# Patient Record
Sex: Female | Born: 1945 | Race: White | Hispanic: No | Smoking: Current every day smoker
Health system: Southern US, Community
[De-identification: ages and names within clinical notes are randomized; demographics above are authoritative.]

## PROBLEM LIST (undated history)

## (undated) DIAGNOSIS — M8580 Other specified disorders of bone density and structure, unspecified site: Secondary | ICD-10-CM

## (undated) DIAGNOSIS — K5730 Diverticulosis of large intestine without perforation or abscess without bleeding: Secondary | ICD-10-CM

## (undated) DIAGNOSIS — M1990 Unspecified osteoarthritis, unspecified site: Secondary | ICD-10-CM

## (undated) DIAGNOSIS — J449 Chronic obstructive pulmonary disease, unspecified: Secondary | ICD-10-CM

## (undated) DIAGNOSIS — Z01419 Encounter for gynecological examination (general) (routine) without abnormal findings: Secondary | ICD-10-CM

## (undated) DIAGNOSIS — K635 Polyp of colon: Secondary | ICD-10-CM

## (undated) DIAGNOSIS — T7840XA Allergy, unspecified, initial encounter: Secondary | ICD-10-CM

## (undated) DIAGNOSIS — Z5189 Encounter for other specified aftercare: Secondary | ICD-10-CM

## (undated) DIAGNOSIS — F419 Anxiety disorder, unspecified: Secondary | ICD-10-CM

## (undated) DIAGNOSIS — E785 Hyperlipidemia, unspecified: Secondary | ICD-10-CM

## (undated) DIAGNOSIS — F32A Depression, unspecified: Secondary | ICD-10-CM

## (undated) DIAGNOSIS — K219 Gastro-esophageal reflux disease without esophagitis: Secondary | ICD-10-CM

## (undated) DIAGNOSIS — F329 Major depressive disorder, single episode, unspecified: Secondary | ICD-10-CM

## (undated) HISTORY — DX: Hyperlipidemia, unspecified: E78.5

## (undated) HISTORY — DX: Encounter for gynecological examination (general) (routine) without abnormal findings: Z01.419

## (undated) HISTORY — DX: Anxiety disorder, unspecified: F41.9

## (undated) HISTORY — DX: Diverticulosis of large intestine without perforation or abscess without bleeding: K57.30

## (undated) HISTORY — DX: Other specified disorders of bone density and structure, unspecified site: M85.80

## (undated) HISTORY — DX: Polyp of colon: K63.5

## (undated) HISTORY — DX: Encounter for other specified aftercare: Z51.89

## (undated) HISTORY — PX: POLYPECTOMY: SHX149

## (undated) HISTORY — DX: Gastro-esophageal reflux disease without esophagitis: K21.9

## (undated) HISTORY — DX: Allergy, unspecified, initial encounter: T78.40XA

## (undated) HISTORY — DX: Unspecified osteoarthritis, unspecified site: M19.90

## (undated) HISTORY — DX: Depression, unspecified: F32.A

## (undated) HISTORY — DX: Major depressive disorder, single episode, unspecified: F32.9

---

## 1999-03-26 ENCOUNTER — Other Ambulatory Visit: Admission: RE | Admit: 1999-03-26 | Discharge: 1999-03-26 | Payer: Self-pay | Admitting: Obstetrics & Gynecology

## 2000-06-15 ENCOUNTER — Other Ambulatory Visit: Admission: RE | Admit: 2000-06-15 | Discharge: 2000-06-15 | Payer: Self-pay | Admitting: Obstetrics & Gynecology

## 2001-11-16 ENCOUNTER — Encounter: Admission: RE | Admit: 2001-11-16 | Discharge: 2001-11-16 | Payer: Self-pay | Admitting: Obstetrics & Gynecology

## 2001-11-16 ENCOUNTER — Encounter: Payer: Self-pay | Admitting: Obstetrics & Gynecology

## 2002-01-02 ENCOUNTER — Other Ambulatory Visit: Admission: RE | Admit: 2002-01-02 | Discharge: 2002-01-02 | Payer: Self-pay | Admitting: Obstetrics & Gynecology

## 2002-12-14 ENCOUNTER — Encounter: Admission: RE | Admit: 2002-12-14 | Discharge: 2002-12-14 | Payer: Self-pay | Admitting: Obstetrics & Gynecology

## 2002-12-14 ENCOUNTER — Encounter: Payer: Self-pay | Admitting: Obstetrics & Gynecology

## 2003-01-17 ENCOUNTER — Other Ambulatory Visit: Admission: RE | Admit: 2003-01-17 | Discharge: 2003-01-17 | Payer: Self-pay | Admitting: Obstetrics & Gynecology

## 2003-12-16 ENCOUNTER — Other Ambulatory Visit: Admission: RE | Admit: 2003-12-16 | Discharge: 2003-12-16 | Payer: Self-pay | Admitting: *Deleted

## 2004-01-03 ENCOUNTER — Encounter: Admission: RE | Admit: 2004-01-03 | Discharge: 2004-01-03 | Payer: Self-pay | Admitting: *Deleted

## 2005-01-07 ENCOUNTER — Other Ambulatory Visit: Admission: RE | Admit: 2005-01-07 | Discharge: 2005-01-07 | Payer: Self-pay | Admitting: *Deleted

## 2005-01-28 ENCOUNTER — Encounter: Admission: RE | Admit: 2005-01-28 | Discharge: 2005-01-28 | Payer: Self-pay | Admitting: *Deleted

## 2006-02-25 ENCOUNTER — Encounter: Admission: RE | Admit: 2006-02-25 | Discharge: 2006-02-25 | Payer: Self-pay | Admitting: *Deleted

## 2006-07-07 ENCOUNTER — Ambulatory Visit: Payer: Self-pay | Admitting: Family Medicine

## 2006-07-07 ENCOUNTER — Encounter: Admission: RE | Admit: 2006-07-07 | Discharge: 2006-07-07 | Payer: Self-pay | Admitting: Family Medicine

## 2006-07-12 ENCOUNTER — Ambulatory Visit: Payer: Self-pay | Admitting: Family Medicine

## 2007-04-03 ENCOUNTER — Other Ambulatory Visit: Admission: RE | Admit: 2007-04-03 | Discharge: 2007-04-03 | Payer: Self-pay | Admitting: *Deleted

## 2007-04-13 ENCOUNTER — Encounter: Admission: RE | Admit: 2007-04-13 | Discharge: 2007-04-13 | Payer: Self-pay | Admitting: *Deleted

## 2007-07-04 ENCOUNTER — Ambulatory Visit: Payer: Self-pay | Admitting: Family Medicine

## 2007-07-04 DIAGNOSIS — K219 Gastro-esophageal reflux disease without esophagitis: Secondary | ICD-10-CM | POA: Insufficient documentation

## 2007-07-06 ENCOUNTER — Telehealth: Payer: Self-pay | Admitting: Family Medicine

## 2007-08-17 ENCOUNTER — Ambulatory Visit: Payer: Self-pay | Admitting: Family Medicine

## 2007-08-17 LAB — CONVERTED CEMR LAB
Bilirubin Urine: NEGATIVE
Glucose, Urine, Semiquant: NEGATIVE
Ketones, urine, test strip: NEGATIVE
Nitrite: NEGATIVE
Protein, U semiquant: NEGATIVE
Specific Gravity, Urine: 1.02
Urobilinogen, UA: 0.2
pH: 6

## 2007-08-18 LAB — CONVERTED CEMR LAB
ALT: 9 units/L (ref 0–35)
AST: 14 units/L (ref 0–37)
Albumin: 4 g/dL (ref 3.5–5.2)
Alkaline Phosphatase: 86 units/L (ref 39–117)
BUN: 14 mg/dL (ref 6–23)
Basophils Absolute: 0 10*3/uL (ref 0.0–0.1)
Basophils Relative: 0.2 % (ref 0.0–1.0)
Bilirubin, Direct: 0.1 mg/dL (ref 0.0–0.3)
CO2: 27 meq/L (ref 19–32)
Calcium: 9.5 mg/dL (ref 8.4–10.5)
Chloride: 108 meq/L (ref 96–112)
Cholesterol: 281 mg/dL (ref 0–200)
Creatinine, Ser: 1 mg/dL (ref 0.4–1.2)
Direct LDL: 208.4 mg/dL
Eosinophils Absolute: 0.2 10*3/uL (ref 0.0–0.6)
Eosinophils Relative: 3 % (ref 0.0–5.0)
GFR calc Af Amer: 73 mL/min
GFR calc non Af Amer: 60 mL/min
Glucose, Bld: 95 mg/dL (ref 70–99)
HCT: 42.3 % (ref 36.0–46.0)
HDL: 33.2 mg/dL — ABNORMAL LOW (ref >39.0)
Hemoglobin: 14.4 g/dL (ref 12.0–15.0)
Lymphocytes Relative: 36.1 % (ref 12.0–46.0)
MCHC: 34 g/dL (ref 30.0–36.0)
MCV: 93.7 fL (ref 78.0–100.0)
Monocytes Absolute: 0.7 10*3/uL (ref 0.2–0.7)
Monocytes Relative: 9.1 % (ref 3.0–11.0)
Neutro Abs: 4.3 10*3/uL (ref 1.4–7.7)
Neutrophils Relative %: 51.6 % (ref 43.0–77.0)
Platelets: 244 10*3/uL (ref 150–400)
Potassium: 3.8 meq/L (ref 3.5–5.1)
RBC: 4.52 M/uL (ref 3.87–5.11)
RDW: 12.4 % (ref 11.5–14.6)
Sodium: 140 meq/L (ref 135–145)
TSH: 3.17 microintl units/mL (ref 0.35–5.50)
Total Bilirubin: 0.7 mg/dL (ref 0.3–1.2)
Total CHOL/HDL Ratio: 8.5
Total Protein: 6.2 g/dL (ref 6.0–8.3)
Triglycerides: 223 mg/dL (ref 0–149)
VLDL: 45 mg/dL — ABNORMAL HIGH (ref 0–40)
WBC: 8.2 10*3/uL (ref 4.5–10.5)

## 2007-08-22 ENCOUNTER — Ambulatory Visit: Payer: Self-pay | Admitting: Family Medicine

## 2007-08-22 DIAGNOSIS — F339 Major depressive disorder, recurrent, unspecified: Secondary | ICD-10-CM | POA: Insufficient documentation

## 2007-08-22 DIAGNOSIS — F329 Major depressive disorder, single episode, unspecified: Secondary | ICD-10-CM

## 2007-09-11 ENCOUNTER — Ambulatory Visit: Payer: Self-pay | Admitting: Gastroenterology

## 2007-09-25 ENCOUNTER — Encounter: Payer: Self-pay | Admitting: Gastroenterology

## 2007-09-25 ENCOUNTER — Ambulatory Visit: Payer: Self-pay | Admitting: Gastroenterology

## 2007-09-25 ENCOUNTER — Encounter: Payer: Self-pay | Admitting: Internal Medicine

## 2007-09-25 ENCOUNTER — Encounter: Payer: Self-pay | Admitting: Family Medicine

## 2007-09-25 DIAGNOSIS — K5730 Diverticulosis of large intestine without perforation or abscess without bleeding: Secondary | ICD-10-CM

## 2007-09-25 DIAGNOSIS — K635 Polyp of colon: Secondary | ICD-10-CM

## 2007-09-25 HISTORY — DX: Diverticulosis of large intestine without perforation or abscess without bleeding: K57.30

## 2007-09-25 HISTORY — DX: Polyp of colon: K63.5

## 2007-11-15 ENCOUNTER — Ambulatory Visit: Payer: Self-pay | Admitting: Family Medicine

## 2008-01-03 ENCOUNTER — Ambulatory Visit: Payer: Self-pay | Admitting: Family Medicine

## 2008-01-03 LAB — CONVERTED CEMR LAB
ALT: 9 units/L (ref 0–35)
AST: 14 units/L (ref 0–37)
Albumin: 3.9 g/dL (ref 3.5–5.2)
Alkaline Phosphatase: 82 units/L (ref 39–117)
Bilirubin, Direct: 0.2 mg/dL (ref 0.0–0.3)
Cholesterol: 178 mg/dL (ref 0–200)
HDL: 33.2 mg/dL — ABNORMAL LOW (ref >39.0)
LDL Cholesterol: 127 mg/dL — ABNORMAL HIGH (ref 0–99)
Total Bilirubin: 0.5 mg/dL (ref 0.3–1.2)
Total CHOL/HDL Ratio: 5.4
Total Protein: 6.3 g/dL (ref 6.0–8.3)
Triglycerides: 89 mg/dL (ref 0–149)
VLDL: 18 mg/dL (ref 0–40)

## 2008-04-01 ENCOUNTER — Ambulatory Visit: Payer: Self-pay | Admitting: Family Medicine

## 2008-04-04 LAB — CONVERTED CEMR LAB
ALT: 9 units/L (ref 0–35)
AST: 12 units/L (ref 0–37)
Albumin: 3.7 g/dL (ref 3.5–5.2)
Alkaline Phosphatase: 74 units/L (ref 39–117)
Bilirubin, Direct: 0.1 mg/dL (ref 0.0–0.3)
Cholesterol: 168 mg/dL (ref 0–200)
HDL: 33.1 mg/dL — ABNORMAL LOW (ref >39.0)
LDL Cholesterol: 112 mg/dL — ABNORMAL HIGH (ref 0–99)
Total Bilirubin: 0.6 mg/dL (ref 0.3–1.2)
Total CHOL/HDL Ratio: 5.1
Total Protein: 6 g/dL (ref 6.0–8.3)
Triglycerides: 113 mg/dL (ref 0–149)
VLDL: 23 mg/dL (ref 0–40)

## 2008-04-17 ENCOUNTER — Encounter: Admission: RE | Admit: 2008-04-17 | Discharge: 2008-04-17 | Payer: Self-pay | Admitting: Gynecology

## 2008-07-16 ENCOUNTER — Other Ambulatory Visit: Admission: RE | Admit: 2008-07-16 | Discharge: 2008-07-16 | Payer: Self-pay | Admitting: Gynecology

## 2008-07-19 ENCOUNTER — Ambulatory Visit: Payer: Self-pay | Admitting: Family Medicine

## 2009-04-30 ENCOUNTER — Encounter: Admission: RE | Admit: 2009-04-30 | Discharge: 2009-04-30 | Payer: Self-pay | Admitting: Gynecology

## 2009-07-25 ENCOUNTER — Ambulatory Visit: Payer: Self-pay | Admitting: Family Medicine

## 2009-07-25 ENCOUNTER — Telehealth: Payer: Self-pay | Admitting: Family Medicine

## 2009-07-28 LAB — CONVERTED CEMR LAB
Bilirubin Urine: NEGATIVE
Glucose, Urine, Semiquant: NEGATIVE
Ketones, urine, test strip: NEGATIVE
Nitrite: NEGATIVE
Protein, U semiquant: NEGATIVE
Specific Gravity, Urine: 1.02
Urobilinogen, UA: 0.2
pH: 6

## 2009-07-29 LAB — CONVERTED CEMR LAB
ALT: 10 units/L (ref 0–35)
AST: 16 units/L (ref 0–37)
Albumin: 4.3 g/dL (ref 3.5–5.2)
Alkaline Phosphatase: 83 units/L (ref 39–117)
BUN: 16 mg/dL (ref 6–23)
Basophils Absolute: 0 10*3/uL (ref 0.0–0.1)
Basophils Relative: 0.1 % (ref 0.0–3.0)
Bilirubin, Direct: 0 mg/dL (ref 0.0–0.3)
CO2: 29 meq/L (ref 19–32)
Calcium: 9.6 mg/dL (ref 8.4–10.5)
Chloride: 111 meq/L (ref 96–112)
Cholesterol: 194 mg/dL (ref 0–200)
Creatinine, Ser: 1 mg/dL (ref 0.4–1.2)
Eosinophils Absolute: 0.2 10*3/uL (ref 0.0–0.7)
Eosinophils Relative: 2.1 % (ref 0.0–5.0)
GFR calc non Af Amer: 59.9 mL/min (ref >60)
Glucose, Bld: 98 mg/dL (ref 70–99)
HCT: 43.4 % (ref 36.0–46.0)
HDL: 39 mg/dL — ABNORMAL LOW (ref >39.00)
Hemoglobin: 14.9 g/dL (ref 12.0–15.0)
LDL Cholesterol: 118 mg/dL — ABNORMAL HIGH (ref 0–99)
Lymphocytes Relative: 34.6 % (ref 12.0–46.0)
Lymphs Abs: 3 10*3/uL (ref 0.7–4.0)
MCHC: 34.4 g/dL (ref 30.0–36.0)
MCV: 94.4 fL (ref 78.0–100.0)
Monocytes Absolute: 0.7 10*3/uL (ref 0.1–1.0)
Monocytes Relative: 8.1 % (ref 3.0–12.0)
Neutro Abs: 4.9 10*3/uL (ref 1.4–7.7)
Neutrophils Relative %: 55.1 % (ref 43.0–77.0)
Platelets: 221 10*3/uL (ref 150.0–400.0)
Potassium: 4.1 meq/L (ref 3.5–5.1)
RBC: 4.6 M/uL (ref 3.87–5.11)
RDW: 12.3 % (ref 11.5–14.6)
Sodium: 144 meq/L (ref 135–145)
TSH: 3.36 microintl units/mL (ref 0.35–5.50)
Total Bilirubin: 0.9 mg/dL (ref 0.3–1.2)
Total CHOL/HDL Ratio: 5
Total Protein: 7 g/dL (ref 6.0–8.3)
Triglycerides: 185 mg/dL — ABNORMAL HIGH (ref 0.0–149.0)
VLDL: 37 mg/dL (ref 0.0–40.0)
WBC: 8.8 10*3/uL (ref 4.5–10.5)

## 2009-08-05 ENCOUNTER — Ambulatory Visit: Payer: Self-pay | Admitting: Family Medicine

## 2009-08-06 ENCOUNTER — Telehealth: Payer: Self-pay | Admitting: Family Medicine

## 2010-02-23 ENCOUNTER — Telehealth (INDEPENDENT_AMBULATORY_CARE_PROVIDER_SITE_OTHER): Payer: Self-pay | Admitting: *Deleted

## 2010-07-07 ENCOUNTER — Telehealth (INDEPENDENT_AMBULATORY_CARE_PROVIDER_SITE_OTHER): Payer: Self-pay

## 2010-08-28 ENCOUNTER — Ambulatory Visit: Payer: Self-pay | Admitting: Family Medicine

## 2010-08-28 LAB — CONVERTED CEMR LAB
Bilirubin Urine: NEGATIVE
Glucose, Urine, Semiquant: NEGATIVE
Ketones, urine, test strip: NEGATIVE
Nitrite: NEGATIVE
Protein, U semiquant: NEGATIVE
Specific Gravity, Urine: 1.02
Urobilinogen, UA: 0.2
WBC Urine, dipstick: NEGATIVE
pH: 6

## 2010-08-31 LAB — CONVERTED CEMR LAB
ALT: 8 units/L (ref 0–35)
AST: 14 units/L (ref 0–37)
Albumin: 4.2 g/dL (ref 3.5–5.2)
Alkaline Phosphatase: 75 units/L (ref 39–117)
BUN: 16 mg/dL (ref 6–23)
Basophils Absolute: 0 10*3/uL (ref 0.0–0.1)
Basophils Relative: 0.5 % (ref 0.0–3.0)
Bilirubin, Direct: 0.1 mg/dL (ref 0.0–0.3)
CO2: 26 meq/L (ref 19–32)
Calcium: 9.4 mg/dL (ref 8.4–10.5)
Chloride: 104 meq/L (ref 96–112)
Cholesterol: 181 mg/dL (ref 0–200)
Creatinine, Ser: 0.9 mg/dL (ref 0.4–1.2)
Eosinophils Absolute: 0.1 10*3/uL (ref 0.0–0.7)
Eosinophils Relative: 1.8 % (ref 0.0–5.0)
GFR calc non Af Amer: 64.1 mL/min (ref >60)
Glucose, Bld: 84 mg/dL (ref 70–99)
HCT: 42.5 % (ref 36.0–46.0)
HDL: 37.7 mg/dL — ABNORMAL LOW (ref >39.00)
Hemoglobin: 14.4 g/dL (ref 12.0–15.0)
LDL Cholesterol: 111 mg/dL — ABNORMAL HIGH (ref 0–99)
Lymphocytes Relative: 31.4 % (ref 12.0–46.0)
Lymphs Abs: 2.6 10*3/uL (ref 0.7–4.0)
MCHC: 33.9 g/dL (ref 30.0–36.0)
MCV: 96.6 fL (ref 78.0–100.0)
Monocytes Absolute: 0.6 10*3/uL (ref 0.1–1.0)
Monocytes Relative: 7.8 % (ref 3.0–12.0)
Neutro Abs: 4.8 10*3/uL (ref 1.4–7.7)
Neutrophils Relative %: 58.5 % (ref 43.0–77.0)
Platelets: 217 10*3/uL (ref 150.0–400.0)
Potassium: 4.2 meq/L (ref 3.5–5.1)
RBC: 4.4 M/uL (ref 3.87–5.11)
RDW: 13.8 % (ref 11.5–14.6)
Sodium: 139 meq/L (ref 135–145)
TSH: 2.48 microintl units/mL (ref 0.35–5.50)
Total Bilirubin: 0.7 mg/dL (ref 0.3–1.2)
Total CHOL/HDL Ratio: 5
Total Protein: 6.5 g/dL (ref 6.0–8.3)
Triglycerides: 161 mg/dL — ABNORMAL HIGH (ref 0.0–149.0)
VLDL: 32.2 mg/dL (ref 0.0–40.0)
WBC: 8.2 10*3/uL (ref 4.5–10.5)

## 2010-09-09 ENCOUNTER — Ambulatory Visit: Payer: Self-pay | Admitting: Family Medicine

## 2010-09-09 ENCOUNTER — Encounter: Payer: Self-pay | Admitting: Family Medicine

## 2010-09-10 ENCOUNTER — Encounter: Payer: Self-pay | Admitting: Family Medicine

## 2010-09-14 ENCOUNTER — Telehealth: Payer: Self-pay | Admitting: Family Medicine

## 2011-01-05 NOTE — Assessment & Plan Note (Signed)
Summary: CPX-NO PAP//CCM   Vital Signs:  Patient profile:   65 year old female Height:      63.75 inches Weight:      183 pounds BMI:     31.77 Temp:     98.7 degrees F oral Pulse rate:   99 / minute BP sitting:   124 / 90  (left arm) Cuff size:   regular  Vitals Entered By: Alfred Levins, CMA (August 05, 2009 1:23 PM) CC: cpx, no pap   History of Present Illness: 65 yr old female for cpx. Has some questions, mostly about feeling tight and sore around the shoulders and neck. This can keep her awake at night, and sometimes she gets HAs at the back of the head. She has felt a bit mor anxious and depressed lately. Looking at her med list, I think she is taking a 12 hour version of Wellbutrin only once a day.  Allergies (verified): No Known Drug Allergies  Past History:  Past Medical History: GERD Hyperlipidemia Depression sees Dr. Chevis Pretty for GYN exams  Past Surgical History: Denies surgical history colonoscopy 09-25-07 per Dr. Jarold Motto, multiple polyps, repeat in 4 years.   Family History: Reviewed history from 08/22/2007 and no changes required. Family History Breast cancer 1st degree relative <50 Family History of Colon CA 1st degree relative <60 Family History of Stroke F 1st degree relative <60 kidney stones  Social History: Reviewed history from 08/22/2007 and no changes required. Occupation: receptionist Married Current Smoker Alcohol use-yes  Review of Systems  The patient denies anorexia, fever, weight loss, weight gain, vision loss, decreased hearing, hoarseness, chest pain, syncope, dyspnea on exertion, peripheral edema, prolonged cough, headaches, hemoptysis, abdominal pain, melena, hematochezia, severe indigestion/heartburn, hematuria, incontinence, genital sores, muscle weakness, suspicious skin lesions, transient blindness, difficulty walking, unusual weight change, abnormal bleeding, enlarged lymph nodes, angioedema, breast masses, and testicular masses.     Physical Exam  General:  overweight-appearing.   Head:  Normocephalic and atraumatic without obvious abnormalities. No apparent alopecia or balding. Eyes:  No corneal or conjunctival inflammation noted. EOMI. Perrla. Funduscopic exam benign, without hemorrhages, exudates or papilledema. Vision grossly normal. Ears:  External ear exam shows no significant lesions or deformities.  Otoscopic examination reveals clear canals, tympanic membranes are intact bilaterally without bulging, retraction, inflammation or discharge. Hearing is grossly normal bilaterally. Nose:  External nasal examination shows no deformity or inflammation. Nasal mucosa are pink and moist without lesions or exudates. Mouth:  Oral mucosa and oropharynx without lesions or exudates.  Teeth in good repair. Neck:  No deformities, masses, or tenderness noted. Chest Wall:  No deformities, masses, or tenderness noted. Lungs:  Normal respiratory effort, chest expands symmetrically. Lungs are clear to auscultation, no crackles or wheezes. Heart:  Normal rate and regular rhythm. S1 and S2 normal without gallop, murmur, click, rub or other extra sounds. EKG normal Abdomen:  Bowel sounds positive,abdomen soft and non-tender without masses, organomegaly or hernias noted. Msk:  No deformity or scoliosis noted of thoracic or lumbar spine.   Pulses:  R and L carotid,radial,femoral,dorsalis pedis and posterior tibial pulses are full and equal bilaterally Extremities:  No clubbing, cyanosis, edema, or deformity noted with normal full range of motion of all joints.   Neurologic:  No cranial nerve deficits noted. Station and gait are normal. Plantar reflexes are down-going bilaterally. DTRs are symmetrical throughout. Sensory, motor and coordinative functions appear intact. Skin:  Intact without suspicious lesions or rashes Cervical Nodes:  No lymphadenopathy noted Axillary Nodes:  No palpable lymphadenopathy Inguinal Nodes:  No significant  adenopathy Psych:  Cognition and judgment appear intact. Alert and cooperative with normal attention span and concentration. No apparent delusions, illusions, hallucinations   Impression & Recommendations:  Problem # 1:  EXAMINATION, ROUTINE MEDICAL (ICD-V70.0)  Orders: EKG w/ Interpretation (93000)  Complete Medication List: 1)  Multivitamins Tabs (Multiple vitamin) .Marland Kitchen.. 1 by mouth once daily 2)  Zocor 80 Mg Tabs (Simvastatin) .Marland Kitchen.. 1 by mouth once daily 3)  Bupropion Hcl 150 Mg Xr24h-tab (Bupropion hcl) .... Once daily  Patient Instructions: 1)  Increase Wellbutrin to a full 24 hour dosing.  2)  Please schedule a follow-up appointment as needed .  Prescriptions: BUPROPION HCL 150 MG XR24H-TAB (BUPROPION HCL) once daily  #30 x 11   Entered and Authorized by:   [name] MD   Signed by:   [name] MD on 08/05/2009   Method used:   Electronically to        [facility] # [phone]* (retail)       [address]       [place], [place]  [address]       Ph: [phone]       Fax: [phone]   RxID:   [id number] ZOCOR 80 MG TABS (SIMVASTATIN) 1 by mouth once daily  #30 x 11   Entered and Authorized by:   [name] MD   Signed by:   [name] MD on 08/05/2009   Method used:   Electronically to        [facility] # [phone]* (retail)       [address]       [place], [place]  [address]       Ph: [phone]       Fax: [phone]   RxID:   [id number]

## 2011-01-05 NOTE — Progress Notes (Signed)
Summary: refill   Phone Note Refill Request Message from:  Patient--live call  Refills Requested: Medication #1:  BUPROPION HCL 150 MG XR24H-TAB once daily   Dosage confirmed as above?Dosage Confirmed refill at [facility]. cpx is on 08-18-2010  Initial call taken by: Warnell Forester,  July 07, 2010 10:18 AM    Prescriptions: BUPROPION HCL 150 MG XR24H-TAB (BUPROPION HCL) once daily  #30 x 1   Entered by:   Kathrynn Speed CMA   Authorized by:   [name] MD   Signed by:   Kathrynn Speed CMA on 07/07/2010   Method used:   Electronically to        [facility] # [phone]* (retail)       [address]       [place], [place]  [address]       Ph: [phone]       Fax: [phone]   RxID:   [id number]

## 2011-01-05 NOTE — Procedures (Signed)
Summary: colonoscopy  colonoscopy   Imported By: Kassie Mends 10/06/2007 15:21:04  _____________________________________________________________________  External Attachment:    Type:   Image     Comment:   colonoscopy

## 2011-01-05 NOTE — Assessment & Plan Note (Signed)
Summary: CPX/LAB   Vital Signs:  Patient Profile:   65 Years Old Female Height:     64.5 inches (163.83 cm) Weight:      187 pounds (85 kg) Temp:     98.9 degrees F (37.17 degrees C) oral Pulse rate:   100 / minute Pulse rhythm:   regular BP sitting:   122 / 76  (left arm) Cuff size:   regular  Vitals Entered By: Alfred Levins, CMA (August 22, 2007 8:58 AM)                 Chief Complaint:  cpx and no pap.  History of Present Illness: 65 yr old female for cpx. Sees Dr. Randell Patient for gyn exams. Doing well. Started Simvastatin for chol. and takes Omeprazole for GERD. This helps, but still gets breakthrough reflux at times. Has been on Wellbutrin for 2 months for depression. It helps some, but she would like to up the dose.  Current Allergies: No known allergies   Past Medical History:    Reviewed history from 07/04/2007 and no changes required:       GERD       Hyperlipidemia       Depression  Past Surgical History:    Reviewed history from 07/04/2007 and no changes required:       Denies surgical history   Family History:    Reviewed history and no changes required:       Family History Breast cancer 1st degree relative <50       Family History of Colon CA 1st degree relative <60       Family History of Stroke F 1st degree relative <60       kidney stones  Social History:    Reviewed history and no changes required:       Occupation: receptionist       Married       Current Smoker       Alcohol use-yes   Risk Factors:  Tobacco use:  current    Cigarettes:  Yes -- 1/2 pack(s) per day Alcohol use:  yes   Review of Systems  The patient denies anorexia, fever, weight loss, vision loss, decreased hearing, hoarseness, chest pain, syncope, dyspnea on exhertion, peripheral edema, prolonged cough, hemoptysis, abdominal pain, melena, hematochezia, severe indigestion/heartburn, hematuria, incontinence, genital sores, muscle weakness, suspicious skin lesions,  transient blindness, difficulty walking, depression, unusual weight change, abnormal bleeding, enlarged lymph nodes, angioedema, breast masses, and testicular masses.     Physical Exam  General:     overweight-appearing.   Head:     Normocephalic and atraumatic without obvious abnormalities. No apparent alopecia or balding. Eyes:     No corneal or conjunctival inflammation noted. EOMI. Perrla. Funduscopic exam benign, without hemorrhages, exudates or papilledema. Vision grossly normal. Ears:     External ear exam shows no significant lesions or deformities.  Otoscopic examination reveals clear canals, tympanic membranes are intact bilaterally without bulging, retraction, inflammation or discharge. Hearing is grossly normal bilaterally. Nose:     External nasal examination shows no deformity or inflammation. Nasal mucosa are pink and moist without lesions or exudates. Mouth:     Oral mucosa and oropharynx without lesions or exudates.  Teeth in good repair. Neck:     No deformities, masses, or tenderness noted. Chest Wall:     No deformities, masses, or tenderness noted. Lungs:     Normal respiratory effort, chest expands symmetrically. Lungs are clear to auscultation, no  crackles or wheezes. Heart:     Normal rate and regular rhythm. S1 and S2 normal without gallop, murmur, click, rub or other extra sounds.EKG normal Abdomen:     Bowel sounds positive,abdomen soft and non-tender without masses, organomegaly or hernias noted. Msk:     No deformity or scoliosis noted of thoracic or lumbar spine.   Pulses:     R and L carotid,radial,femoral,dorsalis pedis and posterior tibial pulses are full and equal bilaterally Extremities:     No clubbing, cyanosis, edema, or deformity noted with normal full range of motion of all joints.   Neurologic:     No cranial nerve deficits noted. Station and gait are normal. Plantar reflexes are down-going bilaterally. DTRs are symmetrical throughout.  Sensory, motor and coordinative functions appear intact. Skin:     Intact without suspicious lesions or rashes Cervical Nodes:     No lymphadenopathy noted Axillary Nodes:     No palpable lymphadenopathy Inguinal Nodes:     No significant adenopathy Psych:     Cognition and judgment appear intact. Alert and cooperative with normal attention span and concentration. No apparent delusions, illusions, hallucinations    Impression & Recommendations:  Problem # 1:  EXAMINATION, ROUTINE MEDICAL (ICD-V70.0)  Orders: EKG w/ Interpretation (93000) Gastroenterology Referral (GI)   Complete Medication List: 1)  Multivitamins Tabs (Multiple vitamin) .Marland Kitchen.. 1 by mouth once daily 2)  Zocor 40 Mg Tabs (Simvastatin) .Marland Kitchen.. 1 by mouth once daily 3)  Wellbutrin Sr 150 Mg Tb12 (Bupropion hcl) .... Once daily 4)  Omeprazole 20 Mg Cpdr (Omeprazole) .... 2 once daily   Patient Instructions: 1)  Please schedule a follow-up appointment in 3 months. 2)  Schedule a colonoscopy/sigmoidoscopy to help detect colon cancer.    Prescriptions: OMEPRAZOLE 20 MG  CPDR (OMEPRAZOLE) 2 once daily  #60 x 11   Entered and Authorized by:   [name] MD   Signed by:   [name] MD on 08/22/2007   Method used:   Electronically sent to ...       [facility] # [phone] [facility] [address]       [address]       [place], [place]  [address]       Ph: [phone]       Fax: [phone]   RxID:   [id number] WELLBUTRIN SR 150 MG  TB12 (BUPROPION HCL) once daily  #30 x 11   Entered and Authorized by:   [name] MD   Signed by:   [name] MD on 08/22/2007   Method used:   Electronically sent to ...       [facility] # [address]       [address]       [place], [place]  [address]       Ph: [phone]       Fax: [phone]   RxID:   [id number]  ]

## 2011-01-05 NOTE — Progress Notes (Signed)
Summary: RX REFILL: [facility]  Phone Note Call from Patient Call back at Home Phone [phone]   Caller: PT HERE FOR LAB APPT Summary of Call: NEED RX REFILL OF WELLBUTRIN SR 150MG  IS OUT AND NEEDS ENOUNGH UNTIL SHE COMES IN FOR HER CPX ON 08/05/09.  PLEASE SEND TO [facility] [address]. Initial call taken by: Trixie Dredge,  July 25, 2009 8:51 AM  Follow-up for Phone Call        Phone Call Completed, Rx Called In Follow-up by: Alfred Levins, CMA,  July 25, 2009 9:44 AM    Prescriptions: WELLBUTRIN SR 150 MG  TB12 (BUPROPION HCL) once daily  #30 x 0   Entered by:   Alfred Levins, CMA   Authorized by:   [name] MD   Signed by:   Alfred Levins, CMA on 07/25/2009   Method used:   Electronically to        [facility] # [phone]* (retail)       [address]       [place], [place]  [address]       Ph: [phone]       Fax: [phone]   RxID:   [id number]

## 2011-01-05 NOTE — Assessment & Plan Note (Signed)
Summary: sore throat/ccm   Vital Signs:  Patient Profile:   65 Years Old Female Height:     64.5 inches (163.83 cm) Weight:      179 pounds Temp:     98.6 degrees F oral Pulse rate:   80 / minute Pulse rhythm:   regular BP sitting:   118 / 82  (left arm) Cuff size:   regular  Vitals Entered By: Raechel Ache, RN (July 19, 2008 2:45 PM)                 Chief Complaint:  C/o sore throat, swollen glands, some congestion, and cough and achy. Started Tuesday.Marland Kitchen  History of Present Illness: 4 days of St, chest congestion, and dry cough. No fever.    Current Allergies (reviewed today): No known allergies   Past Medical History:    Reviewed history from 08/22/2007 and no changes required:       GERD       Hyperlipidemia       Depression     Review of Systems      See HPI   Physical Exam  General:     Well-developed,well-nourished,in no acute distress; alert,appropriate and cooperative throughout examination Head:     Normocephalic and atraumatic without obvious abnormalities. No apparent alopecia or balding. Eyes:     No corneal or conjunctival inflammation noted. EOMI. Perrla. Funduscopic exam benign, without hemorrhages, exudates or papilledema. Vision grossly normal. Ears:     External ear exam shows no significant lesions or deformities.  Otoscopic examination reveals clear canals, tympanic membranes are intact bilaterally without bulging, retraction, inflammation or discharge. Hearing is grossly normal bilaterally. Nose:     External nasal examination shows no deformity or inflammation. Nasal mucosa are pink and moist without lesions or exudates. Mouth:     OP red without exudate Neck:     No deformities, masses, or tenderness noted. Lungs:     Normal respiratory effort, chest expands symmetrically. Lungs are clear to auscultation, no crackles or wheezes.    Impression & Recommendations:  Problem # 1:  ACUTE BRONCHITIS (ICD-466.0)  Her updated  medication list for this problem includes:    Zithromax Z-pak 250 Mg Tabs (Azithromycin) .Marland Kitchen... As directed   Complete Medication List: 1)  Multivitamins Tabs (Multiple vitamin) .Marland Kitchen.. 1 by mouth once daily 2)  Zocor 80 Mg Tabs (Simvastatin) .Marland Kitchen.. 1 by mouth once daily 3)  Wellbutrin Sr 150 Mg Tb12 (Bupropion hcl) .... Once daily 4)  Omeprazole 20 Mg Cpdr (Omeprazole) .... 2 once daily 5)  Zithromax Z-pak 250 Mg Tabs (Azithromycin) .... As directed   Patient Instructions: 1)  Please schedule a follow-up appointment as needed.   Prescriptions: WELLBUTRIN SR 150 MG  TB12 (BUPROPION HCL) once daily  #30 x 11   Entered and Authorized by:   [name] MD   Signed by:   [name] MD on 07/19/2008   Method used:   Electronically sent to ...       [facility] # [phone]*       [address]       [place], [place]  [address]       Ph: [phone]       Fax: [phone]   RxID:   [id number] ZITHROMAX Z-PAK 250 MG  TABS (AZITHROMYCIN) as directed  #1 x 0   Entered and Authorized by:   [name] MD   Signed by:   [name] MD  on 07/19/2008   Method used:   Electronically sent to ...       [facility] # [phone]*       [address]       [place], [place]  [address]       Ph: [phone]       Fax: [phone]   RxID:   [id number]  ]

## 2011-01-05 NOTE — Progress Notes (Signed)
Summary: GERD  Phone Note Call from Patient   Call For: Emily Horton Summary of Call: Rx Nexium will cost me $500 for the 90days.  Pharmacist says I can take two Prilosec per day & get same results.  Is this true/OK .  If not can you suggest something else .  The GERD is very bad.  [facility] [place]/ NKDA . call back at [phone] Initial call taken by: Rudy Jew, RN,  July 06, 2007 9:25 AM  Follow-up for Phone Call        call in omeprazole 20mg , take 2 a day, #60 with 11 rf Follow-up by: [name] MD,  July 10, 2007 1:20 PM  Additional Follow-up for Phone Call Additional follow up Details #1::        Phone Call Completed, Rx Called In Additional Follow-up by: Alfred Levins, CMA,  July 10, 2007 3:07 PM

## 2011-01-05 NOTE — Progress Notes (Signed)
Summary: lipitor too expensive  Phone Note Call from Patient Call back at Home Phone [phone]   Caller: vm Call For: doct Annette Bertelson's nurse Summary of Call: Asking for Lipitor discount card or samples or something else/different med.  Cannot afford $375 for 90 day supply.   Initial call taken by: Rudy Jew, RN,  September 14, 2010 12:04 PM  Follow-up for Phone Call        No we do not have discount cards or samples, but it will go generic next month. may be she can just follow a strict diet until she gets it next month Follow-up by: [name] MD,  September 14, 2010 2:09 PM  Additional Follow-up for Phone Call Additional follow up Details #1::        Phone Call Completed Additional Follow-up by: Rudy Jew, RN,  September 14, 2010 2:18 PM

## 2011-01-05 NOTE — Assessment & Plan Note (Signed)
Summary: cpx//ccm/pt rescd//ccm   Vital Signs:  Patient profile:   65 year old female Height:      65 inches Weight:      185 pounds BMI:     30.90 O2 Sat:      98 % Temp:     97.9 degrees F Pulse rate:   80 / minute BP sitting:   124 / 82  (left arm)  Vitals Entered By: Pura Spice, RN (September 09, 2010 2:52 PM) CC: cpx no pap    History of Present Illness: 65 yr old female for a cpx. She feels well and has no concerns.   Allergies (verified): No Known Drug Allergies  Past History:  Past Medical History: Reviewed history from 08/05/2009 and no changes required. GERD Hyperlipidemia Depression sees Dr. Chevis Pretty for GYN exams  Past Surgical History: Reviewed history from 08/05/2009 and no changes required. Denies surgical history colonoscopy 09-25-07 per Dr. Jarold Motto, multiple polyps, repeat in 4 years.   Past History:  Care Management: Gynecology: Dr Chevis Pretty Gastroenterology:  Dr Jarold Motto   Family History: Reviewed history from 08/22/2007 and no changes required. Family History Breast cancer 1st degree relative <50 Family History of Colon CA 1st degree relative <60 Family History of Stroke F 1st degree relative <60 kidney stones  Social History: Reviewed history from 08/22/2007 and no changes required. Occupation: receptionist Married Current Smoker Alcohol use-yes  Review of Systems  The patient denies anorexia, fever, weight loss, weight gain, vision loss, decreased hearing, hoarseness, chest pain, syncope, dyspnea on exertion, peripheral edema, prolonged cough, headaches, hemoptysis, abdominal pain, melena, hematochezia, severe indigestion/heartburn, hematuria, incontinence, genital sores, muscle weakness, suspicious skin lesions, transient blindness, difficulty walking, depression, unusual weight change, abnormal bleeding, enlarged lymph nodes, angioedema, breast masses, and testicular masses.    Physical Exam  General:   Well-developed,well-nourished,in no acute distress; alert,appropriate and cooperative throughout examination Head:  Normocephalic and atraumatic without obvious abnormalities. No apparent alopecia or balding. Eyes:  No corneal or conjunctival inflammation noted. EOMI. Perrla. Funduscopic exam benign, without hemorrhages, exudates or papilledema. Vision grossly normal. Ears:  External ear exam shows no significant lesions or deformities.  Otoscopic examination reveals clear canals, tympanic membranes are intact bilaterally without bulging, retraction, inflammation or discharge. Hearing is grossly normal bilaterally. Nose:  External nasal examination shows no deformity or inflammation. Nasal mucosa are pink and moist without lesions or exudates. Mouth:  Oral mucosa and oropharynx without lesions or exudates.  Teeth in good repair. Neck:  No deformities, masses, or tenderness noted. Chest Wall:  No deformities, masses, or tenderness noted. Lungs:  Normal respiratory effort, chest expands symmetrically. Lungs are clear to auscultation, no crackles or wheezes. Heart:  Normal rate and regular rhythm. S1 and S2 normal without gallop, murmur, click, rub or other extra sounds. EKG normal Abdomen:  Bowel sounds positive,abdomen soft and non-tender without masses, organomegaly or hernias noted. Msk:  No deformity or scoliosis noted of thoracic or lumbar spine.   Pulses:  R and L carotid,radial,femoral,dorsalis pedis and posterior tibial pulses are full and equal bilaterally Extremities:  No clubbing, cyanosis, edema, or deformity noted with normal full range of motion of all joints.   Neurologic:  No cranial nerve deficits noted. Station and gait are normal. Plantar reflexes are down-going bilaterally. DTRs are symmetrical throughout. Sensory, motor and coordinative functions appear intact. Skin:  Intact without suspicious lesions or rashes Cervical Nodes:  No lymphadenopathy noted Axillary Nodes:  No palpable  lymphadenopathy Inguinal Nodes:  No significant  adenopathy Psych:  Cognition and judgment appear intact. Alert and cooperative with normal attention span and concentration. No apparent delusions, illusions, hallucinations   Impression & Recommendations:  Problem # 1:  EXAMINATION, ROUTINE MEDICAL (ICD-V70.0)  Orders: EKG w/ Interpretation (93000)  Complete Medication List: 1)  Multivitamins Tabs (Multiple vitamin) .Marland Kitchen.. 1 by mouth once daily 2)  Bupropion Hcl 150 Mg Xr24h-tab (Bupropion hcl) .... Once daily 3)  Flexeril 10 Mg Tabs (Cyclobenzaprine hcl) .Marland Kitchen.. 1 by mouth three times a day as needed for spasm 4)  Lipitor 40 Mg Tabs (Atorvastatin calcium) .... Once daily 5)  Zoloft 50 Mg Tabs (Sertraline hcl) .... Once daily  Patient Instructions: 1)  It is important that you exercise reguarly at least 20 minutes 5 times a week. If you develop chest pain, have severe difficulty breathing, or feel very tired, stop exercising immediately and seek medical attention.  2)  You need to lose weight. Consider a lower calorie diet and regular exercise.  3)  Switch from Zocor to Lipitor, and recheck labs in 90 days. 4)  Add Zoloft to Wellbutrin daily.  Prescriptions: ZOLOFT 50 MG TABS (SERTRALINE HCL) once daily  #90 x 3   Entered and Authorized by:   [name] MD   Signed by:   [name] MD on 09/09/2010   Method used:   Print then Give to Patient   RxID:   [id number] FLEXERIL 10 MG TABS (CYCLOBENZAPRINE HCL) 1 by mouth three times a day as needed for spasm  #180 x 3   Entered and Authorized by:   [name] MD   Signed by:   [name] MD on 09/09/2010   Method used:   Print then Give to Patient   RxID:   [id number] BUPROPION HCL 150 MG XR24H-TAB (BUPROPION HCL) once daily  #90 x 3   Entered and Authorized by:   [name] MD   Signed by:   [name] MD on 09/09/2010   Method used:   Print then Give to Patient   RxID:   [id number] LIPITOR 40 MG TABS  (ATORVASTATIN CALCIUM) once daily  #90 x 3   Entered and Authorized by:   [name] MD   Signed by:   [name] MD on 09/09/2010   Method used:   Print then Give to Patient   RxID:   [id number]

## 2011-01-05 NOTE — Progress Notes (Signed)
Summary: Call-A-Nurse Report   Call-A-Nurse Triage Call Report Triage Record Num: [id number] Operator: Lelon Perla Patient Name: Emily Horton Call Date & Time: 02/21/2010 1:52:10AM Patient Phone: [phone] PCP: Patient Gender: Female PCP Fax : Patient DOB: [birth year] Practice Name: [facility] Reason for Call: pt calling with chest pain since 9 pm and has had hart burn before but this is worse - if she lays down it is worse and is having hot flashes and has twiching in her shoulder and if she takes a deep breath it hurts in the center of her chest - care advice given and advised to go to [facility] ED Protocol(s) Used: Chest Pain / Discomfort Recommended Outcome per Protocol: See ED Immediately Reason for Outcome: Symptoms worse when lying down AND better when sitting and leaning forward Care Advice:  ~ Another adult should drive. Write down provider's name. List or place the following in a bag for transport with the patient: current prescription and/or OTC medications; alternative treatments, therapies and medications; and street drugs.  ~  ~ Do not eat or drink anything until evaluated by provider.  ~ Inform provider if using any impotence medications (such as Viagra).  ~ Place patient in a position of comfort and loosen tight clothing. Call EMS 911 if chest pain or difficulty breathing worsens or new swelling of lower extremities or gradual abdomen swelling.  ~  ~ IMMEDIATE ACTION 02/21/2010 2:01:25AM Page 1 of 1 [id number]

## 2011-01-05 NOTE — Assessment & Plan Note (Signed)
Summary: chest congestion/jnl   Vital Signs:  Patient Profile:   65 Years Old Female Weight:      80 pounds (36.36 kg) Temp:     98.4 degrees F (36.89 degrees C) oral Pulse rate:   84 / minute BP sitting:   118 / 82  (left arm)  Vitals Entered By: Alfred Levins, CMA (July 04, 2007 10:47 AM)               Chief Complaint:  BURNING STOMACH.  History of Present Illness: For past 3 months she has had frequent heartburn and epigastric pains. Some nausea but no vomiting. BMs OK. Prilosec  helps a little. No SOB.  Current Allergies: No known allergies   Past Medical History:    Reviewed history and no changes required:       GERD  Past Surgical History:    Denies surgical history     Review of Systems      See HPI   Physical Exam  General:     Well-developed,well-nourished,in no acute distress; alert,appropriate and cooperative throughout examination Abdomen:     Bowel sounds positive,abdomen soft and non-tender without masses, organomegaly or hernias noted.    Impression & Recommendations:  Problem # 1:  GERD (ICD-530.81)  The following medications were removed from the medication list:    Prilosec Otc 20 Mg Tbec (Omeprazole magnesium) .Marland Kitchen... 1 by mouth once daily  Her updated medication list for this problem includes:    Nexium 40 Mg Cpdr (Esomeprazole magnesium) ..... Once daily   Medications Added to Medication List This Visit: 1)  Prilosec Otc 20 Mg Tbec (Omeprazole magnesium) .Marland Kitchen.. 1 by mouth once daily 2)  Multivitamins Tabs (Multiple vitamin) .Marland Kitchen.. 1 by mouth once daily 3)  Wellbutrin 75 Mg Tabs (Bupropion hcl) .Marland Kitchen.. 1 by mouth once daily 4)  Nexium 40 Mg Cpdr (Esomeprazole magnesium) .... Once daily   Patient Instructions: 1)  set up cpx soon   Prescriptions: NEXIUM 40 MG  CPDR (ESOMEPRAZOLE MAGNESIUM) once daily  #90 x 3   Entered and Authorized by:   [name] MD   Signed by:   [name] MD on 07/04/2007   Method used:    Electronically sent to ...       [facility] [id number]       [address]       [place], [place]  [address]       Ph: [phone]       Fax: [phone]   RxID:   [id number]

## 2011-09-02 ENCOUNTER — Other Ambulatory Visit (INDEPENDENT_AMBULATORY_CARE_PROVIDER_SITE_OTHER): Payer: BC Managed Care – PPO

## 2011-09-02 DIAGNOSIS — Z0000 Encounter for general adult medical examination without abnormal findings: Secondary | ICD-10-CM

## 2011-09-02 LAB — POCT URINALYSIS DIPSTICK
Bilirubin, UA: NEGATIVE
Glucose, UA: NEGATIVE
Ketones, UA: NEGATIVE
Leukocytes, UA: NEGATIVE
Nitrite, UA: NEGATIVE
Protein, UA: NEGATIVE
Spec Grav, UA: 1.02
Urobilinogen, UA: 0.2
pH, UA: 6.5

## 2011-09-02 LAB — CBC WITH DIFFERENTIAL/PLATELET
Basophils Absolute: 0 10*3/uL (ref 0.0–0.1)
Basophils Relative: 0.4 % (ref 0.0–3.0)
Eosinophils Absolute: 0.2 10*3/uL (ref 0.0–0.7)
Eosinophils Relative: 2 % (ref 0.0–5.0)
HCT: 44.7 % (ref 36.0–46.0)
Hemoglobin: 14.9 g/dL (ref 12.0–15.0)
Lymphocytes Relative: 34.7 % (ref 12.0–46.0)
Lymphs Abs: 2.8 10*3/uL (ref 0.7–4.0)
MCHC: 33.3 g/dL (ref 30.0–36.0)
MCV: 96.2 fl (ref 78.0–100.0)
Monocytes Absolute: 0.6 10*3/uL (ref 0.1–1.0)
Monocytes Relative: 7.7 % (ref 3.0–12.0)
Neutro Abs: 4.5 10*3/uL (ref 1.4–7.7)
Neutrophils Relative %: 55.2 % (ref 43.0–77.0)
Platelets: 226 10*3/uL (ref 150.0–400.0)
RBC: 4.64 Mil/uL (ref 3.87–5.11)
RDW: 13.8 % (ref 11.5–14.6)
WBC: 8.1 10*3/uL (ref 4.5–10.5)

## 2011-09-03 LAB — HEPATIC FUNCTION PANEL
ALT: 12 U/L (ref 0–35)
AST: 15 U/L (ref 0–37)
Albumin: 4.4 g/dL (ref 3.5–5.2)
Alkaline Phosphatase: 78 U/L (ref 39–117)
Bilirubin, Direct: 0.1 mg/dL (ref 0.0–0.3)
Total Bilirubin: 0.6 mg/dL (ref 0.3–1.2)
Total Protein: 6.8 g/dL (ref 6.0–8.3)

## 2011-09-03 LAB — LIPID PANEL
Cholesterol: 197 mg/dL (ref 0–200)
HDL: 47.9 mg/dL (ref >39.00)
LDL Cholesterol: 121 mg/dL — ABNORMAL HIGH (ref 0–99)
Total CHOL/HDL Ratio: 4
Triglycerides: 141 mg/dL (ref 0.0–149.0)
VLDL: 28.2 mg/dL (ref 0.0–40.0)

## 2011-09-03 LAB — BASIC METABOLIC PANEL
BUN: 17 mg/dL (ref 6–23)
CO2: 26 mEq/L (ref 19–32)
Calcium: 9.2 mg/dL (ref 8.4–10.5)
Chloride: 107 mEq/L (ref 96–112)
Creatinine, Ser: 0.9 mg/dL (ref 0.4–1.2)
GFR: 68.95 mL/min (ref >60.00)
Glucose, Bld: 95 mg/dL (ref 70–99)
Potassium: 4 mEq/L (ref 3.5–5.1)
Sodium: 141 mEq/L (ref 135–145)

## 2011-09-03 LAB — TSH: TSH: 3.05 u[IU]/mL (ref 0.35–5.50)

## 2011-09-10 ENCOUNTER — Encounter: Payer: Self-pay | Admitting: Family Medicine

## 2011-09-10 ENCOUNTER — Telehealth: Payer: Self-pay | Admitting: Family Medicine

## 2011-09-10 NOTE — Telephone Encounter (Signed)
Spoke with pt and gave results. 

## 2011-09-10 NOTE — Telephone Encounter (Signed)
Message copied by Baldemar Friday on Fri Sep 10, 2011  3:37 PM ------      Message from: Gershon Crane A      Created: Mon Sep 06, 2011  5:16 AM       Normal except high chol. Watch the diet

## 2011-09-13 ENCOUNTER — Ambulatory Visit (INDEPENDENT_AMBULATORY_CARE_PROVIDER_SITE_OTHER): Payer: BC Managed Care – PPO | Admitting: Family Medicine

## 2011-09-13 ENCOUNTER — Encounter: Payer: Self-pay | Admitting: Family Medicine

## 2011-09-13 VITALS — BP 120/82 | HR 86 | Temp 98.3°F | Ht 64.75 in | Wt 186.0 lb

## 2011-09-13 DIAGNOSIS — Z23 Encounter for immunization: Secondary | ICD-10-CM

## 2011-09-13 DIAGNOSIS — Z2911 Encounter for prophylactic immunotherapy for respiratory syncytial virus (RSV): Secondary | ICD-10-CM

## 2011-09-13 DIAGNOSIS — Z0000 Encounter for general adult medical examination without abnormal findings: Secondary | ICD-10-CM

## 2011-09-13 MED ORDER — ATORVASTATIN CALCIUM 40 MG PO TABS: 40.0000 mg | ORAL_TABLET | Freq: Every day | ORAL | Status: DC

## 2011-09-13 MED ORDER — BUPROPION HCL ER (XL) 300 MG PO TB24: 300.0000 mg | ORAL_TABLET | ORAL | Status: DC

## 2011-09-13 NOTE — Progress Notes (Signed)
Subjective:    Patient ID: Emily Horton, female    DOB: [birth year], 65 y.o.   MRN: [id number]  HPI 65 yr old female for a cpx. He is doing well in general, but he would like to increase his dose of Wellbutrin. He stopped taking Zoloft a long time ago due to an upset stomach, but he still feels stressed often. He sleeps well.  He gets little exercise.    Review of Systems  Constitutional: Negative.  Negative for fever, diaphoresis, activity change, appetite change, fatigue and unexpected weight change.  HENT: Negative.  Negative for hearing loss, ear pain, nosebleeds, congestion, sore throat, trouble swallowing, neck pain, neck stiffness, voice change and tinnitus.   Eyes: Negative.  Negative for photophobia, pain, discharge, redness and visual disturbance.  Respiratory: Negative.  Negative for apnea, cough, choking, chest tightness, shortness of breath, wheezing and stridor.   Cardiovascular: Negative.  Negative for chest pain, palpitations and leg swelling.  Gastrointestinal: Negative.  Negative for nausea, vomiting, abdominal pain, diarrhea, constipation, blood in stool, abdominal distention and rectal pain.  Genitourinary: Negative.  Negative for dysuria, urgency, frequency, hematuria, flank pain, vaginal bleeding, vaginal discharge, enuresis, difficulty urinating, vaginal pain and menstrual problem.  Musculoskeletal: Negative.  Negative for myalgias, back pain, joint swelling, arthralgias and gait problem.  Skin: Negative.  Negative for color change, pallor, rash and wound.  Neurological: Negative.  Negative for dizziness, tremors, seizures, syncope, speech difficulty, weakness, light-headedness, numbness and headaches.  Hematological: Negative.  Negative for adenopathy. Does not bruise/bleed easily.  Psychiatric/Behavioral: Negative.  Negative for hallucinations, behavioral problems, confusion, sleep disturbance, dysphoric mood and agitation. The patient is not nervous/anxious.          Objective:   Physical Exam  Constitutional: She appears well-developed and well-nourished. No distress.  HENT:  Head: Normocephalic and atraumatic.  Right Ear: External ear normal.  Left Ear: External ear normal.  Nose: Nose normal.  Mouth/Throat: Oropharynx is clear and moist. No oropharyngeal exudate.  Eyes: Conjunctivae and EOM are normal. Pupils are equal, round, and reactive to light. Right eye exhibits no discharge. Left eye exhibits no discharge. No scleral icterus.  Neck: Normal range of motion. Neck supple. No JVD present. No thyromegaly present.  Cardiovascular: Normal rate, regular rhythm, normal heart sounds and intact distal pulses.  Exam reveals no gallop and no friction rub.   No murmur heard.      EKG normal   Pulmonary/Chest: Effort normal and breath sounds normal. No stridor. No respiratory distress. She has no wheezes. She has no rales. She exhibits no tenderness.  Abdominal: Soft. Normal appearance and bowel sounds are normal. She exhibits no distension, no abdominal bruit, no ascites and no mass. There is no hepatosplenomegaly. There is no tenderness. There is no rigidity, no rebound and no guarding. No hernia.  Genitourinary: Rectum normal, vagina normal and uterus normal. No breast swelling, tenderness, discharge or bleeding. Cervix exhibits no motion tenderness, no discharge and no friability. Right adnexum displays no mass, no tenderness and no fullness. Left adnexum displays no mass, no tenderness and no fullness. No erythema, tenderness or bleeding around the vagina. No vaginal discharge found.  Musculoskeletal: Normal range of motion. She exhibits no edema and no tenderness.  Lymphadenopathy:    She has no cervical adenopathy.  Neurological: She is alert. She has normal reflexes. No cranial nerve deficit. She exhibits normal muscle tone. Coordination normal.  Skin: Skin is warm and dry. No rash noted. She is not diaphoretic. No erythema.  No pallor.  Psychiatric:  She has a normal mood and affect. Her behavior is normal. Judgment and thought content normal.          Assessment & Plan:  We will increase Wellbutrin to 300 mg a day. Get more exercise

## 2011-09-13 NOTE — Progress Notes (Signed)
Addended by: Aniceto Boss A on: 09/13/2011 02:12 PM   Modules accepted: Orders

## 2011-09-27 ENCOUNTER — Other Ambulatory Visit: Payer: Self-pay | Admitting: Family Medicine

## 2012-06-23 ENCOUNTER — Ambulatory Visit (INDEPENDENT_AMBULATORY_CARE_PROVIDER_SITE_OTHER): Payer: BC Managed Care – PPO | Admitting: Gastroenterology

## 2012-06-23 ENCOUNTER — Encounter: Payer: Self-pay | Admitting: Gastroenterology

## 2012-06-23 VITALS — BP 120/80 | HR 100 | Ht 65.75 in | Wt 188.4 lb

## 2012-06-23 DIAGNOSIS — K219 Gastro-esophageal reflux disease without esophagitis: Secondary | ICD-10-CM

## 2012-06-23 DIAGNOSIS — F341 Dysthymic disorder: Secondary | ICD-10-CM

## 2012-06-23 DIAGNOSIS — R079 Chest pain, unspecified: Secondary | ICD-10-CM

## 2012-06-23 DIAGNOSIS — Z860100 Personal history of colon polyps, unspecified: Secondary | ICD-10-CM

## 2012-06-23 DIAGNOSIS — F418 Other specified anxiety disorders: Secondary | ICD-10-CM

## 2012-06-23 MED ORDER — DEXLANSOPRAZOLE 60 MG PO CPDR: 60.0000 mg | DELAYED_RELEASE_CAPSULE | Freq: Every day | ORAL | Status: DC

## 2012-06-23 MED ORDER — HYOSCYAMINE SULFATE 0.125 MG SL SUBL: 0.1250 mg | SUBLINGUAL_TABLET | SUBLINGUAL | Status: DC | PRN

## 2012-06-23 MED ORDER — SUCRALFATE 1 GM/10ML PO SUSP: ORAL | Status: DC

## 2012-06-23 NOTE — Patient Instructions (Addendum)
You have been given a separate informational sheet regarding your tobacco use, the importance of quitting and local resources to help you quit.  You have been scheduled for an endoscopy and colonoscopy with propofol. Please follow the written instructions given to you at your visit today. Please pick up your prep at the pharmacy within the next 1-3 days. If you use inhalers (even only as needed), please bring them with you on the day of your procedure. You have been given samples of Dexilant to take in place of your Zegerid one tablet by mouth once daily 30 minutes before breakfast. A prescription has been sent to your pharmacy.  We have sent the following medications to your pharmacy for you to pick up at your convenience: Dexilant, Carafate, Levsin. cc: Gershon Crane, MD

## 2012-06-23 NOTE — Progress Notes (Signed)
This is a 66 year old Caucasian female with a history of recurrent colon polyps due for colonoscopy. She's had chronic GERD brother severe burning substernal chest pain daytime and nighttime over the last several months refractory to Zegerid and antacids. She denies dysphagia, nausea vomiting, or any hepatobiliary complaints. Patient denies any specific cardiovascular, or pulmonary difficulties. She's under a lot of stress with the husband who is sick was Alzheimer's disease. The patient denies abuse of alcohol, cigarettes, or NSAIDs. She is followed by Dr. Gershon Crane, and review of labs shows a normal CBC a metabolic profile.  Current Medications, Allergies, Past Medical History, Past Surgical History, Family History and Social History were reviewed in [employer] record.  Pertinent Review of Systems Negative   Physical Exam: Healthy patient in no distress. Blood pressure 120/80, pulse 100 and regular, weight 188 pounds the BMI of 30.64. I cannot appreciate stigmata of chronic liver disease. Her chest is clear and she is in a regular rhythm without murmurs gallops or rubs. There is no organomegaly, abdominal masses or tenderness. Bowel sounds are normal. Peripheral extremities are unremarkable and mental status is normal.    Assessment and Plan: Worsening GERD probably related to large hiatal hernia. This patient not had previous endoscopy which I have scheduled at her convenience. At that time we will also do followup colonoscopy per her adenomatous polyps. I reviewed her reflux regime with her, and he changed her to Dexilant 60 mg before the first meal of the day with Carafate suspension 1 tablespoon at bedtime and when necessary daily with when necessary sublingual Levsin for her esophageal spasms. Encounter Diagnoses  Name Primary?  . Esophageal reflux Yes  . Personal history of colonic polyps

## 2012-06-26 ENCOUNTER — Other Ambulatory Visit: Payer: Self-pay | Admitting: Gastroenterology

## 2012-06-26 MED ORDER — MOVIPREP 100 G PO SOLR: ORAL | Status: DC

## 2012-06-26 NOTE — Telephone Encounter (Signed)
rx sent. Patient advised. 

## 2012-06-30 ENCOUNTER — Ambulatory Visit (AMBULATORY_SURGERY_CENTER): Payer: BC Managed Care – PPO | Admitting: Gastroenterology

## 2012-06-30 ENCOUNTER — Encounter: Payer: Self-pay | Admitting: Gastroenterology

## 2012-06-30 ENCOUNTER — Other Ambulatory Visit: Payer: Self-pay | Admitting: Gastroenterology

## 2012-06-30 VITALS — BP 127/106 | HR 88 | Temp 98.4°F | Resp 15 | Ht 65.5 in | Wt 188.0 lb

## 2012-06-30 DIAGNOSIS — K209 Esophagitis, unspecified without bleeding: Secondary | ICD-10-CM

## 2012-06-30 DIAGNOSIS — D126 Benign neoplasm of colon, unspecified: Secondary | ICD-10-CM

## 2012-06-30 DIAGNOSIS — K5730 Diverticulosis of large intestine without perforation or abscess without bleeding: Secondary | ICD-10-CM

## 2012-06-30 DIAGNOSIS — K219 Gastro-esophageal reflux disease without esophagitis: Secondary | ICD-10-CM

## 2012-06-30 DIAGNOSIS — R079 Chest pain, unspecified: Secondary | ICD-10-CM

## 2012-06-30 DIAGNOSIS — K2970 Gastritis, unspecified, without bleeding: Secondary | ICD-10-CM

## 2012-06-30 DIAGNOSIS — Z86010 Personal history of colonic polyps: Secondary | ICD-10-CM

## 2012-06-30 HISTORY — PX: ESOPHAGOGASTRODUODENOSCOPY: SHX1529

## 2012-06-30 MED ORDER — SODIUM CHLORIDE 0.9 % IV SOLN: 500.0000 mL | INTRAVENOUS | Status: DC

## 2012-06-30 NOTE — Op Note (Signed)
[facility] [address] [place], [place]  [address]  COLONOSCOPY PROCEDURE REPORT  PATIENT:  Emily, Horton  MR#:  [id number] BIRTHDATE:  01/17/1946, 65 yrs. old  GENDER:  female ENDOSCOPIST:  Emily Rea. Jarold Motto, MD, Emily Horton REF. BY: PROCEDURE DATE:  06/30/2012 PROCEDURE:  Colonoscopy with biopsy and snare polypectomy ASA CLASS:  Class II INDICATIONS:  history of pre-cancerous (adenomatous) colon polyps  MEDICATIONS:   propofol (Diprivan) 270 mg IV  DESCRIPTION OF PROCEDURE:   After the risks and benefits and of the procedure were explained, informed consent was obtained. Digital rectal exam was performed and revealed no abnormalities. The LB CF-H180AL [id number] endoscope was introduced through the anus and advanced to the cecum, which was identified by both the appendix and ileocecal valve.  The quality of the prep was adequate, using MoviPrep.  The instrument was then slowly withdrawn as the colon was fully examined. <<PROCEDUREIMAGES>>  FINDINGS:  There were mild diverticular changes in left colon. diverticulosis was found.  There were multiple polyps identified and removed. in the rectum and sigmoid colon. MULTIPLE 2-5 MM FLAT POLYPS COLD BX. AND SNARE REMOVED.   Retroflexed views in the rectum revealed no abnormalities.    The scope was then withdrawn from the patient and the procedure completed.  COMPLICATIONS:  None ENDOSCOPIC IMPRESSION: 1) Diverticulosis,mild,left sided diverticulosis 2) Polyps, multiple in the rectum and sigmoid colon R/O ADENOMAS. RECOMMENDATIONS: 1) Await biopsy results 2) Repeat colonoscopy in 5 years if polyp adenomatous; otherwise 10 years  REPEAT EXAM:  No  ______________________________ Emily Rea. Jarold Motto, MD, [name]  CC:  Emily Salisbury, MD  n. Rosalie DoctorMarland Kitchen   Emily Rea. Emily Horton at 06/30/2012 04:08 PM  Laure Kidney, [phone]

## 2012-06-30 NOTE — Op Note (Signed)
[facility] [address] [place], [place]  [address]  ENDOSCOPY PROCEDURE REPORT  PATIENT:  Emily, Horton  MR#:  [id number] BIRTHDATE:  [birth year], 65 yrs. old  GENDER:  female  ENDOSCOPIST:  Vania Rea. Jarold Motto, MD, [facility] Referred by:  PROCEDURE DATE:  06/30/2012 PROCEDURE:  EGD with biopsy, 43239, EGD with biopsy for H. pylori 19147 ASA CLASS:  Class II INDICATIONS:  CHRONIC GERD,,,R/O BARRETT'S MUCOSA  MEDICATIONS:   There was residual sedation effect present from prior procedure., propofol (Diprivan) 130 mg IV, glycopyrrolate (Robinal) 0.2 mg IV TOPICAL ANESTHETIC:  Cetacaine Spray  DESCRIPTION OF PROCEDURE:   After the risks and benefits of the procedure were explained, informed consent was obtained.  The LB GIF-H180 [id number] endoscope was introduced through the mouth and advanced to the second portion of the duodenum.  The instrument was slowly withdrawn as the mucosa was fully examined. <<PROCEDUREIMAGES>>  Normal duodenal folds were noted.  The stomach was entered and closely examined. The antrum, angularis, and lesser curvature were well visualized, including a retroflexed view of the cardia and fundus. The stomach wall was normally distensable. The scope passed easily through the pylorus into the duodenum. CLO BX.VERY MINIMAL ANTRAL ERYTHEMA NOTED.  There were columnar-type mucosal changes in the distal esophagus, that could represent Barrett's esophagus. at the gastroesophageal junction. SEE PICTURES.IRREGULAR Z-LINE BIOPSIED,NO ESOPHAGITIS OR LARGE HH,SEVERE SPASM NOTED.  Otherwise normal esophagus.    SMALL HH NOTED.  The scope was then withdrawn from the patient and the procedure completed.  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 Jarold Motto, MD, [name]  CC:  [name], MD  n. Rosalie DoctorMarland Kitchen   Vania Rea. Patterson at 06/30/2012 04:15 PM  [name], [name], [phone]

## 2012-06-30 NOTE — Progress Notes (Signed)
Patient did not experience any of the following events: a burn prior to discharge; a fall within the facility; wrong site/side/patient/procedure/implant event; or a hospital transfer or hospital admission upon discharge from the facility. (G8907) Patient did not have preoperative order for IV antibiotic SSI prophylaxis. (G8918)  

## 2012-06-30 NOTE — Patient Instructions (Addendum)
START NEXIUM 40 MG SAMPLES BY MOUTH 30 MINUTES PRIOR TO BREAKFAST. CALL YOUR INSURANCE COMPANY AND FIND OUT WHICH "PPI" WOULD BE COVERED AND CALL DR. PATTERSON TO NOTIFY.([phone])     YOU HAD AN ENDOSCOPIC PROCEDURE TODAY AT THE [facility]: Refer to the procedure report that was given to you for any specific questions about what was found during the examination.  If the procedure report does not answer your questions, please call your gastroenterologist to clarify.  If you requested that your care partner not be given the details of your procedure findings, then the procedure report has been included in a sealed envelope for you to review at your convenience later.  YOU SHOULD EXPECT: Some feelings of bloating in the abdomen. Passage of more gas than usual.  Walking can help get rid of the air that was put into your GI tract during the procedure and reduce the bloating. If you had a lower endoscopy (such as a colonoscopy or flexible sigmoidoscopy) you may notice spotting of blood in your stool or on the toilet paper. If you underwent a bowel prep for your procedure, then you may not have a normal bowel movement for a few days.  DIET: Your first meal following the procedure should be a light meal and then it is ok to progress to your normal diet.  A half-sandwich or bowl of soup is an example of a good first meal.  Heavy or fried foods are harder to digest and may make you feel nauseous or bloated.  Likewise meals heavy in dairy and vegetables can cause extra gas to form and this can also increase the bloating.  Drink plenty of fluids but you should avoid alcoholic beverages for 24 hours.  ACTIVITY: Your care partner should take you home directly after the procedure.  You should plan to take it easy, moving slowly for the rest of the day.  You can resume normal activity the day after the procedure however you should NOT DRIVE or use heavy machinery for 24 hours (because of the sedation  medicines used during the test).    SYMPTOMS TO REPORT IMMEDIATELY: A gastroenterologist can be reached at any hour.  During normal business hours, 8:30 AM to 5:00 PM Monday through Friday, call [phone].  After hours and on weekends, please call the GI answering service at [phone] who will take a message and have the physician on call contact you.   Following lower endoscopy (colonoscopy or flexible sigmoidoscopy):  Excessive amounts of blood in the stool  Significant tenderness or worsening of abdominal pains  Swelling of the abdomen that is new, acute  Fever of 100F or higher  Following upper endoscopy (EGD)  Vomiting of blood or coffee ground material  New chest pain or pain under the shoulder blades  Painful or persistently difficult swallowing  New shortness of breath  Fever of 100F or higher  Black, tarry-looking stools  FOLLOW UP: If any biopsies were taken you will be contacted by phone or by letter within the next 1-3 weeks.  Call your gastroenterologist if you have not heard about the biopsies in 3 weeks.  Our staff will call the home number listed on your records the next business day following your procedure to check on you and address any questions or concerns that you may have at that time regarding the information given to you following your procedure. This is a courtesy call and so if there is no answer at the home  number and we have not heard from you through the emergency physician on call, we will assume that you have returned to your regular daily activities without incident.  SIGNATURES/CONFIDENTIALITY: You and/or your care partner have signed paperwork which will be entered into your electronic medical record.  These signatures attest to the fact that that the information above on your After Visit Summary has been reviewed and is understood.  Full responsibility of the confidentiality of this discharge information lies with you and/or your care-partner.

## 2012-07-03 ENCOUNTER — Telehealth: Payer: Self-pay | Admitting: *Deleted

## 2012-07-03 LAB — HELICOBACTER PYLORI SCREEN-BIOPSY: UREASE: NEGATIVE

## 2012-07-03 NOTE — Telephone Encounter (Signed)
  Follow up Call-  Call back number 06/30/2012  Post procedure Call Back phone  # [phone]  Permission to leave phone message Yes     Patient questions:  Do you have a fever, pain , or abdominal swelling? no Pain Score  0 *  Have you tolerated food without any problems? yes  Have you been able to return to your normal activities? yes  Do you have any questions about your discharge instructions: Diet   no Medications  no Follow up visit  no  Do you have questions or concerns about your Care? no  Actions: * If pain score is 4 or above: No action needed, pain <4.  Spoke with patients husband. He reports patient is out walking at this time. He states she is not having any problems. Left message with my name and phone number to call if she has any questions or concerns.

## 2012-07-07 ENCOUNTER — Telehealth: Payer: Self-pay | Admitting: Gastroenterology

## 2012-07-07 NOTE — Telephone Encounter (Signed)
Pt had ECL on 06/30/12 with EGD showing chronic mild inflammation and tubular adenomas in the colon. Today, pt c/o the same type problems as on 06/23/12 OV. Pt reports a "knot" in the middle of her chest that she describes as burning. She is on Nexium because she couldn't afford Dexilant; carafate also ordered at night, but pt doesn't think it's doing any good. Pt states she woke up at 4am this am with the burning, took carafate and is still hurting. We discussed Dr Norval Gable notes and she never got the Levsin filled. She will try the levsin for esophageal spasms and call Monday with an update. Anything else pt can do? Please advise. Thanks.

## 2012-07-10 NOTE — Telephone Encounter (Signed)
Needs manometry and 24 ph on rx.Marland KitchenMarland Kitchen

## 2012-07-10 NOTE — Telephone Encounter (Signed)
Checked on pt's condition and she reports she is doing better since she started the Levsin; she is also watching what she eats. Informed her Dr Jarold Motto would like to do more tests and she stated she will see how it goes and call back if she needs to. She reports her husband has early Alzheimer's and it's hard to schedule anything; states that might be part of her problem. Dr Jarold Motto, Lorain Childes; pt will wait to schedule the EM and 24 Hr PH tests.

## 2012-07-11 ENCOUNTER — Encounter: Payer: Self-pay | Admitting: Gastroenterology

## 2012-07-24 ENCOUNTER — Other Ambulatory Visit: Payer: Self-pay | Admitting: Gastroenterology

## 2012-07-24 MED ORDER — ESOMEPRAZOLE MAGNESIUM 40 MG PO CPDR: 40.0000 mg | DELAYED_RELEASE_CAPSULE | Freq: Every day | ORAL | Status: DC

## 2012-07-24 NOTE — Telephone Encounter (Signed)
New prescription sent to patient's pharmacy for Nexium because patient could not afford Dexilant. Pt notified.

## 2012-08-21 ENCOUNTER — Other Ambulatory Visit (INDEPENDENT_AMBULATORY_CARE_PROVIDER_SITE_OTHER): Payer: BC Managed Care – PPO

## 2012-08-21 DIAGNOSIS — Z0000 Encounter for general adult medical examination without abnormal findings: Secondary | ICD-10-CM

## 2012-08-21 LAB — BASIC METABOLIC PANEL
BUN: 14 mg/dL (ref 6–23)
CO2: 26 mEq/L (ref 19–32)
Calcium: 9.5 mg/dL (ref 8.4–10.5)
Chloride: 105 mEq/L (ref 96–112)
Creatinine, Ser: 0.9 mg/dL (ref 0.4–1.2)
GFR: 66.98 mL/min (ref >60.00)
Glucose, Bld: 100 mg/dL — ABNORMAL HIGH (ref 70–99)
Potassium: 3.7 mEq/L (ref 3.5–5.1)
Sodium: 138 mEq/L (ref 135–145)

## 2012-08-21 LAB — POCT URINALYSIS DIPSTICK
Bilirubin, UA: NEGATIVE
Glucose, UA: NEGATIVE
Ketones, UA: NEGATIVE
Nitrite, UA: NEGATIVE
Protein, UA: NEGATIVE
Spec Grav, UA: 1.01
Urobilinogen, UA: 0.2
pH, UA: 6.5

## 2012-08-21 LAB — HEPATIC FUNCTION PANEL
ALT: 11 U/L (ref 0–35)
AST: 12 U/L (ref 0–37)
Albumin: 4.1 g/dL (ref 3.5–5.2)
Alkaline Phosphatase: 91 U/L (ref 39–117)
Bilirubin, Direct: 0.1 mg/dL (ref 0.0–0.3)
Total Bilirubin: 0.6 mg/dL (ref 0.3–1.2)
Total Protein: 6.9 g/dL (ref 6.0–8.3)

## 2012-08-21 LAB — CBC WITH DIFFERENTIAL/PLATELET
Basophils Absolute: 0 10*3/uL (ref 0.0–0.1)
Basophils Relative: 0.4 % (ref 0.0–3.0)
Eosinophils Absolute: 0.3 10*3/uL (ref 0.0–0.7)
Eosinophils Relative: 3.6 % (ref 0.0–5.0)
HCT: 43.7 % (ref 36.0–46.0)
Hemoglobin: 14.4 g/dL (ref 12.0–15.0)
Lymphocytes Relative: 33.8 % (ref 12.0–46.0)
Lymphs Abs: 2.6 10*3/uL (ref 0.7–4.0)
MCHC: 32.9 g/dL (ref 30.0–36.0)
MCV: 96.4 fl (ref 78.0–100.0)
Monocytes Absolute: 0.5 10*3/uL (ref 0.1–1.0)
Monocytes Relative: 7.1 % (ref 3.0–12.0)
Neutro Abs: 4.2 10*3/uL (ref 1.4–7.7)
Neutrophils Relative %: 55.1 % (ref 43.0–77.0)
Platelets: 225 10*3/uL (ref 150.0–400.0)
RBC: 4.54 Mil/uL (ref 3.87–5.11)
RDW: 13.3 % (ref 11.5–14.6)
WBC: 7.6 10*3/uL (ref 4.5–10.5)

## 2012-08-21 LAB — LIPID PANEL
Cholesterol: 172 mg/dL (ref 0–200)
HDL: 42 mg/dL (ref >39.00)
LDL Cholesterol: 93 mg/dL (ref 0–99)
Total CHOL/HDL Ratio: 4
Triglycerides: 184 mg/dL — ABNORMAL HIGH (ref 0.0–149.0)
VLDL: 36.8 mg/dL (ref 0.0–40.0)

## 2012-08-21 LAB — TSH: TSH: 3.55 u[IU]/mL (ref 0.35–5.50)

## 2012-09-19 ENCOUNTER — Encounter: Payer: Self-pay | Admitting: Family Medicine

## 2012-09-19 ENCOUNTER — Ambulatory Visit (INDEPENDENT_AMBULATORY_CARE_PROVIDER_SITE_OTHER): Payer: BC Managed Care – PPO | Admitting: Family Medicine

## 2012-09-19 VITALS — BP 128/76 | HR 92 | Temp 98.4°F | Ht 65.25 in | Wt 187.0 lb

## 2012-09-19 DIAGNOSIS — Z0000 Encounter for general adult medical examination without abnormal findings: Secondary | ICD-10-CM

## 2012-09-19 DIAGNOSIS — Z23 Encounter for immunization: Secondary | ICD-10-CM

## 2012-09-19 MED ORDER — ATORVASTATIN CALCIUM 40 MG PO TABS: 40.0000 mg | ORAL_TABLET | Freq: Every day | ORAL | Status: DC

## 2012-09-19 MED ORDER — OMEPRAZOLE 40 MG PO CPDR: 40.0000 mg | DELAYED_RELEASE_CAPSULE | Freq: Every day | ORAL | Status: DC

## 2012-09-19 MED ORDER — BUPROPION HCL ER (XL) 300 MG PO TB24: 300.0000 mg | ORAL_TABLET | ORAL | Status: DC

## 2012-09-19 MED ORDER — DIAZEPAM 5 MG PO TABS: 5.0000 mg | ORAL_TABLET | Freq: Two times a day (BID) | ORAL | Status: DC | PRN

## 2012-09-19 NOTE — Addendum Note (Signed)
Addended by: Aniceto Boss A on: 09/19/2012 11:16 AM   Modules accepted: Orders

## 2012-09-19 NOTE — Progress Notes (Signed)
  Subjective:    Patient ID: Emily Horton, female    DOB: June 29, 1946, 66 y.o.   MRN: [id number]  HPI 66 yr old female for a cpx. She feels well physically but she is under a lot of stress. Her husband has Alzheimers, and she is still caring for him at home. She is looking into some adult day care programs for help. She is very anxious now, although the wellbutrin helps. She has trouble sleeping at times. She is using OTC Lansoprazole for GERD but it doesn't help much. She cannot afford Dexilant or Nexium.   Review of Systems  Constitutional: Negative.   HENT: Negative.   Eyes: Negative.   Respiratory: Negative.   Cardiovascular: Negative.   Gastrointestinal: Negative.   Genitourinary: Negative for dysuria, urgency, frequency, hematuria, flank pain, decreased urine volume, enuresis, difficulty urinating, pelvic pain and dyspareunia.  Musculoskeletal: Negative.   Skin: Negative.   Neurological: Negative.   Hematological: Negative.   Psychiatric/Behavioral: Positive for disturbed wake/sleep cycle. Negative for hallucinations, behavioral problems, confusion, dysphoric mood, decreased concentration and agitation. The patient is nervous/anxious. The patient is not hyperactive.        Objective:   Physical Exam  Constitutional: She is oriented to person, place, and time. She appears well-developed and well-nourished. No distress.  HENT:  Head: Normocephalic and atraumatic.  Right Ear: External ear normal.  Left Ear: External ear normal.  Nose: Nose normal.  Mouth/Throat: Oropharynx is clear and moist. No oropharyngeal exudate.  Eyes: Conjunctivae normal and EOM are normal. Pupils are equal, round, and reactive to light. No scleral icterus.  Neck: Normal range of motion. Neck supple. No JVD present. No thyromegaly present.  Cardiovascular: Normal rate, regular rhythm, normal heart sounds and intact distal pulses.  Exam reveals no gallop and no friction rub.   No murmur heard.      EKG normal    Pulmonary/Chest: Effort normal and breath sounds normal. No respiratory distress. She has no wheezes. She has no rales. She exhibits no tenderness.  Abdominal: Soft. Bowel sounds are normal. She exhibits no distension and no mass. There is no tenderness. There is no rebound and no guarding.  Musculoskeletal: Normal range of motion. She exhibits no edema and no tenderness.  Lymphadenopathy:    She has no cervical adenopathy.  Neurological: She is alert and oriented to person, place, and time. She has normal reflexes. No cranial nerve deficit. She exhibits normal muscle tone. Coordination normal.  Skin: Skin is warm and dry. No rash noted. No erythema.  Psychiatric: She has a normal mood and affect. Her behavior is normal. Judgment and thought content normal.          Assessment & Plan:  Well exam. Given a Pneumovax. Add Valium to her Wellbutrin. Try Omeprazole for the GERD.

## 2012-09-27 ENCOUNTER — Other Ambulatory Visit: Payer: Self-pay | Admitting: Family Medicine

## 2012-09-27 DIAGNOSIS — Z1231 Encounter for screening mammogram for malignant neoplasm of breast: Secondary | ICD-10-CM

## 2012-10-31 ENCOUNTER — Ambulatory Visit
Admission: RE | Admit: 2012-10-31 | Discharge: 2012-10-31 | Disposition: A | Discharge status: Home / Self Care | Payer: BC Managed Care – PPO | Source: Ambulatory Visit | Attending: Family Medicine | Admitting: Family Medicine

## 2012-10-31 DIAGNOSIS — Z1231 Encounter for screening mammogram for malignant neoplasm of breast: Secondary | ICD-10-CM

## 2013-08-16 DIAGNOSIS — H5200 Hypermetropia, unspecified eye: Secondary | ICD-10-CM | POA: Diagnosis not present

## 2013-08-16 DIAGNOSIS — H524 Presbyopia: Secondary | ICD-10-CM | POA: Diagnosis not present

## 2013-08-16 DIAGNOSIS — H52229 Regular astigmatism, unspecified eye: Secondary | ICD-10-CM | POA: Diagnosis not present

## 2013-08-16 DIAGNOSIS — H04129 Dry eye syndrome of unspecified lacrimal gland: Secondary | ICD-10-CM | POA: Diagnosis not present

## 2013-09-18 ENCOUNTER — Other Ambulatory Visit: Payer: Self-pay | Admitting: Family Medicine

## 2013-09-20 ENCOUNTER — Ambulatory Visit (INDEPENDENT_AMBULATORY_CARE_PROVIDER_SITE_OTHER): Payer: Medicare Other | Admitting: Family Medicine

## 2013-09-20 ENCOUNTER — Encounter: Payer: Self-pay | Admitting: Family Medicine

## 2013-09-20 VITALS — BP 126/74 | HR 96 | Temp 98.7°F | Ht 64.5 in | Wt 188.0 lb

## 2013-09-20 DIAGNOSIS — E785 Hyperlipidemia, unspecified: Secondary | ICD-10-CM

## 2013-09-20 DIAGNOSIS — F329 Major depressive disorder, single episode, unspecified: Secondary | ICD-10-CM

## 2013-09-20 DIAGNOSIS — F3289 Other specified depressive episodes: Secondary | ICD-10-CM

## 2013-09-20 DIAGNOSIS — K219 Gastro-esophageal reflux disease without esophagitis: Secondary | ICD-10-CM | POA: Diagnosis not present

## 2013-09-20 LAB — BASIC METABOLIC PANEL
BUN: 12 mg/dL (ref 6–23)
CO2: 25 mEq/L (ref 19–32)
Calcium: 9.5 mg/dL (ref 8.4–10.5)
Chloride: 105 mEq/L (ref 96–112)
Creatinine, Ser: 0.9 mg/dL (ref 0.4–1.2)
GFR: 70.34 mL/min (ref >60.00)
Glucose, Bld: 106 mg/dL — ABNORMAL HIGH (ref 70–99)
Potassium: 4.1 mEq/L (ref 3.5–5.1)
Sodium: 141 mEq/L (ref 135–145)

## 2013-09-20 LAB — POCT URINALYSIS DIPSTICK
Bilirubin, UA: NEGATIVE
Glucose, UA: NEGATIVE
Ketones, UA: NEGATIVE
Nitrite, UA: NEGATIVE
Protein, UA: NEGATIVE
Spec Grav, UA: 1.015
Urobilinogen, UA: 0.2
pH, UA: 7

## 2013-09-20 LAB — HEPATIC FUNCTION PANEL
ALT: 9 U/L (ref 0–35)
AST: 13 U/L (ref 0–37)
Albumin: 4.2 g/dL (ref 3.5–5.2)
Alkaline Phosphatase: 84 U/L (ref 39–117)
Bilirubin, Direct: 0 mg/dL (ref 0.0–0.3)
Total Bilirubin: 0.6 mg/dL (ref 0.3–1.2)
Total Protein: 6.9 g/dL (ref 6.0–8.3)

## 2013-09-20 LAB — CBC WITH DIFFERENTIAL/PLATELET
Basophils Absolute: 0 10*3/uL (ref 0.0–0.1)
Basophils Relative: 0.4 % (ref 0.0–3.0)
Eosinophils Absolute: 0.2 10*3/uL (ref 0.0–0.7)
Eosinophils Relative: 2 % (ref 0.0–5.0)
HCT: 42.4 % (ref 36.0–46.0)
Hemoglobin: 14.6 g/dL (ref 12.0–15.0)
Lymphocytes Relative: 26.6 % (ref 12.0–46.0)
Lymphs Abs: 2.3 10*3/uL (ref 0.7–4.0)
MCHC: 34.3 g/dL (ref 30.0–36.0)
MCV: 92.5 fl (ref 78.0–100.0)
Monocytes Absolute: 0.7 10*3/uL (ref 0.1–1.0)
Monocytes Relative: 7.6 % (ref 3.0–12.0)
Neutro Abs: 5.5 10*3/uL (ref 1.4–7.7)
Neutrophils Relative %: 63.4 % (ref 43.0–77.0)
Platelets: 230 10*3/uL (ref 150.0–400.0)
RBC: 4.59 Mil/uL (ref 3.87–5.11)
RDW: 13.5 % (ref 11.5–14.6)
WBC: 8.7 10*3/uL (ref 4.5–10.5)

## 2013-09-20 LAB — LIPID PANEL
Cholesterol: 165 mg/dL (ref 0–200)
HDL: 43.2 mg/dL (ref >39.00)
LDL Cholesterol: 95 mg/dL (ref 0–99)
Total CHOL/HDL Ratio: 4
Triglycerides: 135 mg/dL (ref 0.0–149.0)
VLDL: 27 mg/dL (ref 0.0–40.0)

## 2013-09-20 LAB — TSH: TSH: 1.05 u[IU]/mL (ref 0.35–5.50)

## 2013-09-20 MED ORDER — BUPROPION HCL ER (XL) 150 MG PO TB24: 150.0000 mg | ORAL_TABLET | Freq: Every day | ORAL | Status: DC

## 2013-09-20 MED ORDER — OMEPRAZOLE 40 MG PO CPDR: DELAYED_RELEASE_CAPSULE | ORAL | Status: DC

## 2013-09-20 MED ORDER — DIAZEPAM 5 MG PO TABS: 5.0000 mg | ORAL_TABLET | Freq: Two times a day (BID) | ORAL | Status: DC | PRN

## 2013-09-20 MED ORDER — ATORVASTATIN CALCIUM 40 MG PO TABS: 40.0000 mg | ORAL_TABLET | Freq: Every day | ORAL | Status: DC

## 2013-09-20 MED ORDER — BUPROPION HCL ER (XL) 300 MG PO TB24: 300.0000 mg | ORAL_TABLET | ORAL | Status: DC

## 2013-09-20 NOTE — Progress Notes (Signed)
  Subjective:    Patient ID: Emily Horton, female    DOB: [birth year], 67 y.o.   MRN: [id number]  HPI 67 yr old female for a cpx. She feels well except for her stress levels which have been very high. Her husband has dementia, and he has been very difficult for her to take care of. He is still able to take care of his ADLs, but he has mood swings and abuses alcohol. She gets sad, tearful, and very anxious. Valium helps. She asks if the dose of Wellbutrin can be increased.    Review of Systems  Constitutional: Negative.   HENT: Negative.   Eyes: Negative.   Respiratory: Negative.   Cardiovascular: Negative.   Gastrointestinal: Negative.   Genitourinary: Negative for dysuria, urgency, frequency, hematuria, flank pain, decreased urine volume, enuresis, difficulty urinating, pelvic pain and dyspareunia.  Musculoskeletal: Negative.   Skin: Negative.   Neurological: Negative.   Psychiatric/Behavioral: Positive for dysphoric mood. Negative for hallucinations, behavioral problems, confusion, sleep disturbance, decreased concentration and agitation. The patient is nervous/anxious.        Objective:   Physical Exam  Constitutional: She is oriented to person, place, and time. She appears well-developed and well-nourished. No distress.  HENT:  Head: Normocephalic and atraumatic.  Right Ear: External ear normal.  Left Ear: External ear normal.  Nose: Nose normal.  Mouth/Throat: Oropharynx is clear and moist. No oropharyngeal exudate.  Eyes: Conjunctivae and EOM are normal. Pupils are equal, round, and reactive to light. No scleral icterus.  Neck: Normal range of motion. Neck supple. No JVD present. No thyromegaly present.  Cardiovascular: Normal rate, regular rhythm, normal heart sounds and intact distal pulses.  Exam reveals no gallop and no friction rub.   No murmur heard. EKG normal   Pulmonary/Chest: Effort normal and breath sounds normal. No respiratory distress. She has no wheezes. She has  no rales. She exhibits no tenderness.  Abdominal: Soft. Bowel sounds are normal. She exhibits no distension and no mass. There is no tenderness. There is no rebound and no guarding.  Musculoskeletal: Normal range of motion. She exhibits no edema and no tenderness.  Lymphadenopathy:    She has no cervical adenopathy.  Neurological: She is alert and oriented to person, place, and time. She has normal reflexes. No cranial nerve deficit. She exhibits normal muscle tone. Coordination normal.  Skin: Skin is warm and dry. No rash noted. No erythema.  Psychiatric: She has a normal mood and affect. Her behavior is normal. Judgment and thought content normal.          Assessment & Plan:  Well exam. Get fasting labs. Increase Wellbutrin XL to a total of 450 mg daily.

## 2013-09-25 MED ORDER — CIPROFLOXACIN HCL 500 MG PO TABS: 500.0000 mg | ORAL_TABLET | Freq: Two times a day (BID) | ORAL | Status: DC

## 2013-09-25 NOTE — Addendum Note (Signed)
Addended by: Aniceto Boss A on: 09/25/2013 10:58 AM   Modules accepted: Orders

## 2013-09-25 NOTE — Progress Notes (Signed)
Quick Note:  I left voice message for pt to return my call. I did send script to [facility] and also released results in my chart. ______

## 2013-09-25 NOTE — Progress Notes (Signed)
Quick Note:  Pt called back and left a voice message. She will pick up script today for the UTI. ______

## 2013-10-11 ENCOUNTER — Other Ambulatory Visit: Payer: Self-pay

## 2014-01-16 ENCOUNTER — Other Ambulatory Visit: Payer: Self-pay

## 2014-01-16 DIAGNOSIS — Z1231 Encounter for screening mammogram for malignant neoplasm of breast: Secondary | ICD-10-CM

## 2014-02-01 ENCOUNTER — Ambulatory Visit: Payer: PRIVATE HEALTH INSURANCE

## 2014-02-04 ENCOUNTER — Encounter: Payer: Self-pay | Admitting: Family Medicine

## 2014-02-04 ENCOUNTER — Ambulatory Visit (INDEPENDENT_AMBULATORY_CARE_PROVIDER_SITE_OTHER): Payer: Medicare Other | Admitting: Family Medicine

## 2014-02-04 VITALS — BP 136/74 | HR 97 | Temp 99.1°F | Ht 64.5 in | Wt 193.0 lb

## 2014-02-04 DIAGNOSIS — D1779 Benign lipomatous neoplasm of other sites: Secondary | ICD-10-CM

## 2014-02-04 DIAGNOSIS — D171 Benign lipomatous neoplasm of skin and subcutaneous tissue of trunk: Secondary | ICD-10-CM

## 2014-02-04 DIAGNOSIS — J019 Acute sinusitis, unspecified: Secondary | ICD-10-CM | POA: Diagnosis not present

## 2014-02-04 MED ORDER — AZITHROMYCIN 250 MG PO TABS: ORAL_TABLET | ORAL | Status: DC

## 2014-02-04 MED ORDER — DICLOFENAC SODIUM 75 MG PO TBEC: 75.0000 mg | DELAYED_RELEASE_TABLET | Freq: Two times a day (BID) | ORAL | Status: DC

## 2014-02-04 NOTE — Progress Notes (Signed)
   Subjective:    Patient ID: Emily Horton, female    DOB: February 14, 1946, 68 y.o.   MRN: [id number]  HPI Here for 2 things. First for the past 6 months she has intermittent sharp pains in the left lower back that do not seem to affect her movements at all. She has felt a lump under the skin in this area. Second for 3 days she has had sinus pressure, PND, and a dry cough.    Review of Systems  Constitutional: Negative.   HENT: Positive for congestion, postnasal drip and sinus pressure.   Eyes: Negative.   Respiratory: Positive for cough.   Musculoskeletal: Positive for back pain.       Objective:   Physical Exam  Constitutional: She appears well-developed and well-nourished.  HENT:  Right Ear: External ear normal.  Left Ear: External ear normal.  Nose: Nose normal.  Mouth/Throat: Oropharynx is clear and moist.  Eyes: Conjunctivae are normal.  Neck: No thyromegaly present.  Pulmonary/Chest: Effort normal and breath sounds normal.  Musculoskeletal:  There is a firm mobile very tender lump under the skin over the left sacrum, full ROM   Lymphadenopathy:    She has no cervical adenopathy.          Assessment & Plan:  For the inflamed lipoma, use heat and Diclofenac. For the sinusitis use a Zpack

## 2014-02-04 NOTE — Progress Notes (Signed)
Pre visit review using our clinic review tool, if applicable. No additional management support is needed unless otherwise documented below in the visit note. 

## 2014-02-05 ENCOUNTER — Telehealth: Payer: Self-pay | Admitting: Family Medicine

## 2014-02-05 NOTE — Telephone Encounter (Signed)
Relevant patient education assigned to patient using Emmi. ° °

## 2014-02-18 ENCOUNTER — Ambulatory Visit
Admission: RE | Admit: 2014-02-18 | Discharge: 2014-02-18 | Disposition: A | Discharge status: Home / Self Care | Payer: Medicare Other | Source: Ambulatory Visit

## 2014-02-18 DIAGNOSIS — Z1231 Encounter for screening mammogram for malignant neoplasm of breast: Secondary | ICD-10-CM

## 2014-08-16 ENCOUNTER — Encounter: Payer: Self-pay | Admitting: Family Medicine

## 2014-08-16 ENCOUNTER — Ambulatory Visit (INDEPENDENT_AMBULATORY_CARE_PROVIDER_SITE_OTHER)
Admission: RE | Admit: 2014-08-16 | Discharge: 2014-08-16 | Disposition: A | Discharge status: Home / Self Care | Payer: Medicare Other | Source: Ambulatory Visit | Attending: Family Medicine | Admitting: Family Medicine

## 2014-08-16 ENCOUNTER — Ambulatory Visit (INDEPENDENT_AMBULATORY_CARE_PROVIDER_SITE_OTHER): Payer: Medicare Other | Admitting: Family Medicine

## 2014-08-16 VITALS — BP 116/79 | HR 88 | Temp 99.1°F | Ht 64.5 in | Wt 196.0 lb

## 2014-08-16 DIAGNOSIS — J209 Acute bronchitis, unspecified: Secondary | ICD-10-CM

## 2014-08-16 DIAGNOSIS — R05 Cough: Secondary | ICD-10-CM | POA: Diagnosis not present

## 2014-08-16 DIAGNOSIS — R059 Cough, unspecified: Secondary | ICD-10-CM | POA: Diagnosis not present

## 2014-08-16 MED ORDER — AZITHROMYCIN 250 MG PO TABS: ORAL_TABLET | ORAL | Status: DC

## 2014-08-16 MED ORDER — HYDROCODONE-HOMATROPINE 5-1.5 MG/5ML PO SYRP: 5.0000 mL | ORAL_SOLUTION | ORAL | Status: DC | PRN

## 2014-08-16 NOTE — Progress Notes (Signed)
   Subjective:    Patient ID: Emily Horton, female    DOB: 1946/04/10, 68 y.o.   MRN: [id number]  HPI Here for one month of chest congestion and a dry cough. No fevers.    Review of Systems  Constitutional: Negative.   HENT: Positive for congestion and postnasal drip. Negative for sinus pressure.   Eyes: Negative.   Respiratory: Positive for cough and chest tightness. Negative for shortness of breath and wheezing.   Cardiovascular: Negative.        Objective:   Physical Exam  Constitutional: She appears well-developed and well-nourished.  Cardiovascular: Normal rate, regular rhythm, normal heart sounds and intact distal pulses.   Pulmonary/Chest: Effort normal and breath sounds normal.          Assessment & Plan:  Bronchitis, probably due to an atypical organism. Treat with a Zpack. Get a CXR today

## 2014-08-16 NOTE — Progress Notes (Signed)
Pre visit review using our clinic review tool, if applicable. No additional management support is needed unless otherwise documented below in the visit note. 

## 2014-09-04 ENCOUNTER — Telehealth: Payer: Self-pay | Admitting: Family Medicine

## 2014-09-04 MED ORDER — AMOXICILLIN-POT CLAVULANATE 875-125 MG PO TABS: 1.0000 | ORAL_TABLET | Freq: Two times a day (BID) | ORAL | Status: DC

## 2014-09-04 NOTE — Telephone Encounter (Signed)
I sent script e-scribe and spoke with pt. 

## 2014-09-04 NOTE — Telephone Encounter (Signed)
Patient Information:  Caller Name: Emily Horton  Phone: [phone]  Patient: Emily Horton, Emily Horton  Gender: Female  DOB: 08-30-1946  Age: 68 Years  PCP: Emily Horton [facility])  Office Follow Up:  Does the office need to follow up with this patient?: Yes  Instructions For The Office: see notes  RN Note:  Pt is asking if MD could call in another different antibiotic. Advised pt, may need another appt. Assured her will send message for MD review and someone will call back with MD instructions today. Agreed to plan.  Symptoms  Reason For Call & Symptoms: Continued cough (sometimes productive for thick green sputum) with nasal drainage and chest congestion. Denies any fever. Seen in office on 9/11 and prescribed Zpack and cough syrup----at that point she states already had sxs for 1 month and now been 3 more weeks since then. Reports only mild improvement after Zpack.  Reviewed Health History In EMR: Yes  Reviewed Medications In EMR: Yes  Reviewed Allergies In EMR: Yes  Reviewed Surgeries / Procedures: Yes  Date of Onset of Symptoms: 07/16/2014  Treatments Tried: OTC cough med; Afrin nasal spray  Treatments Tried Worked: No  Guideline(s) Used:  No Protocol Available - Sick Adult  Disposition Per Guideline:   Discuss with PCP and Callback by Nurse Today  Reason For Disposition Reached:   Nursing judgment  Advice Given:  Call Back If:  New symptoms develop  You become worse.  Patient Will Follow Care Advice:  YES

## 2014-09-04 NOTE — Telephone Encounter (Signed)
Call in Augmentin 875 bid for 10 days  

## 2014-09-23 ENCOUNTER — Ambulatory Visit (INDEPENDENT_AMBULATORY_CARE_PROVIDER_SITE_OTHER): Payer: Medicare Other | Admitting: Family Medicine

## 2014-09-23 ENCOUNTER — Encounter: Payer: Self-pay | Admitting: Family Medicine

## 2014-09-23 VITALS — BP 134/95 | HR 84 | Temp 99.0°F | Ht 64.5 in | Wt 196.0 lb

## 2014-09-23 DIAGNOSIS — E785 Hyperlipidemia, unspecified: Secondary | ICD-10-CM

## 2014-09-23 DIAGNOSIS — F32A Depression, unspecified: Secondary | ICD-10-CM

## 2014-09-23 DIAGNOSIS — K219 Gastro-esophageal reflux disease without esophagitis: Secondary | ICD-10-CM | POA: Diagnosis not present

## 2014-09-23 DIAGNOSIS — F329 Major depressive disorder, single episode, unspecified: Secondary | ICD-10-CM

## 2014-09-23 LAB — POCT URINALYSIS DIPSTICK
Bilirubin, UA: NEGATIVE
Glucose, UA: NEGATIVE
Ketones, UA: NEGATIVE
Leukocytes, UA: NEGATIVE
Nitrite, UA: NEGATIVE
Protein, UA: NEGATIVE
Spec Grav, UA: 1.01
Urobilinogen, UA: 0.2
pH, UA: 6

## 2014-09-23 LAB — CBC WITH DIFFERENTIAL/PLATELET
Basophils Absolute: 0 10*3/uL (ref 0.0–0.1)
Basophils Relative: 0.5 % (ref 0.0–3.0)
Eosinophils Absolute: 0.2 10*3/uL (ref 0.0–0.7)
Eosinophils Relative: 2.4 % (ref 0.0–5.0)
HCT: 44.9 % (ref 36.0–46.0)
Hemoglobin: 14.6 g/dL (ref 12.0–15.0)
Lymphocytes Relative: 31.6 % (ref 12.0–46.0)
Lymphs Abs: 2.8 10*3/uL (ref 0.7–4.0)
MCHC: 32.5 g/dL (ref 30.0–36.0)
MCV: 95.1 fl (ref 78.0–100.0)
Monocytes Absolute: 0.6 10*3/uL (ref 0.1–1.0)
Monocytes Relative: 6.3 % (ref 3.0–12.0)
Neutro Abs: 5.2 10*3/uL (ref 1.4–7.7)
Neutrophils Relative %: 59.2 % (ref 43.0–77.0)
Platelets: 246 10*3/uL (ref 150.0–400.0)
RBC: 4.72 Mil/uL (ref 3.87–5.11)
RDW: 13.7 % (ref 11.5–15.5)
WBC: 8.7 10*3/uL (ref 4.0–10.5)

## 2014-09-23 LAB — BASIC METABOLIC PANEL
BUN: 14 mg/dL (ref 6–23)
CO2: 25 mEq/L (ref 19–32)
Calcium: 9.5 mg/dL (ref 8.4–10.5)
Chloride: 104 mEq/L (ref 96–112)
Creatinine, Ser: 1 mg/dL (ref 0.4–1.2)
GFR: 62.52 mL/min (ref >60.00)
Glucose, Bld: 108 mg/dL — ABNORMAL HIGH (ref 70–99)
Potassium: 4.2 mEq/L (ref 3.5–5.1)
Sodium: 139 mEq/L (ref 135–145)

## 2014-09-23 LAB — LIPID PANEL
Cholesterol: 190 mg/dL (ref 0–200)
HDL: 35 mg/dL — ABNORMAL LOW (ref >39.00)
LDL Cholesterol: 119 mg/dL — ABNORMAL HIGH (ref 0–99)
NonHDL: 155
Total CHOL/HDL Ratio: 5
Triglycerides: 179 mg/dL — ABNORMAL HIGH (ref 0.0–149.0)
VLDL: 35.8 mg/dL (ref 0.0–40.0)

## 2014-09-23 LAB — HEPATIC FUNCTION PANEL
ALT: 12 U/L (ref 0–35)
AST: 13 U/L (ref 0–37)
Albumin: 3.8 g/dL (ref 3.5–5.2)
Alkaline Phosphatase: 89 U/L (ref 39–117)
Bilirubin, Direct: 0.1 mg/dL (ref 0.0–0.3)
Total Bilirubin: 0.7 mg/dL (ref 0.2–1.2)
Total Protein: 7.3 g/dL (ref 6.0–8.3)

## 2014-09-23 LAB — TSH: TSH: 1.9 u[IU]/mL (ref 0.35–4.50)

## 2014-09-23 MED ORDER — BUPROPION HCL ER (XL) 300 MG PO TB24: 300.0000 mg | ORAL_TABLET | ORAL | Status: DC

## 2014-09-23 MED ORDER — DICLOFENAC SODIUM 75 MG PO TBEC: 75.0000 mg | DELAYED_RELEASE_TABLET | Freq: Two times a day (BID) | ORAL | Status: DC | PRN

## 2014-09-23 MED ORDER — DIAZEPAM 5 MG PO TABS: 5.0000 mg | ORAL_TABLET | Freq: Two times a day (BID) | ORAL | Status: DC | PRN

## 2014-09-23 MED ORDER — ATORVASTATIN CALCIUM 40 MG PO TABS: 40.0000 mg | ORAL_TABLET | Freq: Every day | ORAL | Status: DC

## 2014-09-23 MED ORDER — OMEPRAZOLE 40 MG PO CPDR: 40.0000 mg | DELAYED_RELEASE_CAPSULE | Freq: Two times a day (BID) | ORAL | Status: DC

## 2014-09-23 NOTE — Progress Notes (Signed)
Pre visit review using our clinic review tool, if applicable. No additional management support is needed unless otherwise documented below in the visit note. 

## 2014-09-23 NOTE — Progress Notes (Signed)
   Subjective:    Patient ID: Emily Horton, female    DOB: 18-Jan-1946, 68 y.o.   MRN: [id number]  HPI 68 yr old female for a cpx. She is doing well in general.    Review of Systems  Constitutional: Negative.   HENT: Negative.   Eyes: Negative.   Respiratory: Negative.   Cardiovascular: Negative.   Gastrointestinal: Negative.   Genitourinary: Negative for dysuria, urgency, frequency, hematuria, flank pain, decreased urine volume, enuresis, difficulty urinating, pelvic pain and dyspareunia.  Musculoskeletal: Negative.   Skin: Negative.   Neurological: Negative.   Psychiatric/Behavioral: Negative.        Objective:   Physical Exam  Constitutional: She is oriented to person, place, and time. She appears well-developed and well-nourished. No distress.  HENT:  Head: Normocephalic and atraumatic.  Right Ear: External ear normal.  Left Ear: External ear normal.  Nose: Nose normal.  Mouth/Throat: Oropharynx is clear and moist. No oropharyngeal exudate.  Eyes: Conjunctivae and EOM are normal. Pupils are equal, round, and reactive to light. No scleral icterus.  Neck: Normal range of motion. Neck supple. No JVD present. No thyromegaly present.  Cardiovascular: Normal rate, regular rhythm, normal heart sounds and intact distal pulses.  Exam reveals no gallop and no friction rub.   No murmur heard. EKG normal   Pulmonary/Chest: Effort normal and breath sounds normal. No respiratory distress. She has no wheezes. She has no rales. She exhibits no tenderness.  Abdominal: Soft. Bowel sounds are normal. She exhibits no distension and no mass. There is no tenderness. There is no rebound and no guarding.  Musculoskeletal: Normal range of motion. She exhibits no edema and no tenderness.  Lymphadenopathy:    She has no cervical adenopathy.  Neurological: She is alert and oriented to person, place, and time. She has normal reflexes. No cranial nerve deficit. She exhibits normal muscle tone.  Coordination normal.  Skin: Skin is warm and dry. No rash noted. No erythema.  Psychiatric: She has a normal mood and affect. Her behavior is normal. Judgment and thought content normal.          Assessment & Plan:  Well exam. Get fasting labs

## 2014-10-11 ENCOUNTER — Ambulatory Visit (INDEPENDENT_AMBULATORY_CARE_PROVIDER_SITE_OTHER): Payer: Medicare Other | Admitting: Family Medicine

## 2014-10-11 ENCOUNTER — Telehealth: Payer: Self-pay | Admitting: Family Medicine

## 2014-10-11 DIAGNOSIS — Z23 Encounter for immunization: Secondary | ICD-10-CM | POA: Diagnosis not present

## 2014-10-14 NOTE — Telephone Encounter (Signed)
done

## 2014-12-14 ENCOUNTER — Other Ambulatory Visit: Payer: Self-pay | Admitting: Family Medicine

## 2015-02-06 ENCOUNTER — Other Ambulatory Visit: Payer: Self-pay

## 2015-02-06 DIAGNOSIS — Z1231 Encounter for screening mammogram for malignant neoplasm of breast: Secondary | ICD-10-CM

## 2015-02-25 ENCOUNTER — Ambulatory Visit
Admission: RE | Admit: 2015-02-25 | Discharge: 2015-02-25 | Disposition: A | Discharge status: Home / Self Care | Payer: Medicare Other | Source: Ambulatory Visit

## 2015-02-25 DIAGNOSIS — Z1231 Encounter for screening mammogram for malignant neoplasm of breast: Secondary | ICD-10-CM

## 2015-03-04 DIAGNOSIS — H0014 Chalazion left upper eyelid: Secondary | ICD-10-CM | POA: Diagnosis not present

## 2015-03-04 DIAGNOSIS — H25813 Combined forms of age-related cataract, bilateral: Secondary | ICD-10-CM | POA: Diagnosis not present

## 2015-07-01 ENCOUNTER — Encounter: Payer: Self-pay | Admitting: Gastroenterology

## 2015-09-03 ENCOUNTER — Ambulatory Visit (INDEPENDENT_AMBULATORY_CARE_PROVIDER_SITE_OTHER)
Admission: RE | Admit: 2015-09-03 | Discharge: 2015-09-03 | Disposition: A | Discharge status: Home / Self Care | Payer: Medicare Other | Source: Ambulatory Visit | Attending: Physician Assistant | Admitting: Physician Assistant

## 2015-09-03 ENCOUNTER — Encounter: Payer: Self-pay | Admitting: Physician Assistant

## 2015-09-03 ENCOUNTER — Ambulatory Visit (INDEPENDENT_AMBULATORY_CARE_PROVIDER_SITE_OTHER): Payer: Medicare Other | Admitting: Physician Assistant

## 2015-09-03 ENCOUNTER — Other Ambulatory Visit (INDEPENDENT_AMBULATORY_CARE_PROVIDER_SITE_OTHER): Payer: Medicare Other

## 2015-09-03 VITALS — BP 124/78 | HR 92 | Temp 98.8°F | Ht 64.0 in | Wt 188.4 lb

## 2015-09-03 DIAGNOSIS — R1032 Left lower quadrant pain: Secondary | ICD-10-CM

## 2015-09-03 DIAGNOSIS — R509 Fever, unspecified: Secondary | ICD-10-CM | POA: Diagnosis not present

## 2015-09-03 DIAGNOSIS — R300 Dysuria: Secondary | ICD-10-CM

## 2015-09-03 LAB — BASIC METABOLIC PANEL
BUN: 18 mg/dL (ref 6–23)
CO2: 28 mEq/L (ref 19–32)
Calcium: 9.7 mg/dL (ref 8.4–10.5)
Chloride: 105 mEq/L (ref 96–112)
Creatinine, Ser: 0.94 mg/dL (ref 0.40–1.20)
GFR: 63.1 mL/min (ref >60.00)
Glucose, Bld: 107 mg/dL — ABNORMAL HIGH (ref 70–99)
Potassium: 4 mEq/L (ref 3.5–5.1)
Sodium: 140 mEq/L (ref 135–145)

## 2015-09-03 LAB — URINALYSIS
Bilirubin Urine: NEGATIVE
Ketones, ur: NEGATIVE
Leukocytes, UA: NEGATIVE
Nitrite: NEGATIVE
Specific Gravity, Urine: 1.02 (ref 1.000–1.030)
Total Protein, Urine: NEGATIVE
Urine Glucose: NEGATIVE
Urobilinogen, UA: 0.2 (ref 0.0–1.0)
pH: 6 (ref 5.0–8.0)

## 2015-09-03 LAB — CBC WITH DIFFERENTIAL/PLATELET
Basophils Absolute: 0 10*3/uL (ref 0.0–0.1)
Basophils Relative: 0.3 % (ref 0.0–3.0)
Eosinophils Absolute: 0.2 10*3/uL (ref 0.0–0.7)
Eosinophils Relative: 2 % (ref 0.0–5.0)
HCT: 40.4 % (ref 36.0–46.0)
Hemoglobin: 13.6 g/dL (ref 12.0–15.0)
Lymphocytes Relative: 22 % (ref 12.0–46.0)
Lymphs Abs: 2.6 10*3/uL (ref 0.7–4.0)
MCHC: 33.7 g/dL (ref 30.0–36.0)
MCV: 93.4 fl (ref 78.0–100.0)
Monocytes Absolute: 0.8 10*3/uL (ref 0.1–1.0)
Monocytes Relative: 7.1 % (ref 3.0–12.0)
Neutro Abs: 8 10*3/uL — ABNORMAL HIGH (ref 1.4–7.7)
Neutrophils Relative %: 68.6 % (ref 43.0–77.0)
Platelets: 247 10*3/uL (ref 150.0–400.0)
RBC: 4.33 Mil/uL (ref 3.87–5.11)
RDW: 13.6 % (ref 11.5–15.5)
WBC: 11.7 10*3/uL — ABNORMAL HIGH (ref 4.0–10.5)

## 2015-09-03 MED ORDER — METRONIDAZOLE 500 MG PO TABS
500.0000 mg | ORAL_TABLET | Freq: Two times a day (BID) | ORAL | Status: DC
Start: 2015-09-03 — End: 2015-09-03

## 2015-09-03 MED ORDER — METRONIDAZOLE 500 MG PO TABS: 500.0000 mg | ORAL_TABLET | Freq: Two times a day (BID) | ORAL | Status: DC

## 2015-09-03 MED ORDER — CIPROFLOXACIN HCL 500 MG PO TABS: 500.0000 mg | ORAL_TABLET | Freq: Two times a day (BID) | ORAL | Status: DC

## 2015-09-03 MED ORDER — IOHEXOL 300 MG/ML  SOLN: 100.0000 mL | Freq: Once | INTRAMUSCULAR | Status: DC | PRN

## 2015-09-03 NOTE — Progress Notes (Signed)
Agree with initial assessment and plans as outlined. Amy Esterwood we'll follow-up on imaging and laboratories as noted

## 2015-09-03 NOTE — Progress Notes (Signed)
Patient ID: Emily Horton, female   DOB: [birth year], 69 y.o.   MRN: [id number]   Subjective:    Patient ID: Emily Horton, female    DOB: 1946-02-26, 69 y.o.   MRN: [id number]  HPI Emily Horton is a very nice 69 year old white female former patient of Dr. Buel Horton who was last seen here in 2013. She comes in today with new complaints of lower and left-sided abdominal pain and painful defecation and urination over the past 2 days. She says she had onset 2 nights ago with abdominal bloating and soreness during the night. She says she usually has to get up at night to urinate and when she did she noticed that she had an increase in lower abdominal discomfort. She is was unable to have a bowel movement despite straining and says when she tried to strain she had excruciating pain in the left lower quadrant. That night she also had a temp of around 100 some sweats and chills. Today she continued to have pain in her lower abdomen and pressure when urinating, no bowel movement. She has been able to eat without any increase in discomfort and denies any nausea or vomiting. He was able to have small bowel movement today no diarrhea but hadn't definite increase in pain with her bowel movement. She has no history of diverticulitis or kidney stones. Last EGD was done in July 2013 and was normal. Distal esophagus was biopsied and was negative for Barrett's. Colonoscopy in July 2013 showed mild diverticulosis and multiple small polyps were removed on the one was a tubular adenoma and the others hyperplastic.  Review of Systems Pertinent positive and negative review of systems were noted in the above HPI section.  All other review of systems was otherwise negative.  Outpatient Encounter Prescriptions as of 09/03/2015  Medication Sig  . buPROPion (WELLBUTRIN XL) 300 MG 24 hr tablet Take 1 tablet (300 mg total) by mouth every morning.  . diazepam (VALIUM) 5 MG tablet Take 1 tablet (5 mg total) by mouth every 12 (twelve) hours as  needed for anxiety.  . diclofenac (VOLTAREN) 75 MG EC tablet Take 1 tablet (75 mg total) by mouth 2 (two) times daily as needed for moderate pain.  . Multiple Vitamin (MULTIVITAMIN) capsule Take 1 capsule by mouth daily.    Marland Kitchen omeprazole (PRILOSEC) 40 MG capsule Take 1 capsule (40 mg total) by mouth 2 (two) times daily. TAKE 1 CAPSULE BY MOUTH DAILY  . ciprofloxacin (CIPRO) 500 MG tablet Take 1 tablet (500 mg total) by mouth 2 (two) times daily.  . metroNIDAZOLE (FLAGYL) 500 MG tablet Take 1 tablet (500 mg total) by mouth 2 (two) times daily.  . [DISCONTINUED] atorvastatin (LIPITOR) 40 MG tablet Take 1 tablet (40 mg total) by mouth daily.  . [DISCONTINUED] ciprofloxacin (CIPRO) 500 MG tablet Take 1 tablet (500 mg total) by mouth 2 (two) times daily.  . [DISCONTINUED] HYDROcodone-homatropine (HYDROMET) 5-1.5 MG/5ML syrup Take 5 mLs by mouth every 4 (four) hours as needed.  . [DISCONTINUED] metroNIDAZOLE (FLAGYL) 500 MG tablet Take 1 tablet (500 mg total) by mouth 2 (two) times daily.   No facility-administered encounter medications on file as of 09/03/2015.   Allergies  Allergen Reactions  . Eggs Or Egg-Derived Products     Hives   . Zoloft [Sertraline Hcl]     Headaches    Patient Active Problem List   Diagnosis Date Noted  . ACUTE BRONCHITIS 11/15/2007  . Hyperlipidemia 08/22/2007  . Depression 08/22/2007  .  GERD 07/04/2007   Social History   Social History  . Marital Status: Married    Spouse Name: N/A  . Number of Children: 2  . Years of Education: N/A   Occupational History  . retired Pharmacist, hospital    Social History Main Topics  . Smoking status: Current Every Day Smoker -- 0.50 packs/day for 30 years    Types: Cigarettes  . Smokeless tobacco: Never Used     Comment: or less  . Alcohol Use: 0.0 oz/week    0 Standard drinks or equivalent per week     Comment: occasional  . Drug Use: No  . Sexual Activity: Not on file   Other Topics Concern  . Not on file   Social  History Narrative    Emily Horton's family history includes Breast cancer in her maternal grandmother; Colon polyps in her father; Crohn's disease in her father; Stroke in her paternal grandfather.      Objective:    Filed Vitals:   09/03/15 0831  BP: 124/78  Pulse: 92  Temp: 98.8 F (37.1 C)    Physical Exam Well-developed older white female in no acute distress, pleasant blood pressure 124/78 pulse 92 temp 98 8 height 5 foot 4, weight 188. HEENT; nontraumatic normocephalic EOMI PERRLA sclera anicteric, Neck; supple no JVD, Cardiovascular; regular rate and rhythm with S1-S2 no murmur or gallop, Pulmonary ;clear bilaterally abdomen soft bowel sounds are present she is tender in the left lower quadrant and suprapubic area there is no guarding or rebound no palpable mass or hepatosplenomegaly rectal exam not done, Ext; no clubbing cyanosis or edema skin warm and dry, Neuropsych; mood and affect appropriate      Assessment & Plan:   #1 69 yo female with 2 day hx of acute lower abdominal pain, low grade fever/sweats, dysuria and sharp increase in pain with straining  for BM.  R/O acute diverticulitis R/O UTI, ureterolithiasis  #2 hx of adenomatous polyp 2013 #3 hx of diverticulosis #4 GERD  Plan; Ct abd/pelvis today  Cbc, BMET, UA  Tylenol q 6-8 hours as needed for pain Will send Rx for Cipro 500 mg po BID and Flagyl 500 mg po BID both x 14 days as suspect diverticulitis- will review Ct later today and alter  plan as indicated   Pt will be established with Dr Emily Gab PA-C 09/03/2015   Cc: Emily Morale, MD

## 2015-09-03 NOTE — Patient Instructions (Addendum)
You have been given a separate informational sheet regarding your tobacco use, the importance of quitting and local resources to help you quit. Please go to the basement level to have your labs drawn.   We sent prescriptions to [facility].   1. Cipro 2. Flagyl  Take Tylenol 2 tab every 6 hours as needed for pain.  You have been scheduled for a CT scan of the abdomen and pelvis at [facility] ([address]---this is in the same building as [facility]).   You are scheduled today Wednesday 09-03-2015 at 1:15 PM. You should arrive at 1:15 minutes prior to your appointment time for registration. Please follow the written instructions below on the day of your exam:  WARNING: IF YOU ARE ALLERGIC TO IODINE/X-RAY DYE, PLEASE NOTIFY RADIOLOGY IMMEDIATELY AT [phone]! YOU WILL BE GIVEN A 13 HOUR PREMEDICATION PREP.  1) Do not eat anything until after the test today. after  9:15 am 2) You have been given 2 bottles of oral contrast to drink. The solution may taste  better if refrigerated, but do NOT add ice or any other liquid to this solution. Shake well before drinking.    Drink 1 bottle of contrast @ 11:15 am(2 hours prior to your exam)  Drink 1 bottle of contrast @ 12:15 PM (1 hour prior to your exam)  You may take any medications as prescribed with a small amount of water except for the following: Metformin, Glucophage, Glucovance, Avandamet, Riomet, Fortamet, Actoplus Met, Janumet, Glumetza or Metaglip. The above medications must be held the day of the exam AND 48 hours after the exam.  The purpose of you drinking the oral contrast is to aid in the visualization of your intestinal tract. The contrast solution may cause some diarrhea. Before your exam is started, you will be given a small amount of fluid to drink. Depending on your individual set of symptoms, you may also receive an intravenous injection of x-ray contrast/dye. Plan on being at [facility] for  30 minutes or long, depending on the type of exam you are having performed.  If you have any questions regarding your exam or if you need to reschedule, you may call the CT department at [phone] between the hours of 8:00 am and 5:00 pm, Monday-Friday.  ________________________________________________________________________

## 2015-09-25 ENCOUNTER — Other Ambulatory Visit: Payer: Self-pay | Admitting: Family Medicine

## 2015-09-30 ENCOUNTER — Encounter: Payer: Self-pay | Admitting: Physician Assistant

## 2015-09-30 ENCOUNTER — Ambulatory Visit (INDEPENDENT_AMBULATORY_CARE_PROVIDER_SITE_OTHER): Payer: Medicare Other | Admitting: Physician Assistant

## 2015-09-30 VITALS — BP 114/80 | HR 84 | Ht 64.0 in | Wt 189.0 lb

## 2015-09-30 DIAGNOSIS — K5732 Diverticulitis of large intestine without perforation or abscess without bleeding: Secondary | ICD-10-CM

## 2015-09-30 DIAGNOSIS — R938 Abnormal findings on diagnostic imaging of other specified body structures: Secondary | ICD-10-CM

## 2015-09-30 DIAGNOSIS — K5730 Diverticulosis of large intestine without perforation or abscess without bleeding: Secondary | ICD-10-CM | POA: Diagnosis not present

## 2015-09-30 DIAGNOSIS — R9389 Abnormal findings on diagnostic imaging of other specified body structures: Secondary | ICD-10-CM

## 2015-09-30 MED ORDER — NA SULFATE-K SULFATE-MG SULF 17.5-3.13-1.6 GM/177ML PO SOLN: 1.0000 | Freq: Once | ORAL | Status: AC

## 2015-09-30 NOTE — Progress Notes (Signed)
Reviewed.    Agree.

## 2015-09-30 NOTE — Progress Notes (Signed)
Patient ID: Emily Horton, female   DOB: 1946/05/21, 69 y.o.   MRN: [id number]   Subjective:    Patient ID: Emily Horton, female    DOB: 31-Oct-1946, 69 y.o.   MRN: [id number]  HPI Emily Horton is a pleasant 69 year old white female who was seen in the office about a month ago by myself. She is a prior patient of Dr. Buel Ream now established with Dr. Henrene Pastor. At time of last office visit she was diagnosed with diverticulitis. She had undergone CT scan at that time which showed acute sigmoid diverticulitis with masslike thickening of the colon wall in the area of diverticulitis cannot exclude neoplasm versus acute and chronic inflammation.   Patient was treated with a 14 day course of Cipro and Flagyl. She says she is better at this point and feeling no abdominal pain. Her bowel movements have been normal she has not noted any melena or hematochezia. Last colonoscopy July 2013 showed mild sigmoid diverticulosis multiple polyps were removed one was a tubular adenoma and the others hyperplastic, and recommended for 5 year interval follow-up.   Review of Systems Pertinent positive and negative review of systems were noted in the above HPI section.  All other review of systems was otherwise negative.  Outpatient Encounter Prescriptions as of 09/30/2015  Medication Sig  . atorvastatin (LIPITOR) 40 MG tablet TAKE 1 TABLET BY MOUTH DAILY  . buPROPion (WELLBUTRIN XL) 300 MG 24 hr tablet TAKE 1 TABLET BY MOUTH EVERY MORNING  . diazepam (VALIUM) 5 MG tablet Take 1 tablet (5 mg total) by mouth every 12 (twelve) hours as needed for anxiety.  . diclofenac (VOLTAREN) 75 MG EC tablet TAKE 1 TABLET BY MOUTH TWICE DAILY AS NEEDED FOR MODERATE PAIN  . Multiple Vitamin (MULTIVITAMIN) capsule Take 1 capsule by mouth daily.    Marland Kitchen omeprazole (PRILOSEC) 40 MG capsule TAKE 1 CAPSULE BY MOUTH TWICE DAILY  . Na Sulfate-K Sulfate-Mg Sulf SOLN Take 1 kit by mouth once.  . [DISCONTINUED] ciprofloxacin (CIPRO) 500 MG tablet Take 1 tablet  (500 mg total) by mouth 2 (two) times daily.  . [DISCONTINUED] metroNIDAZOLE (FLAGYL) 500 MG tablet Take 1 tablet (500 mg total) by mouth 2 (two) times daily.   No facility-administered encounter medications on file as of 09/30/2015.   Allergies  Allergen Reactions  . Eggs Or Egg-Derived Products     Hives   . Zoloft [Sertraline Hcl]     Headaches    Patient Active Problem List   Diagnosis Date Noted  . Diverticulosis of colon without hemorrhage 09/30/2015  . ACUTE BRONCHITIS 11/15/2007  . Hyperlipidemia 08/22/2007  . Depression 08/22/2007  . GERD 07/04/2007   Social History   Social History  . Marital Status: Married    Spouse Name: N/A  . Number of Children: 2  . Years of Education: N/A   Occupational History  . retired Pharmacist, hospital    Social History Main Topics  . Smoking status: Current Every Day Smoker -- 0.50 packs/day for 30 years    Types: Cigarettes  . Smokeless tobacco: Never Used     Comment: or less  . Alcohol Use: 0.0 oz/week    0 Standard drinks or equivalent per week     Comment: occasional  . Drug Use: No  . Sexual Activity: Not on file   Other Topics Concern  . Not on file   Social History Narrative    Ms. Moncrieffe's family history includes Breast cancer in her maternal grandmother; Colon polyps in her  father; Crohn's disease in her father; Stroke in her paternal grandfather.      Objective:    Filed Vitals:   09/30/15 0833  BP: 114/80  Pulse: 84    Physical Exam  well-developed older white female in no acute distress, pleasant blood pressure 114/80 pulse 84 height 5 foot 4 weight 189. HEENT; nontraumatic normocephalic EOMI PERRLA sclera anicteric, Cardiovascular; regular rate and rhythm with S1-S2 no murmur or gallop, Pulmonary; clear bilaterally, Abdomen; soft bowel sounds are present no palpable mass or hepatosplenomegaly she's nontender, Rectal; exam not done, Extremities; no clubbing cyanosis or edema skin warm and dry, Neuropsych ;mood and  affect appropriate       Assessment & Plan:   #1 69 yo female with recent episode of diverticulitis - resolved #2 Abnormal CT scan of abdomen  With mass like thickening of sigmoid colon -r/o neoplasm vs acute and chronic inflammation #3 hx of adenomatous colon polyp 2013  Plan; High fiber diet, Miralax prn to avoid constipation  Will schedule for Colonoscopy with Dr. Henrene Pastor -procedure discussed in detail with pt and she is agreeable to proceed  She will call in the interim for any recurrent abdominal pain etc   Hevin Jeffcoat Genia Harold PA-C 09/30/2015   Cc: Laurey Morale, MD

## 2015-09-30 NOTE — Patient Instructions (Signed)
We sent a prescription to [address] for the colonoscopy prep.   You have been scheduled for a colonoscopy. Please follow written instructions given to you at your visit today.  If you use inhalers (even only as needed), please bring them with you on the day of your procedure. Your physician has requested that you go to www.[email or web address].com and enter the access code given to you at your visit today. This web site gives a general overview about your procedure. However, you should still follow specific instructions given to you by our office regarding your preparation for the procedure.

## 2015-10-14 ENCOUNTER — Encounter: Payer: Self-pay | Admitting: Family Medicine

## 2015-10-14 ENCOUNTER — Ambulatory Visit (INDEPENDENT_AMBULATORY_CARE_PROVIDER_SITE_OTHER): Payer: Medicare Other | Admitting: Family Medicine

## 2015-10-14 VITALS — BP 130/85 | HR 80 | Temp 98.2°F | Ht 64.0 in | Wt 190.0 lb

## 2015-10-14 DIAGNOSIS — F32A Depression, unspecified: Secondary | ICD-10-CM

## 2015-10-14 DIAGNOSIS — K219 Gastro-esophageal reflux disease without esophagitis: Secondary | ICD-10-CM | POA: Diagnosis not present

## 2015-10-14 DIAGNOSIS — E785 Hyperlipidemia, unspecified: Secondary | ICD-10-CM | POA: Diagnosis not present

## 2015-10-14 DIAGNOSIS — Z209 Contact with and (suspected) exposure to unspecified communicable disease: Secondary | ICD-10-CM | POA: Diagnosis not present

## 2015-10-14 DIAGNOSIS — F329 Major depressive disorder, single episode, unspecified: Secondary | ICD-10-CM

## 2015-10-14 LAB — CBC WITH DIFFERENTIAL/PLATELET
Basophils Absolute: 0 10*3/uL (ref 0.0–0.1)
Basophils Relative: 0.4 % (ref 0.0–3.0)
Eosinophils Absolute: 0.2 10*3/uL (ref 0.0–0.7)
Eosinophils Relative: 2.2 % (ref 0.0–5.0)
HCT: 43.5 % (ref 36.0–46.0)
Hemoglobin: 14.5 g/dL (ref 12.0–15.0)
Lymphocytes Relative: 33.4 % (ref 12.0–46.0)
Lymphs Abs: 3 10*3/uL (ref 0.7–4.0)
MCHC: 33.2 g/dL (ref 30.0–36.0)
MCV: 92.8 fl (ref 78.0–100.0)
Monocytes Absolute: 0.6 10*3/uL (ref 0.1–1.0)
Monocytes Relative: 6.9 % (ref 3.0–12.0)
Neutro Abs: 5.2 10*3/uL (ref 1.4–7.7)
Neutrophils Relative %: 57.1 % (ref 43.0–77.0)
Platelets: 251 10*3/uL (ref 150.0–400.0)
RBC: 4.69 Mil/uL (ref 3.87–5.11)
RDW: 13.8 % (ref 11.5–15.5)
WBC: 9.1 10*3/uL (ref 4.0–10.5)

## 2015-10-14 LAB — LIPID PANEL
Cholesterol: 182 mg/dL (ref 0–200)
HDL: 41.1 mg/dL (ref >39.00)
LDL Cholesterol: 107 mg/dL — ABNORMAL HIGH (ref 0–99)
NonHDL: 140.87
Total CHOL/HDL Ratio: 4
Triglycerides: 170 mg/dL — ABNORMAL HIGH (ref 0.0–149.0)
VLDL: 34 mg/dL (ref 0.0–40.0)

## 2015-10-14 LAB — BASIC METABOLIC PANEL
BUN: 14 mg/dL (ref 6–23)
CO2: 28 mEq/L (ref 19–32)
Calcium: 9.9 mg/dL (ref 8.4–10.5)
Chloride: 104 mEq/L (ref 96–112)
Creatinine, Ser: 0.89 mg/dL (ref 0.40–1.20)
GFR: 67.19 mL/min (ref >60.00)
Glucose, Bld: 91 mg/dL (ref 70–99)
Potassium: 4.1 mEq/L (ref 3.5–5.1)
Sodium: 140 mEq/L (ref 135–145)

## 2015-10-14 LAB — POCT URINALYSIS DIPSTICK
Bilirubin, UA: NEGATIVE
Glucose, UA: NEGATIVE
Ketones, UA: NEGATIVE
Nitrite, UA: NEGATIVE
Protein, UA: NEGATIVE
Spec Grav, UA: 1.02
Urobilinogen, UA: 0.2
pH, UA: 5.5

## 2015-10-14 LAB — HEPATIC FUNCTION PANEL
ALT: 7 U/L (ref 0–35)
AST: 9 U/L (ref 0–37)
Albumin: 4.4 g/dL (ref 3.5–5.2)
Alkaline Phosphatase: 87 U/L (ref 39–117)
Bilirubin, Direct: 0.1 mg/dL (ref 0.0–0.3)
Total Bilirubin: 0.6 mg/dL (ref 0.2–1.2)
Total Protein: 6.9 g/dL (ref 6.0–8.3)

## 2015-10-14 LAB — TSH: TSH: 1.83 u[IU]/mL (ref 0.35–4.50)

## 2015-10-14 IMAGING — MG MM SCREENING BREAST TOMO BILATERAL
8 series · 8 of 24 positions shown · non-contrast
Comparison: Previous exam(s).

CLINICAL DATA: Screening.

EXAM:
DIGITAL SCREENING BILATERAL MAMMOGRAM WITH 3D TOMO WITH CAD

[L MLO]
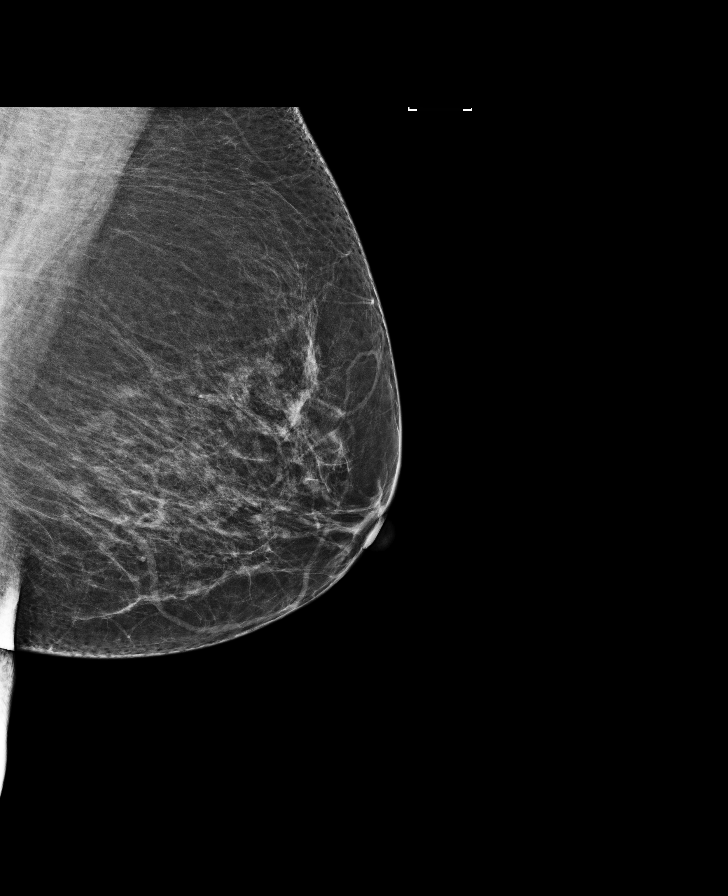

[L CC]
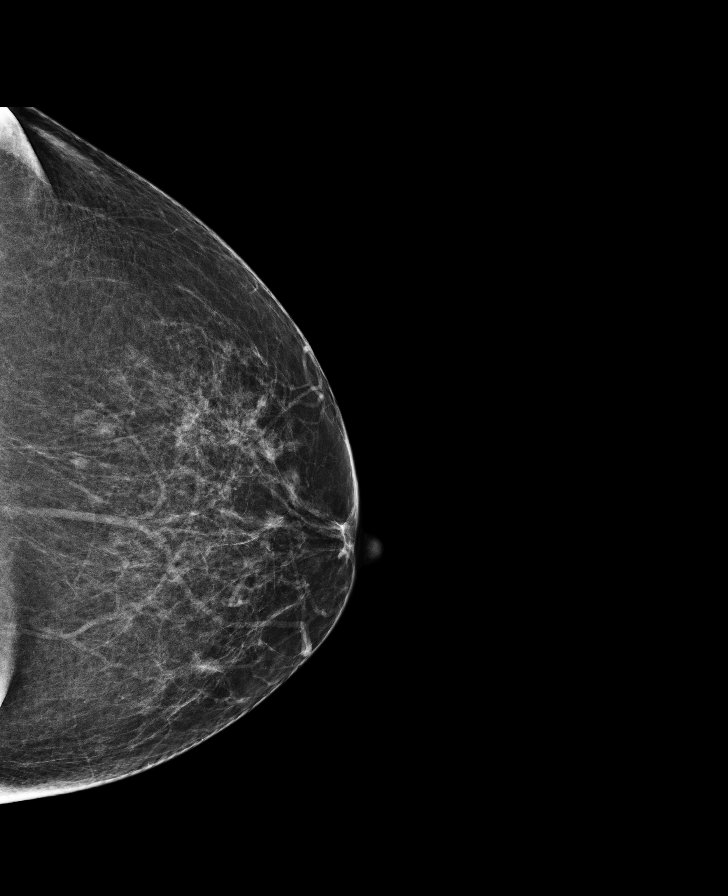

[R MLO]
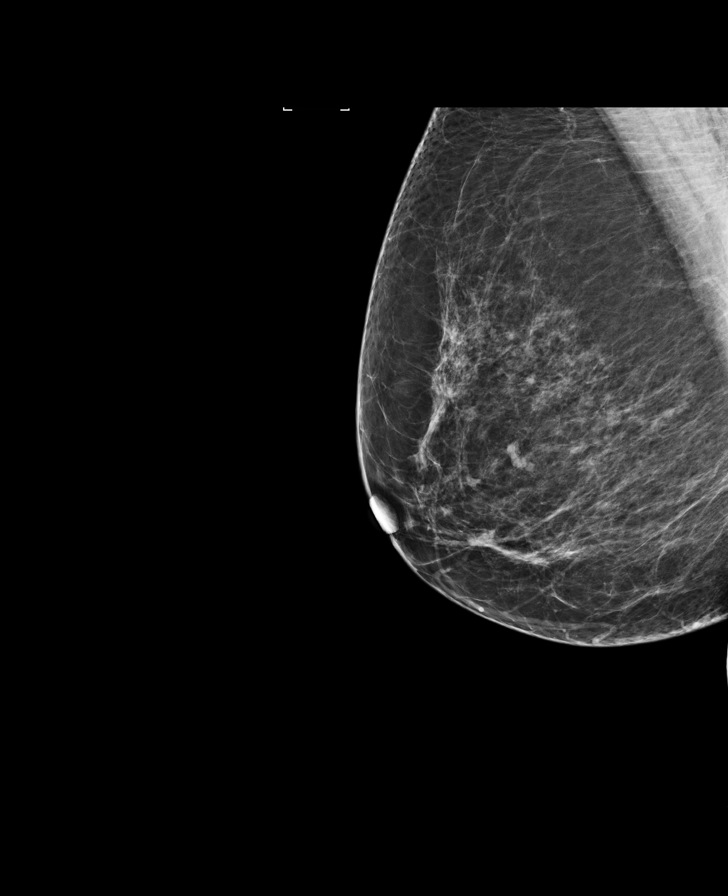

[R CC]
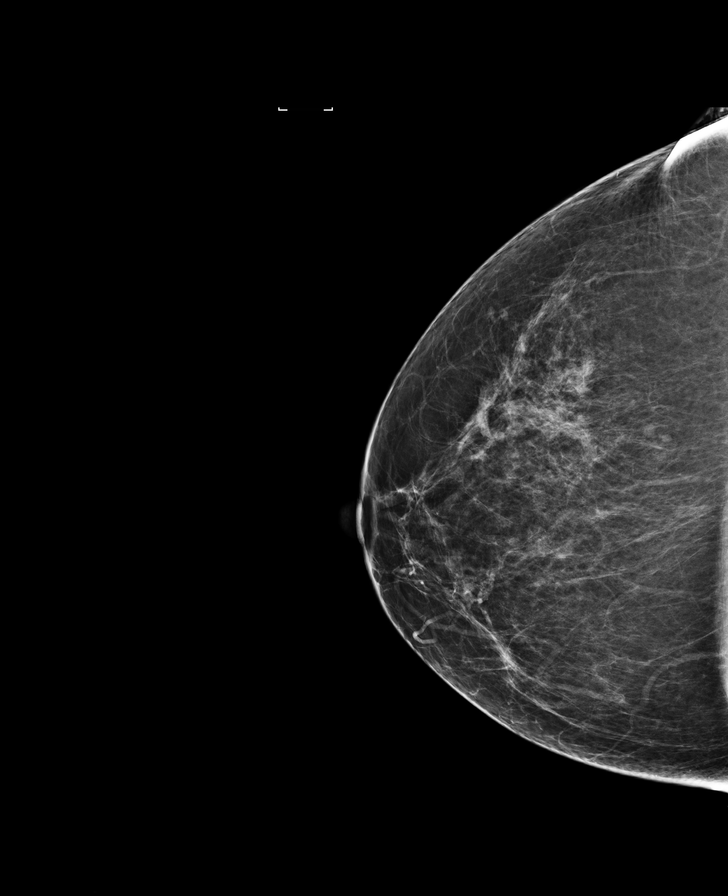

[R MLO tomo · tomo slice 39/78.0]
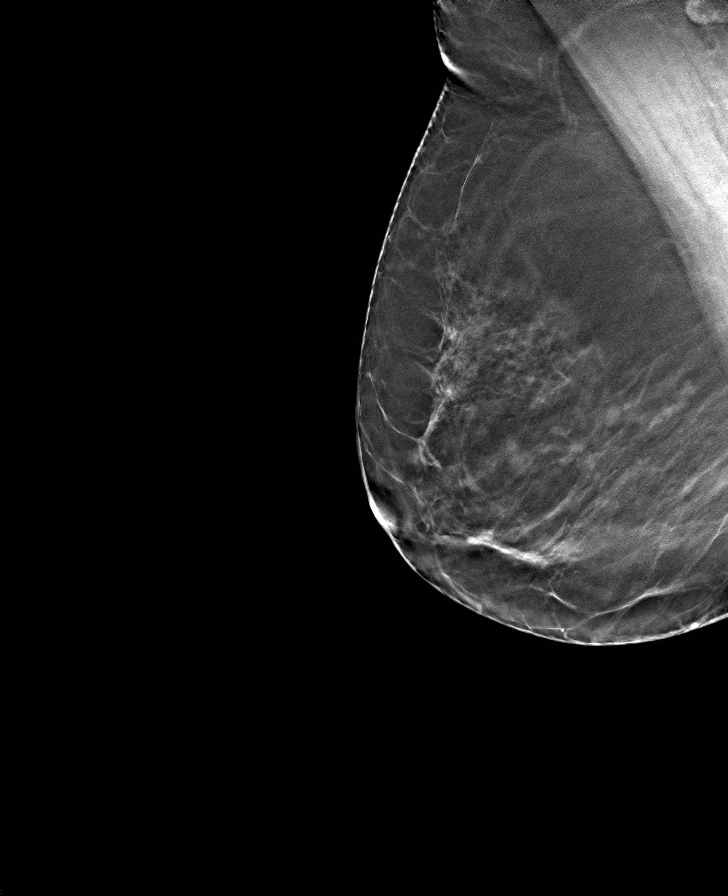

[R CC tomo · tomo slice 37/74.0]
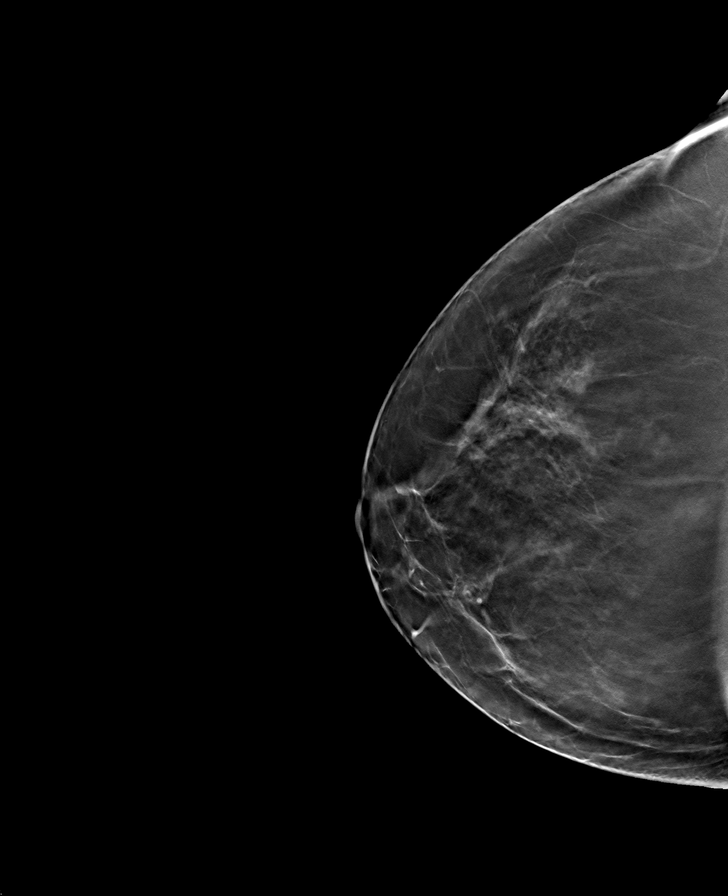

[L MLO tomo · tomo slice 41/80.0]
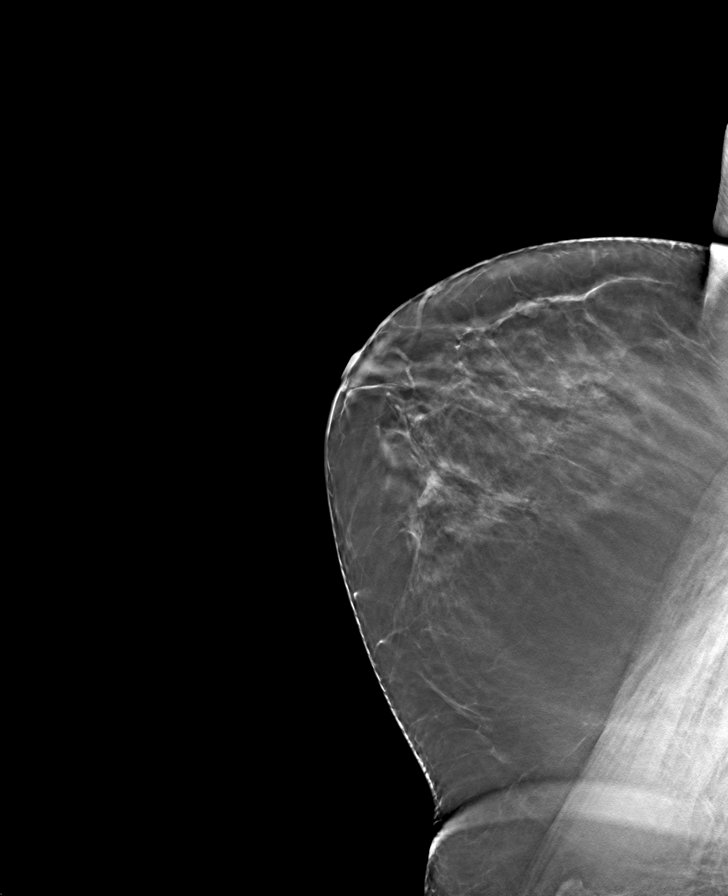

[L CC tomo · tomo slice 38/75.0]
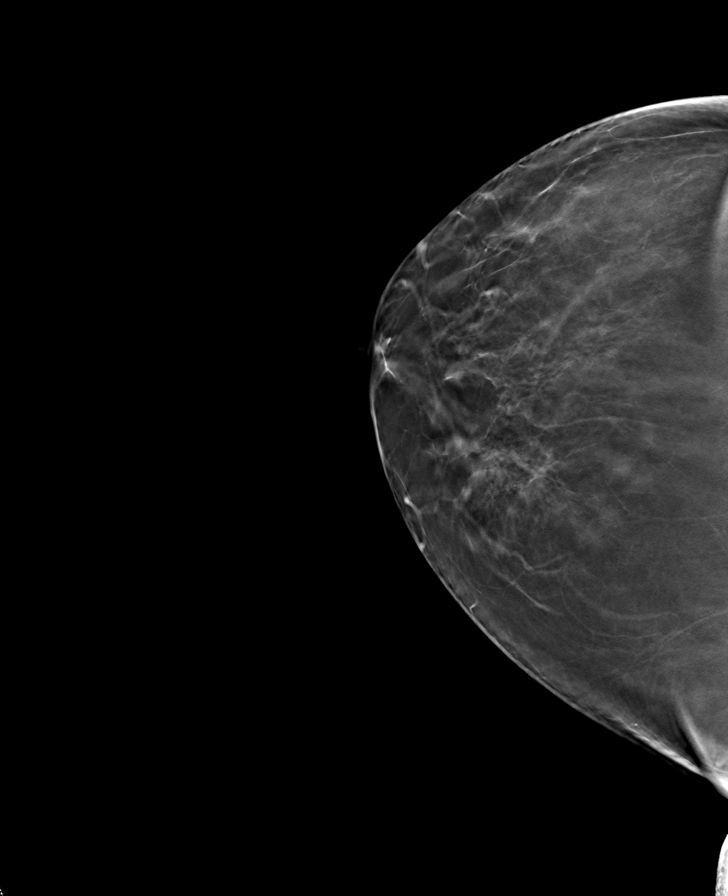

[8 of 24 positions shown; findings below may reference images not displayed]

ACR Breast Density Category b: There are scattered areas of
fibroglandular density.
FINDINGS: There are no findings suspicious for malignancy. Images were
processed with CAD.
IMPRESSION: No mammographic evidence of malignancy. A result letter of this
screening mammogram will be mailed directly to the patient.

RECOMMENDATION:
Screening mammogram in one year. (Code:55-L-23V)

BI-RADS CATEGORY  1: Negative.

## 2015-10-14 MED ORDER — DIAZEPAM 5 MG PO TABS: 5.0000 mg | ORAL_TABLET | Freq: Two times a day (BID) | ORAL | Status: DC | PRN

## 2015-10-14 NOTE — Progress Notes (Signed)
   Subjective:    Patient ID: Emily Horton, female    DOB: 23-Kaleen-1947, 69 y.o.   MRN: [id number]  HPI 69 yr old female for a cpx. She feels well physically but she is having a rough time emotionally. She is still caring for her demented husband and this is very stressful. She had worked with a therapist a few years ago but she has not seen them since then.    Review of Systems  Constitutional: Negative.   HENT: Negative.   Eyes: Negative.   Respiratory: Negative.   Cardiovascular: Negative.   Gastrointestinal: Negative.   Genitourinary: Negative for dysuria, urgency, frequency, hematuria, flank pain, decreased urine volume, enuresis, difficulty urinating, pelvic pain and dyspareunia.  Musculoskeletal: Negative.   Skin: Negative.   Neurological: Negative.   Psychiatric/Behavioral: Negative.        Objective:   Physical Exam  Constitutional: She is oriented to person, place, and time. She appears well-developed and well-nourished. No distress.  HENT:  Head: Normocephalic and atraumatic.  Right Ear: External ear normal.  Left Ear: External ear normal.  Nose: Nose normal.  Mouth/Throat: Oropharynx is clear and moist. No oropharyngeal exudate.  Eyes: Conjunctivae and EOM are normal. Pupils are equal, round, and reactive to light. No scleral icterus.  Neck: Normal range of motion. Neck supple. No JVD present. No thyromegaly present.  Cardiovascular: Normal rate, regular rhythm, normal heart sounds and intact distal pulses.  Exam reveals no gallop and no friction rub.   No murmur heard. EKG normal   Pulmonary/Chest: Effort normal and breath sounds normal. No respiratory distress. She has no wheezes. She has no rales. She exhibits no tenderness.  Abdominal: Soft. Bowel sounds are normal. She exhibits no distension and no mass. There is no tenderness. There is no rebound and no guarding.  Musculoskeletal: Normal range of motion. She exhibits no edema or tenderness.  Lymphadenopathy:   She has no cervical adenopathy.  Neurological: She is alert and oriented to person, place, and time. She has normal reflexes. No cranial nerve deficit. She exhibits normal muscle tone. Coordination normal.  Skin: Skin is warm and dry. No rash noted. No erythema.  Psychiatric: She has a normal mood and affect. Her behavior is normal. Judgment and thought content normal.          Assessment & Plan:  Well exam. We discussed diet and exercise . Get fasting labs. I encouraged her to see her therapist again soon.

## 2015-10-14 NOTE — Progress Notes (Signed)
Pre visit review using our clinic review tool, if applicable. No additional management support is needed unless otherwise documented below in the visit note. 

## 2015-10-15 LAB — HEPATITIS C ANTIBODY: HCV Ab: NEGATIVE

## 2015-10-17 ENCOUNTER — Other Ambulatory Visit: Payer: Self-pay | Admitting: Family Medicine

## 2015-10-17 MED ORDER — CIPROFLOXACIN HCL 500 MG PO TABS: 500.0000 mg | ORAL_TABLET | Freq: Two times a day (BID) | ORAL | Status: DC

## 2015-12-15 DIAGNOSIS — H1089 Other conjunctivitis: Secondary | ICD-10-CM | POA: Diagnosis not present

## 2015-12-15 DIAGNOSIS — J019 Acute sinusitis, unspecified: Secondary | ICD-10-CM | POA: Diagnosis not present

## 2015-12-16 ENCOUNTER — Encounter: Payer: Medicare Other | Admitting: Internal Medicine

## 2015-12-30 ENCOUNTER — Ambulatory Visit (AMBULATORY_SURGERY_CENTER): Payer: Medicare Other | Admitting: Internal Medicine

## 2015-12-30 ENCOUNTER — Encounter: Payer: Self-pay | Admitting: Internal Medicine

## 2015-12-30 VITALS — BP 110/82 | HR 86 | Temp 97.4°F | Resp 20 | Ht 64.0 in | Wt 189.0 lb

## 2015-12-30 DIAGNOSIS — K635 Polyp of colon: Secondary | ICD-10-CM

## 2015-12-30 DIAGNOSIS — D124 Benign neoplasm of descending colon: Secondary | ICD-10-CM

## 2015-12-30 DIAGNOSIS — R935 Abnormal findings on diagnostic imaging of other abdominal regions, including retroperitoneum: Secondary | ICD-10-CM | POA: Diagnosis not present

## 2015-12-30 DIAGNOSIS — K5732 Diverticulitis of large intestine without perforation or abscess without bleeding: Secondary | ICD-10-CM | POA: Diagnosis not present

## 2015-12-30 DIAGNOSIS — Z86010 Personal history of colonic polyps: Secondary | ICD-10-CM | POA: Diagnosis not present

## 2015-12-30 DIAGNOSIS — R933 Abnormal findings on diagnostic imaging of other parts of digestive tract: Secondary | ICD-10-CM | POA: Diagnosis not present

## 2015-12-30 HISTORY — PX: COLONOSCOPY: SHX174

## 2015-12-30 MED ORDER — SODIUM CHLORIDE 0.9 % IV SOLN: 500.0000 mL | INTRAVENOUS | Status: DC

## 2015-12-30 NOTE — Progress Notes (Signed)
Called to room to assist during endoscopic procedure.  Patient ID and intended procedure confirmed with present staff. Received instructions for my participation in the procedure from the performing physician.  

## 2015-12-30 NOTE — Patient Instructions (Signed)
YOU HAD AN ENDOSCOPIC PROCEDURE TODAY AT [facility]:   Refer to the procedure report that was given to you for any specific questions about what was found during the examination.  If the procedure report does not answer your questions, please call your gastroenterologist to clarify.  If you requested that your care partner not be given the details of your procedure findings, then the procedure report has been included in a sealed envelope for you to review at your convenience later.  YOU SHOULD EXPECT: Some feelings of bloating in the abdomen. Passage of more gas than usual.  Walking can help get rid of the air that was put into your GI tract during the procedure and reduce the bloating. If you had a lower endoscopy (such as a colonoscopy or flexible sigmoidoscopy) you may notice spotting of blood in your stool or on the toilet paper. If you underwent a bowel prep for your procedure, you may not have a normal bowel movement for a few days.  Please Note:  You might notice some irritation and congestion in your nose or some drainage.  This is from the oxygen used during your procedure.  There is no need for concern and it should clear up in a day or so.  SYMPTOMS TO REPORT IMMEDIATELY:   Following lower endoscopy (colonoscopy or flexible sigmoidoscopy):  Excessive amounts of blood in the stool  Significant tenderness or worsening of abdominal pains  Swelling of the abdomen that is new, acute  Fever of 100F or higher   For urgent or emergent issues, a gastroenterologist can be reached at any hour by calling [phone].   DIET: Your first meal following the procedure should be a small meal and then it is ok to progress to your normal diet. Heavy or fried foods are harder to digest and may make you feel nauseous or bloated.  Likewise, meals heavy in dairy and vegetables can increase bloating.  Drink plenty of fluids but you should avoid alcoholic beverages for 24 hours. Try to  increase the fiber in your diet due to your history of Diverticulitis.  ACTIVITY:  You should plan to take it easy for the rest of today and you should NOT DRIVE or use heavy machinery until tomorrow (because of the sedation medicines used during the test).    FOLLOW UP: Our staff will call the number listed on your records the next business day following your procedure to check on you and address any questions or concerns that you may have regarding the information given to you following your procedure. If we do not reach you, we will leave a message.  However, if you are feeling well and you are not experiencing any problems, there is no need to return our call.  We will assume that you have returned to your regular daily activities without incident.  If any biopsies were taken you will be contacted by phone or by letter within the next 1-3 weeks.  Please call us at [phone] if you have not heard about the biopsies in 3 weeks.    SIGNATURES/CONFIDENTIALITY: You and/or your care partner have signed paperwork which will be entered into your electronic medical record.  These signatures attest to the fact that that the information above on your After Visit Summary has been reviewed and is understood.  Full responsibility of the confidentiality of this discharge information lies with you and/or your care-partner.  Read all of the handouts given to you by your  recovery room nurse. Thank-you for choosing [place] for your healthcare needs.

## 2015-12-30 NOTE — Op Note (Signed)
[facility]  [address] [place], [id number]   COLONOSCOPY PROCEDURE REPORT  PATIENT: Emily, Horton  MR#: [id number] BIRTHDATE: [birth year] , 50  yrs. old GENDER: female ENDOSCOPIST: Eustace Quail, MD REFERRED BY:.  Self / Office PROCEDURE DATE:  12/30/2015 PROCEDURE:   Colonoscopy, diagnostic and Colonoscopy with snare polypectomy x 2 First Screening Colonoscopy - Avg.  risk and is 50 yrs.  old or older - No.  Prior Negative Screening - Now for repeat screening. N/A  History of Adenoma - Now for follow-up colonoscopy & has been > or = to 3 yrs.  Yes hx of adenoma.  Has been 3 or more years since last colonoscopy.  Polyps removed today? Yes ASA CLASS:   Class II INDICATIONS:an abnormal CT. Clinically with resolved bout of diverticulitis. Imaging question abnormal thickening of the sigmoid colon. Previous colonoscopies with Dr. Sharlett Iles 2000 and 2013 with tubular adenomas. MEDICATIONS: Monitored anesthesia care and Propofol 300 mg IV  DESCRIPTION OF PROCEDURE:   After the risks benefits and alternatives of the procedure were thoroughly explained, informed consent was obtained.  The digital rectal exam revealed no abnormalities of the rectum.   The LB [id number]  endoscope was introduced through the anus and advanced to the cecum, which was identified by both the appendix and ileocecal valve. No adverse events experienced.   The quality of the prep was excellent. (Suprep was used)  The instrument was then slowly withdrawn as the colon was fully examined. Estimated blood loss is zero unless otherwise noted in this procedure report.  COLON FINDINGS: Two polyps measuring 3 mm in size were found in the descending colon.  A polypectomy was performed with a cold snare. The resection was complete, the polyp tissue was completely retrieved and sent to histology.   There was moderate diverticulosis noted in the sigmoid colon.   The examination was otherwise  normal.  Retroflexed views revealed internal hemorrhoids. The time to cecum = 2.5 Withdrawal time = 14.1   The scope was withdrawn and the procedure completed. COMPLICATIONS: There were no immediate complications.  ENDOSCOPIC IMPRESSION: 1.   Two polyps were found in the descending colon; polypectomy was performed with a cold snare 2.   Moderate diverticulosis was noted in the sigmoid colon 3.   The examination was otherwise normal  RECOMMENDATIONS: 1. Follow up colonoscopy in 5 years (history of multiple adenomas)  eSigned:  Eustace Quail, MD 12/30/2015 8:40 AM   cc: The Patient and Laurey Morale, MD

## 2015-12-30 NOTE — Progress Notes (Signed)
Patient awakening,vss,report to rn 

## 2015-12-31 ENCOUNTER — Telehealth: Payer: Self-pay

## 2015-12-31 NOTE — Telephone Encounter (Signed)
  Follow up Call-  Call back number 12/30/2015  Post procedure Call Back phone  # [phone]  Permission to leave phone message Yes     Patient questions:  Do you have a fever, pain , or abdominal swelling? No. Pain Score  0 *  Have you tolerated food without any problems? Yes.    Have you been able to return to your normal activities? Yes.    Do you have any questions about your discharge instructions: Diet   No. Medications  No. Follow up visit  No.  Do you have questions or concerns about your Care? No.  Actions: * If pain score is 4 or above: No action needed, pain <4.

## 2016-01-06 ENCOUNTER — Encounter: Payer: Self-pay | Admitting: Internal Medicine

## 2016-04-19 ENCOUNTER — Other Ambulatory Visit: Payer: Self-pay

## 2016-04-19 DIAGNOSIS — Z1231 Encounter for screening mammogram for malignant neoplasm of breast: Secondary | ICD-10-CM

## 2016-05-13 ENCOUNTER — Other Ambulatory Visit: Payer: Self-pay | Admitting: Family Medicine

## 2016-05-13 ENCOUNTER — Ambulatory Visit
Admission: RE | Admit: 2016-05-13 | Discharge: 2016-05-13 | Disposition: A | Discharge status: Home / Self Care | Payer: Medicare Other | Source: Ambulatory Visit

## 2016-05-13 DIAGNOSIS — Z1231 Encounter for screening mammogram for malignant neoplasm of breast: Secondary | ICD-10-CM

## 2016-06-19 ENCOUNTER — Other Ambulatory Visit: Payer: Self-pay | Admitting: Family Medicine

## 2016-06-22 DIAGNOSIS — M7662 Achilles tendinitis, left leg: Secondary | ICD-10-CM | POA: Diagnosis not present

## 2016-06-22 DIAGNOSIS — M722 Plantar fascial fibromatosis: Secondary | ICD-10-CM | POA: Diagnosis not present

## 2016-07-06 ENCOUNTER — Encounter (HOSPITAL_COMMUNITY): Payer: Self-pay | Admitting: Emergency Medicine

## 2016-07-06 ENCOUNTER — Emergency Department (HOSPITAL_COMMUNITY): Payer: Medicare Other

## 2016-07-06 ENCOUNTER — Emergency Department (HOSPITAL_COMMUNITY)
Admission: EM | Admit: 2016-07-06 | Discharge: 2016-07-07 | Disposition: A | Discharge status: Home / Self Care | Payer: Medicare Other | Attending: Emergency Medicine | Admitting: Emergency Medicine

## 2016-07-06 DIAGNOSIS — E785 Hyperlipidemia, unspecified: Secondary | ICD-10-CM | POA: Diagnosis not present

## 2016-07-06 DIAGNOSIS — R079 Chest pain, unspecified: Secondary | ICD-10-CM

## 2016-07-06 DIAGNOSIS — R0789 Other chest pain: Secondary | ICD-10-CM | POA: Diagnosis not present

## 2016-07-06 DIAGNOSIS — Z79899 Other long term (current) drug therapy: Secondary | ICD-10-CM | POA: Diagnosis not present

## 2016-07-06 DIAGNOSIS — R5383 Other fatigue: Secondary | ICD-10-CM

## 2016-07-06 DIAGNOSIS — R0602 Shortness of breath: Secondary | ICD-10-CM | POA: Diagnosis not present

## 2016-07-06 DIAGNOSIS — R197 Diarrhea, unspecified: Secondary | ICD-10-CM | POA: Diagnosis not present

## 2016-07-06 DIAGNOSIS — R05 Cough: Secondary | ICD-10-CM | POA: Diagnosis not present

## 2016-07-06 DIAGNOSIS — F17210 Nicotine dependence, cigarettes, uncomplicated: Secondary | ICD-10-CM | POA: Insufficient documentation

## 2016-07-06 LAB — CBC
HCT: 43.5 % (ref 36.0–46.0)
Hemoglobin: 14.7 g/dL (ref 12.0–15.0)
MCH: 31.7 pg (ref 26.0–34.0)
MCHC: 33.8 g/dL (ref 30.0–36.0)
MCV: 93.8 fL (ref 78.0–100.0)
Platelets: 220 10*3/uL (ref 150–400)
RBC: 4.64 MIL/uL (ref 3.87–5.11)
RDW: 13.2 % (ref 11.5–15.5)
WBC: 7.9 10*3/uL (ref 4.0–10.5)

## 2016-07-06 LAB — I-STAT TROPONIN, ED
Troponin i, poc: 0 ng/mL (ref 0.00–0.08)
Troponin i, poc: 0 ng/mL (ref 0.00–0.08)

## 2016-07-06 LAB — BASIC METABOLIC PANEL
Anion gap: 8 (ref 5–15)
BUN: 16 mg/dL (ref 6–20)
CO2: 22 mmol/L (ref 22–32)
Calcium: 9.4 mg/dL (ref 8.9–10.3)
Chloride: 105 mmol/L (ref 101–111)
Creatinine, Ser: 1.31 mg/dL — ABNORMAL HIGH (ref 0.44–1.00)
GFR calc Af Amer: 48 mL/min — ABNORMAL LOW (ref >60)
GFR calc non Af Amer: 41 mL/min — ABNORMAL LOW (ref >60)
Glucose, Bld: 104 mg/dL — ABNORMAL HIGH (ref 65–99)
Potassium: 4.5 mmol/L (ref 3.5–5.1)
Sodium: 135 mmol/L (ref 135–145)

## 2016-07-06 LAB — D-DIMER, QUANTITATIVE: D-Dimer, Quant: 1.12 ug/mL-FEU — ABNORMAL HIGH (ref 0.00–0.50)

## 2016-07-06 MED ORDER — SODIUM CHLORIDE 0.9 % IV BOLUS (SEPSIS)
500.0000 mL | Freq: Once | INTRAVENOUS | Status: AC
Start: 2016-07-06 — End: 2016-07-06
  Administered 2016-07-06: 500 mL via INTRAVENOUS

## 2016-07-06 NOTE — ED Provider Notes (Signed)
[facility] DEPT Provider Note   CSN: [id number] Arrival date & time: 07/06/16  [id number]  First Provider Contact:  First MD Initiated Contact with Patient 07/06/16 1956        History   Chief Complaint Chief Complaint  Patient presents with  . Chest Pain  . Fatigue    HPI Emily Horton is a 70 y.o. female.  Patient presents with chest pain and fatigue. She states that 4 days ago she had episodes where she was sweating more than normal. She's been feeling a little bit fatigued since then. The next day she had one days worth of diarrhea. She's had no further episodes of diarrhea. She denies any nausea or vomiting. No abdominal pain. She states that today she started having some pain to her right chest. It's a sharp pain and it's worse with inspiration. She denies any cough or chest congestion. It's been constant since about 11:00 this afternoon. She has been feeling a little bit lightheaded. No leg pain or swelling. No known fevers. No recent travels. No history of DVTs. No past cardiac history.    Chest Pain  Associated symptoms: chest pain and diarrhea   Associated symptoms: no blood in stool, no headaches, no nausea, no shortness of breath, no vomiting and no weakness     Past Medical History:  Diagnosis Date  . Colon polyps 09-25-2007   Colonoscopy  . Depression   . Diverticulosis of colon (without mention of hemorrhage) 09-25-2007   Colonoscopy  . GERD (gastroesophageal reflux disease)   . Gynecological examination    sees Dr. Carren Rang   . Hyperlipidemia     Patient Active Problem List   Diagnosis Date Noted  . Diverticulosis of colon without hemorrhage 09/30/2015  . ACUTE BRONCHITIS 11/15/2007  . Hyperlipidemia 08/22/2007  . Depression 08/22/2007  . GERD 07/04/2007    Past Surgical History:  Procedure Laterality Date  . COLONOSCOPY  06-30-12   per Dr. Sharlett Iles, adenomatous  polyps, repeat in 5  yrs.   . ESOPHAGOGASTRODUODENOSCOPY  06-30-12   per Dr. Sharlett Iles,  reflux but no Barretts seen     OB History    No data available       Home Medications    Prior to Admission medications   Medication Sig Start Date End Date Taking? Authorizing Provider  atorvastatin (LIPITOR) 40 MG tablet TAKE 1 TABLET BY MOUTH DAILY 06/21/16  Yes Laurey Morale, MD  buPROPion (WELLBUTRIN XL) 300 MG 24 hr tablet TAKE 1 TABLET BY MOUTH EVERY MORNING 09/25/15  Yes Laurey Morale, MD  Multiple Vitamin (MULTIVITAMIN) capsule Take 1 capsule by mouth daily.     Yes Historical Provider, MD  omeprazole (PRILOSEC) 40 MG capsule TAKE 1 CAPSULE BY MOUTH TWICE DAILY 09/25/15  Yes Laurey Morale, MD  Thiamine HCl (VITAMIN B-1) 250 MG tablet Take 250 mg by mouth daily.   Yes Historical Provider, MD  buPROPion (WELLBUTRIN XL) 300 MG 24 hr tablet TAKE 1 TABLET BY MOUTH EVERY MORNING Patient not taking: Reported on 07/06/2016 06/21/16   Laurey Morale, MD  diazepam (VALIUM) 5 MG tablet Take 1 tablet (5 mg total) by mouth every 12 (twelve) hours as needed for anxiety. Patient not taking: Reported on 12/30/2015 10/14/15   Laurey Morale, MD  diclofenac (VOLTAREN) 75 MG EC tablet TAKE 1 TABLET BY MOUTH TWICE DAILY AS NEEDED FOR MODERATE PAIN 09/25/15   Laurey Morale, MD    Family History Family History  Problem Relation Age  of Onset  . Breast cancer Maternal Grandmother   . Crohn's disease Father   . Colon polyps Father   . Stroke Paternal Grandfather     Social History Social History  Substance Use Topics  . Smoking status: Current Every Day Smoker    Packs/day: 0.50    Years: 30.00    Types: Cigarettes  . Smokeless tobacco: Never Used     Comment: or less  . Alcohol use 0.0 oz/week     Comment: occasional     Allergies   Eggs or egg-derived products and Zoloft [sertraline hcl]   Review of Systems Review of Systems  Constitutional: Positive for diaphoresis and fatigue. Negative for chills and fever.  HENT: Negative for congestion, rhinorrhea and sneezing.   Eyes:  Negative.   Respiratory: Negative for cough, chest tightness and shortness of breath.   Cardiovascular: Positive for chest pain. Negative for leg swelling.  Gastrointestinal: Positive for diarrhea. Negative for abdominal pain, blood in stool, nausea and vomiting.  Genitourinary: Negative for difficulty urinating, flank pain, frequency and hematuria.  Musculoskeletal: Negative for arthralgias, back pain and neck pain.  Skin: Negative for rash and wound.  Neurological: Positive for dizziness and light-headedness. Negative for speech difficulty, weakness, numbness and headaches.     Physical Exam Updated Vital Signs BP 109/76   Pulse 93   Temp 98.9 F (37.2 C) (Oral)   Resp 12   Ht 5\' 5"  (1.651 m)   Wt 188 lb (85.3 kg)   SpO2 96%   BMI 31.28 kg/m   Physical Exam  Constitutional: She is oriented to person, place, and time. She appears well-developed and well-nourished.  HENT:  Head: Normocephalic and atraumatic.  Eyes: Pupils are equal, round, and reactive to light.  Neck: Normal range of motion. Neck supple.  Cardiovascular: Normal rate, regular rhythm and normal heart sounds.   Pulmonary/Chest: Effort normal and breath sounds normal. No respiratory distress. She has no wheezes. She has no rales. She exhibits tenderness (Tenderness across the right chest wall, no rashes).  Abdominal: Soft. Bowel sounds are normal. There is no tenderness. There is no rebound and no guarding.  Musculoskeletal: Normal range of motion. She exhibits no edema.  No edema or calf tenderness  Lymphadenopathy:    She has no cervical adenopathy.  Neurological: She is alert and oriented to person, place, and time.  Skin: Skin is warm and dry. No rash noted.  Psychiatric: She has a normal mood and affect.     ED Treatments / Results  Labs (all labs ordered are listed, but only abnormal results are displayed) Labs Reviewed  BASIC METABOLIC PANEL - Abnormal; Notable for the following:       Result Value    Glucose, Bld 104 (*)    Creatinine, Ser 1.31 (*)    GFR calc non Af Amer 41 (*)    GFR calc Af Amer 48 (*)    All other components within normal limits  D-DIMER, QUANTITATIVE (NOT AT [facility]) - Abnormal; Notable for the following:    D-Dimer, Quant 1.12 (*)    All other components within normal limits  CBC  I-STAT TROPOININ, ED  I-STAT TROPOININ, ED    EKG  EKG Interpretation  Date/Time:  Tuesday July 06 2016 18:27:17 EDT Ventricular Rate:  94 PR Interval:    QRS Duration: 83 QT Interval:  357 QTC Calculation: 447 R Axis:   -36 Text Interpretation:  Sinus rhythm Borderline short PR interval Left axis deviation Low voltage,  precordial leads RSR' in V1 or V2, probably normal variant Consider anterior infarct Baseline wander in lead(s) I II aVR aVF V1 No old tracing to compare Confirmed by Davieon Stockham  MD, Breiona Couvillon [phone]) on 07/06/2016 7:06:49 PM       Radiology Dg Chest 2 View  Result Date: 07/06/2016 CLINICAL DATA:  70 year old female with shortness of breath right side chest pain and cough for 3 days. Initial encounter. Smoker. EXAM: CHEST  2 VIEW COMPARISON:  08/16/2014 and earlier. FINDINGS: Stable lung volumes with increased AP dimension to the chest. Mediastinal contours remain normal. Visualized tracheal air column is within normal limits. Increased interstitial markings appear stable in both lungs. No pneumothorax, pulmonary edema, pleural effusion or acute pulmonary opacity. No acute osseous abnormality identified. IMPRESSION: Chronic hyperinflation.  No acute cardiopulmonary abnormality. Electronically Signed   By: Genevie Ann M.D.   On: 07/06/2016 19:02    Procedures Procedures (including critical care time)  Medications Ordered in ED Medications  sodium chloride 0.9 % bolus 500 mL (0 mLs Intravenous Stopped 07/06/16 2150)     Initial Impression / Assessment and Plan / ED Course  I have reviewed the triage vital signs and the nursing notes.  Pertinent labs & imaging results  that were available during my care of the patient were reviewed by me and considered in my medical decision making (see chart for details).  Clinical Course    Patient presents with right-sided chest pain. It's mildly reproducible on palpation. It's pleuritic.  She has no associated shortness of breath or hypoxia. Her d-dimer is elevated and she is awaiting a CT angiogram of the chest. She's had 2 negative troponins. It's very atypical for acute coronary syndrome. Her EKG doesn't show ischemic changes. If the CT is negative, patient can be discharged home to follow-up with her PCP. I discussed with her that she will need close follow-up for recheck. Return precautions were given. Dr. Nicholes Stairs to reassess patient after chest CT.  Final Clinical Impressions(s) / ED Diagnoses   Final diagnoses:  Chest pain, unspecified chest pain type    New Prescriptions New Prescriptions   No medications on file     Malvin Johns, MD 07/07/16 0004

## 2016-07-06 NOTE — ED Triage Notes (Signed)
States this weekend was sweating a lot.  then Sunday had diarrhea.  Patient c/o pain at right breast when taking deep breath that started today.  Patient felt faint this morning.

## 2016-07-07 ENCOUNTER — Emergency Department (HOSPITAL_COMMUNITY): Payer: Medicare Other

## 2016-07-07 ENCOUNTER — Encounter (HOSPITAL_COMMUNITY): Payer: Self-pay

## 2016-07-07 DIAGNOSIS — R079 Chest pain, unspecified: Secondary | ICD-10-CM | POA: Diagnosis not present

## 2016-07-07 MED ORDER — IOPAMIDOL (ISOVUE-370) INJECTION 76%
100.0000 mL | Freq: Once | INTRAVENOUS | Status: AC | PRN
  Administered 2016-07-07: 80 mL via INTRAVENOUS

## 2016-07-12 ENCOUNTER — Encounter: Payer: Self-pay | Admitting: Family Medicine

## 2016-07-12 ENCOUNTER — Ambulatory Visit (INDEPENDENT_AMBULATORY_CARE_PROVIDER_SITE_OTHER): Payer: Medicare Other | Admitting: Family Medicine

## 2016-07-12 VITALS — BP 99/70 | HR 92 | Temp 99.1°F | Ht 65.0 in | Wt 185.0 lb

## 2016-07-12 DIAGNOSIS — F329 Major depressive disorder, single episode, unspecified: Secondary | ICD-10-CM

## 2016-07-12 DIAGNOSIS — R079 Chest pain, unspecified: Secondary | ICD-10-CM

## 2016-07-12 DIAGNOSIS — F32A Depression, unspecified: Secondary | ICD-10-CM

## 2016-07-12 DIAGNOSIS — R8290 Unspecified abnormal findings in urine: Secondary | ICD-10-CM | POA: Diagnosis not present

## 2016-07-12 LAB — POC URINALSYSI DIPSTICK (AUTOMATED)
Bilirubin, UA: NEGATIVE
Glucose, UA: NEGATIVE
Ketones, UA: NEGATIVE
Leukocytes, UA: NEGATIVE
Nitrite, UA: NEGATIVE
Protein, UA: NEGATIVE
Spec Grav, UA: 1.01
Urobilinogen, UA: 1
pH, UA: 6.5

## 2016-07-12 MED ORDER — ESCITALOPRAM OXALATE 10 MG PO TABS: 10.0000 mg | ORAL_TABLET | Freq: Every day | ORAL | 2 refills | Status: DC

## 2016-07-12 MED ORDER — MELOXICAM 15 MG PO TABS: 15.0000 mg | ORAL_TABLET | Freq: Every day | ORAL | 2 refills | Status: DC

## 2016-07-12 NOTE — Progress Notes (Signed)
Pre visit review using our clinic review tool, if applicable. No additional management support is needed unless otherwise documented below in the visit note. 

## 2016-07-12 NOTE — Progress Notes (Signed)
   Subjective:    Patient ID: Emily Horton, female    DOB: [birth year], 70 y.o.   MRN: [id number]  HPI Here to follow up on ER visit on 07-06-16 for right sided chest pains. These were sharp in nature, they were worse when taking a deep breath, and she was tender in the right chest to palpation. No SOB or cough or fever or nausea. EKG and cardiac enzymes were normal. Labs were normal except for a slightly elevated d dimer. A CT angiogram of the chest was normal. She was sent home with no specific diagnosis. She has known GERD but takes Omeprazole daily. Since that day she has had no more of these sharp chest pains, but she acknowledges having frequent dull aching pains in the chest and back for several months. She asks if this could be stress related. She has been under a lot of stress wit caring for her husband who is in the final stages of Alzheimers disease. He is currently in a Hospice program. She worries about him constantly and often has crying spells. She is taking Wellbutrin daily and uses Valium prn.    Review of Systems  Constitutional: Negative.   HENT: Negative.   Eyes: Negative.   Respiratory: Negative.   Cardiovascular: Positive for chest pain. Negative for palpitations and leg swelling.  Gastrointestinal: Negative.   Neurological: Negative.   Psychiatric/Behavioral: Positive for dysphoric mood and sleep disturbance. Negative for confusion, decreased concentration, self-injury and suicidal ideas. The patient is nervous/anxious.        Objective:   Physical Exam  Constitutional: She is oriented to person, place, and time. She appears well-developed and well-nourished. No distress.  Neck: No thyromegaly present.  Cardiovascular: Normal rate, regular rhythm, normal heart sounds and intact distal pulses.   Pulmonary/Chest: Effort normal and breath sounds normal. No respiratory distress. She has no wheezes. She has no rales.  Mildly tender throughout the anterior chest     Musculoskeletal: She exhibits no edema.  Lymphadenopathy:    She has no cervical adenopathy.  Neurological: She is alert and oriented to person, place, and time.  Psychiatric: Her behavior is normal. Thought content normal.  Tearful           Assessment & Plan:  Her chest pain is likely musculoskeletal and I think she has costochondritis. Treat with Meloxicam 15 mg daily. For her anxiety and depression we will add Lexapro 10 mg daily to her current regimen. Recheck in 3-4 weeks.  Laurey Morale, MD

## 2016-07-12 NOTE — Addendum Note (Signed)
Addended by: Gari Crown D on: 07/12/2016 02:39 PM   Modules accepted: Orders

## 2016-07-14 DIAGNOSIS — M722 Plantar fascial fibromatosis: Secondary | ICD-10-CM | POA: Diagnosis not present

## 2016-07-30 ENCOUNTER — Other Ambulatory Visit: Payer: Self-pay

## 2016-09-17 ENCOUNTER — Other Ambulatory Visit: Payer: Self-pay | Admitting: Family Medicine

## 2016-10-08 ENCOUNTER — Other Ambulatory Visit: Payer: Self-pay | Admitting: Family Medicine

## 2016-10-19 DIAGNOSIS — L508 Other urticaria: Secondary | ICD-10-CM | POA: Diagnosis not present

## 2016-10-22 DIAGNOSIS — L723 Sebaceous cyst: Secondary | ICD-10-CM | POA: Diagnosis not present

## 2016-10-22 DIAGNOSIS — Z23 Encounter for immunization: Secondary | ICD-10-CM | POA: Diagnosis not present

## 2016-10-25 DIAGNOSIS — L500 Allergic urticaria: Secondary | ICD-10-CM | POA: Diagnosis not present

## 2016-10-25 DIAGNOSIS — L508 Other urticaria: Secondary | ICD-10-CM | POA: Diagnosis not present

## 2016-11-02 ENCOUNTER — Inpatient Hospital Stay (HOSPITAL_COMMUNITY)
Admission: EM | Admit: 2016-11-02 | Discharge: 2016-11-05 | DRG: 916 | Disposition: A | Discharge status: Home / Self Care | Payer: Medicare Other | Attending: Internal Medicine | Admitting: Internal Medicine

## 2016-11-02 ENCOUNTER — Encounter (HOSPITAL_COMMUNITY): Payer: Self-pay | Admitting: *Deleted

## 2016-11-02 ENCOUNTER — Encounter: Payer: Self-pay | Admitting: Family Medicine

## 2016-11-02 DIAGNOSIS — F329 Major depressive disorder, single episode, unspecified: Secondary | ICD-10-CM | POA: Diagnosis not present

## 2016-11-02 DIAGNOSIS — Z79899 Other long term (current) drug therapy: Secondary | ICD-10-CM

## 2016-11-02 DIAGNOSIS — X58XXXA Exposure to other specified factors, initial encounter: Secondary | ICD-10-CM | POA: Diagnosis present

## 2016-11-02 DIAGNOSIS — K219 Gastro-esophageal reflux disease without esophagitis: Secondary | ICD-10-CM | POA: Diagnosis present

## 2016-11-02 DIAGNOSIS — F17210 Nicotine dependence, cigarettes, uncomplicated: Secondary | ICD-10-CM | POA: Diagnosis present

## 2016-11-02 DIAGNOSIS — R030 Elevated blood-pressure reading, without diagnosis of hypertension: Secondary | ICD-10-CM | POA: Diagnosis not present

## 2016-11-02 DIAGNOSIS — E785 Hyperlipidemia, unspecified: Secondary | ICD-10-CM | POA: Diagnosis present

## 2016-11-02 DIAGNOSIS — Z91012 Allergy to eggs: Secondary | ICD-10-CM

## 2016-11-02 DIAGNOSIS — K5730 Diverticulosis of large intestine without perforation or abscess without bleeding: Secondary | ICD-10-CM | POA: Diagnosis not present

## 2016-11-02 DIAGNOSIS — Z7982 Long term (current) use of aspirin: Secondary | ICD-10-CM

## 2016-11-02 DIAGNOSIS — T783XXA Angioneurotic edema, initial encounter: Secondary | ICD-10-CM | POA: Diagnosis not present

## 2016-11-02 DIAGNOSIS — F339 Major depressive disorder, recurrent, unspecified: Secondary | ICD-10-CM | POA: Diagnosis present

## 2016-11-02 DIAGNOSIS — Z888 Allergy status to other drugs, medicaments and biological substances status: Secondary | ICD-10-CM

## 2016-11-02 MED ORDER — FAMOTIDINE IN NACL 20-0.9 MG/50ML-% IV SOLN
20.0000 mg | INTRAVENOUS | Status: AC
  Administered 2016-11-02: 20 mg via INTRAVENOUS
  Filled 2016-11-02: qty 50

## 2016-11-02 MED ORDER — DIPHENHYDRAMINE HCL 50 MG/ML IJ SOLN
50.0000 mg | Freq: Once | INTRAMUSCULAR | Status: AC
  Administered 2016-11-02: 50 mg via INTRAVENOUS
  Filled 2016-11-02: qty 1

## 2016-11-02 MED ORDER — SODIUM CHLORIDE 0.9 % IV BOLUS (SEPSIS)
1000.0000 mL | Freq: Once | INTRAVENOUS | Status: AC
  Administered 2016-11-02: 1000 mL via INTRAVENOUS

## 2016-11-02 MED ORDER — METHYLPREDNISOLONE SODIUM SUCC 125 MG IJ SOLR
125.0000 mg | Freq: Once | INTRAMUSCULAR | Status: AC
  Administered 2016-11-02: 125 mg via INTRAVENOUS
  Filled 2016-11-02: qty 2

## 2016-11-02 MED ORDER — EPINEPHRINE 0.3 MG/0.3ML IJ SOAJ
INTRAMUSCULAR | Status: AC
Start: 2016-11-02 — End: 2016-11-02
  Administered 2016-11-02: 22:00:00
  Filled 2016-11-02: qty 0.3

## 2016-11-02 NOTE — ED Provider Notes (Signed)
Medical screening examination/treatment/procedure(s) were conducted as a shared visit with non-physician practitioner(s) and myself.  I personally evaluated the patient during the encounter.   EKG Interpretation None       Patient seen by me along with the physician assistant. Patient last evening started with some tingling in the little bit of itching to the right side of her face around the lip area. Woke up with swelling on that side. Patient's had several episodes of lip swelling tongue swelling face swelling hives on the trunk and sometimes on her arms over the last month. Has not seen an allergist she had. Just finished a course of prednisone yesterday. Patient here tonight treated with Benadryl, Pepcid, and IV steroid. Patient showing some signs of improvement. Patient now talking better. He still has swelling to the right side of her face lip and some swelling to the tongue. Patient will require close observation here. In addition patient was given EpiPen. Patient will require close up station here. If she gets worse at all will require admission may require airway but right now looks like she's going to be fine. With the other episodes patient has never required admission.  Patient with this episode also had some itching to both arms. No hives currently. Lungs are clear bilaterally no wheezing. Patient's alert and oriented.   Fredia Sorrow, MD 11/02/16 [phone]

## 2016-11-02 NOTE — ED Provider Notes (Signed)
[facility] DEPT Provider Note   CSN: [id number] Arrival date & time: 11/02/16  2118  By signing my name below, I, [name], attest that this documentation has been prepared under the direction and in the presence of non-physician practitioner, Antonietta Breach, PA-C. Electronically Signed: [name], Scribe. 11/02/2016. 9:58 PM.   History   Chief Complaint Chief Complaint  Patient presents with  . Allergic Reaction    The history is provided by the patient and a friend. No language interpreter was used.  Allergic Reaction  Presenting symptoms: rash (Hives) and swelling   Presenting symptoms: no difficulty swallowing   Severity:  Moderate Duration:  4 hours Prior allergic episodes:  No prior episodes Context: not new detergents/soaps   Relieved by:  Nothing Worsened by:  Nothing Ineffective treatments:  Antihistamines  HPI Comments: Emily Horton is a 70 y.o. female who presents to the Emergency Department complaining of an allergic reaction that started last nigth ago. She states she has been having facial swelling, throat swelling, tongue swelling, and hives that started in her hands. She reports taking an OTC allergy medication PTA without relief. She reports she has been tapering prednisone with yesterday being the last dose. She admits to feeling stressed lately. She denies any prior hx of similar complaint at his severity, new use of hygeine products, headache, fever, inability to swallow, drooling, or other complaints.   PCP: Laurey Morale, MD   Past Medical History:  Diagnosis Date  . Colon polyps 09-25-2007   Colonoscopy  . Depression   . Diverticulosis of colon (without mention of hemorrhage) 09-25-2007   Colonoscopy  . GERD (gastroesophageal reflux disease)   . Gynecological examination    sees Dr. Carren Rang   . Hyperlipidemia     Patient Active Problem List   Diagnosis Date Noted  . Angioedema 11/03/2016  . Diverticulosis of colon without hemorrhage  09/30/2015  . ACUTE BRONCHITIS 11/15/2007  . Hyperlipidemia 08/22/2007  . Depression 08/22/2007  . GERD 07/04/2007    Past Surgical History:  Procedure Laterality Date  . COLONOSCOPY  06-30-12   per Dr. Sharlett Iles, adenomatous  polyps, repeat in 5  yrs.   . ESOPHAGOGASTRODUODENOSCOPY  06-30-12   per Dr. Sharlett Iles, reflux but no Barretts seen     OB History    No data available       Home Medications    Prior to Admission medications   Medication Sig Start Date End Date Taking? Authorizing Provider  aspirin EC 81 MG tablet Take 81 mg by mouth daily.   Yes Historical Provider, MD  aspirin-acetaminophen-caffeine (EXCEDRIN MIGRAINE) [phone] MG tablet Take 1 tablet by mouth every 6 (six) hours as needed for headache.   Yes Historical Provider, MD  atorvastatin (LIPITOR) 40 MG tablet TAKE 1 TABLET BY MOUTH DAILY 06/21/16  Yes Laurey Morale, MD  buPROPion (WELLBUTRIN XL) 300 MG 24 hr tablet TAKE 1 TABLET BY MOUTH EVERY MORNING 09/25/15  Yes Laurey Morale, MD  LORazepam (ATIVAN) 0.5 MG tablet Take 0.5 mg by mouth as needed for anxiety.   Yes Historical Provider, MD  omeprazole (PRILOSEC) 40 MG capsule TAKE 1 CAPSULE BY MOUTH TWICE DAILY 09/17/16  Yes Laurey Morale, MD  diazepam (VALIUM) 5 MG tablet Take 1 tablet (5 mg total) by mouth every 12 (twelve) hours as needed for anxiety. Patient not taking: Reported on 11/02/2016 10/14/15   Laurey Morale, MD  escitalopram (LEXAPRO) 10 MG tablet TAKE 1 TABLET(10 MG) BY MOUTH DAILY Patient not  taking: Reported on 11/02/2016 10/08/16   Laurey Morale, MD  meloxicam ([place]) 15 MG tablet TAKE 1 TABLET(15 MG) BY MOUTH DAILY Patient not taking: Reported on 11/02/2016 10/08/16   Laurey Morale, MD  omeprazole (PRILOSEC) 40 MG capsule TAKE 1 CAPSULE BY MOUTH TWICE DAILY Patient not taking: Reported on 11/02/2016 09/25/15   Laurey Morale, MD    Family History Family History  Problem Relation Age of Onset  . Breast cancer Maternal Grandmother   . Crohn's  disease Father   . Colon polyps Father   . Stroke Paternal Grandfather     Social History Social History  Substance Use Topics  . Smoking status: Current Every Day Smoker    Packs/day: 0.50    Years: 30.00    Types: Cigarettes  . Smokeless tobacco: Never Used     Comment: or less  . Alcohol use 0.0 oz/week     Comment: occasional     Allergies   Eggs or egg-derived products and Zoloft [sertraline hcl]   Review of Systems Review of Systems  HENT: Positive for facial swelling. Negative for drooling and trouble swallowing.   Skin: Positive for rash (Hives).  Neurological: Negative for headaches.  All other systems reviewed and are negative.    Physical Exam Updated Vital Signs BP (!) 164/116 (BP Location: Left Arm)   Pulse 92   Temp 98.5 F (36.9 C) (Oral)   Resp 18   Ht 5\' 5"  (1.651 m)   Wt 180 lb (81.6 kg)   SpO2 95%   BMI 29.95 kg/m   Physical Exam  Constitutional: She is oriented to person, place, and time. She appears well-developed and well-nourished. No distress.  Nontoxic and in NAD  HENT:  Head: Normocephalic and atraumatic.  Angioedema of the tongue, posterior oropharynx, and right lips and buccal mucosa. Patient tolerating secretions. Uvula midline.  Eyes: Conjunctivae and EOM are normal. No scleral icterus.  Neck: Normal range of motion.  Cardiovascular: Normal rate, regular rhythm and intact distal pulses.   Pulmonary/Chest: Effort normal. No stridor. No respiratory distress. She has no wheezes. She has no rales.  No stridor. Lungs CTAB.  Musculoskeletal: Normal range of motion.  Neurological: She is alert and oriented to person, place, and time. She exhibits normal muscle tone. Coordination normal.  GCS 15. Patient moving all extremities.  Skin: Skin is warm and dry. Rash noted. She is not diaphoretic. No erythema. No pallor.  Urticaria behind the left ear as well as to palms of hands.  Psychiatric: She has a normal mood and affect. Her behavior  is normal.  Nursing note and vitals reviewed.    ED Treatments / Results  DIAGNOSTIC STUDIES: Oxygen Saturation is 95% on RA, normal by my interpretation.    COORDINATION OF CARE: 10:03 PM- Pt advised of plan for treatment and pt agrees.  Labs (all labs ordered are listed, but only abnormal results are displayed) Labs Reviewed - No data to display  EKG  EKG Interpretation None       Radiology No results found.  Procedures Procedures (including critical care time)  Medications Ordered in ED Medications  sodium chloride 0.9 % bolus 1,000 mL (0 mLs Intravenous Stopped 11/03/16 0007)  diphenhydrAMINE (BENADRYL) injection 50 mg (50 mg Intravenous Given 11/02/16 2209)  methylPREDNISolone sodium succinate (SOLU-MEDROL) 125 mg/2 mL injection 125 mg (125 mg Intravenous Given 11/02/16 2210)  famotidine (PEPCID) IVPB 20 mg premix (0 mg Intravenous Stopped 11/03/16 0007)  EPINEPHrine (EPI-PEN) 0.3 mg/0.3 mL  injection (  Given 11/02/16 2210)  diphenhydrAMINE (BENADRYL) injection 25 mg (25 mg Intravenous Given 11/03/16 0237)  0.9 %  sodium chloride infusion ( Intravenous New Bag/Given 11/03/16 0238)    CRITICAL CARE Performed by: Antonietta Breach   Total critical care time: 45 minutes  Critical care time was exclusive of separately billable procedures and treating other patients.  Critical care was necessary to treat or prevent imminent or life-threatening deterioration.  Critical care was time spent personally by me on the following activities: development of treatment plan with patient and/or surrogate as well as nursing, discussions with consultants, evaluation of patient's response to treatment, examination of patient, obtaining history from patient or surrogate, ordering and performing treatments and interventions, ordering and review of laboratory studies, ordering and review of radiographic studies, pulse oximetry and re-evaluation of patient's condition.   Initial  Impression / Assessment and Plan / ED Course  I have reviewed the triage vital signs and the nursing notes.  Pertinent labs & imaging results that were available during my care of the patient were reviewed by me and considered in my medical decision making (see chart for details).  Clinical Course     70 year old female presents to the emergency department for evaluation of angioedema. She is not on an ACE inhibitor. She has had intermittent symptoms over the past few weeks. She reports recently finishing a 2 week prednisone taper. Patient with obvious angioedema to her posterior oropharynx, tongue, and right lips as well as buccal mucosa. This has not improved despite Benadryl, Pepcid, Solumedrol, and an Epipen. Given oropharyngeal involvement, concern for airway compromise with premature discharge. Will admit for further observation. Temporary holding orders placed for observation Stepdown admission. VSS.   Vitals:   11/03/16 0130 11/03/16 0200 11/03/16 0230 11/03/16 0300  BP: 124/100 139/81 127/83 140/86  Pulse: 96 99 90 96  Resp: 20 14 19 22   Temp:      TempSrc:      SpO2: 96% 97% 93% 93%  Weight:      Height:        Final Clinical Impressions(s) / ED Diagnoses   Final diagnoses:  Angioedema, initial encounter    New Prescriptions New Prescriptions   No medications on file   I personally performed the services described in this documentation, which was scribed in my presence. The recorded information has been reviewed and is accurate.       Antonietta Breach, PA-C 11/03/16 0326    Antonietta Breach, PA-C 11/03/16 [id number]    Fredia Sorrow, MD 11/04/16 [phone]

## 2016-11-02 NOTE — ED Triage Notes (Signed)
Pt complains of facial swelling since last night. Pt states it feels like her throat is closing up and her tongue is swelling. Pt states she has had itching and swelling to different areas of her body for the past 2 weeks. Pt was put on prednisone, which she finished yesterday. Pt has followed up with dermatologist but is not sure what she is allergic to.

## 2016-11-03 ENCOUNTER — Encounter (HOSPITAL_COMMUNITY): Payer: Self-pay | Admitting: Internal Medicine

## 2016-11-03 DIAGNOSIS — K219 Gastro-esophageal reflux disease without esophagitis: Secondary | ICD-10-CM | POA: Diagnosis not present

## 2016-11-03 DIAGNOSIS — T783XXA Angioneurotic edema, initial encounter: Secondary | ICD-10-CM | POA: Diagnosis present

## 2016-11-03 DIAGNOSIS — F329 Major depressive disorder, single episode, unspecified: Secondary | ICD-10-CM | POA: Diagnosis not present

## 2016-11-03 DIAGNOSIS — Z888 Allergy status to other drugs, medicaments and biological substances status: Secondary | ICD-10-CM | POA: Diagnosis not present

## 2016-11-03 DIAGNOSIS — Z79899 Other long term (current) drug therapy: Secondary | ICD-10-CM | POA: Diagnosis not present

## 2016-11-03 DIAGNOSIS — E785 Hyperlipidemia, unspecified: Secondary | ICD-10-CM | POA: Diagnosis present

## 2016-11-03 DIAGNOSIS — X58XXXA Exposure to other specified factors, initial encounter: Secondary | ICD-10-CM | POA: Diagnosis present

## 2016-11-03 DIAGNOSIS — T783XXD Angioneurotic edema, subsequent encounter: Secondary | ICD-10-CM | POA: Diagnosis not present

## 2016-11-03 DIAGNOSIS — Z91012 Allergy to eggs: Secondary | ICD-10-CM | POA: Diagnosis not present

## 2016-11-03 DIAGNOSIS — R030 Elevated blood-pressure reading, without diagnosis of hypertension: Secondary | ICD-10-CM | POA: Diagnosis present

## 2016-11-03 DIAGNOSIS — K5730 Diverticulosis of large intestine without perforation or abscess without bleeding: Secondary | ICD-10-CM | POA: Diagnosis present

## 2016-11-03 DIAGNOSIS — Z7982 Long term (current) use of aspirin: Secondary | ICD-10-CM | POA: Diagnosis not present

## 2016-11-03 DIAGNOSIS — F17210 Nicotine dependence, cigarettes, uncomplicated: Secondary | ICD-10-CM | POA: Diagnosis present

## 2016-11-03 LAB — BASIC METABOLIC PANEL
Anion gap: 8 (ref 5–15)
BUN: 18 mg/dL (ref 6–20)
CO2: 21 mmol/L — ABNORMAL LOW (ref 22–32)
Calcium: 9.2 mg/dL (ref 8.9–10.3)
Chloride: 107 mmol/L (ref 101–111)
Creatinine, Ser: 0.92 mg/dL (ref 0.44–1.00)
GFR calc Af Amer: >60 mL/min (ref >60)
GFR calc non Af Amer: >60 mL/min (ref >60)
Glucose, Bld: 166 mg/dL — ABNORMAL HIGH (ref 65–99)
Potassium: 4.3 mmol/L (ref 3.5–5.1)
Sodium: 136 mmol/L (ref 135–145)

## 2016-11-03 LAB — HEPATIC FUNCTION PANEL
ALT: 10 U/L — ABNORMAL LOW (ref 14–54)
AST: 12 U/L — ABNORMAL LOW (ref 15–41)
Albumin: 4.1 g/dL (ref 3.5–5.0)
Alkaline Phosphatase: 69 U/L (ref 38–126)
Bilirubin, Direct: 0.1 mg/dL (ref 0.1–0.5)
Indirect Bilirubin: 0.9 mg/dL (ref 0.3–0.9)
Total Bilirubin: 1 mg/dL (ref 0.3–1.2)
Total Protein: 6.4 g/dL — ABNORMAL LOW (ref 6.5–8.1)

## 2016-11-03 LAB — GLUCOSE, CAPILLARY
Glucose-Capillary: 140 mg/dL — ABNORMAL HIGH (ref 65–99)
Glucose-Capillary: 158 mg/dL — ABNORMAL HIGH (ref 65–99)
Glucose-Capillary: 221 mg/dL — ABNORMAL HIGH (ref 65–99)

## 2016-11-03 LAB — C-REACTIVE PROTEIN: CRP: 1.3 mg/dL — ABNORMAL HIGH (ref <1.0)

## 2016-11-03 LAB — SEDIMENTATION RATE: Sed Rate: 14 mm/hr (ref 0–22)

## 2016-11-03 LAB — DIFFERENTIAL
Basophils Absolute: 0 10*3/uL (ref 0.0–0.1)
Basophils Relative: 0 %
Eosinophils Absolute: 0 10*3/uL (ref 0.0–0.7)
Eosinophils Relative: 0 %
Lymphocytes Relative: 4 %
Lymphs Abs: 0.6 10*3/uL — ABNORMAL LOW (ref 0.7–4.0)
Monocytes Absolute: 0.2 10*3/uL (ref 0.1–1.0)
Monocytes Relative: 1 %
Neutro Abs: 13.4 10*3/uL — ABNORMAL HIGH (ref 1.7–7.7)
Neutrophils Relative %: 95 %

## 2016-11-03 LAB — CBC
HCT: 41.4 % (ref 36.0–46.0)
Hemoglobin: 13.5 g/dL (ref 12.0–15.0)
MCH: 31.2 pg (ref 26.0–34.0)
MCHC: 32.6 g/dL (ref 30.0–36.0)
MCV: 95.6 fL (ref 78.0–100.0)
Platelets: 280 10*3/uL (ref 150–400)
RBC: 4.33 MIL/uL (ref 3.87–5.11)
RDW: 13.7 % (ref 11.5–15.5)
WBC: 14.4 10*3/uL — ABNORMAL HIGH (ref 4.0–10.5)

## 2016-11-03 LAB — MRSA PCR SCREENING: MRSA by PCR: NEGATIVE

## 2016-11-03 MED ORDER — DIPHENHYDRAMINE HCL 50 MG/ML IJ SOLN
25.0000 mg | Freq: Three times a day (TID) | INTRAMUSCULAR | Status: DC
  Administered 2016-11-03 – 2016-11-04 (×4): 25 mg via INTRAVENOUS
  Filled 2016-11-03 (×4): qty 1

## 2016-11-03 MED ORDER — FAMOTIDINE IN NACL 20-0.9 MG/50ML-% IV SOLN: 20.0000 mg | Freq: Two times a day (BID) | INTRAVENOUS | Status: DC

## 2016-11-03 MED ORDER — ACETAMINOPHEN 650 MG RE SUPP: 650.0000 mg | Freq: Four times a day (QID) | RECTAL | Status: DC | PRN

## 2016-11-03 MED ORDER — LORAZEPAM 0.5 MG PO TABS
0.5000 mg | ORAL_TABLET | ORAL | Status: DC | PRN
  Administered 2016-11-03: 0.5 mg via ORAL
  Filled 2016-11-03: qty 1

## 2016-11-03 MED ORDER — METHYLPREDNISOLONE SODIUM SUCC 125 MG IJ SOLR
60.0000 mg | Freq: Three times a day (TID) | INTRAMUSCULAR | Status: DC
  Administered 2016-11-03 – 2016-11-04 (×4): 60 mg via INTRAVENOUS
  Filled 2016-11-03 (×4): qty 2

## 2016-11-03 MED ORDER — SODIUM CHLORIDE 0.9 % IV SOLN
INTRAVENOUS | Status: DC
  Administered 2016-11-03 (×2): via INTRAVENOUS

## 2016-11-03 MED ORDER — DIPHENHYDRAMINE HCL 50 MG/ML IJ SOLN
25.0000 mg | Freq: Once | INTRAMUSCULAR | Status: AC
  Administered 2016-11-03: 25 mg via INTRAVENOUS
  Filled 2016-11-03: qty 1

## 2016-11-03 MED ORDER — SODIUM CHLORIDE 0.9 % IV SOLN
25.0000 mg | INTRAVENOUS | Status: DC | PRN
  Filled 2016-11-03: qty 0.5

## 2016-11-03 MED ORDER — SODIUM CHLORIDE 0.9 % IV SOLN
Freq: Once | INTRAVENOUS | Status: AC
  Administered 2016-11-03: 03:00:00 via INTRAVENOUS

## 2016-11-03 MED ORDER — NICOTINE 14 MG/24HR TD PT24
14.0000 mg | MEDICATED_PATCH | Freq: Every day | TRANSDERMAL | Status: DC
  Administered 2016-11-03 – 2016-11-05 (×3): 14 mg via TRANSDERMAL
  Filled 2016-11-03 (×3): qty 1

## 2016-11-03 MED ORDER — ASPIRIN-ACETAMINOPHEN-CAFFEINE 250-250-65 MG PO TABS
1.0000 | ORAL_TABLET | Freq: Four times a day (QID) | ORAL | Status: DC | PRN
  Filled 2016-11-03: qty 1

## 2016-11-03 MED ORDER — ACETAMINOPHEN 325 MG PO TABS: 650.0000 mg | ORAL_TABLET | Freq: Four times a day (QID) | ORAL | Status: DC | PRN

## 2016-11-03 MED ORDER — FAMOTIDINE IN NACL 20-0.9 MG/50ML-% IV SOLN
20.0000 mg | Freq: Three times a day (TID) | INTRAVENOUS | Status: DC
  Administered 2016-11-03 – 2016-11-04 (×4): 20 mg via INTRAVENOUS
  Filled 2016-11-03 (×4): qty 50

## 2016-11-03 MED ORDER — METHYLPREDNISOLONE SODIUM SUCC 40 MG IJ SOLR: 40.0000 mg | Freq: Two times a day (BID) | INTRAMUSCULAR | Status: DC

## 2016-11-03 NOTE — H&P (Signed)
History and Physical    Emily Horton [id number] DOB: 20-Oct-1946 DOA: 11/02/2016  PCP: Laurey Morale, MD  Patient coming from: Home.  Chief Complaint: Tongue swelling and difficulty breathing.  HPI: Emily Horton is a 70 y.o. female with history of GERD and depression who has had previous history of recurrent allergic reaction presents to the ER because of difficulty breathing with tongue swelling. Patient started experiencing skin rash and lip swelling 2 weeks ago and was prescribed prednisone taper dose by patient's primary care physician. Patient had finished the prednisone course 2 days ago. Last night patient started developing tongue swelling and difficulty breathing and presented to the ER. On exam patient had diffuse tongue swelling but had no stridor and was able to talk. Patient denies having taken any new medications and has had no  insect bites or used any new detergents or perfumes.  ED Course: Patient was given epinephrine followed by Solu-Medrol Benadryl and Pepcid.  Review of Systems: As per HPI, rest all negative.   Past Medical History:  Diagnosis Date  . Colon polyps 09-25-2007   Colonoscopy  . Depression   . Diverticulosis of colon (without mention of hemorrhage) 09-25-2007   Colonoscopy  . GERD (gastroesophageal reflux disease)   . Gynecological examination    sees Dr. Carren Rang   . Hyperlipidemia     Past Surgical History:  Procedure Laterality Date  . COLONOSCOPY  06-30-12   per Dr. Sharlett Iles, adenomatous  polyps, repeat in 5  yrs.   . ESOPHAGOGASTRODUODENOSCOPY  06-30-12   per Dr. Sharlett Iles, reflux but no Barretts seen      reports that she has been smoking Cigarettes.  She has a 15.00 pack-year smoking history. She has never used smokeless tobacco. She reports that she drinks alcohol. She reports that she does not use drugs.  Allergies  Allergen Reactions  . Eggs Or Egg-Derived Products     Hives   . Zoloft [Sertraline Hcl]     Headaches     Family  History  Problem Relation Age of Onset  . Breast cancer Maternal Grandmother   . Crohn's disease Father   . Colon polyps Father   . Stroke Paternal Grandfather     Prior to Admission medications   Medication Sig Start Date End Date Taking? Authorizing Provider  aspirin EC 81 MG tablet Take 81 mg by mouth daily.   Yes Historical Provider, MD  aspirin-acetaminophen-caffeine (EXCEDRIN MIGRAINE) [phone] MG tablet Take 1 tablet by mouth every 6 (six) hours as needed for headache.   Yes Historical Provider, MD  atorvastatin (LIPITOR) 40 MG tablet TAKE 1 TABLET BY MOUTH DAILY 06/21/16  Yes Laurey Morale, MD  buPROPion (WELLBUTRIN XL) 300 MG 24 hr tablet TAKE 1 TABLET BY MOUTH EVERY MORNING 09/25/15  Yes Laurey Morale, MD  LORazepam (ATIVAN) 0.5 MG tablet Take 0.5 mg by mouth as needed for anxiety.   Yes Historical Provider, MD  omeprazole (PRILOSEC) 40 MG capsule TAKE 1 CAPSULE BY MOUTH TWICE DAILY 09/17/16  Yes Laurey Morale, MD  diazepam (VALIUM) 5 MG tablet Take 1 tablet (5 mg total) by mouth every 12 (twelve) hours as needed for anxiety. Patient not taking: Reported on 11/02/2016 10/14/15   Laurey Morale, MD  escitalopram (LEXAPRO) 10 MG tablet TAKE 1 TABLET(10 MG) BY MOUTH DAILY Patient not taking: Reported on 11/02/2016 10/08/16   Laurey Morale, MD  meloxicam (MOBIC) 15 MG tablet TAKE 1 TABLET(15 MG) BY MOUTH DAILY Patient not  taking: Reported on 11/02/2016 10/08/16   Laurey Morale, MD  omeprazole (PRILOSEC) 40 MG capsule TAKE 1 CAPSULE BY MOUTH TWICE DAILY Patient not taking: Reported on 11/02/2016 09/25/15   Laurey Morale, MD    Physical Exam: Vitals:   11/03/16 0500 11/03/16 0500 11/03/16 0501 11/03/16 0530  BP: 143/87 143/87  155/91  Pulse: 94 94  93  Resp: 21 15  16   Temp:  98.5 F (36.9 C)    TempSrc:  Oral    SpO2: 97% 97% 98% 95%  Weight:      Height:          Constitutional: Moderately built and nourished. Vitals:   11/03/16 0500 11/03/16 0500 11/03/16 0501 11/03/16  0530  BP: 143/87 143/87  155/91  Pulse: 94 94  93  Resp: 21 15  16   Temp:  98.5 F (36.9 C)    TempSrc:  Oral    SpO2: 97% 97% 98% 95%  Weight:      Height:       Eyes: Anicteric no pallor. ENMT: Tongue diffusely swollen with mild swelling of the uvula. Neck: No stridor. No nuchal rigidity. Respiratory: No rhonchi or crepitations. Cardiovascular: S1 and S2 heard. No murmurs appreciated. Abdomen: Soft nontender bowel sounds present. No guarding or rigidity. Musculoskeletal: No edema. No joint effusion. Skin: Skin rash on the back of both ears. Neurologic: Alert awake oriented to time place and person. Moves all extremities. Psychiatric: Appears normal. Normal affect.   Labs on Admission: I have personally reviewed following labs and imaging studies  CBC:  Recent Labs Lab 11/03/16 0444  WBC 14.4*  HGB 13.5  HCT 41.4  MCV 95.6  PLT 123456   Basic Metabolic Panel:  Recent Labs Lab 11/03/16 0444  NA 136  K 4.3  CL 107  CO2 21*  GLUCOSE 166*  BUN 18  CREATININE 0.92  CALCIUM 9.2   GFR: Estimated Creatinine Clearance: 61.7 mL/min (by C-G formula based on SCr of 0.92 mg/dL). Liver Function Tests: No results for input(s): AST, ALT, ALKPHOS, BILITOT, PROT, ALBUMIN in the last 168 hours. No results for input(s): LIPASE, AMYLASE in the last 168 hours. No results for input(s): AMMONIA in the last 168 hours. Coagulation Profile: No results for input(s): INR, PROTIME in the last 168 hours. Cardiac Enzymes: No results for input(s): CKTOTAL, CKMB, CKMBINDEX, TROPONINI in the last 168 hours. BNP (last 3 results) No results for input(s): PROBNP in the last 8760 hours. HbA1C: No results for input(s): HGBA1C in the last 72 hours. CBG: No results for input(s): GLUCAP in the last 168 hours. Lipid Profile: No results for input(s): CHOL, HDL, LDLCALC, TRIG, CHOLHDL, LDLDIRECT in the last 72 hours. Thyroid Function Tests: No results for input(s): TSH, T4TOTAL, FREET4, T3FREE,  THYROIDAB in the last 72 hours. Anemia Panel: No results for input(s): VITAMINB12, FOLATE, FERRITIN, TIBC, IRON, RETICCTPCT in the last 72 hours. Urine analysis:    Component Value Date/Time   COLORURINE YELLOW 09/03/2015 [place] 09/03/2015 0942   LABSPEC 1.020 09/03/2015 0942   PHURINE 6.0 09/03/2015 0942   GLUCOSEU NEGATIVE 09/03/2015 0942   HGBUR TRACE-INTACT (A) 09/03/2015 0942   HGBUR trace-lysed 08/28/2010 0803   BILIRUBINUR n 07/12/2016 1439   KETONESUR NEGATIVE 09/03/2015 0942   PROTEINUR n 07/12/2016 1439   UROBILINOGEN 1.0 07/12/2016 1439   UROBILINOGEN 0.2 09/03/2015 0942   NITRITE n 07/12/2016 1439   NITRITE NEGATIVE 09/03/2015 0942   LEUKOCYTESUR Negative 07/12/2016 1439   Sepsis  Labs: @LABRCNTIP (procalcitonin:4,lacticidven:4) )No results found for this or any previous visit (from the past 240 hour(s)).   Radiological Exams on Admission: No results found.   Assessment/Plan Principal Problem:   Angio-edema Active Problems:   Depression   GERD    1. Angioedema - cause not clear. For now will keep patient nothing by mouth in case patient needs emergency procedure. Closely observe. Continue with Solu-Medrol Pepcid and when necessary Benadryl. 2. History of GERD and depression - medications on hold, since patient is nothing by mouth. 3. Elevated blood pressure - closely follow blood pressure trends.   DVT prophylaxis: SCDs. Code Status: Full code.  Family Communication: Discussed with patient.  Disposition Plan: Home.  Consults called: None.  Admission status: Inpatient.    Rise Patience MD [facility] Pager [phone].  If 7PM-7AM, please contact night-coverage [email or web address] Password [facility]  11/03/2016, 5:48 AM

## 2016-11-03 NOTE — ED Notes (Signed)
hospitalist in room  

## 2016-11-03 NOTE — Care Management Note (Signed)
Case Management Note  Patient Details  Name: FATEEMA MOTOLA MRN: [id number] Date of Birth: 06/01/1946  Subjective/Objective:   Sirs with angioedema                 Action/Plan:Date:  November 03, 2016 Chart reviewed for concurrent status and case management needs. Will continue to follow patient progress. Discharge Planning: following for needs Expected discharge date: [id number] Velva Harman, BSN, [place], [place]   Expected Discharge Date:   (UNKNOWN)               Expected Discharge Plan:  Home/Self Care  In-House Referral:     Discharge planning Services     Post Acute Care Choice:    Choice offered to:     DME Arranged:    DME Agency:     HH Arranged:    HH Agency:     Status of Service:  In process, will continue to follow  If discussed at Long Length of Stay Meetings, dates discussed:    Additional Comments:  Leeroy Cha, RN 11/03/2016, 9:58 AM

## 2016-11-03 NOTE — Progress Notes (Signed)
PROGRESS NOTE    Emily Horton  [id number] DOB: July 29, 1946 DOA: 11/02/2016 PCP: Laurey Morale, MD    Brief Narrative:  Emily Horton is a 70 y.o. female with history of GERD and depression who has had previous history of recurrent allergic reaction presents to the ER because of difficulty breathing with tongue swelling. Patient started experiencing skin rash and lip swelling 2 weeks ago and was prescribed prednisone taper dose by patient's primary care physician. Patient had finished the prednisone course 2 days ago. Last night patient started developing tongue swelling and difficulty breathing and presented to the ER. On exam patient had diffuse tongue swelling but had no stridor and was able to talk. Patient denies having taken any new medications and has had no  insect bites or used any new detergents or perfumes.  ED Course: Patient was given epinephrine followed by Solu-Medrol Benadryl and Pepcid.   Assessment & Plan:   Principal Problem:   Angio-edema Active Problems:   Depression   GERD  #1 angioedema Questionable etiology. Patient denies any changes in medications or dose changes. Patient denies any recent change in her diet. No change in perfume or detergent. Patient states this is the second episode she's had since this past September/October. Patient with clinical improvement. Patient speaking in full sentences. No stridor. Will check a C4 levels, C1 esterase inhibitor levels, sedimentation rate, CRP, hepatic panel. Add diff to CBC. Increase Solu-Medrol to 60 mg IV every 8 hours. Increase Pepcid to 20 mg IV every 8 hours. Placed on Benadryl 25 mg IV every 8 hours. Will place on clear liquids for now. Monitor.  #2 elevated blood pressure Follow for now.  #3 gastroesophageal reflux disease IV Pepcid.  #4 depression Resume Wellbutrin tomorrow with continued improvement. Resume ativan prn..   DVT prophylaxis: SCDs Code Status: Full Family Communication: Updated patient.  No family at bedside. Disposition Plan: Home when medically stable with resolution/improvement of angioedema with no air compromise and tolerating oral intake.   Consultants:   None  Procedures:   None  Antimicrobials:   None   Subjective: Patient states feeling better than on admission. Patient states some improvement with tongue swelling and oral swelling. Patient states still some right facial swelling. Patient asking for ice chips. Patient denies any shortness of breath. No chest pain.  Objective: Vitals:   11/03/16 0717 11/03/16 0719 11/03/16 0800 11/03/16 0900  BP:  130/67 (!) 147/90   Pulse:  99 (!) 102 99  Resp:  (!) 27 (!) 25 (!) 26  Temp:  98 F (36.7 C)    TempSrc:  Oral    SpO2:  90% 94% 96%  Weight: 84 kg (185 lb 3 oz)     Height: 5\' 5"  (1.651 m)       Intake/Output Summary (Last 24 hours) at 11/03/16 1028 Last data filed at 11/03/16 0942  Gross per 24 hour  Intake          1139.83 ml  Output              300 ml  Net           839.83 ml   Filed Weights   11/02/16 2123 11/03/16 0717  Weight: 81.6 kg (180 lb) 84 kg (185 lb 3 oz)    Examination:  General exam: Appears calm and comfortable.Left jaw with some swelling. No stridor. Respiratory system: Clear to auscultation. Respiratory effort normal. Speaking in full sentences. Cardiovascular system: S1 & S2 heard, RRR. No  JVD, murmurs, rubs, gallops or clicks. No pedal edema. Gastrointestinal system: Abdomen is nondistended, soft and nontender. No organomegaly or masses felt. Normal bowel sounds heard. Central nervous system: Alert and oriented. No focal neurological deficits. Extremities: Symmetric 5 x 5 power. Skin: No rashes, lesions or ulcers Psychiatry: Judgement and insight appear normal. Mood & affect appropriate.     Data Reviewed: I have personally reviewed following labs and imaging studies  CBC:  Recent Labs Lab 11/03/16 0444  WBC 14.4*  NEUTROABS 13.4*  HGB 13.5  HCT 41.4  MCV  95.6  PLT 123456   Basic Metabolic Panel:  Recent Labs Lab 11/03/16 0444  NA 136  K 4.3  CL 107  CO2 21*  GLUCOSE 166*  BUN 18  CREATININE 0.92  CALCIUM 9.2   GFR: Estimated Creatinine Clearance: 62.6 mL/min (by C-G formula based on SCr of 0.92 mg/dL). Liver Function Tests:  Recent Labs Lab 11/03/16 0832  AST 12*  ALT 10*  ALKPHOS 69  BILITOT 1.0  PROT 6.4*  ALBUMIN 4.1   No results for input(s): LIPASE, AMYLASE in the last 168 hours. No results for input(s): AMMONIA in the last 168 hours. Coagulation Profile: No results for input(s): INR, PROTIME in the last 168 hours. Cardiac Enzymes: No results for input(s): CKTOTAL, CKMB, CKMBINDEX, TROPONINI in the last 168 hours. BNP (last 3 results) No results for input(s): PROBNP in the last 8760 hours. HbA1C: No results for input(s): HGBA1C in the last 72 hours. CBG: No results for input(s): GLUCAP in the last 168 hours. Lipid Profile: No results for input(s): CHOL, HDL, LDLCALC, TRIG, CHOLHDL, LDLDIRECT in the last 72 hours. Thyroid Function Tests: No results for input(s): TSH, T4TOTAL, FREET4, T3FREE, THYROIDAB in the last 72 hours. Anemia Panel: No results for input(s): VITAMINB12, FOLATE, FERRITIN, TIBC, IRON, RETICCTPCT in the last 72 hours. Sepsis Labs: No results for input(s): PROCALCITON, LATICACIDVEN in the last 168 hours.  Recent Results (from the past 240 hour(s))  MRSA PCR Screening     Status: None   Collection Time: 11/03/16  6:55 AM  Result Value Ref Range Status   MRSA by PCR NEGATIVE NEGATIVE Final    Comment:        The GeneXpert MRSA Assay (FDA approved for NASAL specimens only), is one component of a comprehensive MRSA colonization surveillance program. It is not intended to diagnose MRSA infection nor to guide or monitor treatment for MRSA infections.          Radiology Studies: No results found.      Scheduled Meds: . diphenhydrAMINE  25 mg Intravenous Q8H  . famotidine  (PEPCID) IV  20 mg Intravenous Q8H  . methylPREDNISolone (SOLU-MEDROL) injection  60 mg Intravenous Q8H   Continuous Infusions: . sodium chloride 50 mL/hr at 11/03/16 0811     LOS: 0 days    Time spent: 28 minutes    THOMPSON,DANIEL, MD [facility] Pager [phone]  If 7PM-7AM, please contact night-coverage [email or web address] Password [email or web address] 11/03/2016, 10:28 AM

## 2016-11-04 LAB — CBC WITH DIFFERENTIAL/PLATELET
Basophils Absolute: 0 10*3/uL (ref 0.0–0.1)
Basophils Relative: 0 %
Eosinophils Absolute: 0 10*3/uL (ref 0.0–0.7)
Eosinophils Relative: 0 %
HCT: 39.5 % (ref 36.0–46.0)
Hemoglobin: 13.1 g/dL (ref 12.0–15.0)
Lymphocytes Relative: 8 %
Lymphs Abs: 1.3 10*3/uL (ref 0.7–4.0)
MCH: 31.6 pg (ref 26.0–34.0)
MCHC: 33.2 g/dL (ref 30.0–36.0)
MCV: 95.4 fL (ref 78.0–100.0)
Monocytes Absolute: 0.6 10*3/uL (ref 0.1–1.0)
Monocytes Relative: 4 %
Neutro Abs: 13.4 10*3/uL — ABNORMAL HIGH (ref 1.7–7.7)
Neutrophils Relative %: 88 %
Platelets: 273 10*3/uL (ref 150–400)
RBC: 4.14 MIL/uL (ref 3.87–5.11)
RDW: 13.7 % (ref 11.5–15.5)
WBC: 15.3 10*3/uL — ABNORMAL HIGH (ref 4.0–10.5)

## 2016-11-04 LAB — BASIC METABOLIC PANEL
Anion gap: 6 (ref 5–15)
BUN: 16 mg/dL (ref 6–20)
CO2: 25 mmol/L (ref 22–32)
Calcium: 9.3 mg/dL (ref 8.9–10.3)
Chloride: 106 mmol/L (ref 101–111)
Creatinine, Ser: 0.8 mg/dL (ref 0.44–1.00)
GFR calc Af Amer: >60 mL/min (ref >60)
GFR calc non Af Amer: >60 mL/min (ref >60)
Glucose, Bld: 150 mg/dL — ABNORMAL HIGH (ref 65–99)
Potassium: 4.1 mmol/L (ref 3.5–5.1)
Sodium: 137 mmol/L (ref 135–145)

## 2016-11-04 LAB — GLUCOSE, CAPILLARY
Glucose-Capillary: 137 mg/dL — ABNORMAL HIGH (ref 65–99)
Glucose-Capillary: 101 mg/dL — ABNORMAL HIGH (ref 65–99)
Glucose-Capillary: 260 mg/dL — ABNORMAL HIGH (ref 65–99)
Glucose-Capillary: 137 mg/dL — ABNORMAL HIGH (ref 65–99)
Glucose-Capillary: 109 mg/dL — ABNORMAL HIGH (ref 65–99)

## 2016-11-04 LAB — C1 ESTERASE INHIBITOR: C1INH SerPl-mCnc: 33 mg/dL (ref 21–39)

## 2016-11-04 LAB — C4 COMPLEMENT: Complement C4, Body Fluid: 27 mg/dL (ref 14–44)

## 2016-11-04 MED ORDER — METHYLPREDNISOLONE SODIUM SUCC 125 MG IJ SOLR
60.0000 mg | Freq: Two times a day (BID) | INTRAMUSCULAR | Status: DC
  Administered 2016-11-04 – 2016-11-05 (×2): 60 mg via INTRAVENOUS
  Filled 2016-11-04 (×2): qty 2

## 2016-11-04 MED ORDER — ASPIRIN EC 81 MG PO TBEC
81.0000 mg | DELAYED_RELEASE_TABLET | Freq: Every day | ORAL | Status: DC
  Administered 2016-11-04 – 2016-11-05 (×2): 81 mg via ORAL
  Filled 2016-11-04 (×2): qty 1

## 2016-11-04 MED ORDER — ATORVASTATIN CALCIUM 40 MG PO TABS
40.0000 mg | ORAL_TABLET | Freq: Every day | ORAL | Status: DC
  Administered 2016-11-04 – 2016-11-05 (×2): 40 mg via ORAL
  Filled 2016-11-04 (×2): qty 1

## 2016-11-04 MED ORDER — LORAZEPAM 0.5 MG PO TABS: 0.5000 mg | ORAL_TABLET | Freq: Two times a day (BID) | ORAL | Status: DC | PRN

## 2016-11-04 MED ORDER — FAMOTIDINE IN NACL 20-0.9 MG/50ML-% IV SOLN: 20.0000 mg | Freq: Two times a day (BID) | INTRAVENOUS | Status: DC

## 2016-11-04 MED ORDER — BUPROPION HCL ER (XL) 300 MG PO TB24
300.0000 mg | ORAL_TABLET | Freq: Every morning | ORAL | Status: DC
  Administered 2016-11-04 – 2016-11-05 (×2): 300 mg via ORAL
  Filled 2016-11-04 (×2): qty 1

## 2016-11-04 MED ORDER — FAMOTIDINE 20 MG PO TABS
20.0000 mg | ORAL_TABLET | Freq: Two times a day (BID) | ORAL | Status: DC
  Administered 2016-11-04 – 2016-11-05 (×3): 20 mg via ORAL
  Filled 2016-11-04 (×3): qty 1

## 2016-11-04 MED ORDER — DIPHENHYDRAMINE HCL 50 MG/ML IJ SOLN: 25.0000 mg | Freq: Two times a day (BID) | INTRAMUSCULAR | Status: DC

## 2016-11-04 MED ORDER — DIPHENHYDRAMINE HCL 25 MG PO CAPS
25.0000 mg | ORAL_CAPSULE | Freq: Two times a day (BID) | ORAL | Status: DC
  Administered 2016-11-04 – 2016-11-05 (×3): 25 mg via ORAL
  Filled 2016-11-04 (×3): qty 1

## 2016-11-04 NOTE — Progress Notes (Signed)
Date:  November 04, 2016 Chart reviewed for concurrent status and case management needs. Will continue to follow patient progress. Transferred from icu to med-surg Discharge Planning: following for needs Expected discharge date: [id number] Rhonda Davis, BSN, [place], [place]

## 2016-11-04 NOTE — Progress Notes (Signed)
PROGRESS NOTE    Emily Horton  [id number] DOB: Feb 08, 1946 DOA: 11/02/2016 PCP: Laurey Morale, MD    Brief Narrative:  Emily Horton is a 70 y.o. female with history of GERD and depression who has had previous history of recurrent allergic reaction presents to the ER because of difficulty breathing with tongue swelling. Patient started experiencing skin rash and lip swelling 2 weeks ago and was prescribed prednisone taper dose by patient's primary care physician. Patient had finished the prednisone course 2 days ago. Last night patient started developing tongue swelling and difficulty breathing and presented to the ER. On exam patient had diffuse tongue swelling but had no stridor and was able to talk. Patient denies having taken any new medications and has had no  insect bites or used any new detergents or perfumes.  ED Course: Patient was given epinephrine followed by Solu-Medrol Benadryl and Pepcid.   Assessment & Plan:   Principal Problem:   Angio-edema Active Problems:   Depression   GERD  #1 angioedema Questionable etiology. Patient denies any changes in medications or dose changes. Patient denies any recent change in her diet. No change in perfume or detergent. Patient states this is the second episode she's had since this past September/October. Patient with clinical improvement. Patient speaking in full sentences. No stridor. C4 levels within normal limits. C1 esterase inhibitor levels pending. Hepatic panel unremarkable., sedimentation rate, sedimentation rate is 14. Complement C4 level is 27. CRP, hepatic panel. Add diff to CBC. Change IV Solu-Medrol to 60 mg IV every 12 hours. Decrease Pepcid to 20 mg every 8 hours. Change Benadryl 25 mg orally twice daily. Will place on clear liquids for now. Monitor.  #2 elevated blood pressure Follow for now.  #3 gastroesophageal reflux disease Change IV Pepcid to oral Pepcid.  #4 depression Resume Wellbutrin. Ativan as  needed.   DVT prophylaxis: SCDs Code Status: Full Family Communication: Updated patient. No family at bedside. Disposition Plan: Transfer to [place]. Home when medically stable with resolution/improvement of angioedema with no air compromise and tolerating oral intake hopefully the next 24-48 hours.   Consultants:   None  Procedures:   None  Antimicrobials:   None   Subjective: Patient states feeling better than on admission. Patient states some improvement with tongue swelling and oral swelling. Patient states still some right facial swelling. Patient tolerated diet and currently tolerating a regular diet. Patient denies any shortness of breath. No chest pain.  Objective: Vitals:   11/04/16 0500 11/04/16 0600 11/04/16 0730 11/04/16 0800  BP:   (!) 140/91 (!) 150/70  Pulse: 83 80 88 88  Resp: 16 15 (!) 26 (!) 26  Temp:      TempSrc:      SpO2: 93% 95% 91% 94%  Weight:      Height:        Intake/Output Summary (Last 24 hours) at 11/04/16 0940 Last data filed at 11/04/16 [id number]  Gross per 24 hour  Intake             1125 ml  Output             1500 ml  Net             -375 ml   Filed Weights   11/02/16 2123 11/03/16 0717  Weight: 81.6 kg (180 lb) 84 kg (185 lb 3 oz)    Examination:  General exam: Appears calm and comfortable.Left jaw with decreased swelling. No stridor. Respiratory system: Clear to auscultation.  Respiratory effort normal. Speaking in full sentences. Cardiovascular system: S1 & S2 heard, RRR. No JVD, murmurs, rubs, gallops or clicks. No pedal edema. Gastrointestinal system: Abdomen is nondistended, soft and nontender. No organomegaly or masses felt. Normal bowel sounds heard. Central nervous system: Alert and oriented. No focal neurological deficits. Extremities: Symmetric 5 x 5 power. Skin: No rashes, lesions or ulcers Psychiatry: Judgement and insight appear normal. Mood & affect appropriate.     Data Reviewed: I have personally reviewed  following labs and imaging studies  CBC:  Recent Labs Lab 11/03/16 0444 11/04/16 0343  WBC 14.4* 15.3*  NEUTROABS 13.4* 13.4*  HGB 13.5 13.1  HCT 41.4 39.5  MCV 95.6 95.4  PLT 280 123456   Basic Metabolic Panel:  Recent Labs Lab 11/03/16 0444 11/04/16 0343  NA 136 137  K 4.3 4.1  CL 107 106  CO2 21* 25  GLUCOSE 166* 150*  BUN 18 16  CREATININE 0.92 0.80  CALCIUM 9.2 9.3   GFR: Estimated Creatinine Clearance: 72 mL/min (by C-G formula based on SCr of 0.8 mg/dL). Liver Function Tests:  Recent Labs Lab 11/03/16 0832  AST 12*  ALT 10*  ALKPHOS 69  BILITOT 1.0  PROT 6.4*  ALBUMIN 4.1   No results for input(s): LIPASE, AMYLASE in the last 168 hours. No results for input(s): AMMONIA in the last 168 hours. Coagulation Profile: No results for input(s): INR, PROTIME in the last 168 hours. Cardiac Enzymes: No results for input(s): CKTOTAL, CKMB, CKMBINDEX, TROPONINI in the last 168 hours. BNP (last 3 results) No results for input(s): PROBNP in the last 8760 hours. HbA1C: No results for input(s): HGBA1C in the last 72 hours. CBG:  Recent Labs Lab 11/03/16 1227 11/03/16 1741 11/03/16 2334 11/04/16 0619  GLUCAP 158* 221* 140* 137*   Lipid Profile: No results for input(s): CHOL, HDL, LDLCALC, TRIG, CHOLHDL, LDLDIRECT in the last 72 hours. Thyroid Function Tests: No results for input(s): TSH, T4TOTAL, FREET4, T3FREE, THYROIDAB in the last 72 hours. Anemia Panel: No results for input(s): VITAMINB12, FOLATE, FERRITIN, TIBC, IRON, RETICCTPCT in the last 72 hours. Sepsis Labs: No results for input(s): PROCALCITON, LATICACIDVEN in the last 168 hours.  Recent Results (from the past 240 hour(s))  MRSA PCR Screening     Status: None   Collection Time: 11/03/16  6:55 AM  Result Value Ref Range Status   MRSA by PCR NEGATIVE NEGATIVE Final    Comment:        The GeneXpert MRSA Assay (FDA approved for NASAL specimens only), is one component of a comprehensive MRSA  colonization surveillance program. It is not intended to diagnose MRSA infection nor to guide or monitor treatment for MRSA infections.          Radiology Studies: No results found.      Scheduled Meds: . atorvastatin  40 mg Oral Daily  . buPROPion  300 mg Oral q morning - 10a  . diphenhydrAMINE  25 mg Intravenous Q12H  . famotidine (PEPCID) IV  20 mg Intravenous Q12H  . methylPREDNISolone (SOLU-MEDROL) injection  60 mg Intravenous Q12H  . nicotine  14 mg Transdermal Daily   Continuous Infusions:    LOS: 1 day    Time spent: 40 minutes    Gustavia Carie, MD [facility] Pager [phone]  If 7PM-7AM, please contact night-coverage [email or web address] Password [email or web address] 11/04/2016, 9:40 AM

## 2016-11-05 DIAGNOSIS — T783XXD Angioneurotic edema, subsequent encounter: Secondary | ICD-10-CM

## 2016-11-05 LAB — BASIC METABOLIC PANEL
Anion gap: 7 (ref 5–15)
BUN: 23 mg/dL — ABNORMAL HIGH (ref 6–20)
CO2: 25 mmol/L (ref 22–32)
Calcium: 9.2 mg/dL (ref 8.9–10.3)
Chloride: 102 mmol/L (ref 101–111)
Creatinine, Ser: 0.93 mg/dL (ref 0.44–1.00)
GFR calc Af Amer: >60 mL/min (ref >60)
GFR calc non Af Amer: >60 mL/min (ref >60)
Glucose, Bld: 123 mg/dL — ABNORMAL HIGH (ref 65–99)
Potassium: 3.9 mmol/L (ref 3.5–5.1)
Sodium: 134 mmol/L — ABNORMAL LOW (ref 135–145)

## 2016-11-05 LAB — GLUCOSE, CAPILLARY
Glucose-Capillary: 127 mg/dL — ABNORMAL HIGH (ref 65–99)
Glucose-Capillary: 118 mg/dL — ABNORMAL HIGH (ref 65–99)

## 2016-11-05 MED ORDER — EPINEPHRINE 0.15 MG/0.3ML IJ SOAJ: 0.1500 mg | INTRAMUSCULAR | 0 refills | Status: DC | PRN

## 2016-11-05 MED ORDER — DIPHENHYDRAMINE HCL 25 MG PO CAPS: 25.0000 mg | ORAL_CAPSULE | Freq: Every day | ORAL | 0 refills | Status: DC

## 2016-11-05 MED ORDER — OMEPRAZOLE 40 MG PO CPDR: 40.0000 mg | DELAYED_RELEASE_CAPSULE | Freq: Two times a day (BID) | ORAL | 0 refills | Status: DC

## 2016-11-05 MED ORDER — FAMOTIDINE 20 MG PO TABS: 20.0000 mg | ORAL_TABLET | Freq: Every day | ORAL | 0 refills | Status: DC

## 2016-11-05 MED ORDER — NICOTINE 14 MG/24HR TD PT24: 14.0000 mg | MEDICATED_PATCH | Freq: Every day | TRANSDERMAL | 3 refills | Status: DC

## 2016-11-05 MED ORDER — PREDNISONE 20 MG PO TABS: 20.0000 mg | ORAL_TABLET | Freq: Every day | ORAL | 0 refills | Status: DC

## 2016-11-05 MED ORDER — PREDNISONE 50 MG PO TABS: 60.0000 mg | ORAL_TABLET | Freq: Every day | ORAL | Status: DC

## 2016-11-05 NOTE — Care Management Note (Signed)
Case Management Note  Patient Details  Name: Emily Horton MRN: [id number] Date of Birth: 1946-02-27  Subjective/Objective:  70 y/o f admitted w/Angioedema. From home.                  Action/Plan:d/c home.   Expected Discharge Date:   (UNKNOWN)               Expected Discharge Plan:  Home/Self Care  In-House Referral:     Discharge planning Services  CM Consult  Post Acute Care Choice:    Choice offered to:     DME Arranged:    DME Agency:     HH Arranged:    [place] Agency:     Status of Service:  Completed, signed off  If discussed at [employer] of Stay Meetings, dates discussed:    Additional Comments:  Dessa Phi, RN 11/05/2016, 12:37 PM

## 2016-11-05 NOTE — Discharge Summary (Signed)
Physician Discharge Summary  Emily Horton [id number] DOB: 10-13-1946 DOA: 11/02/2016  PCP: Laurey Morale, MD  Admit date: 11/02/2016 Discharge date: 11/05/2016  Time spent: 60 minutes  Recommendations for Outpatient Follow-up:  1. Follow-up with FRY,STEPHEN A, MD in 1-2 weeks. On follow-up patient may benefit from a referral to an allergist for further evaluation of her recurrent anginal edema.   Discharge Diagnoses:  Principal Problem:   Angio-edema Active Problems:   Depression   GERD   Discharge Condition: Stable and improved.  Diet recommendation: Regular  Filed Weights   11/02/16 2123 11/03/16 0717  Weight: 81.6 kg (180 lb) 84 kg (185 lb 3 oz)    History of present illness:  Per Dr Hal Hope [name] is a 70 y.o. female with history of GERD and depression who has had previous history of recurrent allergic reaction presents to the ER because of difficulty breathing with tongue swelling. Patient started experiencing skin rash and lip swelling 2 weeks ago and was prescribed prednisone taper dose by patient's primary care physician. Patient had finished the prednisone course 2 days ago. Last night patient started developing tongue swelling and difficulty breathing and presented to the ER. On exam patient had diffuse tongue swelling but had no stridor and was able to talk. Patient denies having taken any new medications and has had no  insect bites or used any new detergents or perfumes.  ED Course: Patient was given epinephrine followed by Solu-Medrol Benadryl and Pepcid.  Hospital Course:  #1 angioedema Questionable etiology. Patient denied any changes in medications or dose changes. Patient denied any recent change in her diet. No change in perfume or detergent. Patient stated this was the second episode she's had since this past September/October.  Patient was admitted and placed in the step down unit and placed on IV Solu-Medrol, IV Pepcid and IV Benadryl. Patient was  monitored. Patient improved clinically. Patient did not have any stridor. Patient was speaking in full sentences. Patient's swelling improved on a daily basis.  C4 levels within normal limits. C1 esterase inhibitor levels pending. Hepatic panel unremarkable., sedimentation rate, sedimentation rate is 14. Complement C4 level is 27.  IV Solu-Medrol was subsequently tapered down as well as IV Pepcid and IV Benadryl. Patient was transitioned to oral prednisone taper, oral Benadryl taper, oral Pepcid taper. Patient diet was advanced and was tolerating a solid diet by day of discharge. Patient be discharged home with the EpiPen and is to follow-up with PCP as outpatient. Patient will benefit from a referral to an allergist.  #2 elevated blood pressure Patient noted to have elevated blood pressure early on during the hospitalization but improved throughout the hospitalization. Outpatient follow-up.   #3 gastroesophageal reflux disease Patient was maintained on H2 blocker during the hospitalization.  #4 depression Patient was placed back on home regimen of Wellbutrin. Ativan as needed.   Procedures:  None  Consultations:  None  Discharge Exam: Vitals:   11/05/16 0800 11/05/16 1325  BP: (!) 144/76 (!) 140/92  Pulse: 80 94  Resp: 20 18  Temp: 98.5 F (36.9 C) 98.3 F (36.8 C)    General: NAD Cardiovascular: RRR Respiratory: CTAB  Discharge Instructions   Discharge Instructions    Diet general    Complete by:  As directed    Discharge instructions    Complete by:  As directed    Follow up with FRY,STEPHEN A, MD in 1-2 weeks.   Increase activity slowly    Complete by:  As directed  Discharge Medication List as of 11/05/2016  1:04 PM    START taking these medications   Details  diphenhydrAMINE (BENADRYL) 25 mg capsule Take 1 capsule (25 mg total) by mouth daily. TAKE 1 TABLET 2 TIME DAILY FOR 2 DAYS, THEN 1 TABLET DAILY X 3 DAYS, THEN STOP., Starting Fri 11/05/2016,  Print    EPINEPHrine (EPIPEN JR) 0.15 MG/0.3ML injection Inject 0.3 mLs (0.15 mg total) into the muscle as needed for anaphylaxis., Starting Fri 11/05/2016, Print    famotidine (PEPCID) 20 MG tablet Take 1 tablet (20 mg total) by mouth daily. Take 1 tablet 2 times daily x 3 days, then 1 tablet daily x 3 days then stop., Starting Fri 11/05/2016, Print    nicotine (NICODERM CQ - DOSED IN MG/24 HOURS) 14 mg/24hr patch Place 1 patch (14 mg total) onto the skin daily., Starting Sat 11/06/2016, Print    predniSONE (DELTASONE) 20 MG tablet Take 1-3 tablets (20-60 mg total) by mouth daily before breakfast. Take 3 tablets (60mg ) daily x 3 days, then 2 tablets (40mg ) daily x 3 days, then 1 tablet (20mg ) daily x 3 days then stop., Starting Sat 11/06/2016, Print      CONTINUE these medications which have CHANGED   Details  omeprazole (PRILOSEC) 40 MG capsule Take 1 capsule (40 mg total) by mouth 2 (two) times daily., Starting Fri 11/12/2016, Print      CONTINUE these medications which have NOT CHANGED   Details  aspirin EC 81 MG tablet Take 81 mg by mouth daily., Historical Med    aspirin-acetaminophen-caffeine (EXCEDRIN MIGRAINE) 250-250-65 MG tablet Take 1 tablet by mouth every 6 (six) hours as needed for headache., Historical Med    atorvastatin (LIPITOR) 40 MG tablet TAKE 1 TABLET BY MOUTH DAILY, Normal    buPROPion (WELLBUTRIN XL) 300 MG 24 hr tablet TAKE 1 TABLET BY MOUTH EVERY MORNING, Normal    LORazepam (ATIVAN) 0.5 MG tablet Take 0.5 mg by mouth as needed for anxiety., Historical Med      STOP taking these medications     meloxicam (MOBIC) 15 MG tablet        Allergies  Allergen Reactions  . Eggs Or Egg-Derived Products     Hives   . Zoloft [Sertraline Hcl]     Headaches    Follow-up Information    FRY,STEPHEN A, MD. Schedule an appointment as soon as possible for a visit in 1 week(s).   Specialty:  Family Medicine Why:  f/u in 1-2 weeks. Contact information: [place] [address] [phone]            The results of significant diagnostics from this hospitalization (including imaging, microbiology, ancillary and laboratory) are listed below for reference.    Significant Diagnostic Studies: No results found.  Microbiology: Recent Results (from the past 240 hour(s))  MRSA PCR Screening     Status: None   Collection Time: 11/03/16  6:55 AM  Result Value Ref Range Status   MRSA by PCR NEGATIVE NEGATIVE Final    Comment:        The GeneXpert MRSA Assay (FDA approved for NASAL specimens only), is one component of a comprehensive MRSA colonization surveillance program. It is not intended to diagnose MRSA infection nor to guide or monitor treatment for MRSA infections.      Labs: Basic Metabolic Panel:  Recent Labs Lab 11/03/16 0444 11/04/16 0343 11/05/16 0507  NA 136 137 134*  K 4.3 4.1 3.9  CL 107 106  102  CO2 21* 25 25  GLUCOSE 166* 150* 123*  BUN 18 16 23*  CREATININE 0.92 0.80 0.93  CALCIUM 9.2 9.3 9.2   Liver Function Tests:  Recent Labs Lab 11/03/16 0832  AST 12*  ALT 10*  ALKPHOS 69  BILITOT 1.0  PROT 6.4*  ALBUMIN 4.1   No results for input(s): LIPASE, AMYLASE in the last 168 hours. No results for input(s): AMMONIA in the last 168 hours. CBC:  Recent Labs Lab 11/03/16 0444 11/04/16 0343  WBC 14.4* 15.3*  NEUTROABS 13.4* 13.4*  HGB 13.5 13.1  HCT 41.4 39.5  MCV 95.6 95.4  PLT 280 273   Cardiac Enzymes: No results for input(s): CKTOTAL, CKMB, CKMBINDEX, TROPONINI in the last 168 hours. BNP: BNP (last 3 results) No results for input(s): BNP in the last 8760 hours.  ProBNP (last 3 results) No results for input(s): PROBNP in the last 8760 hours.  CBG:  Recent Labs Lab 11/04/16 1425 11/04/16 1852 11/04/16 2102 11/05/16 0711 11/05/16 1150  GLUCAP 260* 137* 109* 127* 118*       Signed:  Shereece Wellborn MD.  [facility] 11/05/2016, 6:31 PM

## 2016-11-08 ENCOUNTER — Telehealth: Payer: Self-pay

## 2016-11-08 DIAGNOSIS — L509 Urticaria, unspecified: Secondary | ICD-10-CM | POA: Diagnosis not present

## 2016-11-08 DIAGNOSIS — T783XXA Angioneurotic edema, initial encounter: Secondary | ICD-10-CM | POA: Diagnosis not present

## 2016-11-08 NOTE — Telephone Encounter (Signed)
LMTCB

## 2016-11-08 NOTE — Telephone Encounter (Signed)
D/C 11/05/16 To: home   Spoke with pt and she states that she has seen allergist and is scheduled for allergy testing in the near future. She will keep a log of her diet and any other activities or encounters that may be contributing to her symptoms. Dr. Donneta Romberg has talked with pt about AlphaGal possibility. Discussed this with pt.   Pt scheduled with Dr Sarajane Jews 11/12/16. Pt aware.    Transition Care Management Follow-up Telephone Call  How have you been since you were released from the hospital? Very well   Do you understand why you were in the hospital? yes   Do you understand the discharge instructions? yes  Items Reviewed:  Medications reviewed: yes  Allergies reviewed: yes  Dietary changes reviewed: yes  Referrals reviewed: yes   Functional Questionnaire:   Activities of Daily Living (ADLs):   She states they are independent in the following: ambulation, bathing and hygiene, feeding, continence, grooming, toileting and dressing States they require assistance with the following: none   Any transportation issues/concerns?: no   Any patient concerns? no   Confirmed importance and date/time of follow-up visits scheduled: yes   Confirmed with patient if condition begins to worsen call PCP or go to the ER.  Patient was given the Call-a-Nurse line [phone]: yes

## 2016-11-09 NOTE — Telephone Encounter (Signed)
Make an OV for [place] to evaluate her

## 2016-11-10 NOTE — Telephone Encounter (Signed)
I was following up with pt this morning and she has been in the hospital, she is no longer having any type of reaction. She has a hospital follow up scheduled with Dr. Sarajane Jews on 11/12/2016.

## 2016-11-12 ENCOUNTER — Encounter: Payer: Self-pay | Admitting: Family Medicine

## 2016-11-12 ENCOUNTER — Ambulatory Visit (INDEPENDENT_AMBULATORY_CARE_PROVIDER_SITE_OTHER): Payer: Medicare Other | Admitting: Family Medicine

## 2016-11-12 VITALS — BP 115/73 | HR 87 | Temp 98.8°F | Ht 65.0 in | Wt 185.0 lb

## 2016-11-12 DIAGNOSIS — I10 Essential (primary) hypertension: Secondary | ICD-10-CM

## 2016-11-12 DIAGNOSIS — T783XXD Angioneurotic edema, subsequent encounter: Secondary | ICD-10-CM

## 2016-11-12 NOTE — Progress Notes (Signed)
Pre visit review using our clinic review tool, if applicable. No additional management support is needed unless otherwise documented below in the visit note. 

## 2016-11-12 NOTE — Progress Notes (Signed)
   Subjective:    Patient ID: Emily Horton, female    DOB: 1946/10/22, 71 y.o.   MRN: [id number]  HPI Here for a transitional care management visit to follow up a hospital stay from 11-02-16 to 11-05-16 for angioedema. She presented with facial and lip swelling and difficulty breathing. She was treated with IV epinephrine, prednisone, famotidine and Benadryl. She responded well and was able to go home free of symptoms. She saw Dr. Mosetta Anis last week for an allergy evaluation and he started her on Ranitidine, Cetirizine, and Montelucast. She has remained symptom free. She is scheduled for full allergy skin testing on 12-27-16. She has an Epipen if needed.    Review of Systems  Constitutional: Negative.   HENT: Negative.   Eyes: Negative.   Respiratory: Negative.   Cardiovascular: Negative.   Neurological: Negative.        Objective:   Physical Exam  Constitutional: She is oriented to person, place, and time. She appears well-developed and well-nourished.  HENT:  Head: Normocephalic and atraumatic.  Right Ear: External ear normal.  Left Ear: External ear normal.  Nose: Nose normal.  Mouth/Throat: Oropharynx is clear and moist.  Eyes: Conjunctivae and EOM are normal. Pupils are equal, round, and reactive to light.  Neck: Neck supple. No thyromegaly present.  Cardiovascular: Normal rate, regular rhythm, normal heart sounds and intact distal pulses.   Pulmonary/Chest: Effort normal and breath sounds normal.  Musculoskeletal: She exhibits no edema.  Lymphadenopathy:    She has no cervical adenopathy.  Neurological: She is alert and oriented to person, place, and time.          Assessment & Plan:  Her angioedema is currently in remission. No etiology has been found but she is undergoing an Allergy workup. Stay on current meds. Her HTN is stable.  Laurey Morale, MD

## 2016-11-17 ENCOUNTER — Telehealth: Payer: Self-pay | Admitting: Family Medicine

## 2016-11-17 NOTE — Telephone Encounter (Signed)
I spoke with pt and went over below information, she will contact Dr. Donneta Romberg.

## 2016-11-17 NOTE — Telephone Encounter (Signed)
She should take Benadryl every 4 hours and contact Dr. Donneta Romberg (Allergy)

## 2016-11-17 NOTE — Telephone Encounter (Signed)
° ° ° °  Pt said her rash has return and is itching like crazy. She called today to ask what can she do . Would like a call back    [phone]

## 2016-11-19 DIAGNOSIS — L509 Urticaria, unspecified: Secondary | ICD-10-CM | POA: Diagnosis not present

## 2016-11-23 DIAGNOSIS — L508 Other urticaria: Secondary | ICD-10-CM | POA: Diagnosis not present

## 2016-11-23 DIAGNOSIS — L723 Sebaceous cyst: Secondary | ICD-10-CM | POA: Diagnosis not present

## 2016-11-23 DIAGNOSIS — L989 Disorder of the skin and subcutaneous tissue, unspecified: Secondary | ICD-10-CM | POA: Diagnosis not present

## 2016-11-24 DIAGNOSIS — L729 Follicular cyst of the skin and subcutaneous tissue, unspecified: Secondary | ICD-10-CM | POA: Diagnosis not present

## 2016-12-27 DIAGNOSIS — T783XXD Angioneurotic edema, subsequent encounter: Secondary | ICD-10-CM | POA: Diagnosis not present

## 2016-12-27 DIAGNOSIS — J3089 Other allergic rhinitis: Secondary | ICD-10-CM | POA: Diagnosis not present

## 2016-12-27 DIAGNOSIS — L509 Urticaria, unspecified: Secondary | ICD-10-CM | POA: Diagnosis not present

## 2017-01-14 ENCOUNTER — Other Ambulatory Visit: Payer: Self-pay | Admitting: Family Medicine

## 2017-02-08 DIAGNOSIS — H5203 Hypermetropia, bilateral: Secondary | ICD-10-CM | POA: Diagnosis not present

## 2017-02-08 DIAGNOSIS — H524 Presbyopia: Secondary | ICD-10-CM | POA: Diagnosis not present

## 2017-02-08 DIAGNOSIS — H52223 Regular astigmatism, bilateral: Secondary | ICD-10-CM | POA: Diagnosis not present

## 2017-02-08 DIAGNOSIS — H2513 Age-related nuclear cataract, bilateral: Secondary | ICD-10-CM | POA: Diagnosis not present

## 2017-03-11 ENCOUNTER — Telehealth: Payer: Self-pay | Admitting: Family Medicine

## 2017-03-24 ENCOUNTER — Telehealth: Payer: Self-pay | Admitting: Family Medicine

## 2017-03-24 NOTE — Telephone Encounter (Signed)
She needs an OV for this  

## 2017-03-24 NOTE — Telephone Encounter (Signed)
Pt decline to schedule an appt

## 2017-03-24 NOTE — Telephone Encounter (Signed)
Pt was last seen in dec 2017 and would like abx for uti. Pt knows she has uti and decline an appt. [facility]

## 2017-04-07 ENCOUNTER — Other Ambulatory Visit: Payer: Self-pay | Admitting: Family Medicine

## 2017-04-19 NOTE — Telephone Encounter (Signed)
Called to see if pt wanted to schedule awv -no answer / no voicemail.  

## 2017-07-01 ENCOUNTER — Other Ambulatory Visit: Payer: Self-pay | Admitting: Family Medicine

## 2017-07-01 NOTE — Telephone Encounter (Signed)
Call in #[phone] with 3 rf 

## 2017-07-01 NOTE — Telephone Encounter (Signed)
Looks like its been awhile since script was filled, can we refill this?

## 2017-07-04 ENCOUNTER — Other Ambulatory Visit: Payer: Self-pay | Admitting: Family Medicine

## 2017-07-04 DIAGNOSIS — Z1231 Encounter for screening mammogram for malignant neoplasm of breast: Secondary | ICD-10-CM

## 2017-07-10 ENCOUNTER — Other Ambulatory Visit: Payer: Self-pay | Admitting: Family Medicine

## 2017-07-15 ENCOUNTER — Ambulatory Visit
Admission: RE | Admit: 2017-07-15 | Discharge: 2017-07-15 | Disposition: A | Discharge status: Home / Self Care | Payer: Medicare Other | Source: Ambulatory Visit | Attending: Family Medicine | Admitting: Family Medicine

## 2017-07-15 DIAGNOSIS — Z1231 Encounter for screening mammogram for malignant neoplasm of breast: Secondary | ICD-10-CM | POA: Diagnosis not present

## 2017-08-09 DIAGNOSIS — H2513 Age-related nuclear cataract, bilateral: Secondary | ICD-10-CM | POA: Diagnosis not present

## 2017-08-15 DIAGNOSIS — M7541 Impingement syndrome of right shoulder: Secondary | ICD-10-CM | POA: Diagnosis not present

## 2017-08-25 ENCOUNTER — Encounter: Payer: Self-pay | Admitting: Family Medicine

## 2017-08-26 ENCOUNTER — Encounter: Payer: Self-pay | Admitting: Family Medicine

## 2017-08-26 ENCOUNTER — Ambulatory Visit (INDEPENDENT_AMBULATORY_CARE_PROVIDER_SITE_OTHER): Payer: Medicare Other | Admitting: Family Medicine

## 2017-08-26 VITALS — BP 109/88 | HR 98 | Temp 98.8°F | Ht 65.0 in | Wt 187.0 lb

## 2017-08-26 DIAGNOSIS — F324 Major depressive disorder, single episode, in partial remission: Secondary | ICD-10-CM | POA: Diagnosis not present

## 2017-08-26 DIAGNOSIS — K589 Irritable bowel syndrome without diarrhea: Secondary | ICD-10-CM | POA: Diagnosis not present

## 2017-08-26 DIAGNOSIS — K219 Gastro-esophageal reflux disease without esophagitis: Secondary | ICD-10-CM | POA: Diagnosis not present

## 2017-08-26 DIAGNOSIS — E785 Hyperlipidemia, unspecified: Secondary | ICD-10-CM | POA: Diagnosis not present

## 2017-08-26 LAB — CBC WITH DIFFERENTIAL/PLATELET
Basophils Absolute: 0 10*3/uL (ref 0.0–0.1)
Basophils Relative: 0.4 % (ref 0.0–3.0)
Eosinophils Absolute: 0.2 10*3/uL (ref 0.0–0.7)
Eosinophils Relative: 1.9 % (ref 0.0–5.0)
HCT: 45.4 % (ref 36.0–46.0)
Hemoglobin: 15 g/dL (ref 12.0–15.0)
Lymphocytes Relative: 31.6 % (ref 12.0–46.0)
Lymphs Abs: 2.7 10*3/uL (ref 0.7–4.0)
MCHC: 33 g/dL (ref 30.0–36.0)
MCV: 95.5 fl (ref 78.0–100.0)
Monocytes Absolute: 0.8 10*3/uL (ref 0.1–1.0)
Monocytes Relative: 9.7 % (ref 3.0–12.0)
Neutro Abs: 4.9 10*3/uL (ref 1.4–7.7)
Neutrophils Relative %: 56.4 % (ref 43.0–77.0)
Platelets: 271 10*3/uL (ref 150.0–400.0)
RBC: 4.75 Mil/uL (ref 3.87–5.11)
RDW: 13.5 % (ref 11.5–15.5)
WBC: 8.6 10*3/uL (ref 4.0–10.5)

## 2017-08-26 LAB — POC URINALSYSI DIPSTICK (AUTOMATED)
Bilirubin, UA: NEGATIVE
Blood, UA: NEGATIVE
Glucose, UA: NEGATIVE
Leukocytes, UA: NEGATIVE
Nitrite, UA: NEGATIVE
Spec Grav, UA: >=1.03 — AB (ref 1.010–1.025)
Urobilinogen, UA: 0.2 E.U./dL
pH, UA: 6 (ref 5.0–8.0)

## 2017-08-26 LAB — HEPATIC FUNCTION PANEL
ALT: 7 U/L (ref 0–35)
AST: 8 U/L (ref 0–37)
Albumin: 4.4 g/dL (ref 3.5–5.2)
Alkaline Phosphatase: 77 U/L (ref 39–117)
Bilirubin, Direct: 0.2 mg/dL (ref 0.0–0.3)
Total Bilirubin: 0.9 mg/dL (ref 0.2–1.2)
Total Protein: 6.4 g/dL (ref 6.0–8.3)

## 2017-08-26 LAB — LIPID PANEL
Cholesterol: 183 mg/dL (ref 0–200)
HDL: 44.5 mg/dL (ref >39.00)
LDL Cholesterol: 106 mg/dL — ABNORMAL HIGH (ref 0–99)
NonHDL: 138.39
Total CHOL/HDL Ratio: 4
Triglycerides: 161 mg/dL — ABNORMAL HIGH (ref 0.0–149.0)
VLDL: 32.2 mg/dL (ref 0.0–40.0)

## 2017-08-26 LAB — BASIC METABOLIC PANEL
BUN: 16 mg/dL (ref 6–23)
CO2: 26 mEq/L (ref 19–32)
Calcium: 10 mg/dL (ref 8.4–10.5)
Chloride: 104 mEq/L (ref 96–112)
Creatinine, Ser: 1.06 mg/dL (ref 0.40–1.20)
GFR: 54.61 mL/min — ABNORMAL LOW (ref >60.00)
Glucose, Bld: 101 mg/dL — ABNORMAL HIGH (ref 70–99)
Potassium: 4.2 mEq/L (ref 3.5–5.1)
Sodium: 139 mEq/L (ref 135–145)

## 2017-08-26 LAB — TSH: TSH: 2.52 u[IU]/mL (ref 0.35–4.50)

## 2017-08-26 MED ORDER — HYOSCYAMINE SULFATE 0.125 MG SL SUBL: 0.1250 mg | SUBLINGUAL_TABLET | SUBLINGUAL | 11 refills | Status: DC | PRN

## 2017-08-26 MED ORDER — ATORVASTATIN CALCIUM 40 MG PO TABS: 40.0000 mg | ORAL_TABLET | Freq: Every day | ORAL | 3 refills | Status: DC

## 2017-08-26 MED ORDER — BUPROPION HCL ER (XL) 300 MG PO TB24: 300.0000 mg | ORAL_TABLET | Freq: Every morning | ORAL | 3 refills | Status: DC

## 2017-08-26 MED ORDER — ESCITALOPRAM OXALATE 20 MG PO TABS: 20.0000 mg | ORAL_TABLET | Freq: Every day | ORAL | 3 refills | Status: DC

## 2017-08-26 MED ORDER — RANITIDINE HCL 150 MG PO TABS: 150.0000 mg | ORAL_TABLET | Freq: Two times a day (BID) | ORAL | 11 refills | Status: DC

## 2017-08-26 MED ORDER — CETIRIZINE HCL 10 MG PO TABS: 10.0000 mg | ORAL_TABLET | Freq: Every day | ORAL | 11 refills | Status: DC

## 2017-08-26 MED ORDER — NICOTINE 14 MG/24HR TD PT24: 14.0000 mg | MEDICATED_PATCH | Freq: Every day | TRANSDERMAL | 5 refills | Status: DC

## 2017-08-26 NOTE — Progress Notes (Signed)
   Subjective:    Patient ID: Emily Horton, female    DOB: 24-Aug-1946, 71 y.o.   MRN: [id number]  HPI Here to follow up. She has been struggling a bit with depression and with grieving over the loss of her husband, but she thinks she is getting better. She still meets with the Hospice therapists. She sleeps well. She has joined a gym and she works out with her daughter. She wants to quite smoking.    Review of Systems  Constitutional: Negative.   HENT: Negative.   Eyes: Negative.   Respiratory: Negative.   Cardiovascular: Negative.   Gastrointestinal: Negative.   Genitourinary: Negative for decreased urine volume, difficulty urinating, dyspareunia, dysuria, enuresis, flank pain, frequency, hematuria, pelvic pain and urgency.  Musculoskeletal: Negative.   Skin: Negative.   Neurological: Negative.   Psychiatric/Behavioral: Positive for dysphoric mood. Negative for agitation, behavioral problems, confusion, decreased concentration, hallucinations and sleep disturbance. The patient is nervous/anxious.        Objective:   Physical Exam  Constitutional: She is oriented to person, place, and time. She appears well-developed and well-nourished. No distress.  HENT:  Head: Normocephalic and atraumatic.  Right Ear: External ear normal.  Left Ear: External ear normal.  Nose: Nose normal.  Mouth/Throat: Oropharynx is clear and moist. No oropharyngeal exudate.  Eyes: Pupils are equal, round, and reactive to light. Conjunctivae and EOM are normal. No scleral icterus.  Neck: Normal range of motion. Neck supple. No JVD present. No thyromegaly present.  Cardiovascular: Normal rate, regular rhythm, normal heart sounds and intact distal pulses.  Exam reveals no gallop and no friction rub.   No murmur heard. Pulmonary/Chest: Effort normal and breath sounds normal. No respiratory distress. She has no wheezes. She has no rales. She exhibits no tenderness.  Abdominal: Soft. Bowel sounds are normal. She  exhibits no distension and no mass. There is no tenderness. There is no rebound and no guarding.  Musculoskeletal: Normal range of motion. She exhibits no edema or tenderness.  Lymphadenopathy:    She has no cervical adenopathy.  Neurological: She is alert and oriented to person, place, and time. She has normal reflexes. No cranial nerve deficit. She exhibits normal muscle tone. Coordination normal.  Skin: Skin is warm and dry. No rash noted. No erythema.  Psychiatric: She has a normal mood and affect. Her behavior is normal. Judgment and thought content normal.          Assessment & Plan:  She is still working through the grief process. We wil increase the Lexapro to 20 mg daily. Stay on Wellbutrin. She will try nicotine patches to quit smoking. Her GERD and IBS are stable. She will get fasting labs today to check lipids, etc.  Alysia Penna, MD

## 2017-08-26 NOTE — Patient Instructions (Signed)
WE NOW OFFER   [facility] FAST TRACK!!!  SAME DAY Appointments for ACUTE CARE  Such as: Sprains, Injuries, cuts, abrasions, rashes, muscle pain, joint pain, back pain Colds, flu, sore throats, headache, allergies, cough, fever  Ear pain, sinus and eye infections Abdominal pain, nausea, vomiting, diarrhea, upset stomach Animal/insect bites  3 Easy Ways to Schedule: Walk-In Scheduling Call in scheduling Mychart Sign-up: [email or web address]         

## 2017-09-12 DIAGNOSIS — M7541 Impingement syndrome of right shoulder: Secondary | ICD-10-CM | POA: Diagnosis not present

## 2017-11-01 DIAGNOSIS — R21 Rash and other nonspecific skin eruption: Secondary | ICD-10-CM | POA: Diagnosis not present

## 2017-11-07 ENCOUNTER — Encounter: Payer: Self-pay | Admitting: Family Medicine

## 2017-11-08 ENCOUNTER — Encounter: Payer: Self-pay | Admitting: Family Medicine

## 2017-11-08 ENCOUNTER — Ambulatory Visit (INDEPENDENT_AMBULATORY_CARE_PROVIDER_SITE_OTHER): Payer: Medicare Other | Admitting: Family Medicine

## 2017-11-08 VITALS — BP 150/68 | HR 94 | Temp 98.4°F | Wt 191.2 lb

## 2017-11-08 DIAGNOSIS — L509 Urticaria, unspecified: Secondary | ICD-10-CM | POA: Diagnosis not present

## 2017-11-08 MED ORDER — PREDNISONE 10 MG PO TABS
ORAL_TABLET | ORAL | 0 refills | Status: DC
Start: 2017-11-08 — End: 2017-12-02

## 2017-11-08 NOTE — Progress Notes (Signed)
   Subjective:    Patient ID: [name], female    DOB: 16-Sep-1946, 71 y.o.   MRN: [id number]  HPI Here for another bout of hives. She went to urgent care on 11-02-17 and was given a shot of steroids. She followed this with 4 days of oral prednisone. The hives went away for about a week but then returned. She has itchy rashes on the hands and trunk. No SOB.    Review of Systems  Constitutional: Negative.   Respiratory: Negative.   Cardiovascular: Negative.   Skin: Positive for rash.       Objective:   Physical Exam  Constitutional: She appears well-developed and well-nourished.  Cardiovascular: Normal rate, regular rhythm, normal heart sounds and intact distal pulses.  Pulmonary/Chest: Effort normal and breath sounds normal. No respiratory distress. She has no wheezes. She has no rales.  Skin:  Red urticaria over the hands and trunk           Assessment & Plan:  Recurrent hives. We will start her on oral prednisone again but with a longer taper to avoid a rebound. Begin with 40 mg a day and continue over 20 days. She will add Zantac 150 mg bid and Zyrtec 10 mg bid. Recheck prn. Alysia Penna, MD

## 2017-11-09 NOTE — Telephone Encounter (Signed)
She was seen OV yesterday

## 2017-12-02 ENCOUNTER — Other Ambulatory Visit: Payer: Self-pay | Admitting: Family Medicine

## 2017-12-02 NOTE — Telephone Encounter (Signed)
Pt states she has hives all over and under her arms, left side on left area. Pt states her hands are swelling again.  Pt states the last visit dr fry was going to make a note if this did not resolve, that she could get a refill of the prednisone. Pt states she is having a relapse and due to the holiday, she is hoping to get this today.   [facility] [address] [phone] (Phone) [phone] (Fax)

## 2017-12-02 NOTE — Telephone Encounter (Signed)
Sent to [facility] due to PCP being gone

## 2017-12-02 NOTE — Telephone Encounter (Signed)
Called pt and left a VM to call back in regards to her refill request for the prednisone.

## 2017-12-02 NOTE — Telephone Encounter (Signed)
Ok to refill one additional time. Needs to follow up if hives continue

## 2017-12-02 NOTE — Telephone Encounter (Signed)
Please find out what is going on with the patient

## 2017-12-02 NOTE — Telephone Encounter (Signed)
Last seen 11/08/2017 for hives. Rx was last refilled 11/08/2017 disp 60 with no refills. Sent to [facility] due to Dr. Sarajane Jews being out.  Thanks

## 2017-12-22 ENCOUNTER — Encounter: Payer: Self-pay | Admitting: Family Medicine

## 2017-12-22 ENCOUNTER — Ambulatory Visit (INDEPENDENT_AMBULATORY_CARE_PROVIDER_SITE_OTHER): Payer: Medicare Other | Admitting: Family Medicine

## 2017-12-22 ENCOUNTER — Other Ambulatory Visit: Payer: Self-pay | Admitting: Family Medicine

## 2017-12-22 VITALS — BP 170/90 | HR 93 | Temp 98.0°F | Wt 195.0 lb

## 2017-12-22 DIAGNOSIS — L509 Urticaria, unspecified: Secondary | ICD-10-CM

## 2017-12-22 LAB — CBC WITH DIFFERENTIAL/PLATELET
Basophils Absolute: 0 10*3/uL (ref 0.0–0.1)
Basophils Relative: 0.2 % (ref 0.0–3.0)
Eosinophils Absolute: 0.2 10*3/uL (ref 0.0–0.7)
Eosinophils Relative: 2.1 % (ref 0.0–5.0)
HCT: 42.4 % (ref 36.0–46.0)
Hemoglobin: 13.9 g/dL (ref 12.0–15.0)
Lymphocytes Relative: 37.2 % (ref 12.0–46.0)
Lymphs Abs: 3.5 10*3/uL (ref 0.7–4.0)
MCHC: 32.8 g/dL (ref 30.0–36.0)
MCV: 95.8 fl (ref 78.0–100.0)
Monocytes Absolute: 0.8 10*3/uL (ref 0.1–1.0)
Monocytes Relative: 8.3 % (ref 3.0–12.0)
Neutro Abs: 5 10*3/uL (ref 1.4–7.7)
Neutrophils Relative %: 52.2 % (ref 43.0–77.0)
Platelets: 243 10*3/uL (ref 150.0–400.0)
RBC: 4.43 Mil/uL (ref 3.87–5.11)
RDW: 13.6 % (ref 11.5–15.5)
WBC: 9.5 10*3/uL (ref 4.0–10.5)

## 2017-12-22 LAB — SEDIMENTATION RATE: Sed Rate: 4 mm/hr (ref 0–30)

## 2017-12-22 LAB — HEPATIC FUNCTION PANEL
ALT: 7 U/L (ref 0–35)
AST: 10 U/L (ref 0–37)
Albumin: 4.1 g/dL (ref 3.5–5.2)
Alkaline Phosphatase: 61 U/L (ref 39–117)
Bilirubin, Direct: 0.1 mg/dL (ref 0.0–0.3)
Total Bilirubin: 0.6 mg/dL (ref 0.2–1.2)
Total Protein: 5.9 g/dL — ABNORMAL LOW (ref 6.0–8.3)

## 2017-12-22 LAB — BASIC METABOLIC PANEL
BUN: 20 mg/dL (ref 6–23)
CO2: 27 mEq/L (ref 19–32)
Calcium: 9.1 mg/dL (ref 8.4–10.5)
Chloride: 104 mEq/L (ref 96–112)
Creatinine, Ser: 1 mg/dL (ref 0.40–1.20)
GFR: 58.35 mL/min — ABNORMAL LOW (ref >60.00)
Glucose, Bld: 96 mg/dL (ref 70–99)
Potassium: 4 mEq/L (ref 3.5–5.1)
Sodium: 139 mEq/L (ref 135–145)

## 2017-12-22 LAB — C-REACTIVE PROTEIN: CRP: 0.9 mg/dL (ref 0.5–20.0)

## 2017-12-22 LAB — TSH: TSH: 1.2 u[IU]/mL (ref 0.35–4.50)

## 2017-12-22 MED ORDER — HYOSCYAMINE SULFATE 0.125 MG SL SUBL: 0.1250 mg | SUBLINGUAL_TABLET | SUBLINGUAL | 5 refills | Status: DC | PRN

## 2017-12-22 NOTE — Progress Notes (Signed)
   Subjective:    Patient ID: Emily Horton, female    DOB: 1946-09-21, 72 y.o.   MRN: [id number]  HPI Here to follow up on hives. We saw her on 11-08-17 and gave her a prednisone taper. Each time she takes steroids the hives go away briefly but then they return. She now again has itchy hives over the trunk. She saw Dr. Donneta Romberg once a year ago and allergy testing was negative. She is still taking Zyrtec and Zantac daily.    Review of Systems  Constitutional: Negative.   Respiratory: Negative.   Cardiovascular: Negative.   Skin: Positive for rash.       Objective:   Physical Exam  Constitutional: She appears well-developed and well-nourished.  Cardiovascular: Normal rate, regular rhythm, normal heart sounds and intact distal pulses.  Pulmonary/Chest: Effort normal and breath sounds normal. No respiratory distress. She has no wheezes. She has no rales.  Skin:  Urticaria over the trunk           Assessment & Plan:  Hives. We will check some labs today including some inflammation markers. She will stop the Lipitor for one month to see if that helps. She is scheduled to see Dr. Donneta Romberg on 12-27-17.  Alysia Penna, MD

## 2018-01-09 DIAGNOSIS — J3089 Other allergic rhinitis: Secondary | ICD-10-CM | POA: Diagnosis not present

## 2018-01-09 DIAGNOSIS — T783XXD Angioneurotic edema, subsequent encounter: Secondary | ICD-10-CM | POA: Diagnosis not present

## 2018-01-09 DIAGNOSIS — L509 Urticaria, unspecified: Secondary | ICD-10-CM | POA: Diagnosis not present

## 2018-04-24 ENCOUNTER — Ambulatory Visit: Payer: Medicare Other | Admitting: Family Medicine

## 2018-06-12 ENCOUNTER — Encounter: Payer: Self-pay | Admitting: Family Medicine

## 2018-06-12 ENCOUNTER — Ambulatory Visit (INDEPENDENT_AMBULATORY_CARE_PROVIDER_SITE_OTHER): Payer: Medicare Other | Admitting: Family Medicine

## 2018-06-12 VITALS — BP 116/80 | HR 100 | Temp 98.2°F | Ht 64.0 in | Wt 188.6 lb

## 2018-06-12 DIAGNOSIS — K589 Irritable bowel syndrome without diarrhea: Secondary | ICD-10-CM | POA: Diagnosis not present

## 2018-06-12 DIAGNOSIS — F324 Major depressive disorder, single episode, in partial remission: Secondary | ICD-10-CM

## 2018-06-12 DIAGNOSIS — J3089 Other allergic rhinitis: Secondary | ICD-10-CM | POA: Insufficient documentation

## 2018-06-12 DIAGNOSIS — E785 Hyperlipidemia, unspecified: Secondary | ICD-10-CM | POA: Diagnosis not present

## 2018-06-12 DIAGNOSIS — K219 Gastro-esophageal reflux disease without esophagitis: Secondary | ICD-10-CM

## 2018-06-12 LAB — CBC WITH DIFFERENTIAL/PLATELET
Basophils Absolute: 0.1 10*3/uL (ref 0.0–0.1)
Basophils Relative: 0.7 % (ref 0.0–3.0)
Eosinophils Absolute: 0.1 10*3/uL (ref 0.0–0.7)
Eosinophils Relative: 1.3 % (ref 0.0–5.0)
HCT: 43.9 % (ref 36.0–46.0)
Hemoglobin: 14.8 g/dL (ref 12.0–15.0)
Lymphocytes Relative: 29.8 % (ref 12.0–46.0)
Lymphs Abs: 2.3 10*3/uL (ref 0.7–4.0)
MCHC: 33.8 g/dL (ref 30.0–36.0)
MCV: 93.2 fl (ref 78.0–100.0)
Monocytes Absolute: 0.6 10*3/uL (ref 0.1–1.0)
Monocytes Relative: 7.8 % (ref 3.0–12.0)
Neutro Abs: 4.8 10*3/uL (ref 1.4–7.7)
Neutrophils Relative %: 60.4 % (ref 43.0–77.0)
Platelets: 277 10*3/uL (ref 150.0–400.0)
RBC: 4.71 Mil/uL (ref 3.87–5.11)
RDW: 13.8 % (ref 11.5–15.5)
WBC: 7.9 10*3/uL (ref 4.0–10.5)

## 2018-06-12 LAB — BASIC METABOLIC PANEL
BUN: 13 mg/dL (ref 6–23)
CO2: 27 mEq/L (ref 19–32)
Calcium: 9.8 mg/dL (ref 8.4–10.5)
Chloride: 101 mEq/L (ref 96–112)
Creatinine, Ser: 0.88 mg/dL (ref 0.40–1.20)
GFR: 67.54 mL/min (ref >60.00)
Glucose, Bld: 97 mg/dL (ref 70–99)
Potassium: 4.5 mEq/L (ref 3.5–5.1)
Sodium: 136 mEq/L (ref 135–145)

## 2018-06-12 LAB — POC URINALSYSI DIPSTICK (AUTOMATED)
Bilirubin, UA: NEGATIVE
Blood, UA: POSITIVE
Glucose, UA: NEGATIVE
Ketones, UA: NEGATIVE
Leukocytes, UA: NEGATIVE
Nitrite, UA: NEGATIVE
Protein, UA: POSITIVE — AB
Spec Grav, UA: 1.015 (ref 1.010–1.025)
Urobilinogen, UA: 1 E.U./dL
pH, UA: 7 (ref 5.0–8.0)

## 2018-06-12 LAB — HEPATIC FUNCTION PANEL
ALT: 7 U/L (ref 0–35)
AST: 10 U/L (ref 0–37)
Albumin: 4.4 g/dL (ref 3.5–5.2)
Alkaline Phosphatase: 94 U/L (ref 39–117)
Bilirubin, Direct: 0.1 mg/dL (ref 0.0–0.3)
Total Bilirubin: 0.6 mg/dL (ref 0.2–1.2)
Total Protein: 6.3 g/dL (ref 6.0–8.3)

## 2018-06-12 LAB — LIPID PANEL
Cholesterol: 252 mg/dL — ABNORMAL HIGH (ref 0–200)
HDL: 46.7 mg/dL (ref >39.00)
LDL Cholesterol: 170 mg/dL — ABNORMAL HIGH (ref 0–99)
NonHDL: 205.24
Total CHOL/HDL Ratio: 5
Triglycerides: 178 mg/dL — ABNORMAL HIGH (ref 0.0–149.0)
VLDL: 35.6 mg/dL (ref 0.0–40.0)

## 2018-06-12 LAB — TSH: TSH: 2.8 u[IU]/mL (ref 0.35–4.50)

## 2018-06-12 MED ORDER — OMEPRAZOLE 40 MG PO CPDR: 40.0000 mg | DELAYED_RELEASE_CAPSULE | Freq: Every day | ORAL | 3 refills | Status: DC

## 2018-06-12 MED ORDER — CETIRIZINE HCL 10 MG PO TABS: 10.0000 mg | ORAL_TABLET | Freq: Every day | ORAL | 3 refills | Status: DC

## 2018-06-12 NOTE — Progress Notes (Signed)
Subjective:    Patient ID: Emily Horton, female    DOB: September 03, 1946, 72 y.o.   MRN: [id number]  HPI Here to follow up on issues. Her depression has been a problem for her, and she describes some sadness and having a hard time getting motivated to be around people. She sleeps well. She admits to taking Wellbutrin daily but she only takes Lexapro once in awhile "when I need it". She is exercising at the gym. Her BMs have been fairly regular since se started taking magnesium supplements, but she still feel bloated a lot and passes a fair amount of flatus. Her GERD is well controlled. Her allergies are stable. She stopped seeing Dr. Donneta Romberg and asks if we can manage her allergy meds instead.    Review of Systems  Constitutional: Negative.  Negative for activity change, appetite change, diaphoresis, fatigue, fever and unexpected weight change.  HENT: Negative.  Negative for congestion, ear pain, hearing loss, nosebleeds, sore throat, tinnitus, trouble swallowing and voice change.   Eyes: Negative.  Negative for photophobia, pain, discharge, redness and visual disturbance.  Respiratory: Negative.  Negative for apnea, cough, choking, chest tightness, shortness of breath, wheezing and stridor.   Cardiovascular: Negative.  Negative for chest pain, palpitations and leg swelling.  Gastrointestinal: Positive for abdominal distention. Negative for abdominal pain, blood in stool, constipation, diarrhea, nausea, rectal pain and vomiting.  Genitourinary: Negative.  Negative for decreased urine volume, difficulty urinating, dyspareunia, dysuria, enuresis, flank pain, frequency, hematuria, menstrual problem, pelvic pain and urgency.  Musculoskeletal: Negative.  Negative for arthralgias, back pain, gait problem, joint swelling, myalgias, neck pain and neck stiffness.  Skin: Negative.  Negative for color change, pallor, rash and wound.  Neurological: Negative.  Negative for dizziness, tremors, seizures, syncope, speech  difficulty, weakness, light-headedness, numbness and headaches.  Hematological: Negative for adenopathy. Does not bruise/bleed easily.  Psychiatric/Behavioral: Positive for dysphoric mood. Negative for agitation, behavioral problems, confusion, hallucinations, self-injury, sleep disturbance and suicidal ideas. The patient is nervous/anxious.        Objective:   Physical Exam  Constitutional: She is oriented to person, place, and time. She appears well-developed and well-nourished. No distress.  HENT:  Head: Normocephalic and atraumatic.  Right Ear: External ear normal.  Left Ear: External ear normal.  Nose: Nose normal.  Mouth/Throat: Oropharynx is clear and moist. No oropharyngeal exudate.  Eyes: Pupils are equal, round, and reactive to light. Conjunctivae and EOM are normal. No scleral icterus.  Neck: Normal range of motion. Neck supple. No JVD present. No thyromegaly present.  Cardiovascular: Normal rate, regular rhythm, normal heart sounds and intact distal pulses. Exam reveals no gallop and no friction rub.  No murmur heard. Pulmonary/Chest: Effort normal and breath sounds normal. No respiratory distress. She has no wheezes. She has no rales. She exhibits no tenderness.  Abdominal: Soft. Bowel sounds are normal. She exhibits no distension and no mass. There is no tenderness. There is no rebound and no guarding.  Musculoskeletal: Normal range of motion. She exhibits no edema or tenderness.  Lymphadenopathy:    She has no cervical adenopathy.  Neurological: She is alert and oriented to person, place, and time. She has normal reflexes. She displays normal reflexes. No cranial nerve deficit. She exhibits normal muscle tone. Coordination normal.  Skin: Skin is warm and dry. No rash noted. No erythema.  Psychiatric: She has a normal mood and affect. Her behavior is normal. Judgment and thought content normal.  Assessment & Plan:  Her GERD is stable. For the IBS, I suggested  she try a probiotic like Align daily. For the depression I reminded her that Lexapro cannot help unless she takes it every day, so she said she would from now on. Get fasting labs to check her lipids, etc.  Alysia Penna, MD

## 2018-07-12 ENCOUNTER — Other Ambulatory Visit: Payer: Self-pay | Admitting: Family Medicine

## 2018-07-12 DIAGNOSIS — Z1231 Encounter for screening mammogram for malignant neoplasm of breast: Secondary | ICD-10-CM

## 2018-07-18 ENCOUNTER — Other Ambulatory Visit: Payer: Self-pay | Admitting: Family Medicine

## 2018-08-16 ENCOUNTER — Ambulatory Visit
Admission: RE | Admit: 2018-08-16 | Discharge: 2018-08-16 | Disposition: A | Discharge status: Home / Self Care | Payer: Medicare Other | Source: Ambulatory Visit | Attending: Family Medicine | Admitting: Family Medicine

## 2018-08-16 DIAGNOSIS — Z1231 Encounter for screening mammogram for malignant neoplasm of breast: Secondary | ICD-10-CM | POA: Diagnosis not present

## 2018-08-28 DIAGNOSIS — H04123 Dry eye syndrome of bilateral lacrimal glands: Secondary | ICD-10-CM | POA: Diagnosis not present

## 2018-08-28 DIAGNOSIS — H5203 Hypermetropia, bilateral: Secondary | ICD-10-CM | POA: Diagnosis not present

## 2018-08-28 DIAGNOSIS — H52222 Regular astigmatism, left eye: Secondary | ICD-10-CM | POA: Diagnosis not present

## 2018-09-07 ENCOUNTER — Other Ambulatory Visit: Payer: Self-pay | Admitting: Family Medicine

## 2018-10-24 ENCOUNTER — Other Ambulatory Visit: Payer: Self-pay | Admitting: Family Medicine

## 2018-11-01 NOTE — Telephone Encounter (Signed)
Call in #[phone] with 3 rf  

## 2018-11-01 NOTE — Telephone Encounter (Signed)
Dr. Sarajane Jews please advise of refill for this pt.  Thanks

## 2018-11-09 ENCOUNTER — Ambulatory Visit (INDEPENDENT_AMBULATORY_CARE_PROVIDER_SITE_OTHER): Payer: Medicare Other | Admitting: Family Medicine

## 2018-11-09 ENCOUNTER — Ambulatory Visit: Payer: Self-pay | Admitting: *Deleted

## 2018-11-09 ENCOUNTER — Encounter: Payer: Self-pay | Admitting: Family Medicine

## 2018-11-09 VITALS — BP 122/80 | HR 87 | Temp 98.6°F | Ht 64.0 in | Wt 197.8 lb

## 2018-11-09 DIAGNOSIS — R21 Rash and other nonspecific skin eruption: Secondary | ICD-10-CM | POA: Diagnosis not present

## 2018-11-09 MED ORDER — PREDNISONE 20 MG PO TABS: ORAL_TABLET | ORAL | 0 refills | Status: DC

## 2018-11-09 MED ORDER — TRIAMCINOLONE ACETONIDE 0.1 % EX CREA: 1.0000 "application " | TOPICAL_CREAM | Freq: Two times a day (BID) | CUTANEOUS | 0 refills | Status: DC

## 2018-11-09 NOTE — Telephone Encounter (Signed)
Noted  

## 2018-11-09 NOTE — Progress Notes (Signed)
HPI:  Using dictation device. Unfortunately this device frequently misinterprets words/phrases.  Acute visit for skin rash: -started 2 years ago, intermittent -itchy rash on flanks bilat -has been scratching it a lot -this flare started in last couple weeks -trying benadryl and hct cr and helps a little -in the past only oral prednisone works -colder weather seems to trigger -has dermatologist, but did not follow up -no SOB, swelling of face/throat/neck, fevers, malaise, pets -has also seen allergies and had testing, has epipen  ROS: See pertinent positives and negatives per HPI.  Past Medical History:  Diagnosis Date  . Colon polyps 09-25-2007   Colonoscopy  . Depression   . Diverticulosis of colon (without mention of hemorrhage) 09-25-2007   Colonoscopy  . GERD (gastroesophageal reflux disease)   . Gynecological examination    sees Dr. Carren Rang   . Hyperlipidemia     Past Surgical History:  Procedure Laterality Date  . COLONOSCOPY  12/30/2015   per Dr. Henrene Pastor, benign  polyps, repeat in 5  yrs.   . ESOPHAGOGASTRODUODENOSCOPY  06-30-12   per Dr. Sharlett Iles, reflux but no Barretts seen     Family History  Problem Relation Age of Onset  . Breast cancer Maternal Grandmother   . Crohn's disease Father   . Colon polyps Father   . Stroke Paternal Grandfather     SOCIAL HX: see hpi   Current Outpatient Medications:  .  aspirin EC 81 MG tablet, Take 81 mg by mouth daily., Disp: , Rfl:  .  aspirin-acetaminophen-caffeine (EXCEDRIN MIGRAINE) 250-250-65 MG tablet, Take 1 tablet by mouth every 6 (six) hours as needed for headache., Disp: , Rfl:  .  buPROPion (WELLBUTRIN XL) 300 MG 24 hr tablet, TAKE 1 TABLET BY MOUTH EVERY MORNING, Disp: 90 tablet, Rfl: 3 .  cetirizine (ZYRTEC) 10 MG tablet, Take 1 tablet (10 mg total) by mouth daily., Disp: 90 tablet, Rfl: 3 .  escitalopram (LEXAPRO) 20 MG tablet, TAKE 1 TABLET(20 MG) BY MOUTH DAILY, Disp: 90 tablet, Rfl: 0 .  hyoscyamine  (LEVSIN SL) 0.125 MG SL tablet, Place 1 tablet (0.125 mg total) under the tongue every 4 (four) hours as needed., Disp: 60 tablet, Rfl: 5 .  nicotine (NICODERM CQ - DOSED IN MG/24 HOURS) 14 mg/24hr patch, Place 1 patch (14 mg total) onto the skin daily., Disp: 28 patch, Rfl: 5 .  omeprazole (PRILOSEC) 40 MG capsule, Take 1 capsule (40 mg total) by mouth daily., Disp: 90 capsule, Rfl: 3 .  predniSONE (DELTASONE) 20 MG tablet, 2 tablets (40mg ) daily for 2 days, then 20mg  (1 tab) daily for 4 days, Disp: 8 tablet, Rfl: 0 .  triamcinolone cream (KENALOG) 0.1 %, Apply 1 application topically 2 (two) times daily., Disp: 30 g, Rfl: 0  EXAM:  Vitals:   11/09/18 1309  BP: 122/80  Pulse: 87  Temp: 98.6 F (37 C)    Body mass index is 33.95 kg/m.  GENERAL: vitals reviewed and listed above, alert, oriented, appears well hydrated and in no acute distress  HEENT: atraumatic, conjunttiva clear, no obvious abnormalities on inspection of external nose and ears  NECK: no obvious masses on inspection  SKIN: erythematous excoriated plaques on bilat flanks near pants lin and L L bra line  MS: moves all extremities without noticeable abnormality  PSYCH: pleasant and cooperative, no obvious depression or anxiety  ASSESSMENT AND PLAN:  Discussed the following assessment and plan:  Skin rash  -we discussed possible serious and likely etiologies, workup and treatment, treatment  risks and return precautions -after this discussion, Krithika opted for treatment per pt instructions/orders and follow up with dermatology -follow up advised 1-2 months with PCP -of course, we advised Marta  to return or notify a doctor immediately if symptoms worsen or persist or new concerns arise.   Patient Instructions  BEFORE YOU LEAVE: -follow up: w/ PCP in 1-2 months  Call to schedule follow up with your dermatologist  Topical steroid cream 2x daily for 1-2 weeks  Aquaphor after shower  Take short and lukewarm  shower and use gentle hypoallergenic soap  Use only hypoallergenic soap, lotion, detergents  Wear cool, cotton or natural fiber loose fitting clothing if possible  Prednisone if needed if not improving  Zyrtec once daily. Stop benadryl and any other antihistamine/allergy pills.  I hope you are feeling better soon! Seek care promptly if your symptoms worsen, new concerns arise or you are not improving with treatment.        Lucretia Kern, DO

## 2018-11-09 NOTE — Patient Instructions (Signed)
BEFORE YOU LEAVE: -follow up: w/ PCP in 1-2 months  Call to schedule follow up with your dermatologist  Topical steroid cream 2x daily for 1-2 weeks  Aquaphor after shower  Take short and lukewarm shower and use gentle hypoallergenic soap  Use only hypoallergenic soap, lotion, detergents  Wear cool, cotton or natural fiber loose fitting clothing if possible  Prednisone if needed if not improving  Zyrtec once daily. Stop benadryl and any other antihistamine/allergy pills.  I hope you are feeling better soon! Seek care promptly if your symptoms worsen, new concerns arise or you are not improving with treatment.

## 2018-11-09 NOTE — Telephone Encounter (Signed)
Summary: rash    Pt called and stated that she has a rash on both sides of waist and back side. Also under breast. Pt would like a call back regarding      Patient is having a flare of a rash she has had in the past- she has been tested but the provider has never seen the flare- she has not been able to make it go away with over the counter treatments and it is beginning to be a problem. Reason for Disposition . SEVERE itching (i.e., interferes with sleep, normal activities or school)  Answer Assessment - Initial Assessment Questions 1. APPEARANCE of RASH: "Describe the rash." (e.g., spots, blisters, raised areas, skin peeling, scaly)     Red splotches- raises after scratches and looks likes whleps 2. SIZE: "How big are the spots?" (e.g., tip of pen, eraser, coin; inches, centimeters)     2 inches long- the size of orange or apple 3. LOCATION: "Where is the rash located?"     Waist, buttocks,under L breast 4. COLOR: "What color is the rash?" (Note: It is difficult to assess rash color in people with darker-colored skin. When this situation occurs, simply ask the caller to describe what they see.)     Red- almost like diaper rash 5. ONSET: "When did the rash begin?"     Only comes at certain times- started 2 weeks ago 6. FEVER: "Do you have a fever?" If so, ask: "What is your temperature, how was it measured, and when did it start?"     no 7. ITCHING: "Does the rash itch?" If so, ask: "How bad is the itch?" (Scale 1-10; or mild, moderate, severe)     Yes- severe 8. CAUSE: "What do you think is causing the rash?"     Possible stress 9. MEDICATION FACTORS: "Have you started any new medications within the last 2 weeks?" (e.g., antibiotics)      No- change in generic Wellbutrin 10. OTHER SYMPTOMS: "Do you have any other symptoms?" (e.g., dizziness, headache, sore throat, joint pain)       no 11. PREGNANCY: "Is there any chance you are pregnant?" "When was your last menstrual period?"  n/a  Protocols used: RASH OR REDNESS - [facility]

## 2018-11-17 ENCOUNTER — Ambulatory Visit: Payer: Self-pay

## 2018-11-17 NOTE — Telephone Encounter (Signed)
Pt. Reports she was seen this month with "the same rash.It got a little better and then went back to what it was.I have a swollen are to my left inner arm where the rash is.I have had a lot of problems with this." Requesting another round of Prednisone be sent to her pharmacy. Denies any other symptoms other than itching. Please advise pt. Answer Assessment - Initial Assessment Questions 1. APPEARANCE of RASH: "Describe the rash." (e.g., spots, blisters, raised areas, skin peeling, scaly)     Whelps and small bumps 2. SIZE: "How big are the spots?" (e.g., tip of pen, eraser, coin; inches, centimeters)     Pin hole size 3. LOCATION: "Where is the rash located?"     Left arm, left breast and on her side and left buttock 4. COLOR: "What color is the rash?" (Note: It is difficult to assess rash color in people with darker-colored skin. When this situation occurs, simply ask the caller to describe what they see.)     Red 5. ONSET: "When did the rash begin?"     Started 3-4 weeks ago 6. FEVER: "Do you have a fever?" If so, ask: "What is your temperature, how was it measured, and when did it start?"     No 7. ITCHING: "Does the rash itch?" If so, ask: "How bad is the itch?" (Scale 1-10; or mild, moderate, severe)     9 8. CAUSE: "What do you think is causing the rash?"     uNSURE 9. MEDICATION FACTORS: "Have you started any new medications within the last 2 weeks?" (e.g., antibiotics)      Has been on Prednisone 10. OTHER SYMPTOMS: "Do you have any other symptoms?" (e.g., dizziness, headache, sore throat, joint pain)       No 11. PREGNANCY: "Is there any chance you are pregnant?" "When was your last menstrual period?"       N/A  Protocols used: RASH OR REDNESS - [facility]

## 2018-11-17 NOTE — Telephone Encounter (Signed)
Call in another Medrol dose pack. It this does not take care of the rash, I think she should see Dermatology

## 2018-11-17 NOTE — Telephone Encounter (Signed)
Dr Fry please advise. thanks 

## 2018-11-20 ENCOUNTER — Telehealth: Payer: Self-pay | Admitting: Family Medicine

## 2018-11-20 MED ORDER — PREDNISONE 20 MG PO TABS: ORAL_TABLET | ORAL | 0 refills | Status: DC

## 2018-11-20 NOTE — Telephone Encounter (Signed)
Copied from [place] [phone]. Topic: Quick Communication - See Telephone Encounter >> Nov 20, 2018  9:44 AM Ahmed Prima L wrote: CRM for notification. See Telephone encounter for: 11/20/18. Please See Nurse Triage encounter from Friday, December 13th. Patient is checking to see if the " medrol " is still going to be called in because she is miserable. Please Advise.

## 2018-11-20 NOTE — Telephone Encounter (Signed)
Called and spoke with pt and verified her pharmacy.   Prednisone has been sent to the pharmacy.  Nothing further is needed.

## 2018-12-02 ENCOUNTER — Other Ambulatory Visit: Payer: Self-pay | Admitting: Family Medicine

## 2018-12-07 DIAGNOSIS — L309 Dermatitis, unspecified: Secondary | ICD-10-CM | POA: Diagnosis not present

## 2018-12-07 DIAGNOSIS — Z23 Encounter for immunization: Secondary | ICD-10-CM | POA: Diagnosis not present

## 2018-12-20 DIAGNOSIS — R21 Rash and other nonspecific skin eruption: Secondary | ICD-10-CM | POA: Diagnosis not present

## 2019-01-16 DIAGNOSIS — L308 Other specified dermatitis: Secondary | ICD-10-CM | POA: Diagnosis not present

## 2019-02-21 ENCOUNTER — Other Ambulatory Visit: Payer: Self-pay | Admitting: Family Medicine

## 2019-04-17 DIAGNOSIS — T7840XA Allergy, unspecified, initial encounter: Secondary | ICD-10-CM | POA: Diagnosis not present

## 2019-04-17 DIAGNOSIS — L239 Allergic contact dermatitis, unspecified cause: Secondary | ICD-10-CM | POA: Diagnosis not present

## 2019-05-08 ENCOUNTER — Other Ambulatory Visit: Payer: Self-pay | Admitting: Family Medicine

## 2019-06-02 ENCOUNTER — Other Ambulatory Visit: Payer: Self-pay | Admitting: Family Medicine

## 2019-06-19 ENCOUNTER — Ambulatory Visit (INDEPENDENT_AMBULATORY_CARE_PROVIDER_SITE_OTHER): Payer: Medicare Other | Admitting: Family Medicine

## 2019-06-19 ENCOUNTER — Encounter: Payer: Self-pay | Admitting: Family Medicine

## 2019-06-19 ENCOUNTER — Other Ambulatory Visit: Payer: Self-pay

## 2019-06-19 VITALS — BP 140/90 | HR 81 | Temp 98.5°F | Ht 64.0 in | Wt 194.4 lb

## 2019-06-19 DIAGNOSIS — K581 Irritable bowel syndrome with constipation: Secondary | ICD-10-CM

## 2019-06-19 DIAGNOSIS — F324 Major depressive disorder, single episode, in partial remission: Secondary | ICD-10-CM | POA: Diagnosis not present

## 2019-06-19 DIAGNOSIS — K219 Gastro-esophageal reflux disease without esophagitis: Secondary | ICD-10-CM

## 2019-06-19 DIAGNOSIS — E785 Hyperlipidemia, unspecified: Secondary | ICD-10-CM | POA: Diagnosis not present

## 2019-06-19 LAB — BASIC METABOLIC PANEL
BUN: 14 mg/dL (ref 6–23)
CO2: 26 mEq/L (ref 19–32)
Calcium: 9.5 mg/dL (ref 8.4–10.5)
Chloride: 102 mEq/L (ref 96–112)
Creatinine, Ser: 0.91 mg/dL (ref 0.40–1.20)
GFR: 60.95 mL/min (ref >60.00)
Glucose, Bld: 95 mg/dL (ref 70–99)
Potassium: 4.2 mEq/L (ref 3.5–5.1)
Sodium: 137 mEq/L (ref 135–145)

## 2019-06-19 LAB — CBC WITH DIFFERENTIAL/PLATELET
Basophils Absolute: 0 10*3/uL (ref 0.0–0.1)
Basophils Relative: 0.5 % (ref 0.0–3.0)
Eosinophils Absolute: 0.3 10*3/uL (ref 0.0–0.7)
Eosinophils Relative: 3.3 % (ref 0.0–5.0)
HCT: 43.8 % (ref 36.0–46.0)
Hemoglobin: 14.5 g/dL (ref 12.0–15.0)
Lymphocytes Relative: 31.5 % (ref 12.0–46.0)
Lymphs Abs: 2.4 10*3/uL (ref 0.7–4.0)
MCHC: 33.1 g/dL (ref 30.0–36.0)
MCV: 93.1 fl (ref 78.0–100.0)
Monocytes Absolute: 0.5 10*3/uL (ref 0.1–1.0)
Monocytes Relative: 6.9 % (ref 3.0–12.0)
Neutro Abs: 4.5 10*3/uL (ref 1.4–7.7)
Neutrophils Relative %: 57.8 % (ref 43.0–77.0)
Platelets: 284 10*3/uL (ref 150.0–400.0)
RBC: 4.7 Mil/uL (ref 3.87–5.11)
RDW: 14.3 % (ref 11.5–15.5)
WBC: 7.8 10*3/uL (ref 4.0–10.5)

## 2019-06-19 LAB — POC URINALSYSI DIPSTICK (AUTOMATED)
Bilirubin, UA: NEGATIVE
Glucose, UA: NEGATIVE
Ketones, UA: NEGATIVE
Leukocytes, UA: NEGATIVE
Nitrite, UA: NEGATIVE
Protein, UA: NEGATIVE
Spec Grav, UA: 1.015 (ref 1.010–1.025)
Urobilinogen, UA: 0.2 E.U./dL
pH, UA: 6 (ref 5.0–8.0)

## 2019-06-19 LAB — TSH: TSH: 2.52 u[IU]/mL (ref 0.35–4.50)

## 2019-06-19 LAB — HEPATIC FUNCTION PANEL
ALT: 6 U/L (ref 0–35)
AST: 8 U/L (ref 0–37)
Albumin: 4.4 g/dL (ref 3.5–5.2)
Alkaline Phosphatase: 91 U/L (ref 39–117)
Bilirubin, Direct: 0.1 mg/dL (ref 0.0–0.3)
Total Bilirubin: 0.6 mg/dL (ref 0.2–1.2)
Total Protein: 6.4 g/dL (ref 6.0–8.3)

## 2019-06-19 LAB — LIPID PANEL
Cholesterol: 242 mg/dL — ABNORMAL HIGH (ref 0–200)
HDL: 45.5 mg/dL (ref >39.00)
LDL Cholesterol: 157 mg/dL — ABNORMAL HIGH (ref 0–99)
NonHDL: 196.38
Total CHOL/HDL Ratio: 5
Triglycerides: 196 mg/dL — ABNORMAL HIGH (ref 0.0–149.0)
VLDL: 39.2 mg/dL (ref 0.0–40.0)

## 2019-06-19 MED ORDER — VENLAFAXINE HCL ER 75 MG PO CP24: 75.0000 mg | ORAL_CAPSULE | Freq: Every day | ORAL | 5 refills | Status: DC

## 2019-06-19 MED ORDER — HYOSCYAMINE SULFATE 0.125 MG SL SUBL: 0.1250 mg | SUBLINGUAL_TABLET | SUBLINGUAL | 11 refills | Status: DC | PRN

## 2019-06-19 MED ORDER — TRIAMCINOLONE ACETONIDE 0.1 % EX CREA: 1.0000 "application " | TOPICAL_CREAM | Freq: Two times a day (BID) | CUTANEOUS | 3 refills | Status: DC

## 2019-06-19 NOTE — Progress Notes (Signed)
Subjective:    Patient ID: Emily Horton, female    DOB: 09-07-1946, 73 y.o.   MRN: [id number]  HPI Here to follow up on issues. She continues to deal with dermatitis and itching, although this comes and goes. She mentions constipation is a problem, though she drinks plenty of water. Her main complaint today is depression. She has eben struggling with ths since her husband died, and this past year has been difficult. She is still taking Wellbutrin but she stopped taking Lexapro about 6 months ago due to financial concerns. Now she is at a better place financially. She has trouble with sleep but appetite is preserved. She admits to some thoughts of suicide but she denies any plans or intent.    Review of Systems  Constitutional: Negative.   HENT: Negative.   Eyes: Negative.   Respiratory: Negative.   Cardiovascular: Negative.   Gastrointestinal: Negative.   Genitourinary: Negative for decreased urine volume, difficulty urinating, dyspareunia, dysuria, enuresis, flank pain, frequency, hematuria, pelvic pain and urgency.  Musculoskeletal: Negative.   Skin: Positive for rash.  Neurological: Negative.   Psychiatric/Behavioral: Positive for decreased concentration, dysphoric mood, sleep disturbance and suicidal ideas. Negative for agitation, behavioral problems, confusion, hallucinations and self-injury. The patient is nervous/anxious.        Objective:   Physical Exam Constitutional:      General: She is not in acute distress.    Appearance: She is well-developed.  HENT:     Head: Normocephalic and atraumatic.     Right Ear: External ear normal.     Left Ear: External ear normal.     Nose: Nose normal.     Mouth/Throat:     Pharynx: No oropharyngeal exudate.  Eyes:     General: No scleral icterus.    Conjunctiva/sclera: Conjunctivae normal.     Pupils: Pupils are equal, round, and reactive to light.  Neck:     Musculoskeletal: Normal range of motion and neck supple.     Thyroid: No  thyromegaly.     Vascular: No JVD.  Cardiovascular:     Rate and Rhythm: Normal rate and regular rhythm.     Heart sounds: Normal heart sounds. No murmur. No friction rub. No gallop.   Pulmonary:     Effort: Pulmonary effort is normal. No respiratory distress.     Breath sounds: Normal breath sounds. No wheezing or rales.  Chest:     Chest wall: No tenderness.  Abdominal:     General: Bowel sounds are normal. There is no distension.     Palpations: Abdomen is soft. There is no mass.     Tenderness: There is no abdominal tenderness. There is no guarding or rebound.  Musculoskeletal: Normal range of motion.        General: No tenderness.  Lymphadenopathy:     Cervical: No cervical adenopathy.  Skin:    General: Skin is warm and dry.     Findings: No erythema or rash.  Neurological:     Mental Status: She is alert and oriented to person, place, and time.     Cranial Nerves: No cranial nerve deficit.     Motor: No abnormal muscle tone.     Coordination: Coordination normal.     Deep Tendon Reflexes: Reflexes are normal and symmetric. Reflexes normal.  Psychiatric:        Behavior: Behavior normal.        Thought Content: Thought content normal.  Judgment: Judgment normal.     Comments: Affect is depressed and she is tearful            Assessment & Plan:  She is dealing with depression, and no doubt getting off Lexapro has exacerbated this. She will stay on Wellbutrin but we will add Effexor XR 75 mg daily. I encouraged her to talk with a therapist as well. She will report back to [place] in 3 weeks with how she is feeling. For the constipation she will try Miralax daily. Get fasting labs to check lipids, etc.  Alysia Penna, MD

## 2019-06-21 ENCOUNTER — Emergency Department (HOSPITAL_COMMUNITY): Payer: Medicare Other

## 2019-06-21 ENCOUNTER — Other Ambulatory Visit: Payer: Self-pay

## 2019-06-21 ENCOUNTER — Inpatient Hospital Stay (HOSPITAL_COMMUNITY)
Admission: EM | Admit: 2019-06-21 | Discharge: 2019-06-26 | DRG: 481 | Disposition: A | Discharge status: Skilled Nursing Facility | Payer: Medicare Other | Attending: Internal Medicine | Admitting: Internal Medicine

## 2019-06-21 ENCOUNTER — Encounter (HOSPITAL_COMMUNITY): Payer: Self-pay

## 2019-06-21 DIAGNOSIS — Z91012 Allergy to eggs: Secondary | ICD-10-CM

## 2019-06-21 DIAGNOSIS — D62 Acute posthemorrhagic anemia: Secondary | ICD-10-CM | POA: Diagnosis not present

## 2019-06-21 DIAGNOSIS — S72141A Displaced intertrochanteric fracture of right femur, initial encounter for closed fracture: Secondary | ICD-10-CM | POA: Diagnosis not present

## 2019-06-21 DIAGNOSIS — E785 Hyperlipidemia, unspecified: Secondary | ICD-10-CM | POA: Diagnosis present

## 2019-06-21 DIAGNOSIS — F329 Major depressive disorder, single episode, unspecified: Secondary | ICD-10-CM | POA: Diagnosis not present

## 2019-06-21 DIAGNOSIS — Z888 Allergy status to other drugs, medicaments and biological substances status: Secondary | ICD-10-CM | POA: Diagnosis not present

## 2019-06-21 DIAGNOSIS — R001 Bradycardia, unspecified: Secondary | ICD-10-CM | POA: Diagnosis not present

## 2019-06-21 DIAGNOSIS — I10 Essential (primary) hypertension: Secondary | ICD-10-CM | POA: Diagnosis not present

## 2019-06-21 DIAGNOSIS — Z1159 Encounter for screening for other viral diseases: Secondary | ICD-10-CM | POA: Diagnosis not present

## 2019-06-21 DIAGNOSIS — S299XXA Unspecified injury of thorax, initial encounter: Secondary | ICD-10-CM | POA: Diagnosis not present

## 2019-06-21 DIAGNOSIS — Z419 Encounter for procedure for purposes other than remedying health state, unspecified: Secondary | ICD-10-CM

## 2019-06-21 DIAGNOSIS — Z7982 Long term (current) use of aspirin: Secondary | ICD-10-CM

## 2019-06-21 DIAGNOSIS — Z79899 Other long term (current) drug therapy: Secondary | ICD-10-CM

## 2019-06-21 DIAGNOSIS — W010XXA Fall on same level from slipping, tripping and stumbling without subsequent striking against object, initial encounter: Secondary | ICD-10-CM | POA: Diagnosis present

## 2019-06-21 DIAGNOSIS — S72001A Fracture of unspecified part of neck of right femur, initial encounter for closed fracture: Secondary | ICD-10-CM

## 2019-06-21 DIAGNOSIS — F339 Major depressive disorder, recurrent, unspecified: Secondary | ICD-10-CM | POA: Diagnosis present

## 2019-06-21 DIAGNOSIS — Z86010 Personal history of colonic polyps: Secondary | ICD-10-CM

## 2019-06-21 DIAGNOSIS — F17210 Nicotine dependence, cigarettes, uncomplicated: Secondary | ICD-10-CM | POA: Diagnosis present

## 2019-06-21 DIAGNOSIS — Z823 Family history of stroke: Secondary | ICD-10-CM

## 2019-06-21 DIAGNOSIS — Y9301 Activity, walking, marching and hiking: Secondary | ICD-10-CM | POA: Diagnosis present

## 2019-06-21 DIAGNOSIS — R0902 Hypoxemia: Secondary | ICD-10-CM | POA: Diagnosis not present

## 2019-06-21 DIAGNOSIS — K219 Gastro-esophageal reflux disease without esophagitis: Secondary | ICD-10-CM | POA: Diagnosis not present

## 2019-06-21 DIAGNOSIS — Z03818 Encounter for observation for suspected exposure to other biological agents ruled out: Secondary | ICD-10-CM | POA: Diagnosis not present

## 2019-06-21 DIAGNOSIS — E8889 Other specified metabolic disorders: Secondary | ICD-10-CM | POA: Diagnosis present

## 2019-06-21 DIAGNOSIS — Z8371 Family history of colonic polyps: Secondary | ICD-10-CM

## 2019-06-21 DIAGNOSIS — W19XXXA Unspecified fall, initial encounter: Secondary | ICD-10-CM | POA: Diagnosis not present

## 2019-06-21 DIAGNOSIS — Z66 Do not resuscitate: Secondary | ICD-10-CM | POA: Diagnosis present

## 2019-06-21 DIAGNOSIS — Y92009 Unspecified place in unspecified non-institutional (private) residence as the place of occurrence of the external cause: Secondary | ICD-10-CM

## 2019-06-21 DIAGNOSIS — S72009A Fracture of unspecified part of neck of unspecified femur, initial encounter for closed fracture: Secondary | ICD-10-CM

## 2019-06-21 DIAGNOSIS — Z803 Family history of malignant neoplasm of breast: Secondary | ICD-10-CM

## 2019-06-21 LAB — BASIC METABOLIC PANEL
Anion gap: 9 (ref 5–15)
BUN: 15 mg/dL (ref 8–23)
CO2: 23 mmol/L (ref 22–32)
Calcium: 9.3 mg/dL (ref 8.9–10.3)
Chloride: 100 mmol/L (ref 98–111)
Creatinine, Ser: 0.95 mg/dL (ref 0.44–1.00)
GFR calc Af Amer: >60 mL/min (ref >60)
GFR calc non Af Amer: >60 mL/min (ref >60)
Glucose, Bld: 103 mg/dL — ABNORMAL HIGH (ref 70–99)
Potassium: 4.3 mmol/L (ref 3.5–5.1)
Sodium: 132 mmol/L — ABNORMAL LOW (ref 135–145)

## 2019-06-21 LAB — CBC
HCT: 43.2 % (ref 36.0–46.0)
Hemoglobin: 14.3 g/dL (ref 12.0–15.0)
MCH: 30.8 pg (ref 26.0–34.0)
MCHC: 33.1 g/dL (ref 30.0–36.0)
MCV: 93.1 fL (ref 80.0–100.0)
Platelets: 304 10*3/uL (ref 150–400)
RBC: 4.64 MIL/uL (ref 3.87–5.11)
RDW: 13.2 % (ref 11.5–15.5)
WBC: 19.7 10*3/uL — ABNORMAL HIGH (ref 4.0–10.5)
nRBC: 0 % (ref 0.0–0.2)

## 2019-06-21 LAB — TYPE AND SCREEN
ABO/RH(D): O POS
Antibody Screen: NEGATIVE

## 2019-06-21 MED ORDER — HYDROMORPHONE HCL 1 MG/ML IJ SOLN
1.0000 mg | Freq: Once | INTRAMUSCULAR | Status: AC
  Administered 2019-06-21: 1 mg via INTRAVENOUS
  Filled 2019-06-21: qty 1

## 2019-06-21 NOTE — ED Provider Notes (Signed)
[facility] EMERGENCY DEPARTMENT Provider Note   CSN: [id number] Arrival date & time: 06/21/19  2220    History   Chief Complaint Chief Complaint  Patient presents with  . Fall  . Hip Pain    HPI Emily Horton is a 73 y.o. female.     Patient presents to the emergency department for evaluation of right hip pain after a fall.  Patient was walking at her home earlier and tripped over her sandal.  She reports that she fell initially hitting her right elbow and then landed onto her backside.  She has been having pain in the right groin and hip area that worsens with movement.  She was unable to get herself up, ultimately called out and her neighbors came and found her, called EMS.     Past Medical History:  Diagnosis Date  . Colon polyps 09-25-2007   Colonoscopy  . Depression   . Diverticulosis of colon (without mention of hemorrhage) 09-25-2007   Colonoscopy  . GERD (gastroesophageal reflux disease)   . Gynecological examination    sees Dr. Carren Rang   . Hyperlipidemia     Patient Active Problem List   Diagnosis Date Noted  . Environmental and seasonal allergies 06/12/2018  . Dyslipidemia 08/26/2017  . IBS (irritable bowel syndrome) 08/26/2017  . Angio-edema 11/03/2016  . Diverticulosis of colon without hemorrhage 09/30/2015  . Depression 08/22/2007  . GERD 07/04/2007    Past Surgical History:  Procedure Laterality Date  . COLONOSCOPY  12/30/2015   per Dr. Henrene Pastor, benign  polyps, repeat in 5  yrs.   . ESOPHAGOGASTRODUODENOSCOPY  06-30-12   per Dr. Sharlett Iles, reflux but no Barretts seen      OB History   No obstetric history on file.      Home Medications    Prior to Admission medications   Medication Sig Start Date End Date Taking? Authorizing Provider  aspirin EC 81 MG tablet Take 81 mg by mouth daily.    [provider]  aspirin-acetaminophen-caffeine (EXCEDRIN MIGRAINE) [id number] MG tablet Take 1 tablet by mouth every 6 (six)  hours as needed for headache.    [provider]  buPROPion (WELLBUTRIN XL) 300 MG 24 hr tablet TAKE 1 TABLET BY MOUTH EVERY MORNING 11/01/18   Laurey Morale, MD  cetirizine (ZYRTEC) 10 MG tablet TAKE 1 TABLET(10 MG) BY MOUTH DAILY 06/05/19   Laurey Morale, MD  hyoscyamine (LEVSIN SL) 0.125 MG SL tablet Place 1 tablet (0.125 mg total) under the tongue every 4 (four) hours as needed. 06/19/19   Laurey Morale, MD  omeprazole (PRILOSEC) 40 MG capsule TAKE 1 CAPSULE BY MOUTH TWICE DAILY 05/15/19   Laurey Morale, MD  triamcinolone cream (KENALOG) 0.1 % Apply 1 application topically 2 (two) times daily. 06/19/19   Laurey Morale, MD  venlafaxine XR (EFFEXOR XR) 75 MG 24 hr capsule Take 1 capsule (75 mg total) by mouth daily with breakfast. 06/19/19   Laurey Morale, MD    Family History Family History  Problem Relation Age of Onset  . Breast cancer Maternal Grandmother   . Crohn's disease Father   . Colon polyps Father   . Stroke Paternal Grandfather     Social History Social History   Tobacco Use  . Smoking status: Current Every Day Smoker    Packs/day: 0.50    Years: 30.00    Pack years: 15.00    Types: Cigarettes  . Smokeless tobacco: Never Used  .  Tobacco comment: or less  Substance Use Topics  . Alcohol use: Yes    Alcohol/week: 0.0 standard drinks    Comment: occasional  . Drug use: No     Allergies   Eggs or egg-derived products and Zoloft [sertraline hcl]   Review of Systems Review of Systems  Musculoskeletal: Positive for arthralgias.  All other systems reviewed and are negative.    Physical Exam Updated Vital Signs BP 135/86 (BP Location: Right Arm)   Pulse 76   Temp 98.5 F (36.9 C) (Oral)   Resp 19   Ht 5\' 4"  (1.626 m)   Wt 87.1 kg   SpO2 99%   BMI 32.96 kg/m   Physical Exam Vitals signs and nursing note reviewed.  Constitutional:      General: She is not in acute distress.    Appearance: Normal appearance. She is well-developed.  HENT:      Head: Normocephalic and atraumatic.     Right Ear: Hearing normal.     Left Ear: Hearing normal.     Nose: Nose normal.  Eyes:     Conjunctiva/sclera: Conjunctivae normal.     Pupils: Pupils are equal, round, and reactive to light.  Neck:     Musculoskeletal: Normal range of motion and neck supple.  Cardiovascular:     Rate and Rhythm: Regular rhythm.     Heart sounds: S1 normal and S2 normal. No murmur. No friction rub. No gallop.   Pulmonary:     Effort: Pulmonary effort is normal. No respiratory distress.     Breath sounds: Normal breath sounds.  Chest:     Chest wall: No tenderness.  Abdominal:     General: Bowel sounds are normal.     Palpations: Abdomen is soft.     Tenderness: There is no abdominal tenderness. There is no guarding or rebound. Negative signs include Murphy's sign and McBurney's sign.     Hernia: No hernia is present.  Musculoskeletal:     Right elbow: She exhibits normal range of motion, no swelling and no deformity.     Right hip: She exhibits decreased range of motion and tenderness. She exhibits no deformity.       Arms:  Skin:    General: Skin is warm and dry.     Findings: No rash.  Neurological:     Mental Status: She is alert and oriented to person, place, and time.     GCS: GCS eye subscore is 4. GCS verbal subscore is 5. GCS motor subscore is 6.     Cranial Nerves: No cranial nerve deficit.     Sensory: No sensory deficit.     Coordination: Coordination normal.  Psychiatric:        Speech: Speech normal.        Behavior: Behavior normal.        Thought Content: Thought content normal.      ED Treatments / Results  Labs (all labs ordered are listed, but only abnormal results are displayed) Labs Reviewed  CBC - Abnormal; Notable for the following components:      Result Value   WBC 19.7 (*)    All other components within normal limits  BASIC METABOLIC PANEL - Abnormal; Notable for the following components:   Sodium 132 (*)     Glucose, Bld 103 (*)    All other components within normal limits  PROTIME-INR  TYPE AND SCREEN  ABO/RH    EKG None  Radiology Dg Chest 1 View  Result Date: 06/21/2019 CLINICAL DATA:  Fall, hip fracture EXAM: CHEST  1 VIEW COMPARISON:  07/06/2016 FINDINGS: Heart and mediastinal contours are within normal limits. No focal opacities or effusions. No acute bony abnormality. IMPRESSION: No active disease. Electronically Signed   By: Rolm Baptise M.D.   On: 06/21/2019 23:55   Dg Hip Unilat W Or Wo Pelvis 2-3 Views Right  Result Date: 06/21/2019 CLINICAL DATA:  Fall.  Right hip pain. EXAM: DG HIP (WITH OR WITHOUT PELVIS) 2-3V RIGHT COMPARISON:  None. FINDINGS: There is a proximal right femoral intertrochanteric fracture with varus angulation. No subluxation or dislocation. Joint spaces are maintained and symmetric. IMPRESSION: Right femoral intertrochanteric fracture with varus angulation. Electronically Signed   By: Rolm Baptise M.D.   On: 06/21/2019 23:54    Procedures Procedures (including critical care time)  Medications Ordered in ED Medications  HYDROmorphone (DILAUDID) injection 1 mg (1 mg Intravenous Given 06/21/19 2309)     Initial Impression / Assessment and Plan / ED Course  I have reviewed the triage vital signs and the nursing notes.  Pertinent labs & imaging results that were available during my care of the patient were reviewed by me and considered in my medical decision making (see chart for details).        Patient presents to the emergency department for evaluation after a fall.  Patient had a ground-level fall that occurred when she tripped over her flip-flop.  Patient complaining of right groin and hip pain.  No obvious deformity on initial examination but she did have significant pain with any minimal movement of the hip.  X-ray shows intertrochanteric fracture.  Patient will be admitted by the hospitalist.  Discussed with Dr. Doreatha Martin, on-call for orthopedics.   Plan for surgery later today.  Final Clinical Impressions(s) / ED Diagnoses   Final diagnoses:  Closed fracture of right hip, initial encounter [facility])    ED Discharge Orders    None       Clem Wisenbaker, Gwenyth Allegra, MD 06/22/19 0022

## 2019-06-21 NOTE — ED Provider Notes (Signed)
MSE was initiated and I personally evaluated the patient and placed orders (if any) at  11:02 PM on June 21, 2019.  The patient appears stable so that the remainder of the MSE may be completed by another provider.  Fall with right hip pain.  Medical screening exam performed.  Labs and imaging ordered.  Pain treated.   Jola Schmidt, MD 06/21/19 2302

## 2019-06-21 NOTE — ED Triage Notes (Signed)
Pt from home, tripped and fell in driveway, down about 20 mins before anyone found her. C.o right hip pain, no deformity noted, pulses palpable. No LOC. Pt arrives a.o  125mcg fentanyl given en route

## 2019-06-21 NOTE — ED Notes (Signed)
Pt to Xray.

## 2019-06-22 ENCOUNTER — Inpatient Hospital Stay (HOSPITAL_COMMUNITY): Payer: Medicare Other

## 2019-06-22 ENCOUNTER — Encounter (HOSPITAL_COMMUNITY): Payer: Self-pay | Admitting: Internal Medicine

## 2019-06-22 ENCOUNTER — Encounter (HOSPITAL_COMMUNITY)
Admission: EM | Disposition: A | Discharge status: Skilled Nursing Facility | Payer: Self-pay | Source: Home / Self Care | Attending: Internal Medicine

## 2019-06-22 ENCOUNTER — Inpatient Hospital Stay (HOSPITAL_COMMUNITY): Payer: Medicare Other | Admitting: Certified Registered"

## 2019-06-22 DIAGNOSIS — Z91012 Allergy to eggs: Secondary | ICD-10-CM | POA: Diagnosis not present

## 2019-06-22 DIAGNOSIS — J302 Other seasonal allergic rhinitis: Secondary | ICD-10-CM | POA: Diagnosis not present

## 2019-06-22 DIAGNOSIS — Z23 Encounter for immunization: Secondary | ICD-10-CM | POA: Diagnosis not present

## 2019-06-22 DIAGNOSIS — M6281 Muscle weakness (generalized): Secondary | ICD-10-CM | POA: Diagnosis not present

## 2019-06-22 DIAGNOSIS — D72829 Elevated white blood cell count, unspecified: Secondary | ICD-10-CM | POA: Diagnosis not present

## 2019-06-22 DIAGNOSIS — S72001D Fracture of unspecified part of neck of right femur, subsequent encounter for closed fracture with routine healing: Secondary | ICD-10-CM | POA: Diagnosis not present

## 2019-06-22 DIAGNOSIS — Z7982 Long term (current) use of aspirin: Secondary | ICD-10-CM | POA: Diagnosis not present

## 2019-06-22 DIAGNOSIS — Z823 Family history of stroke: Secondary | ICD-10-CM | POA: Diagnosis not present

## 2019-06-22 DIAGNOSIS — F329 Major depressive disorder, single episode, unspecified: Secondary | ICD-10-CM | POA: Diagnosis not present

## 2019-06-22 DIAGNOSIS — E559 Vitamin D deficiency, unspecified: Secondary | ICD-10-CM | POA: Diagnosis not present

## 2019-06-22 DIAGNOSIS — Y92009 Unspecified place in unspecified non-institutional (private) residence as the place of occurrence of the external cause: Secondary | ICD-10-CM | POA: Diagnosis not present

## 2019-06-22 DIAGNOSIS — W010XXA Fall on same level from slipping, tripping and stumbling without subsequent striking against object, initial encounter: Secondary | ICD-10-CM | POA: Diagnosis present

## 2019-06-22 DIAGNOSIS — M62838 Other muscle spasm: Secondary | ICD-10-CM | POA: Diagnosis not present

## 2019-06-22 DIAGNOSIS — R5381 Other malaise: Secondary | ICD-10-CM | POA: Diagnosis not present

## 2019-06-22 DIAGNOSIS — R2689 Other abnormalities of gait and mobility: Secondary | ICD-10-CM | POA: Diagnosis not present

## 2019-06-22 DIAGNOSIS — Z86010 Personal history of colonic polyps: Secondary | ICD-10-CM | POA: Diagnosis not present

## 2019-06-22 DIAGNOSIS — K219 Gastro-esophageal reflux disease without esophagitis: Secondary | ICD-10-CM | POA: Diagnosis not present

## 2019-06-22 DIAGNOSIS — Z7401 Bed confinement status: Secondary | ICD-10-CM | POA: Diagnosis not present

## 2019-06-22 DIAGNOSIS — Z1159 Encounter for screening for other viral diseases: Secondary | ICD-10-CM | POA: Diagnosis not present

## 2019-06-22 DIAGNOSIS — Z803 Family history of malignant neoplasm of breast: Secondary | ICD-10-CM | POA: Diagnosis not present

## 2019-06-22 DIAGNOSIS — R52 Pain, unspecified: Secondary | ICD-10-CM | POA: Diagnosis not present

## 2019-06-22 DIAGNOSIS — E785 Hyperlipidemia, unspecified: Secondary | ICD-10-CM | POA: Diagnosis not present

## 2019-06-22 DIAGNOSIS — Z0389 Encounter for observation for other suspected diseases and conditions ruled out: Secondary | ICD-10-CM | POA: Diagnosis not present

## 2019-06-22 DIAGNOSIS — B379 Candidiasis, unspecified: Secondary | ICD-10-CM | POA: Diagnosis not present

## 2019-06-22 DIAGNOSIS — M2550 Pain in unspecified joint: Secondary | ICD-10-CM | POA: Diagnosis not present

## 2019-06-22 DIAGNOSIS — S72001A Fracture of unspecified part of neck of right femur, initial encounter for closed fracture: Secondary | ICD-10-CM | POA: Diagnosis not present

## 2019-06-22 DIAGNOSIS — T783XXA Angioneurotic edema, initial encounter: Secondary | ICD-10-CM | POA: Diagnosis not present

## 2019-06-22 DIAGNOSIS — F17210 Nicotine dependence, cigarettes, uncomplicated: Secondary | ICD-10-CM | POA: Diagnosis present

## 2019-06-22 DIAGNOSIS — W19XXXA Unspecified fall, initial encounter: Secondary | ICD-10-CM | POA: Diagnosis not present

## 2019-06-22 DIAGNOSIS — Z8371 Family history of colonic polyps: Secondary | ICD-10-CM | POA: Diagnosis not present

## 2019-06-22 DIAGNOSIS — S72141A Displaced intertrochanteric fracture of right femur, initial encounter for closed fracture: Secondary | ICD-10-CM | POA: Diagnosis not present

## 2019-06-22 DIAGNOSIS — K589 Irritable bowel syndrome without diarrhea: Secondary | ICD-10-CM | POA: Diagnosis not present

## 2019-06-22 DIAGNOSIS — Z888 Allergy status to other drugs, medicaments and biological substances status: Secondary | ICD-10-CM | POA: Diagnosis not present

## 2019-06-22 DIAGNOSIS — Z9181 History of falling: Secondary | ICD-10-CM | POA: Diagnosis not present

## 2019-06-22 DIAGNOSIS — S72141D Displaced intertrochanteric fracture of right femur, subsequent encounter for closed fracture with routine healing: Secondary | ICD-10-CM | POA: Diagnosis not present

## 2019-06-22 DIAGNOSIS — Z111 Encounter for screening for respiratory tuberculosis: Secondary | ICD-10-CM | POA: Diagnosis not present

## 2019-06-22 DIAGNOSIS — K5900 Constipation, unspecified: Secondary | ICD-10-CM | POA: Diagnosis not present

## 2019-06-22 DIAGNOSIS — L309 Dermatitis, unspecified: Secondary | ICD-10-CM | POA: Diagnosis not present

## 2019-06-22 DIAGNOSIS — T7840XA Allergy, unspecified, initial encounter: Secondary | ICD-10-CM | POA: Diagnosis not present

## 2019-06-22 DIAGNOSIS — Z66 Do not resuscitate: Secondary | ICD-10-CM | POA: Diagnosis present

## 2019-06-22 DIAGNOSIS — Y9301 Activity, walking, marching and hiking: Secondary | ICD-10-CM | POA: Diagnosis present

## 2019-06-22 DIAGNOSIS — D62 Acute posthemorrhagic anemia: Secondary | ICD-10-CM | POA: Diagnosis not present

## 2019-06-22 DIAGNOSIS — E8889 Other specified metabolic disorders: Secondary | ICD-10-CM | POA: Diagnosis present

## 2019-06-22 DIAGNOSIS — Z79899 Other long term (current) drug therapy: Secondary | ICD-10-CM | POA: Diagnosis not present

## 2019-06-22 HISTORY — PX: INTRAMEDULLARY (IM) NAIL INTERTROCHANTERIC: SHX5875

## 2019-06-22 LAB — BASIC METABOLIC PANEL
Anion gap: 9 (ref 5–15)
Anion gap: 12 (ref 5–15)
BUN: 16 mg/dL (ref 8–23)
BUN: 17 mg/dL (ref 8–23)
CO2: 21 mmol/L — ABNORMAL LOW (ref 22–32)
CO2: 22 mmol/L (ref 22–32)
Calcium: 8.8 mg/dL — ABNORMAL LOW (ref 8.9–10.3)
Calcium: 9.2 mg/dL (ref 8.9–10.3)
Chloride: 101 mmol/L (ref 98–111)
Chloride: 99 mmol/L (ref 98–111)
Creatinine, Ser: 0.9 mg/dL (ref 0.44–1.00)
Creatinine, Ser: 0.89 mg/dL (ref 0.44–1.00)
GFR calc Af Amer: >60 mL/min (ref >60)
GFR calc Af Amer: >60 mL/min (ref >60)
GFR calc non Af Amer: >60 mL/min (ref >60)
GFR calc non Af Amer: >60 mL/min (ref >60)
Glucose, Bld: 147 mg/dL — ABNORMAL HIGH (ref 70–99)
Glucose, Bld: 102 mg/dL — ABNORMAL HIGH (ref 70–99)
Potassium: 4.5 mmol/L (ref 3.5–5.1)
Potassium: 4.1 mmol/L (ref 3.5–5.1)
Sodium: 131 mmol/L — ABNORMAL LOW (ref 135–145)
Sodium: 133 mmol/L — ABNORMAL LOW (ref 135–145)

## 2019-06-22 LAB — CBC WITH DIFFERENTIAL/PLATELET
Abs Immature Granulocytes: 0.08 10*3/uL — ABNORMAL HIGH (ref 0.00–0.07)
Basophils Absolute: 0 10*3/uL (ref 0.0–0.1)
Basophils Relative: 0 %
Eosinophils Absolute: 0 10*3/uL (ref 0.0–0.5)
Eosinophils Relative: 0 %
HCT: 43.4 % (ref 36.0–46.0)
Hemoglobin: 14.2 g/dL (ref 12.0–15.0)
Immature Granulocytes: 1 %
Lymphocytes Relative: 7 %
Lymphs Abs: 1 10*3/uL (ref 0.7–4.0)
MCH: 30.7 pg (ref 26.0–34.0)
MCHC: 32.7 g/dL (ref 30.0–36.0)
MCV: 93.7 fL (ref 80.0–100.0)
Monocytes Absolute: 1.2 10*3/uL — ABNORMAL HIGH (ref 0.1–1.0)
Monocytes Relative: 8 %
Neutro Abs: 12.4 10*3/uL — ABNORMAL HIGH (ref 1.7–7.7)
Neutrophils Relative %: 84 %
Platelets: 276 10*3/uL (ref 150–400)
RBC: 4.63 MIL/uL (ref 3.87–5.11)
RDW: 13.4 % (ref 11.5–15.5)
WBC: 14.7 10*3/uL — ABNORMAL HIGH (ref 4.0–10.5)
nRBC: 0 % (ref 0.0–0.2)

## 2019-06-22 LAB — CBC
HCT: 38.2 % (ref 36.0–46.0)
Hemoglobin: 12.8 g/dL (ref 12.0–15.0)
MCH: 31.3 pg (ref 26.0–34.0)
MCHC: 33.5 g/dL (ref 30.0–36.0)
MCV: 93.4 fL (ref 80.0–100.0)
Platelets: 250 10*3/uL (ref 150–400)
RBC: 4.09 MIL/uL (ref 3.87–5.11)
RDW: 13.2 % (ref 11.5–15.5)
WBC: 16.7 10*3/uL — ABNORMAL HIGH (ref 4.0–10.5)
nRBC: 0 % (ref 0.0–0.2)

## 2019-06-22 LAB — SARS CORONAVIRUS 2 BY RT PCR (HOSPITAL ORDER, PERFORMED IN ~~LOC~~ HOSPITAL LAB): SARS Coronavirus 2: NEGATIVE

## 2019-06-22 LAB — SURGICAL PCR SCREEN
MRSA, PCR: NEGATIVE
Staphylococcus aureus: NEGATIVE

## 2019-06-22 LAB — PROTIME-INR
INR: 1.1 (ref 0.8–1.2)
Prothrombin Time: 13.8 seconds (ref 11.4–15.2)

## 2019-06-22 LAB — ABO/RH: ABO/RH(D): O POS

## 2019-06-22 LAB — HIV ANTIBODY (ROUTINE TESTING W REFLEX): HIV Screen 4th Generation wRfx: NONREACTIVE

## 2019-06-22 SURGERY — FIXATION, FRACTURE, INTERTROCHANTERIC, WITH INTRAMEDULLARY ROD
Anesthesia: General | Laterality: Right

## 2019-06-22 MED ORDER — HYDROMORPHONE HCL 1 MG/ML IJ SOLN
0.2500 mg | INTRAMUSCULAR | Status: DC | PRN
Start: 2019-06-22 — End: 2019-06-22

## 2019-06-22 MED ORDER — ENSURE ENLIVE PO LIQD
237.0000 mL | Freq: Two times a day (BID) | ORAL | Status: DC
  Administered 2019-06-23 – 2019-06-26 (×7): 237 mL via ORAL

## 2019-06-22 MED ORDER — 0.9 % SODIUM CHLORIDE (POUR BTL) OPTIME
TOPICAL | Status: DC | PRN
  Administered 2019-06-22: 1000 mL

## 2019-06-22 MED ORDER — DOCUSATE SODIUM 100 MG PO CAPS
100.0000 mg | ORAL_CAPSULE | Freq: Two times a day (BID) | ORAL | Status: DC
  Administered 2019-06-22 – 2019-06-26 (×9): 100 mg via ORAL
  Filled 2019-06-22 (×9): qty 1

## 2019-06-22 MED ORDER — ONDANSETRON HCL 4 MG PO TABS
4.0000 mg | ORAL_TABLET | Freq: Four times a day (QID) | ORAL | Status: DC | PRN
  Administered 2019-06-22: 4 mg via ORAL
  Filled 2019-06-22: qty 1

## 2019-06-22 MED ORDER — MORPHINE SULFATE (PF) 2 MG/ML IV SOLN
0.5000 mg | INTRAVENOUS | Status: DC | PRN
  Administered 2019-06-22 (×3): 0.5 mg via INTRAVENOUS
  Filled 2019-06-22 (×3): qty 1

## 2019-06-22 MED ORDER — HYDROCODONE-ACETAMINOPHEN 5-325 MG PO TABS
1.0000 | ORAL_TABLET | ORAL | Status: DC | PRN
  Administered 2019-06-22 – 2019-06-26 (×13): 2 via ORAL
  Filled 2019-06-22 (×13): qty 2

## 2019-06-22 MED ORDER — ALUM & MAG HYDROXIDE-SIMETH 200-200-20 MG/5ML PO SUSP: 30.0000 mL | ORAL | Status: DC | PRN

## 2019-06-22 MED ORDER — PROPOFOL 10 MG/ML IV BOLUS
INTRAVENOUS | Status: AC
  Filled 2019-06-22: qty 20

## 2019-06-22 MED ORDER — MENTHOL 3 MG MT LOZG: 1.0000 | LOZENGE | OROMUCOSAL | Status: DC | PRN

## 2019-06-22 MED ORDER — LIDOCAINE 2% (20 MG/ML) 5 ML SYRINGE
INTRAMUSCULAR | Status: DC | PRN
Start: 2019-06-22 — End: 2019-06-22
  Administered 2019-06-22: 100 mg via INTRAVENOUS

## 2019-06-22 MED ORDER — METOCLOPRAMIDE HCL 5 MG PO TABS: 5.0000 mg | ORAL_TABLET | Freq: Three times a day (TID) | ORAL | Status: DC | PRN

## 2019-06-22 MED ORDER — ACETAMINOPHEN 325 MG PO TABS
650.0000 mg | ORAL_TABLET | Freq: Four times a day (QID) | ORAL | Status: DC
  Administered 2019-06-22 – 2019-06-25 (×7): 650 mg via ORAL
  Filled 2019-06-22 (×11): qty 2

## 2019-06-22 MED ORDER — PROPOFOL 10 MG/ML IV BOLUS
INTRAVENOUS | Status: DC | PRN
  Administered 2019-06-22: 100 mg via INTRAVENOUS

## 2019-06-22 MED ORDER — CEFAZOLIN SODIUM-DEXTROSE 2-4 GM/100ML-% IV SOLN
2.0000 g | Freq: Four times a day (QID) | INTRAVENOUS | Status: AC
  Administered 2019-06-22 (×2): 2 g via INTRAVENOUS
  Filled 2019-06-22 (×2): qty 100

## 2019-06-22 MED ORDER — FENTANYL CITRATE (PF) 250 MCG/5ML IJ SOLN
INTRAMUSCULAR | Status: DC | PRN
  Administered 2019-06-22 (×4): 50 ug via INTRAVENOUS

## 2019-06-22 MED ORDER — PHENOL 1.4 % MT LIQD: 1.0000 | OROMUCOSAL | Status: DC | PRN

## 2019-06-22 MED ORDER — METHOCARBAMOL 500 MG PO TABS
500.0000 mg | ORAL_TABLET | Freq: Four times a day (QID) | ORAL | Status: DC | PRN
  Administered 2019-06-22 – 2019-06-26 (×7): 500 mg via ORAL
  Filled 2019-06-22 (×9): qty 1

## 2019-06-22 MED ORDER — ENOXAPARIN SODIUM 40 MG/0.4ML ~~LOC~~ SOLN
40.0000 mg | SUBCUTANEOUS | Status: DC
  Administered 2019-06-23 – 2019-06-26 (×4): 40 mg via SUBCUTANEOUS
  Filled 2019-06-22 (×4): qty 0.4

## 2019-06-22 MED ORDER — PANTOPRAZOLE SODIUM 40 MG PO TBEC
40.0000 mg | DELAYED_RELEASE_TABLET | Freq: Every day | ORAL | Status: DC
  Administered 2019-06-23 – 2019-06-26 (×4): 40 mg via ORAL
  Filled 2019-06-22 (×5): qty 1

## 2019-06-22 MED ORDER — METHOCARBAMOL 1000 MG/10ML IJ SOLN
500.0000 mg | Freq: Four times a day (QID) | INTRAVENOUS | Status: DC | PRN
  Administered 2019-06-22: 500 mg via INTRAVENOUS
  Filled 2019-06-22 (×2): qty 5

## 2019-06-22 MED ORDER — METOCLOPRAMIDE HCL 5 MG/ML IJ SOLN: 5.0000 mg | Freq: Three times a day (TID) | INTRAMUSCULAR | Status: DC | PRN

## 2019-06-22 MED ORDER — MORPHINE SULFATE (PF) 2 MG/ML IV SOLN
0.5000 mg | INTRAVENOUS | Status: DC | PRN
  Administered 2019-06-23 (×2): 0.5 mg via INTRAVENOUS
  Administered 2019-06-26: 1 mg via INTRAVENOUS
  Filled 2019-06-22 (×3): qty 1

## 2019-06-22 MED ORDER — ONDANSETRON HCL 4 MG/2ML IJ SOLN
INTRAMUSCULAR | Status: DC | PRN
  Administered 2019-06-22: 4 mg via INTRAVENOUS

## 2019-06-22 MED ORDER — ACETAMINOPHEN 325 MG PO TABS: 325.0000 mg | ORAL_TABLET | Freq: Four times a day (QID) | ORAL | Status: DC | PRN

## 2019-06-22 MED ORDER — MIDAZOLAM HCL 2 MG/2ML IJ SOLN
INTRAMUSCULAR | Status: AC
  Filled 2019-06-22: qty 2

## 2019-06-22 MED ORDER — DEXAMETHASONE SODIUM PHOSPHATE 10 MG/ML IJ SOLN
INTRAMUSCULAR | Status: DC | PRN
  Administered 2019-06-22: 4 mg via INTRAVENOUS

## 2019-06-22 MED ORDER — POLYETHYLENE GLYCOL 3350 17 G PO PACK: 17.0000 g | PACK | Freq: Every day | ORAL | Status: DC | PRN

## 2019-06-22 MED ORDER — LACTATED RINGERS IV SOLN
INTRAVENOUS | Status: DC | PRN
  Administered 2019-06-22: 08:00:00 via INTRAVENOUS

## 2019-06-22 MED ORDER — SENNOSIDES-DOCUSATE SODIUM 8.6-50 MG PO TABS: 2.0000 | ORAL_TABLET | Freq: Every evening | ORAL | Status: DC | PRN

## 2019-06-22 MED ORDER — ONDANSETRON HCL 4 MG/2ML IJ SOLN: 4.0000 mg | Freq: Four times a day (QID) | INTRAMUSCULAR | Status: DC | PRN

## 2019-06-22 MED ORDER — PHENYLEPHRINE 40 MCG/ML (10ML) SYRINGE FOR IV PUSH (FOR BLOOD PRESSURE SUPPORT)
PREFILLED_SYRINGE | INTRAVENOUS | Status: DC | PRN
  Administered 2019-06-22 (×5): 80 ug via INTRAVENOUS

## 2019-06-22 MED ORDER — CEFAZOLIN SODIUM-DEXTROSE 2-3 GM-%(50ML) IV SOLR
INTRAVENOUS | Status: DC | PRN
  Administered 2019-06-22: 2 g via INTRAVENOUS

## 2019-06-22 MED ORDER — ENOXAPARIN SODIUM 40 MG/0.4ML ~~LOC~~ SOLN: 40.0000 mg | SUBCUTANEOUS | Status: DC

## 2019-06-22 MED ORDER — MIDAZOLAM HCL 5 MG/5ML IJ SOLN
INTRAMUSCULAR | Status: DC | PRN
  Administered 2019-06-22: 1 mg via INTRAVENOUS

## 2019-06-22 MED ORDER — SODIUM CHLORIDE 0.9 % IV SOLN
INTRAVENOUS | Status: DC | PRN
  Administered 2019-06-22: 09:00:00 40 ug/min via INTRAVENOUS

## 2019-06-22 MED ORDER — PROMETHAZINE HCL 25 MG/ML IJ SOLN: 6.2500 mg | INTRAMUSCULAR | Status: DC | PRN

## 2019-06-22 MED ORDER — FENTANYL CITRATE (PF) 250 MCG/5ML IJ SOLN
INTRAMUSCULAR | Status: AC
  Filled 2019-06-22: qty 5

## 2019-06-22 MED ORDER — ROCURONIUM BROMIDE 10 MG/ML (PF) SYRINGE
PREFILLED_SYRINGE | INTRAVENOUS | Status: DC | PRN
  Administered 2019-06-22 (×2): 10 mg via INTRAVENOUS
  Administered 2019-06-22: 50 mg via INTRAVENOUS
  Administered 2019-06-22 (×2): 10 mg via INTRAVENOUS

## 2019-06-22 SURGICAL SUPPLY — 53 items
BIT DRILL 4.3MMS DISTAL GRDTED (BIT) IMPLANT
BNDG COHESIVE 6X5 TAN STRL LF (GAUZE/BANDAGES/DRESSINGS) IMPLANT
BRUSH SCRUB EZ PLAIN DRY (MISCELLANEOUS) ×4 IMPLANT
COVER PERINEAL POST (MISCELLANEOUS) ×2 IMPLANT
COVER SURGICAL LIGHT HANDLE (MISCELLANEOUS) ×4 IMPLANT
COVER WAND RF STERILE (DRAPES) ×2 IMPLANT
DRAPE C-ARMOR (DRAPES) ×2 IMPLANT
DRAPE HALF SHEET 40X57 (DRAPES) IMPLANT
DRAPE ORTHO SPLIT 77X108 STRL (DRAPES)
DRAPE STERI IOBAN 125X83 (DRAPES) ×2 IMPLANT
DRAPE SURG ORHT 6 SPLT 77X108 (DRAPES) IMPLANT
DRAPE U-SHAPE 47X51 STRL (DRAPES) ×2 IMPLANT
DRILL 4.3MMS DISTAL GRADUATED (BIT) ×2
DRSG EMULSION OIL 3X3 NADH (GAUZE/BANDAGES/DRESSINGS) ×2 IMPLANT
DRSG MEPILEX BORDER 4X4 (GAUZE/BANDAGES/DRESSINGS) ×2 IMPLANT
DRSG MEPILEX BORDER 4X8 (GAUZE/BANDAGES/DRESSINGS) ×2 IMPLANT
ELECT REM PT RETURN 9FT ADLT (ELECTROSURGICAL) ×2
ELECTRODE REM PT RTRN 9FT ADLT (ELECTROSURGICAL) ×1 IMPLANT
GLOVE BIO SURGEON STRL SZ7.5 (GLOVE) ×2 IMPLANT
GLOVE BIO SURGEON STRL SZ8 (GLOVE) ×2 IMPLANT
GLOVE BIOGEL PI IND STRL 7.5 (GLOVE) ×1 IMPLANT
GLOVE BIOGEL PI IND STRL 8 (GLOVE) ×1 IMPLANT
GLOVE BIOGEL PI INDICATOR 7.5 (GLOVE) ×1
GLOVE BIOGEL PI INDICATOR 8 (GLOVE) ×1
GOWN STRL REUS W/ TWL LRG LVL3 (GOWN DISPOSABLE) ×2 IMPLANT
GOWN STRL REUS W/ TWL XL LVL3 (GOWN DISPOSABLE) ×1 IMPLANT
GOWN STRL REUS W/TWL LRG LVL3 (GOWN DISPOSABLE) ×4
GOWN STRL REUS W/TWL XL LVL3 (GOWN DISPOSABLE) ×2
GUIDEPIN 3.2X17.5 THRD DISP (PIN) ×1 IMPLANT
GUIDEWIRE BALL NOSE 80CM (WIRE) ×1 IMPLANT
HFN RH 130 DEG 9MM X 360MM (Nail) ×1 IMPLANT
HIP FRA NAIL LAG SCREW 10.5X90 (Orthopedic Implant) ×2 IMPLANT
KIT BASIN OR (CUSTOM PROCEDURE TRAY) ×2 IMPLANT
KIT TURNOVER KIT B (KITS) ×2 IMPLANT
MANIFOLD NEPTUNE II (INSTRUMENTS) ×2 IMPLANT
NS IRRIG 1000ML POUR BTL (IV SOLUTION) ×2 IMPLANT
PACK GENERAL/GYN (CUSTOM PROCEDURE TRAY) ×2 IMPLANT
PAD ARMBOARD 7.5X6 YLW CONV (MISCELLANEOUS) ×4 IMPLANT
SCREW BONE CORTICAL 5.0X40 (Screw) ×1 IMPLANT
SCREW BONE CORTICAL 5.0X42 (Screw) ×1 IMPLANT
SCREW LAG HIP FRA NAIL 10.5X90 (Orthopedic Implant) IMPLANT
STAPLER VISISTAT 35W (STAPLE) ×2 IMPLANT
STOCKINETTE IMPERVIOUS LG (DRAPES) IMPLANT
SUT ETHILON 3 0 PS 1 (SUTURE) ×2 IMPLANT
SUT VIC AB 0 CT1 27 (SUTURE) ×2
SUT VIC AB 0 CT1 27XBRD ANBCTR (SUTURE) ×1 IMPLANT
SUT VIC AB 1 CT1 27 (SUTURE) ×2
SUT VIC AB 1 CT1 27XBRD ANBCTR (SUTURE) ×1 IMPLANT
SUT VIC AB 2-0 CT1 27 (SUTURE) ×2
SUT VIC AB 2-0 CT1 TAPERPNT 27 (SUTURE) ×1 IMPLANT
TOWEL GREEN STERILE (TOWEL DISPOSABLE) ×4 IMPLANT
TOWEL GREEN STERILE FF (TOWEL DISPOSABLE) ×2 IMPLANT
WATER STERILE IRR 1000ML POUR (IV SOLUTION) ×2 IMPLANT

## 2019-06-22 NOTE — Progress Notes (Signed)
CSW acknowledges SNF consult, pending PT/OT recommendations. Will continue to follow for discharge planning.   [place], [place]

## 2019-06-22 NOTE — Progress Notes (Signed)
Initial Nutrition Assessment  DOCUMENTATION CODES:   Obesity unspecified  INTERVENTION:  Once diet advances, provide Ensure Enlive po BID, each supplement provides 350 kcal and 20 grams of protein.  NUTRITION DIAGNOSIS:   Inadequate oral intake related to post-op healing as evidenced by estimated needs.  GOAL:   Patient will meet greater than or equal to 90% of their needs  MONITOR:   Diet advancement, Supplement acceptance, Labs, Weight trends, Skin, I & O's  REASON FOR ASSESSMENT:   Consult Assessment of nutrition requirement/status  ASSESSMENT:   73 y.o. female with with history of depression and GERD presents after fall at home. X-ray shows a right hip fracture  Pt is currently in OR for intramedullary nailing of right hip. RD unable to obtain pt nutrition history at this time. RD to order nutritional supplements to aid in post op healing.   Unable to complete Nutrition-Focused physical exam at this time.   Labs and medications reviewed.   Diet Order:   Diet Order            Diet regular Room service appropriate? Yes; Fluid consistency: Thin  Diet effective 1000              EDUCATION NEEDS:   Not appropriate for education at this time  Skin:  Skin Assessment: Reviewed RN Assessment  Last BM:  Unknown  Height:   Ht Readings from Last 1 Encounters:  06/21/19 5\' 4"  (1.626 m)    Weight:   Wt Readings from Last 1 Encounters:  06/21/19 87.1 kg    Ideal Body Weight:  54.5 kg  BMI:  Body mass index is 32.96 kg/m.  Estimated Nutritional Needs:   Kcal:  1700-1900  Protein:  85-95 grams  Fluid:  1.7 - 1.9 L/day    Corrin Parker, MS, RD, LDN Pager # [phone] After hours/ weekend pager # [phone]

## 2019-06-22 NOTE — Op Note (Signed)
06/22/2019  10:36 AM  PATIENT:  Emily Horton  73 y.o. female  PRE-OPERATIVE DIAGNOSIS:  Right 3 part intertrochanteric femur fracture  POST-OPERATIVE DIAGNOSIS:  Right 3 partintertrochanteric femur fracture  PROCEDURE:  Intramedullary nailing of the right hip using a Biomet Affixus nail.  SURGEON:  Astrid Divine. Marcelino Scot, M.D.  ASSISTANT:  None.  ANESTHESIA:  General.  COMPLICATIONS:  None.  ESTIMATED BLOOD LOSS:  Less than 150 mL.  DISPOSITION:  To PACU.  CONDITION:  Stable.  BRIEF SUMMARY AND INDICATION OF PROCEDURE:  [name] is a 46 y.o. year- old independently living female who does not use walking aids.  I discussed with the patient the risks and benefits of surgical treatment including the potential for malunion, nonunion, symptomatic hardware, heart attack, stroke, neurovascular injury, bleeding, and others.  After full discussion, the patient provided consent to proceed.  BRIEF SUMMARY OF PROCEDURE:  The patient was taken to the operating room where general anesthesia was induced.  She was positioned supine on the Hana fracture table.  A closed reduction maneuver was performed of the fractured proximal femur and this was confirmed on both AP and lateral xray views. A thorough scrub and wash with chlorhexidine and then Betadine scrub and paint was performed.  After sterile drapes and time-out, a long instrument was used to identify the appropriate starting position under C-arm on both AP and lateral images.  A 3 cm incision was made proximal to the greater trochanter.  The curved cannulated awl was inserted just medial to the tip of the lateral trochanter and then the starting guidewire advanced into the proximal femur. I was careful to maintain reduction of this comminuted fracture at all times. This was checked on AP and lateral views.  The starting reamer was engaged with the soft tissue protected by a sleeve.  The curved ball-tipped guidewire was then inserted,  making sure it was as posterior as possible in the distal femur and across the fracture site, which stayed in a reduced position.  It was sequentially reamed up to 11 and an 9 x 360 mm nail inserted to the appropriate depth.  The guidewire for the lag screw was then inserted with the appropriate anteversion to make sure it was in a center-center position.  This was measured and the lag screw placed with excellent purchase and position checked on both views. Traction was released and compression achieved with the screw. Were it not for the comminuted, displaced greater troch segment the upper shoulder of the lag screw would be outside of the cortex; instead it remained within the fracture site laterally and still could telescope.  This was followed by placement of two distal locking screws using perfect circle technique.  This was confirmed on AP and lateral images. Wounds were irrigated thoroughly, closed in a standard layered fashion. Sterile gently compressive dressings were applied.  The patient was awakened from anesthesia and transported to the PACU in stable condition.  PROGNOSIS:  The patient will be weightbearing as tolerated with physical therapy beginning DVT prophylaxis as soon as deemed stable by the Primary Care Service.  She has no range of motion precautions.  We will continue to follow through at the hospital.  Anticipate follow up in the office in 2 weeks for removal of sutures and further evaluation. Lovenox for 4 wks.     Astrid Divine. Marcelino Scot, M.D.

## 2019-06-22 NOTE — ED Notes (Signed)
ED TO INPATIENT HANDOFF REPORT  ED Nurse Name and Phone #:  [phone]  S Name/Age/Gender Emily Horton 73 y.o. female Room/Bed: 039C/039C  Code Status   Code Status: Full Code  Home/SNF/Other Home Patient oriented to: self, place, time and situation Is this baseline? Yes   Triage Complete: Triage complete  Chief Complaint hip injury  Triage Note Pt from home, tripped and fell in driveway, down about 20 mins before anyone found her. C.o right hip pain, no deformity noted, pulses palpable. No LOC. Pt arrives a.o  115mcg fentanyl given en route   Allergies Allergies  Allergen Reactions  . Eggs Or Egg-Derived Products     Hives   . Zoloft [Sertraline Hcl]     Headaches     Level of Care/Admitting Diagnosis ED Disposition    ED Disposition Condition [facility] Area: [place] [100100]  Level of Care: Med-Surg [16]  Covid Evaluation: Asymptomatic Screening Protocol (No Symptoms)  Diagnosis: Closed right hip fracture, initial encounter [facility]) [[id number]]  Admitting Physician: Rise Patience [phone]  Attending Physician: Rise Patience [phone]  Estimated length of stay: past midnight tomorrow  Certification:: I certify this patient will need inpatient services for at least 2 midnights  PT Class (Do Not Modify): Inpatient [101]  PT Acc Code (Do Not Modify): Private [1]       B Medical/Surgery History Past Medical History:  Diagnosis Date  . Colon polyps 09-25-2007   Colonoscopy  . Depression   . Diverticulosis of colon (without mention of hemorrhage) 09-25-2007   Colonoscopy  . GERD (gastroesophageal reflux disease)   . Gynecological examination    sees Dr. Carren Rang   . Hyperlipidemia    Past Surgical History:  Procedure Laterality Date  . COLONOSCOPY  12/30/2015   per Dr. Henrene Pastor, benign  polyps, repeat in 5  yrs.   . ESOPHAGOGASTRODUODENOSCOPY  06-30-12   per Dr. Sharlett Iles, reflux but no Barretts seen      A IV  Location/Drains/Wounds Patient Lines/Drains/Airways Status   Active Line/Drains/Airways    Name:   Placement date:   Placement time:   Site:   Days:   Peripheral IV 06/21/19 Anterior;Left;Proximal Forearm   06/21/19    2225    Forearm   1          Intake/Output Last 24 hours No intake or output data in the 24 hours ending 06/22/19 0110  Labs/Imaging Results for orders placed or performed during the hospital encounter of 06/21/19 (from the past 48 hour(s))  Type and screen [place]     Status: None   Collection Time: 06/21/19 11:00 PM  Result Value Ref Range   ABO/RH(D) O POS    Antibody Screen NEG    Sample Expiration      06/24/2019,2359 Performed at [facility], [place] [address], [place], [place] [address]   CBC     Status: Abnormal   Collection Time: 06/21/19 11:08 PM  Result Value Ref Range   WBC 19.7 (H) 4.0 - 10.5 K/uL   RBC 4.64 3.87 - 5.11 MIL/uL   Hemoglobin 14.3 12.0 - 15.0 g/dL   HCT 43.2 36.0 - 46.0 %   MCV 93.1 80.0 - 100.0 fL   MCH 30.8 26.0 - 34.0 pg   MCHC 33.1 30.0 - 36.0 g/dL   RDW 13.2 11.5 - 15.5 %   Platelets 304 150 - 400 K/uL   nRBC 0.0 0.0 - 0.2 %  Comment: Performed at [facility], [place] [address], [place], [place] [address]  Basic metabolic panel     Status: Abnormal   Collection Time: 06/21/19 11:08 PM  Result Value Ref Range   Sodium 132 (L) 135 - 145 mmol/L   Potassium 4.3 3.5 - 5.1 mmol/L   Chloride 100 98 - 111 mmol/L   CO2 23 22 - 32 mmol/L   Glucose, Bld 103 (H) 70 - 99 mg/dL   BUN 15 8 - 23 mg/dL   Creatinine, Ser 0.95 0.44 - 1.00 mg/dL   Calcium 9.3 8.9 - 10.3 mg/dL   GFR calc non Af Amer >60 >60 mL/min   GFR calc Af Amer >60 >60 mL/min   Anion gap 9 5 - 15    Comment: Performed at [facility], [place] [address], [place], [place] [address]   Dg Chest 1 View  Result Date: 06/21/2019 CLINICAL DATA:  Fall, hip fracture EXAM: CHEST  1 VIEW COMPARISON:  07/06/2016 FINDINGS: Heart and mediastinal  contours are within normal limits. No focal opacities or effusions. No acute bony abnormality. IMPRESSION: No active disease. Electronically Signed   By: Rolm Baptise M.D.   On: 06/21/2019 23:55   Dg Hip Unilat W Or Wo Pelvis 2-3 Views Right  Result Date: 06/21/2019 CLINICAL DATA:  Fall.  Right hip pain. EXAM: DG HIP (WITH OR WITHOUT PELVIS) 2-3V RIGHT COMPARISON:  None. FINDINGS: There is a proximal right femoral intertrochanteric fracture with varus angulation. No subluxation or dislocation. Joint spaces are maintained and symmetric. IMPRESSION: Right femoral intertrochanteric fracture with varus angulation. Electronically Signed   By: Rolm Baptise M.D.   On: 06/21/2019 23:54    Pending Labs Unresulted Labs (From admission, onward)    Start     Ordered   06/22/19 0500  CBC  Tomorrow morning,   R     06/22/19 0047   06/22/19 0350  Basic metabolic panel  Tomorrow morning,   R     06/22/19 0047   06/22/19 0048  HIV antibody (Routine Testing)  Once,   STAT     06/22/19 0047   06/22/19 0015  SARS Coronavirus 2 (CEPHEID - Performed in [place] hospital lab), Hosp Order  (Asymptomatic Patients Labs)  Once,   STAT    Question:  Rule Out  Answer:  Yes   06/22/19 0014   06/21/19 2330  Protime-INR  Once,   R     06/21/19 2330   06/21/19 2300  ABO/Rh  Once,   R     06/21/19 2300          Vitals/Pain Today's Vitals   06/21/19 2226 06/21/19 2228 06/21/19 2303  BP: (!) 150/93  135/86  Pulse: 79  76  Resp: (!) 22  19  Temp: 98.5 F (36.9 C)    TempSrc: Oral    SpO2: 97%  99%  Weight:  87.1 kg   Height:  5\' 4"  (1.626 m)   PainSc: 10-Worst pain ever      Isolation Precautions No active isolations  Medications Medications  morphine 2 MG/ML injection 0.5 mg (has no administration in time range)  methocarbamol (ROBAXIN) tablet 500 mg (has no administration in time range)    Or  methocarbamol (ROBAXIN) 500 mg in dextrose 5 % 50 mL IVPB (has no administration in time range)   HYDROmorphone (DILAUDID) injection 1 mg (1 mg Intravenous Given 06/21/19 2309)    Mobility non-ambulatory Low fall risk   Focused  Assessments Cardiac Assessment Handoff:  Cardiac Rhythm: Normal sinus rhythm No results found for: CKTOTAL, CKMB, CKMBINDEX, TROPONINI Lab Results  Component Value Date   DDIMER 1.12 (H) 07/06/2016   Does the Patient currently have chest pain? No     R Recommendations: See Admitting Provider Note  Report given to:   Additional Notes:

## 2019-06-22 NOTE — Progress Notes (Signed)
Patient having trouble urinating. Urinalysis has not been collected. Bladder scan complete with 241 mL in bladder.

## 2019-06-22 NOTE — ED Notes (Signed)
attempted to call report

## 2019-06-22 NOTE — Anesthesia Procedure Notes (Signed)
Procedure Name: Intubation Date/Time: 06/22/2019 8:19 AM Performed by: Wilburn Cornelia, CRNA Pre-anesthesia Checklist: Patient identified, Emergency Drugs available, Suction available, Patient being monitored and Timeout performed Patient Re-evaluated:Patient Re-evaluated prior to induction Oxygen Delivery Method: Circle system utilized Preoxygenation: Pre-oxygenation with 100% oxygen Induction Type: IV induction Ventilation: Mask ventilation without difficulty Laryngoscope Size: Mac and 3 Grade View: Grade III Tube type: Oral Tube size: 7.0 mm Number of attempts: 1 Airway Equipment and Method: Stylet Placement Confirmation: ETT inserted through vocal cords under direct vision,  positive ETCO2,  CO2 detector and breath sounds checked- equal and bilateral Secured at: 21 cm Tube secured with: Tape Dental Injury: Teeth and Oropharynx as per pre-operative assessment

## 2019-06-22 NOTE — H&P (Signed)
History and Physical    Emily Horton CVE:[id number] DOB: [birth year] DOA: 06/21/2019  PCP: Laurey Morale, MD  Patient coming from: Home.  Chief Complaint: Fall.  HPI: Emily Horton is a 73 y.o. female with with history of depression and GERD was brought to the ER after patient had a fall at home.  Patient states she was collecting her main and returning back when she tripped on her flip-flop.  Fell onto her side but did not hit her head or lose consciousness.  Did not have any chest pain or shortness of breath.  Patient started hurting of the right hip area and was brought to the ER.  ED Course: In the ER x-ray shows a right hip fracture.  Labs revealed leukocytosis patient was afebrile chest x-ray unremarkable COVID-19 test was negative.  Sodium was 132.  On-call orthopedic surgeon Dr. Doreatha Martin was consulted patient admitted for likely surgery later today.  On falling patient also did hurt her right elbow where she scraped the skin.  Review of Systems: As per HPI, rest all negative.   Past Medical History:  Diagnosis Date  . Colon polyps 09-25-2007   Colonoscopy  . Depression   . Diverticulosis of colon (without mention of hemorrhage) 09-25-2007   Colonoscopy  . GERD (gastroesophageal reflux disease)   . Gynecological examination    sees Dr. Carren Rang   . Hyperlipidemia     Past Surgical History:  Procedure Laterality Date  . COLONOSCOPY  12/30/2015   per Dr. Henrene Pastor, benign  polyps, repeat in 5  yrs.   . ESOPHAGOGASTRODUODENOSCOPY  06-30-12   per Dr. Sharlett Iles, reflux but no Barretts seen      reports that she has been smoking cigarettes. She has a 15.00 pack-year smoking history. She has never used smokeless tobacco. She reports current alcohol use. She reports that she does not use drugs.  Allergies  Allergen Reactions  . Eggs Or Egg-Derived Products     Hives   . Zoloft [Sertraline Hcl]     Headaches     Family History  Problem Relation Age of Onset  . Breast cancer  Maternal Grandmother   . Crohn's disease Father   . Colon polyps Father   . Stroke Paternal Grandfather     Prior to Admission medications   Medication Sig Start Date End Date Taking? Authorizing Provider  aspirin-acetaminophen-caffeine (EXCEDRIN MIGRAINE) [id number] MG tablet Take 1 tablet by mouth every 6 (six) hours as needed for headache.   Yes [provider]  buPROPion (WELLBUTRIN XL) 300 MG 24 hr tablet TAKE 1 TABLET BY MOUTH EVERY MORNING Patient taking differently: Take 300 mg by mouth every morning.  11/01/18  Yes Laurey Morale, MD  cetirizine (ZYRTEC) 10 MG tablet TAKE 1 TABLET(10 MG) BY MOUTH DAILY Patient taking differently: Take 10 mg by mouth every morning.  06/05/19  Yes Laurey Morale, MD  EPINEPHrine 0.3 mg/0.3 mL IJ SOAJ injection Inject 0.3 mLs into the muscle daily as needed for anaphylaxis.  04/17/19  Yes [provider]  hyoscyamine (LEVSIN SL) 0.125 MG SL tablet Place 1 tablet (0.125 mg total) under the tongue every 4 (four) hours as needed. Patient taking differently: Place 0.125 mg under the tongue every 4 (four) hours as needed for cramping.  06/19/19  Yes Laurey Morale, MD  omeprazole (PRILOSEC) 40 MG capsule TAKE 1 CAPSULE BY MOUTH TWICE DAILY Patient taking differently: Take 40 mg by mouth See admin instructions. Take every morning can take  a second time as needed for acid reflux 05/15/19  Yes Laurey Morale, MD  triamcinolone cream (KENALOG) 0.1 % Apply 1 application topically 2 (two) times daily. 06/19/19  Yes Laurey Morale, MD  venlafaxine XR (EFFEXOR XR) 75 MG 24 hr capsule Take 1 capsule (75 mg total) by mouth daily with breakfast. 06/19/19  Yes Laurey Morale, MD    Physical Exam: Constitutional: Moderately built and nourished. Vitals:   06/21/19 2226 06/21/19 2228 06/21/19 2303  BP: (!) 150/93  135/86  Pulse: 79  76  Resp: (!) 22  19  Temp: 98.5 F (36.9 C)    TempSrc: Oral    SpO2: 97%  99%  Weight:  87.1 kg   Height:  5\' 4"  (1.626  m)    Eyes: Anicteric no pallor. ENMT: No discharge from the ears eyes nose or mouth. Neck: No mass felt.  No neck rigidity. Respiratory: No rhonchi or crepitations. Cardiovascular: S1-S2 heard. Abdomen: Soft nontender bowel sounds present. Musculoskeletal: Pain on moving right hip. Skin: No rash. Neurologic: Alert awake oriented to time place and person.  Moves all extremities. Psychiatric: Appears normal per normal affect.   Labs on Admission: I have personally reviewed following labs and imaging studies  CBC: Recent Labs  Lab 06/19/19 1121 06/21/19 2308  WBC 7.8 19.7*  NEUTROABS 4.5  --   HGB 14.5 14.3  HCT 43.8 43.2  MCV 93.1 93.1  PLT 284.0 662   Basic Metabolic Panel: Recent Labs  Lab 06/19/19 1121 06/21/19 2308  NA 137 132*  K 4.2 4.3  CL 102 100  CO2 26 23  GLUCOSE 95 103*  BUN 14 15  CREATININE 0.91 0.95  CALCIUM 9.5 9.3   GFR: Estimated Creatinine Clearance: 58.9 mL/min (by C-G formula based on SCr of 0.95 mg/dL). Liver Function Tests: Recent Labs  Lab 06/19/19 1121  AST 8  ALT 6  ALKPHOS 91  BILITOT 0.6  PROT 6.4  ALBUMIN 4.4   No results for input(s): LIPASE, AMYLASE in the last 168 hours. No results for input(s): AMMONIA in the last 168 hours. Coagulation Profile: No results for input(s): INR, PROTIME in the last 168 hours. Cardiac Enzymes: No results for input(s): CKTOTAL, CKMB, CKMBINDEX, TROPONINI in the last 168 hours. BNP (last 3 results) No results for input(s): PROBNP in the last 8760 hours. HbA1C: No results for input(s): HGBA1C in the last 72 hours. CBG: No results for input(s): GLUCAP in the last 168 hours. Lipid Profile: Recent Labs    06/19/19 1121  CHOL 242*  HDL 45.50  LDLCALC 157*  TRIG 196.0*  CHOLHDL 5   Thyroid Function Tests: Recent Labs    06/19/19 1121  TSH 2.52   Anemia Panel: No results for input(s): VITAMINB12, FOLATE, FERRITIN, TIBC, IRON, RETICCTPCT in the last 72 hours. Urine analysis:     Component Value Date/Time   COLORURINE YELLOW 09/03/2015 [place] 09/03/2015 0942   LABSPEC 1.020 09/03/2015 0942   PHURINE 6.0 09/03/2015 0942   GLUCOSEU NEGATIVE 09/03/2015 0942   HGBUR TRACE-INTACT (A) 09/03/2015 0942   HGBUR trace-lysed 08/28/2010 0803   BILIRUBINUR n 06/19/2019 1131   KETONESUR NEGATIVE 09/03/2015 0942   PROTEINUR Negative 06/19/2019 1131   UROBILINOGEN 0.2 06/19/2019 1131   UROBILINOGEN 0.2 09/03/2015 0942   NITRITE n 06/19/2019 1131   NITRITE NEGATIVE 09/03/2015 0942   LEUKOCYTESUR Negative 06/19/2019 1131   Sepsis Labs: @LABRCNTIP (procalcitonin:4,lacticidven:4) )No results found for this or any previous visit (from the past  240 hour(s)).   Radiological Exams on Admission: Dg Chest 1 View  Result Date: 06/21/2019 CLINICAL DATA:  Fall, hip fracture EXAM: CHEST  1 VIEW COMPARISON:  07/06/2016 FINDINGS: Heart and mediastinal contours are within normal limits. No focal opacities or effusions. No acute bony abnormality. IMPRESSION: No active disease. Electronically Signed   By: Rolm Baptise M.D.   On: 06/21/2019 23:55   Dg Hip Unilat W Or Wo Pelvis 2-3 Views Right  Result Date: 06/21/2019 CLINICAL DATA:  Fall.  Right hip pain. EXAM: DG HIP (WITH OR WITHOUT PELVIS) 2-3V RIGHT COMPARISON:  None. FINDINGS: There is a proximal right femoral intertrochanteric fracture with varus angulation. No subluxation or dislocation. Joint spaces are maintained and symmetric. IMPRESSION: Right femoral intertrochanteric fracture with varus angulation. Electronically Signed   By: Rolm Baptise M.D.   On: 06/21/2019 23:54    Assessment/Plan Principal Problem:   Closed right hip fracture, initial encounter [facility]) Active Problems:   Depression   GERD   Dyslipidemia    1. Closed right hip fracture status post mechanical fall -appears to be at moderate risk 20 mg procedure.  We will keep patient n.p.o. in anticipation of procedure pain relief medications. 2. History  of depression presently n.p.o. 3. History of GERD on PPI. 4. Leukocytosis patient is afebrile likely could be reactionary UA is pending chest x-ray unremarkable.  Given that patient will need surgery and further care and close attention patient will need inpatient status.   DVT prophylaxis: SCDs.  Changed to pharmacological DVT prophylaxis once okay with orthopedics. Code Status: DNR as confirmed with patient. Family Communication: Discussed with patient.  Patient states her daughter knows about her admission. Disposition Plan: Probably rehab. Consults called: Orthopedics. Admission status: Inpatient.   Rise Patience MD [facility] Pager [phone].  If 7PM-7AM, please contact night-coverage [email or web address] Password [facility]  06/22/2019, 12:45 AM

## 2019-06-22 NOTE — Transfer of Care (Signed)
Immediate Anesthesia Transfer of Care Note  Patient: Emily Horton  Procedure(s) Performed: INTRAMEDULLARY (IM) NAIL INTERTROCHANTRIC (Right )  Patient Location: PACU  Anesthesia Type:General  Level of Consciousness: awake, alert  and oriented  Airway & Oxygen Therapy: Patient Spontanous Breathing and Patient connected to nasal cannula oxygen  Post-op Assessment: Report given to RN and Post -op Vital signs reviewed and stable  Post vital signs: Reviewed and stable  Last Vitals:  Vitals Value Taken Time  BP 116/66 06/22/19 1016  Temp    Pulse    Resp 20 06/22/19 1017  SpO2    Vitals shown include unvalidated device data.  Last Pain:  Vitals:   06/22/19 0551  TempSrc:   PainSc: 7          Complications: No apparent anesthesia complications

## 2019-06-22 NOTE — Progress Notes (Signed)
Reviewed imaging with Dr. Betsey Holiday. 73 yo F w/ R intertrochanteric femur fracture. Plan for cephalomedullary nailing later today. Formal consult to follow later this AM.  Shona Needles, MD [facility] [phone] (phone) [phone] (office) [email or web address]

## 2019-06-22 NOTE — ED Notes (Signed)
Attempted to call report

## 2019-06-22 NOTE — Anesthesia Postprocedure Evaluation (Signed)
Anesthesia Post Note  Patient: [name]  Procedure(s) Performed: INTRAMEDULLARY (IM) NAIL INTERTROCHANTRIC (Right )     Patient location during evaluation: PACU Anesthesia Type: General Level of consciousness: awake and alert Pain management: pain level controlled Vital Signs Assessment: post-procedure vital signs reviewed and stable Respiratory status: spontaneous breathing, nonlabored ventilation, respiratory function stable and patient connected to nasal cannula oxygen Cardiovascular status: blood pressure returned to baseline and stable Postop Assessment: no apparent nausea or vomiting Anesthetic complications: no    Last Vitals:  Vitals:   06/22/19 1030 06/22/19 1045  BP: 113/73 101/87  Pulse:    Resp: 14 11  Temp:  36.7 C  SpO2: 95% 93%    Last Pain:  Vitals:   06/22/19 1045  TempSrc:   PainSc: 0-No pain                 [name]

## 2019-06-22 NOTE — Progress Notes (Signed)
Patient admitted by Dr. Hal Hope this morning.  Spoke with the patient over the phone after her surgery which she tolerated well.  She underwent intramedullary nailing of the right hip.  She does not have any complaints.  Her vital signs are stable.  Overall she feels she is doing well.  Patient tells me prior to that she is quite independent and able to drive.  At this point we will get patient PT/OT.  Focus on pain control and also provide bowel regimen as needed.  Orthopedic recommends Lovenox for 4 weeks.  Follow-up with them for suture removal in 2 weeks.  Weightbearing as tolerated with therapy.  Call with questions as needed. Patient can resume regular diet. Case discussed with patient's RN.  Gerlean Ren MD [facility]

## 2019-06-22 NOTE — Anesthesia Preprocedure Evaluation (Signed)
Anesthesia Evaluation  Patient identified by MRN, date of birth, ID band Patient awake    Reviewed: Allergy & Precautions, NPO status , Patient's Chart, lab work & pertinent test results  Airway Mallampati: II  TM Distance: >3 FB Neck ROM: Full    Dental no notable dental hx.    Pulmonary Current Smoker,    Pulmonary exam normal breath sounds clear to auscultation       Cardiovascular negative cardio ROS Normal cardiovascular exam Rhythm:Regular Rate:Normal     Neuro/Psych negative neurological ROS  negative psych ROS   GI/Hepatic negative GI ROS, Neg liver ROS,   Endo/Other  negative endocrine ROS  Renal/GU negative Renal ROS  negative genitourinary   Musculoskeletal negative musculoskeletal ROS (+)   Abdominal   Peds negative pediatric ROS (+)  Hematology negative hematology ROS (+)   Anesthesia Other Findings   Reproductive/Obstetrics negative OB ROS                             Anesthesia Physical Anesthesia Plan  ASA: II  Anesthesia Plan: General   Post-op Pain Management:    Induction: Intravenous  PONV Risk Score and Plan: 2 and Ondansetron, Dexamethasone and Treatment may vary due to age or medical condition  Airway Management Planned: Oral ETT  Additional Equipment:   Intra-op Plan:   Post-operative Plan: Extubation in OR  Informed Consent: I have reviewed the patients History and Physical, chart, labs and discussed the procedure including the risks, benefits and alternatives for the proposed anesthesia with the patient or authorized representative who has indicated his/her understanding and acceptance.   Dental advisory given  Plan Discussed with: CRNA and Surgeon  Anesthesia Plan Comments:         Anesthesia Quick Evaluation  

## 2019-06-22 NOTE — Consult Note (Signed)
Orthopaedic Trauma Service (OTS) Consult   Patient ID: Emily Horton MRN: [id number] DOB/AGE: [birth year] 73 y.o.   Reason for Consult:right hip fracture Referring Physician: Reece Levy, MD   HPI: Emily Horton is an 73 y.o. female who fell when her flip flop twisted while returning with her mail. Acute, severe pain that is sharp and stabbing. No tingling or weakness on the RLE or other injury.  Past Medical History:  Diagnosis Date  . Colon polyps 09-25-2007   Colonoscopy  . Depression   . Diverticulosis of colon (without mention of hemorrhage) 09-25-2007   Colonoscopy  . GERD (gastroesophageal reflux disease)   . Gynecological examination    sees Dr. Carren Rang   . Hyperlipidemia     Past Surgical History:  Procedure Laterality Date  . COLONOSCOPY  12/30/2015   per Dr. Henrene Pastor, benign  polyps, repeat in 5  yrs.   . ESOPHAGOGASTRODUODENOSCOPY  06-30-12   per Dr. Sharlett Iles, reflux but no Barretts seen     Family History  Problem Relation Age of Onset  . Breast cancer Maternal Grandmother   . Crohn's disease Father   . Colon polyps Father   . Stroke Paternal Grandfather     Social History:  reports that she has been smoking cigarettes. She has a 15.00 pack-year smoking history. She has never used smokeless tobacco. She reports current alcohol use. She reports that she does not use drugs.  Allergies:  Allergies  Allergen Reactions  . Eggs Or Egg-Derived Products     Hives   . Zoloft [Sertraline Hcl]     Headaches     Medications:  Prior to Admission:  Medications Prior to Admission  Medication Sig Dispense Refill Last Dose  . aspirin-acetaminophen-caffeine (EXCEDRIN MIGRAINE) 250-250-65 MG tablet Take 1 tablet by mouth every 6 (six) hours as needed for headache.   Past Month at Unknown time  . buPROPion (WELLBUTRIN XL) 300 MG 24 hr tablet TAKE 1 TABLET BY MOUTH EVERY MORNING (Patient taking differently: Take 300 mg by mouth every morning. ) 90 tablet 3  06/21/2019 at Unknown time  . cetirizine (ZYRTEC) 10 MG tablet TAKE 1 TABLET(10 MG) BY MOUTH DAILY (Patient taking differently: Take 10 mg by mouth every morning. ) 90 tablet 3 06/21/2019 at Unknown time  . EPINEPHrine 0.3 mg/0.3 mL IJ SOAJ injection Inject 0.3 mLs into the muscle daily as needed for anaphylaxis.    unk  . hyoscyamine (LEVSIN SL) 0.125 MG SL tablet Place 1 tablet (0.125 mg total) under the tongue every 4 (four) hours as needed. (Patient taking differently: Place 0.125 mg under the tongue every 4 (four) hours as needed for cramping. ) 60 tablet 11 Past Week at Unknown time  . omeprazole (PRILOSEC) 40 MG capsule TAKE 1 CAPSULE BY MOUTH TWICE DAILY (Patient taking differently: Take 40 mg by mouth See admin instructions. Take every morning can take a second time as needed for acid reflux) 180 capsule 0 06/21/2019 at Unknown time  . triamcinolone cream (KENALOG) 0.1 % Apply 1 application topically 2 (two) times daily. 453.6 g 3 06/21/2019 at Unknown time  . venlafaxine XR (EFFEXOR XR) 75 MG 24 hr capsule Take 1 capsule (75 mg total) by mouth daily with breakfast. 30 capsule 5 06/21/2019 at Unknown time    Results for orders placed or performed during the hospital encounter of 06/21/19 (from the past 48 hour(s))  Type and screen [place]     Status:  None   Collection Time: 06/21/19 11:00 PM  Result Value Ref Range   ABO/RH(D) O POS    Antibody Screen NEG    Sample Expiration      06/24/2019,2359 Performed at [facility], [place] [address], [place], [place] [address]   ABO/Rh     Status: None   Collection Time: 06/21/19 11:00 PM  Result Value Ref Range   ABO/RH(D)      O POS Performed at [place] [address] [place], [place] [address]   CBC     Status: Abnormal   Collection Time: 06/21/19 11:08 PM  Result Value Ref Range   WBC 19.7 (H) 4.0 - 10.5 K/uL   RBC 4.64 3.87 - 5.11 MIL/uL   Hemoglobin 14.3 12.0 - 15.0 g/dL   HCT 43.2 36.0 - 46.0 %    MCV 93.1 80.0 - 100.0 fL   MCH 30.8 26.0 - 34.0 pg   MCHC 33.1 30.0 - 36.0 g/dL   RDW 13.2 11.5 - 15.5 %   Platelets 304 150 - 400 K/uL   nRBC 0.0 0.0 - 0.2 %    Comment: Performed at [facility], [place] [address], [place], [place] [address]  Basic metabolic panel     Status: Abnormal   Collection Time: 06/21/19 11:08 PM  Result Value Ref Range   Sodium 132 (L) 135 - 145 mmol/L   Potassium 4.3 3.5 - 5.1 mmol/L   Chloride 100 98 - 111 mmol/L   CO2 23 22 - 32 mmol/L   Glucose, Bld 103 (H) 70 - 99 mg/dL   BUN 15 8 - 23 mg/dL   Creatinine, Ser 0.95 0.44 - 1.00 mg/dL   Calcium 9.3 8.9 - 10.3 mg/dL   GFR calc non Af Amer >60 >60 mL/min   GFR calc Af Amer >60 >60 mL/min   Anion gap 9 5 - 15    Comment: Performed at [facility], [place] [address] [place], [place] [address]  SARS Coronavirus 2 (CEPHEID - Performed in [place] hospital lab), Hosp Order     Status: None   Collection Time: 06/22/19 12:46 AM   Specimen: Nasopharyngeal Swab  Result Value Ref Range   SARS Coronavirus 2 NEGATIVE NEGATIVE    Comment: (NOTE) If result is NEGATIVE SARS-CoV-2 target nucleic acids are NOT DETECTED. The SARS-CoV-2 RNA is generally detectable in upper and lower  respiratory specimens during the acute phase of infection. The lowest  concentration of SARS-CoV-2 viral copies this assay can detect is 250  copies / mL. A negative result does not preclude SARS-CoV-2 infection  and should not be used as the sole basis for treatment or other  patient management decisions.  A negative result may occur with  improper specimen collection / handling, submission of specimen other  than nasopharyngeal swab, presence of viral mutation(s) within the  areas targeted by this assay, and inadequate number of viral copies  (<250 copies / mL). A negative result must be combined with clinical  observations, patient history, and epidemiological information. If result is POSITIVE SARS-CoV-2 target nucleic  acids are DETECTED. The SARS-CoV-2 RNA is generally detectable in upper and lower  respiratory specimens dur ing the acute phase of infection.  Positive  results are indicative of active infection with SARS-CoV-2.  Clinical  correlation with patient history and other diagnostic information is  necessary to determine patient infection status.  Positive results do  not rule out bacterial infection or  co-infection with other viruses. If result is PRESUMPTIVE POSTIVE SARS-CoV-2 nucleic acids MAY BE PRESENT.   A presumptive positive result was obtained on the submitted specimen  and confirmed on repeat testing.  While 2019 novel coronavirus  (SARS-CoV-2) nucleic acids may be present in the submitted sample  additional confirmatory testing may be necessary for epidemiological  and / or clinical management purposes  to differentiate between  SARS-CoV-2 and other Sarbecovirus currently known to infect humans.  If clinically indicated additional testing with an alternate test  methodology [phone]) is advised. The SARS-CoV-2 RNA is generally  detectable in upper and lower respiratory sp ecimens during the acute  phase of infection. The expected result is Negative. Fact Sheet for Patients:  StrictlyIdeas.no Fact Sheet for Healthcare Providers: BankingDealers.co.za This test is not yet approved or cleared by the [place] FDA and has been authorized for detection and/or diagnosis of SARS-CoV-2 by FDA under an Emergency Use Authorization (EUA).  This EUA will remain in effect (meaning this test can be used) for the duration of the COVID-19 declaration under Section 564(b)(1) of the Act, 21 U.S.C. section 360bbb-3(b)(1), unless the authorization is terminated or revoked sooner. Performed at [facility], [place] [address], [place], [place] [address]   Surgical pcr screen     Status: None   Collection Time: 06/22/19  2:19 AM   Specimen:  Nasal Mucosa; Nasal Swab  Result Value Ref Range   MRSA, PCR NEGATIVE NEGATIVE   Staphylococcus aureus NEGATIVE NEGATIVE    Comment: (NOTE) The Xpert SA Assay (FDA approved for NASAL specimens in patients 22 years of age and older), is one component of a comprehensive surveillance program. It is not intended to diagnose infection nor to guide or monitor treatment. Performed at [facility], [place] [address] [place], [place] [address]     Dg Chest 1 View  Result Date: 06/21/2019 CLINICAL DATA:  Fall, hip fracture EXAM: CHEST  1 VIEW COMPARISON:  07/06/2016 FINDINGS: Heart and mediastinal contours are within normal limits. No focal opacities or effusions. No acute bony abnormality. IMPRESSION: No active disease. Electronically Signed   By: Rolm Baptise M.D.   On: 06/21/2019 23:55   Dg Hip Unilat W Or Wo Pelvis 2-3 Views Right  Result Date: 06/21/2019 CLINICAL DATA:  Fall.  Right hip pain. EXAM: DG HIP (WITH OR WITHOUT PELVIS) 2-3V RIGHT COMPARISON:  None. FINDINGS: There is a proximal right femoral intertrochanteric fracture with varus angulation. No subluxation or dislocation. Joint spaces are maintained and symmetric. IMPRESSION: Right femoral intertrochanteric fracture with varus angulation. Electronically Signed   By: Rolm Baptise M.D.   On: 06/21/2019 23:54    ROS No recent fever, bleeding abnormalities, urologic dysfunction, GI problems, or weight gain.  Blood pressure 138/89, pulse 88, temperature 98.3 F (36.8 C), temperature source Oral, resp. rate 18, height 5\' 4"  (1.626 m), weight 87.1 kg, SpO2 96 %. Physical Exam NCAT RRR CTA S/ND/ND/ obese RLE No traumatic wounds, ecchymosis, or rash  Tender right hip, shortened and rotated  No knee or ankle effusion  Knee stable to varus/ valgus and anterior/posterior stress  Sens DPN, SPN, TN intact  Motor EHL, ext, flex, evers 5/5  DP 2+, PT 2+, No significant edema         Assessment/Plan: Right hip fracture for IM  nailing  I discussed with the patient the risks and benefits of surgery, including the possibility of infection, nerve injury, vessel injury, wound breakdown, arthritis, symptomatic hardware, DVT/  PE, loss of motion, malunion, nonunion, and need for further surgery among others.  [facility] acknowledged these risks and wished to proceed.  Weightbearing: WBAT RLE Insicional and dressing care: OK to remove dressings two days after discharge and leave open to air with dry gauze PRN Orthopedic device(s): Walker Showering: absolutely fine with gentle soap and water VTE prophylaxis: Lovenox 40mg  qd 20 days Pain control: Percocet Follow - up plan: 2-3 wks Contact information:  [place] MD, Ainsley Spinner PA   [place] , MD [facility], [facility] [phone]  06/22/2019, 8:08 AM  [facility] [place] [address] [phone] Domingo Sep (F)

## 2019-06-23 DIAGNOSIS — K219 Gastro-esophageal reflux disease without esophagitis: Secondary | ICD-10-CM

## 2019-06-23 DIAGNOSIS — D72829 Elevated white blood cell count, unspecified: Secondary | ICD-10-CM

## 2019-06-23 LAB — CBC
HCT: 33.6 % — ABNORMAL LOW (ref 36.0–46.0)
Hemoglobin: 11.1 g/dL — ABNORMAL LOW (ref 12.0–15.0)
MCH: 30.7 pg (ref 26.0–34.0)
MCHC: 33 g/dL (ref 30.0–36.0)
MCV: 93.1 fL (ref 80.0–100.0)
Platelets: 210 10*3/uL (ref 150–400)
RBC: 3.61 MIL/uL — ABNORMAL LOW (ref 3.87–5.11)
RDW: 13.3 % (ref 11.5–15.5)
WBC: 9.7 10*3/uL (ref 4.0–10.5)
nRBC: 0 % (ref 0.0–0.2)

## 2019-06-23 LAB — BASIC METABOLIC PANEL
Anion gap: 7 (ref 5–15)
BUN: 17 mg/dL (ref 8–23)
CO2: 23 mmol/L (ref 22–32)
Calcium: 8.6 mg/dL — ABNORMAL LOW (ref 8.9–10.3)
Chloride: 101 mmol/L (ref 98–111)
Creatinine, Ser: 0.93 mg/dL (ref 0.44–1.00)
GFR calc Af Amer: >60 mL/min (ref >60)
GFR calc non Af Amer: >60 mL/min (ref >60)
Glucose, Bld: 99 mg/dL (ref 70–99)
Potassium: 3.8 mmol/L (ref 3.5–5.1)
Sodium: 131 mmol/L — ABNORMAL LOW (ref 135–145)

## 2019-06-23 LAB — MAGNESIUM: Magnesium: 1.9 mg/dL (ref 1.7–2.4)

## 2019-06-23 MED ORDER — BUPROPION HCL ER (XL) 150 MG PO TB24
300.0000 mg | ORAL_TABLET | ORAL | Status: DC
  Administered 2019-06-23 – 2019-06-26 (×4): 300 mg via ORAL
  Filled 2019-06-23 (×6): qty 2

## 2019-06-23 MED ORDER — VENLAFAXINE HCL ER 75 MG PO CP24
75.0000 mg | ORAL_CAPSULE | Freq: Every day | ORAL | Status: DC
  Administered 2019-06-24 – 2019-06-26 (×3): 75 mg via ORAL
  Filled 2019-06-23 (×3): qty 1

## 2019-06-23 NOTE — Evaluation (Signed)
Physical Therapy Evaluation Patient Details Name: Emily Horton MRN: [id number] DOB: Jan 21, 1946 Today's Date: 06/23/2019   History of Present Illness  Emily Horton is a 73 y.o. female with with history of depression and GERD was brought to the ER after patient had a fall at home. Closed right intertrochanteric femur fracture, status post IM nail.  Clinical Impression  Patient is s/p above surgery presenting with functional limitations due to the deficits listed below (see PT Problem List). Tolerated bed mobility, pre-gait, exercise, and transfer training today. Min-Mod assist throughout but motivated to work hard with therapy. Pt is currently working on arrangements for home as she lives alone (possibly able to arrange for son or daughter to stay with her.) If she qualifies for a brief stay on CIR, she would likely go home with more independence, requiring less assistance from family and improved safety with mobility. Patient will benefit from skilled PT to increase their independence and safety with mobility to allow discharge to the venue listed below.       Follow Up Recommendations CIR(Pending CIR consult and family availability)    Equipment Recommendations  3in1 (PT)    Recommendations for Other Services Rehab consult;OT consult     Precautions / Restrictions Precautions Precautions: Fall Restrictions Weight Bearing Restrictions: Yes      Mobility  Bed Mobility Overal bed mobility: Needs Assistance Bed Mobility: Supine to Sit     Supine to sit: Mod assist;HOB elevated     General bed mobility comments: Mod assist for RLE support. Cues for technique  Transfers Overall transfer level: Needs assistance Equipment used: Rolling walker (2 wheeled) Transfers: Sit to/from [facility] Sit to Stand: Mod assist;From elevated surface Stand pivot transfers: Min assist       General transfer comment: Cues for hand placement and technique, bed lifted slightly for  boost. Mod assist to rise, Good control once upright. Min assist for walker control, max VC for weight shift and sequencing during pivot to recliner.  Ambulation/Gait                Stairs            Wheelchair Mobility    Modified Rankin (Stroke Patients Only)       Balance Overall balance assessment: Modified Independent Sitting-balance support: Feet supported;No upper extremity supported Sitting balance-Leahy Scale: Fair     Standing balance support: Single extremity supported Standing balance-Leahy Scale: Poor                               Pertinent Vitals/Pain Pain Assessment: Faces Faces Pain Scale: Hurts even more Pain Location: Rt hip Pain Descriptors / Indicators: Aching Pain Intervention(s): Limited activity within patient's tolerance;Monitored during session;Patient requesting pain meds-RN notified;Ice applied    Home Living Family/patient expects to be discharged to:: Private residence Living Arrangements: Alone Available Help at Discharge: Family;Available 24 hours/day(pending) Type of Home: House Home Access: Stairs to enter Entrance Stairs-Rails: None Entrance Stairs-Number of Steps: 3 Home Layout: Two level;Able to live on main level with bedroom/bathroom Home Equipment: Gilford Rile - 2 wheels      Prior Function Level of Independence: Independent               Hand Dominance   Dominant Hand: Right    Extremity/Trunk Assessment   Upper Extremity Assessment Upper Extremity Assessment: Defer to OT evaluation    Lower Extremity Assessment Lower Extremity Assessment: RLE deficits/detail  RLE Deficits / Details: Gross proximal weakness as expected post op       Communication   Communication: No difficulties  Cognition Arousal/Alertness: Awake/alert Behavior During Therapy: WFL for tasks assessed/performed Overall Cognitive Status: Within Functional Limits for tasks assessed                                         General Comments      Exercises General Exercises - Lower Extremity Ankle Circles/Pumps: AROM;Both;15 reps;Seated Quad Sets: Strengthening;Both;10 reps;Seated Gluteal Sets: Strengthening;Both;10 reps;Seated Short Arc Quad: Strengthening;Both;10 reps;Seated Hip ABduction/ADduction: AAROM;Right;5 reps;Supine   Assessment/Plan    PT Assessment Patient needs continued PT services  PT Problem List Decreased strength;Decreased range of motion;Decreased activity tolerance;Decreased balance;Decreased mobility;Decreased knowledge of use of DME;Pain       PT Treatment Interventions DME instruction;Gait training;Functional mobility training;Therapeutic activities;Therapeutic exercise;Balance training;Neuromuscular re-education;Stair training;Patient/family education    PT Goals (Current goals can be found in the Care Plan section)  Acute Rehab PT Goals Patient Stated Goal: Get well PT Goal Formulation: With patient Time For Goal Achievement: 07/07/19 Potential to Achieve Goals: Good    Frequency Min 3X/week   Barriers to discharge Decreased caregiver support Pending    Co-evaluation               AM-PAC PT "6 Clicks" Mobility  Outcome Measure Help needed turning from your back to your side while in a flat bed without using bedrails?: A Lot Help needed moving from lying on your back to sitting on the side of a flat bed without using bedrails?: A Lot Help needed moving to and from a bed to a chair (including a wheelchair)?: A Lot Help needed standing up from a chair using your arms (e.g., wheelchair or bedside chair)?: A Lot Help needed to walk in hospital room?: A Lot Help needed climbing 3-5 steps with a railing? : Total 6 Click Score: 11    End of Session Equipment Utilized During Treatment: Gait belt Activity Tolerance: Patient limited by pain Patient left: in chair;with call bell/phone within reach Nurse Communication: Mobility status PT Visit Diagnosis:  Unsteadiness on feet (R26.81);History of falling (Z91.81);Difficulty in walking, not elsewhere classified (R26.2);Pain Pain - Right/Left: Right Pain - part of body: Hip    Time: 0941-1010 PT Time Calculation (min) (ACUTE ONLY): 29 min   Charges:   PT Evaluation $PT Eval Moderate Complexity: 1 Mod PT Treatments $Therapeutic Activity: 8-22 mins        Elayne Snare, PT, DPT  Ellouise Newer 06/23/2019, 10:29 AM

## 2019-06-23 NOTE — Progress Notes (Signed)
Orthopedic progress note  Subjective: Patient reports pain as mild to moderate, improving.  Tolerating diet.  Urinating.  No CP, SOB.  Not yet mobilized.    Patient lives alone but has support from her daughter.  Previously ambulatory without aid.  Objective:   VITALS:   Vitals:   06/22/19 1410 06/22/19 2011 06/23/19 0323 06/23/19 0850  BP: 115/72 (!) 105/58 99/67 (!) 106/55  Pulse: 90 96 81 90  Resp: 15   16  Temp: 98.3 F (36.8 C) 98.5 F (36.9 C) 98.9 F (37.2 C) 98.4 F (36.9 C)  TempSrc: Oral Oral Oral Oral  SpO2: 94% 95% 98% 98%  Weight:      Height:       CBC Latest Ref Rng & Units 06/23/2019 06/22/2019 06/22/2019  WBC 4.0 - 10.5 K/uL 9.7 14.7(H) 16.7(H)  Hemoglobin 12.0 - 15.0 g/dL 11.1(L) 14.2 12.8  Hematocrit 36.0 - 46.0 % 33.6(L) 43.4 38.2  Platelets 150 - 400 K/uL 210 276 250   BMP Latest Ref Rng & Units 06/23/2019 06/22/2019 06/22/2019  Glucose 70 - 99 mg/dL 99 102(H) 147(H)  BUN 8 - 23 mg/dL 17 17 16   Creatinine 0.44 - 1.00 mg/dL 0.93 0.89 0.90  Sodium 135 - 145 mmol/L 131(L) 133(L) 131(L)  Potassium 3.5 - 5.1 mmol/L 3.8 4.1 4.5  Chloride 98 - 111 mmol/L 101 99 101  CO2 22 - 32 mmol/L 23 22 21(L)  Calcium 8.9 - 10.3 mg/dL 8.6(L) 9.2 8.8(L)   Intake/Output      07/17 0701 - 07/18 0700 07/18 0701 - 07/19 0700   P.O. 480    IV Piggyback     Total Intake(mL/kg) 480 (5.5)    Urine (mL/kg/hr) 1300 (0.6)    Blood 50    Total Output 1350    Net -870            Physical Exam: General: NAD.  Upright in bed.  Calm, conversant. Resp: No increased wob Cardio: regular rate and rhythm ABD soft Neurologically intact MSK Neurovascularly intact Sensation intact distally Intact pulses distally Dorsiflexion/Plantar flexion intact Incision: dressing C/D/I   Assessment: 1 Day Post-Op  S/P Procedure(s) (LRB): INTRAMEDULLARY (IM) NAIL INTERTROCHANTRIC (Right) by Dr. Marcelino Scot on 06/22/2019  Principal Problem:   Closed right hip fracture, initial encounter  [facility]) Active Problems:   Depression   GERD   Dyslipidemia   Closed right intertrochanteric femur fracture, status post IM nail Doing well postop day 1 Tolerating diet and voiding Pain control Not yet mobilized  Current smoker-smoking cessation discussed  Plan: Up with therapy Incentive Spirometry Apply ice PRN  Weightbearing: WBAT RLE Insicional and dressing care: Mepilex PRN Showering: Keep dressing dry VTE prophylaxis: Lovenox 40mg  qd, SCDs, ambulation Pain control: Minimize narcotics.  Tylenol, Norco. Follow - up plan: Dr. Marcelino Scot 1 to 2 weeks  Dispo: Therapy evaluations pending.  Discharge when ready medically and mobilized.   Emily Elizabeth Martensen III, PA-C 06/23/2019, 9:11 AM

## 2019-06-23 NOTE — Progress Notes (Signed)
Patient ID: Emily Horton, female   DOB: 1946/09/02, 73 y.o.   MRN: [id number]  PROGRESS NOTE    Emily Horton  YQI:[id number] DOB: [birth year] DOA: 06/21/2019 PCP: Laurey Morale, MD   Brief Narrative:  73 year old female with history of depression and GERD presented with fall at home and was found to have right hip fracture.  She underwent intramedullary nailing of the right hip on 06/22/2019 by orthopedics.  Assessment & Plan:   Closed right hip fracture status post mechanical fall  -Status post intramedullary nailing of the right hip on 06/22/2019.  Orthopedics following.  Wound care/activity as per orthopedics recommendations  -PT/OT eval.  Might need placement -Follow cultures -Pain management  Depression -Resume home regimen  GERD -On PPI  Leukocytosis -Reactive.  Resolved   DVT prophylaxis: Lovenox Code Status: DNR Family Communication: Spoke to patient Disposition Plan: Probable rehab placement Consultants: Orthopedics  Procedures: Intramedullary nailing of the right hip on 06/22/2019  Antimicrobials: Perioperative   Subjective: Patient seen and examined at bedside.  She denies any overnight fever, nausea or vomiting.  Complains of intermittent spasms around the right hip region.  Objective: Vitals:   06/22/19 1410 06/22/19 2011 06/23/19 0323 06/23/19 0850  BP: 115/72 (!) 105/58 99/67 (!) 106/55  Pulse: 90 96 81 90  Resp: 15   16  Temp: 98.3 F (36.8 C) 98.5 F (36.9 C) 98.9 F (37.2 C) 98.4 F (36.9 C)  TempSrc: Oral Oral Oral Oral  SpO2: 94% 95% 98% 98%  Weight:      Height:        Intake/Output Summary (Last 24 hours) at 06/23/2019 1105 Last data filed at 06/23/2019 0930 Gross per 24 hour  Intake 720 ml  Output 900 ml  Net -180 ml   Filed Weights   06/21/19 2228  Weight: 87.1 kg    Examination:  General exam: Appears calm and comfortable  Respiratory system: Bilateral decreased breath sounds at bases Cardiovascular system: S1 & S2 heard, Rate  controlled Gastrointestinal system: Abdomen is nondistended, soft and nontender. Normal bowel sounds heard. Extremities: No cyanosis, clubbing, edema    Data Reviewed: I have personally reviewed following labs and imaging studies  CBC: Recent Labs  Lab 06/19/19 1121 06/21/19 2308 06/22/19 1513 06/22/19 1633 06/23/19 0359  WBC 7.8 19.7* 16.7* 14.7* 9.7  NEUTROABS 4.5  --   --  12.4*  --   HGB 14.5 14.3 12.8 14.2 11.1*  HCT 43.8 43.2 38.2 43.4 33.6*  MCV 93.1 93.1 93.4 93.7 93.1  PLT 284.0 304 250 276 387   Basic Metabolic Panel: Recent Labs  Lab 06/19/19 1121 06/21/19 2308 06/22/19 1513 06/22/19 1633 06/23/19 0359  NA 137 132* 131* 133* 131*  K 4.2 4.3 4.5 4.1 3.8  CL 102 100 101 99 101  CO2 26 23 21* 22 23  GLUCOSE 95 103* 147* 102* 99  BUN 14 15 16 17 17   CREATININE 0.91 0.95 0.90 0.89 0.93  CALCIUM 9.5 9.3 8.8* 9.2 8.6*  MG  --   --   --   --  1.9   GFR: Estimated Creatinine Clearance: 60.2 mL/min (by C-G formula based on SCr of 0.93 mg/dL). Liver Function Tests: Recent Labs  Lab 06/19/19 1121  AST 8  ALT 6  ALKPHOS 91  BILITOT 0.6  PROT 6.4  ALBUMIN 4.4   No results for input(s): LIPASE, AMYLASE in the last 168 hours. No results for input(s): AMMONIA in the last 168 hours. Coagulation Profile: Recent  Labs  Lab 06/22/19 1633  INR 1.1   Cardiac Enzymes: No results for input(s): CKTOTAL, CKMB, CKMBINDEX, TROPONINI in the last 168 hours. BNP (last 3 results) No results for input(s): PROBNP in the last 8760 hours. HbA1C: No results for input(s): HGBA1C in the last 72 hours. CBG: No results for input(s): GLUCAP in the last 168 hours. Lipid Profile: No results for input(s): CHOL, HDL, LDLCALC, TRIG, CHOLHDL, LDLDIRECT in the last 72 hours. Thyroid Function Tests: No results for input(s): TSH, T4TOTAL, FREET4, T3FREE, THYROIDAB in the last 72 hours. Anemia Panel: No results for input(s): VITAMINB12, FOLATE, FERRITIN, TIBC, IRON, RETICCTPCT in the  last 72 hours. Sepsis Labs: No results for input(s): PROCALCITON, LATICACIDVEN in the last 168 hours.  Recent Results (from the past 240 hour(s))  SARS Coronavirus 2 (CEPHEID - Performed in [place] hospital lab), Hosp Order     Status: None   Collection Time: 06/22/19 12:46 AM   Specimen: Nasopharyngeal Swab  Result Value Ref Range Status   SARS Coronavirus 2 NEGATIVE NEGATIVE Final    Comment: (NOTE) If result is NEGATIVE SARS-CoV-2 target nucleic acids are NOT DETECTED. The SARS-CoV-2 RNA is generally detectable in upper and lower  respiratory specimens during the acute phase of infection. The lowest  concentration of SARS-CoV-2 viral copies this assay can detect is 250  copies / mL. A negative result does not preclude SARS-CoV-2 infection  and should not be used as the sole basis for treatment or other  patient management decisions.  A negative result may occur with  improper specimen collection / handling, submission of specimen other  than nasopharyngeal swab, presence of viral mutation(s) within the  areas targeted by this assay, and inadequate number of viral copies  (<250 copies / mL). A negative result must be combined with clinical  observations, patient history, and epidemiological information. If result is POSITIVE SARS-CoV-2 target nucleic acids are DETECTED. The SARS-CoV-2 RNA is generally detectable in upper and lower  respiratory specimens dur ing the acute phase of infection.  Positive  results are indicative of active infection with SARS-CoV-2.  Clinical  correlation with patient history and other diagnostic information is  necessary to determine patient infection status.  Positive results do  not rule out bacterial infection or co-infection with other viruses. If result is PRESUMPTIVE POSTIVE SARS-CoV-2 nucleic acids MAY BE PRESENT.   A presumptive positive result was obtained on the submitted specimen  and confirmed on repeat testing.  While 2019 novel  coronavirus  (SARS-CoV-2) nucleic acids may be present in the submitted sample  additional confirmatory testing may be necessary for epidemiological  and / or clinical management purposes  to differentiate between  SARS-CoV-2 and other Sarbecovirus currently known to infect humans.  If clinically indicated additional testing with an alternate test  methodology [phone]) is advised. The SARS-CoV-2 RNA is generally  detectable in upper and lower respiratory sp ecimens during the acute  phase of infection. The expected result is Negative. Fact Sheet for Patients:  StrictlyIdeas.no Fact Sheet for Healthcare Providers: BankingDealers.co.za This test is not yet approved or cleared by the [place] FDA and has been authorized for detection and/or diagnosis of SARS-CoV-2 by FDA under an Emergency Use Authorization (EUA).  This EUA will remain in effect (meaning this test can be used) for the duration of the COVID-19 declaration under Section 564(b)(1) of the Act, 21 U.S.C. section 360bbb-3(b)(1), unless the authorization is terminated or revoked sooner. Performed at [facility], [place]  [address], [place], [place] [address]   Surgical pcr screen     Status: None   Collection Time: 06/22/19  2:19 AM   Specimen: Nasal Mucosa; Nasal Swab  Result Value Ref Range Status   MRSA, PCR NEGATIVE NEGATIVE Final   Staphylococcus aureus NEGATIVE NEGATIVE Final    Comment: (NOTE) The Xpert SA Assay (FDA approved for NASAL specimens in patients 9 years of age and older), is one component of a comprehensive surveillance program. It is not intended to diagnose infection nor to guide or monitor treatment. Performed at [facility], [place] [address], [place],  [address]          Radiology Studies: Dg Chest 1 View  Result Date: 06/21/2019 CLINICAL DATA:  Fall, hip fracture EXAM: CHEST  1 VIEW COMPARISON:  07/06/2016 FINDINGS: Heart  and mediastinal contours are within normal limits. No focal opacities or effusions. No acute bony abnormality. IMPRESSION: No active disease. Electronically Signed   By: Rolm Baptise M.D.   On: 06/21/2019 23:55   Pelvis Portable  Result Date: 06/22/2019 CLINICAL DATA:  Status postoperative fixation of a right intertrochanteric fracture. EXAM: PORTABLE PELVIS 1-2 VIEWS COMPARISON:  Yesterday. FINDINGS: Interval screw and rod fixation of the previously demonstrated right intertrochanteric fracture. The distal portion of the rod is not included. Improved position and alignment of the fragments with some residual distraction and displacement. No new fracture or dislocation. Lower lumbar spine degenerative changes. IMPRESSION: Hardware fixation of the previously demonstrated right intertrochanteric fracture with improved position and alignment. Electronically Signed   By: Claudie Revering M.D.   On: 06/22/2019 17:26   Dg C-arm 1-60 Min  Result Date: 06/22/2019 CLINICAL DATA:  Portable operative imaging for ORIF of a proximal right femur fracture EXAM: RIGHT FEMUR 2 VIEWS; DG C-ARM 61-120 MIN COMPARISON:  06/21/2019 FINDINGS: Imaging shows placement of an intramedullary rod supporting a compression screw, reducing primary fracture components into near anatomic alignment. The orthopedic hardware appears well seated and well-positioned. No evidence of an operative complication. IMPRESSION: Well aligned proximal right femur fracture following ORIF. Electronically Signed   By: Lajean Manes M.D.   On: 06/22/2019 11:19   Dg Hip Operative Unilat With Pelvis Left  Result Date: 06/22/2019 CLINICAL DATA:  Comparison of left hip of fractured right hip. EXAM: OPERATIVE left HIP (WITH PELVIS IF PERFORMED) 1 VIEWS TECHNIQUE: Fluoroscopic spot image(s) were submitted for interpretation post-operatively. FLUOROSCOPY TIME:  2 seconds. COMPARISON:  None. FINDINGS: One fluoroscopic image of the left hip was obtained. The left hip  appears normal. IMPRESSION: Fluoroscopic evaluation of left hip was performed. Electronically Signed   By: Marijo Conception M.D.   On: 06/22/2019 10:42   Dg Hip Unilat W Or Wo Pelvis 2-3 Views Right  Result Date: 06/21/2019 CLINICAL DATA:  Fall.  Right hip pain. EXAM: DG HIP (WITH OR WITHOUT PELVIS) 2-3V RIGHT COMPARISON:  None. FINDINGS: There is a proximal right femoral intertrochanteric fracture with varus angulation. No subluxation or dislocation. Joint spaces are maintained and symmetric. IMPRESSION: Right femoral intertrochanteric fracture with varus angulation. Electronically Signed   By: Rolm Baptise M.D.   On: 06/21/2019 23:54   Dg Femur, Min 2 Views Right  Result Date: 06/22/2019 CLINICAL DATA:  Portable operative imaging for ORIF of a proximal right femur fracture EXAM: RIGHT FEMUR 2 VIEWS; DG C-ARM 61-120 MIN COMPARISON:  06/21/2019 FINDINGS: Imaging shows placement of an intramedullary rod supporting a compression screw, reducing primary fracture components into near anatomic alignment. The orthopedic  hardware appears well seated and well-positioned. No evidence of an operative complication. IMPRESSION: Well aligned proximal right femur fracture following ORIF. Electronically Signed   By: Lajean Manes M.D.   On: 06/22/2019 11:19   Dg Femur Port, Min 2 Views Right  Result Date: 06/22/2019 CLINICAL DATA:  Operative fixation of a right femur intertrochanteric fracture. EXAM: RIGHT FEMUR PORTABLE 2 VIEW COMPARISON:  Pelvis radiograph obtained at the same time. Right hip radiographs obtained yesterday FINDINGS: Intramedullary rod and screw fixation of the previously demonstrated comminuted right intertrochanteric fracture with improved position and alignment. There is some residual distraction and displacement of the fragments. Right knee degenerative spur formation is noted. No additional fractures are seen IMPRESSION: Hardware fixation of the previously demonstrated comminuted right  intertrochanteric fracture with improved position and alignment. Electronically Signed   By: Claudie Revering M.D.   On: 06/22/2019 17:27        Scheduled Meds: . acetaminophen  650 mg Oral Q6H  . docusate sodium  100 mg Oral BID  . enoxaparin (LOVENOX) injection  40 mg Subcutaneous Q24H  . feeding supplement (ENSURE ENLIVE)  237 mL Oral BID BM  . pantoprazole  40 mg Oral Daily   Continuous Infusions: . methocarbamol (ROBAXIN) IV 500 mg (06/22/19 0550)     LOS: 1 day        Aline August, MD [facility] 06/23/2019, 11:05 AM

## 2019-06-23 NOTE — Plan of Care (Signed)
Acute rehab goals established. 

## 2019-06-23 NOTE — Progress Notes (Signed)
Rehab Admissions Coordinator Note:  Patient was screened by Michel Santee for appropriateness for an Inpatient Acute Rehab Consult.  At this time, we are recommending Inpatient Rehab consult. Please place a consult order if pt would like to be considered.   Michel Santee 06/23/2019, 5:54 PM  I can be reached at [phone].

## 2019-06-23 NOTE — Plan of Care (Signed)
  Problem: Education: Goal: Knowledge of General Education information will improve Description: Including pain rating scale, medication(s)/side effects and non-pharmacologic comfort measures Outcome: Progressing   Problem: Clinical Measurements: Goal: Ability to maintain clinical measurements within normal limits will improve Outcome: Progressing   

## 2019-06-24 DIAGNOSIS — F329 Major depressive disorder, single episode, unspecified: Secondary | ICD-10-CM

## 2019-06-24 LAB — BASIC METABOLIC PANEL
Anion gap: 8 (ref 5–15)
BUN: 15 mg/dL (ref 8–23)
CO2: 24 mmol/L (ref 22–32)
Calcium: 8.7 mg/dL — ABNORMAL LOW (ref 8.9–10.3)
Chloride: 102 mmol/L (ref 98–111)
Creatinine, Ser: 0.81 mg/dL (ref 0.44–1.00)
GFR calc Af Amer: >60 mL/min (ref >60)
GFR calc non Af Amer: >60 mL/min (ref >60)
Glucose, Bld: 116 mg/dL — ABNORMAL HIGH (ref 70–99)
Potassium: 4.1 mmol/L (ref 3.5–5.1)
Sodium: 134 mmol/L — ABNORMAL LOW (ref 135–145)

## 2019-06-24 LAB — MAGNESIUM: Magnesium: 2 mg/dL (ref 1.7–2.4)

## 2019-06-24 LAB — CBC
HCT: 33.2 % — ABNORMAL LOW (ref 36.0–46.0)
Hemoglobin: 10.9 g/dL — ABNORMAL LOW (ref 12.0–15.0)
MCH: 30.8 pg (ref 26.0–34.0)
MCHC: 32.8 g/dL (ref 30.0–36.0)
MCV: 93.8 fL (ref 80.0–100.0)
Platelets: 198 10*3/uL (ref 150–400)
RBC: 3.54 MIL/uL — ABNORMAL LOW (ref 3.87–5.11)
RDW: 13.3 % (ref 11.5–15.5)
WBC: 9.8 10*3/uL (ref 4.0–10.5)
nRBC: 0 % (ref 0.0–0.2)

## 2019-06-24 NOTE — Plan of Care (Signed)
  Problem: Education: Goal: Knowledge of General Education information will improve Description Including pain rating scale, medication(s)/side effects and non-pharmacologic comfort measures Outcome: Progressing   

## 2019-06-24 NOTE — Progress Notes (Signed)
Orthopedic progress note  Subjective: Patient reports pain as moderate, more sore today.  Tolerating diet.  Urinating.  No CP, SOB.  Early mobilization.   Patient lives alone but has support from her daughter.  Previously ambulatory without aid.  Possible short stay in CIR before d/c home w/ HHPT to avoid SNF.  Objective:   VITALS:   Vitals:   06/23/19 0850 06/23/19 1437 06/23/19 1927 06/24/19 0538  BP: (!) 106/55 102/62 106/65 115/66  Pulse: 90 84 92 93  Resp: 16 16    Temp: 98.4 F (36.9 C) 97.9 F (36.6 C) 98.7 F (37.1 C) 98.8 F (37.1 C)  TempSrc: Oral Oral Oral Oral  SpO2: 98% 97% 96% 99%  Weight:      Height:       CBC Latest Ref Rng & Units 06/24/2019 06/23/2019 06/22/2019  WBC 4.0 - 10.5 K/uL 9.8 9.7 14.7(H)  Hemoglobin 12.0 - 15.0 g/dL 10.9(L) 11.1(L) 14.2  Hematocrit 36.0 - 46.0 % 33.2(L) 33.6(L) 43.4  Platelets 150 - 400 K/uL 198 210 276   BMP Latest Ref Rng & Units 06/24/2019 06/23/2019 06/22/2019  Glucose 70 - 99 mg/dL 116(H) 99 102(H)  BUN 8 - 23 mg/dL 15 17 17   Creatinine 0.44 - 1.00 mg/dL 0.81 0.93 0.89  Sodium 135 - 145 mmol/L 134(L) 131(L) 133(L)  Potassium 3.5 - 5.1 mmol/L 4.1 3.8 4.1  Chloride 98 - 111 mmol/L 102 101 99  CO2 22 - 32 mmol/L 24 23 22   Calcium 8.9 - 10.3 mg/dL 8.7(L) 8.6(L) 9.2   Intake/Output      07/18 0701 - 07/19 0700 07/19 0701 - 07/20 0700   P.O. 480    Total Intake(mL/kg) 480 (5.5)    Urine (mL/kg/hr)     Blood     Total Output     Net +480         Urine Occurrence 6 x    Stool Occurrence 1 x       Physical Exam: General: NAD.  Upright in bed w/ breakfast tray.  Calm, conversant. MSK Neurovascularly intact Sensation intact distally Feet warm Dorsiflexion/Plantar flexion intact Incision: dressing C/D/I   Assessment: 2 Days Post-Op  S/P Procedure(s) (LRB): INTRAMEDULLARY (IM) NAIL INTERTROCHANTRIC (Right) by Dr. Marcelino Scot on 06/22/2019  Principal Problem:   Closed right hip fracture, initial encounter [facility]) Active  Problems:   Depression   GERD   Dyslipidemia   Closed right intertrochanteric femur fracture, status post IM nail Doing well postop day 2 Tolerating diet and voiding Pain controlled Early mobilization  Current smoker-smoking cessation discussed  Plan: Up with therapy Incentive Spirometry Apply ice PRN  Weightbearing: WBAT RLE Insicional and dressing care: Mepilex PRN Showering: Keep dressing dry VTE prophylaxis: Lovenox 40mg  qd, SCDs, ambulation Pain control: Minimize narcotics.  Tylenol, Norco. Follow - up plan: Dr. Marcelino Scot 1 to 2 weeks  Dispo: Therapy evaluations ongoing.  Discharge when ready medically and mobilized.   Possible short stay in CIR before d/c home w/ HHPT to avoid SNF.   Emily Elizabeth Martensen III, PA-C 06/24/2019, 8:22 AM

## 2019-06-24 NOTE — Progress Notes (Signed)
Patient ID: [name], female   DOB: Everette 24, 1947, 73 y.o.   MRN: [id number]  PROGRESS NOTE    XIAO GRAUL  LTJ:[id number] DOB: December 30, 1945 DOA: 06/21/2019 PCP: Laurey Morale, MD   Brief Narrative:  73 year old female with history of depression and GERD presented with fall at home and was found to have right hip fracture.  She underwent intramedullary nailing of the right hip on 06/22/2019 by orthopedics.  Assessment & Plan:   Closed right hip fracture status post mechanical fall  -Status post intramedullary nailing of the right hip on 06/22/2019.  Orthopedics following.  Wound care/activity as per orthopedics recommendations  -PT recommending CIR placement.  Consulted CIR. -Fall precautions -Pain management  Depression -Continue bupropion and venlafaxine  GERD -On PPI  Leukocytosis -Reactive.  Resolved   DVT prophylaxis: Lovenox Code Status: DNR Family Communication: Spoke to patient Disposition Plan: Probable rehab placement once bed is available Consultants: Orthopedics  Procedures: Intramedullary nailing of the right hip on 06/22/2019  Antimicrobials: Perioperative   Subjective: Patient seen and examined at bedside.  No overnight fever, nausea or vomiting reported.  Complains of right hip pain.  Objective: Vitals:   06/23/19 0850 06/23/19 1437 06/23/19 1927 06/24/19 0538  BP: (!) 106/55 102/62 106/65 115/66  Pulse: 90 84 92 93  Resp: 16 16    Temp: 98.4 F (36.9 C) 97.9 F (36.6 C) 98.7 F (37.1 C) 98.8 F (37.1 C)  TempSrc: Oral Oral Oral Oral  SpO2: 98% 97% 96% 99%  Weight:      Height:        Intake/Output Summary (Last 24 hours) at 06/24/2019 0751 Last data filed at 06/23/2019 1837 Gross per 24 hour  Intake 480 ml  Output -  Net 480 ml   Filed Weights   06/21/19 2228  Weight: 87.1 kg    Examination:  General exam: Appears calm and comfortable.  No distress Respiratory system: Bilateral decreased breath sounds at bases.  No wheezing  Cardiovascular system: Rate controlled, S1-S2 heard Gastrointestinal system: Abdomen is nondistended, soft and nontender. Normal bowel sounds heard. Extremities: No cyanosis, clubbing, edema    Data Reviewed: I have personally reviewed following labs and imaging studies  CBC: Recent Labs  Lab 06/19/19 1121 06/21/19 2308 06/22/19 1513 06/22/19 1633 06/23/19 0359 06/24/19 0338  WBC 7.8 19.7* 16.7* 14.7* 9.7 9.8  NEUTROABS 4.5  --   --  12.4*  --   --   HGB 14.5 14.3 12.8 14.2 11.1* 10.9*  HCT 43.8 43.2 38.2 43.4 33.6* 33.2*  MCV 93.1 93.1 93.4 93.7 93.1 93.8  PLT 284.0 304 250 276 210 076   Basic Metabolic Panel: Recent Labs  Lab 06/21/19 2308 06/22/19 1513 06/22/19 1633 06/23/19 0359 06/24/19 0338  NA 132* 131* 133* 131* 134*  K 4.3 4.5 4.1 3.8 4.1  CL 100 101 99 101 102  CO2 23 21* 22 23 24   GLUCOSE 103* 147* 102* 99 116*  BUN 15 16 17 17 15   CREATININE 0.95 0.90 0.89 0.93 0.81  CALCIUM 9.3 8.8* 9.2 8.6* 8.7*  MG  --   --   --  1.9 2.0   GFR: Estimated Creatinine Clearance: 69.1 mL/min (by C-G formula based on SCr of 0.81 mg/dL). Liver Function Tests: Recent Labs  Lab 06/19/19 1121  AST 8  ALT 6  ALKPHOS 91  BILITOT 0.6  PROT 6.4  ALBUMIN 4.4   No results for input(s): LIPASE, AMYLASE in the last 168 hours. No results for  input(s): AMMONIA in the last 168 hours. Coagulation Profile: Recent Labs  Lab 06/22/19 1633  INR 1.1   Cardiac Enzymes: No results for input(s): CKTOTAL, CKMB, CKMBINDEX, TROPONINI in the last 168 hours. BNP (last 3 results) No results for input(s): PROBNP in the last 8760 hours. HbA1C: No results for input(s): HGBA1C in the last 72 hours. CBG: No results for input(s): GLUCAP in the last 168 hours. Lipid Profile: No results for input(s): CHOL, HDL, LDLCALC, TRIG, CHOLHDL, LDLDIRECT in the last 72 hours. Thyroid Function Tests: No results for input(s): TSH, T4TOTAL, FREET4, T3FREE, THYROIDAB in the last 72 hours. Anemia  Panel: No results for input(s): VITAMINB12, FOLATE, FERRITIN, TIBC, IRON, RETICCTPCT in the last 72 hours. Sepsis Labs: No results for input(s): PROCALCITON, LATICACIDVEN in the last 168 hours.  Recent Results (from the past 240 hour(s))  SARS Coronavirus 2 (CEPHEID - Performed in [facility] hospital lab), Hosp Order     Status: None   Collection Time: 06/22/19 12:46 AM   Specimen: Nasopharyngeal Swab  Result Value Ref Range Status   SARS Coronavirus 2 NEGATIVE NEGATIVE Final    Comment: (NOTE) If result is NEGATIVE SARS-CoV-2 target nucleic acids are NOT DETECTED. The SARS-CoV-2 RNA is generally detectable in upper and lower  respiratory specimens during the acute phase of infection. The lowest  concentration of SARS-CoV-2 viral copies this assay can detect is 250  copies / mL. A negative result does not preclude SARS-CoV-2 infection  and should not be used as the sole basis for treatment or other  patient management decisions.  A negative result may occur with  improper specimen collection / handling, submission of specimen other  than nasopharyngeal swab, presence of viral mutation(s) within the  areas targeted by this assay, and inadequate number of viral copies  (<250 copies / mL). A negative result must be combined with clinical  observations, patient history, and epidemiological information. If result is POSITIVE SARS-CoV-2 target nucleic acids are DETECTED. The SARS-CoV-2 RNA is generally detectable in upper and lower  respiratory specimens dur ing the acute phase of infection.  Positive  results are indicative of active infection with SARS-CoV-2.  Clinical  correlation with patient history and other diagnostic information is  necessary to determine patient infection status.  Positive results do  not rule out bacterial infection or co-infection with other viruses. If result is PRESUMPTIVE POSTIVE SARS-CoV-2 nucleic acids MAY BE PRESENT.   A presumptive positive result was  obtained on the submitted specimen  and confirmed on repeat testing.  While 2019 novel coronavirus  (SARS-CoV-2) nucleic acids may be present in the submitted sample  additional confirmatory testing may be necessary for epidemiological  and / or clinical management purposes  to differentiate between  SARS-CoV-2 and other Sarbecovirus currently known to infect humans.  If clinically indicated additional testing with an alternate test  methodology [phone]) is advised. The SARS-CoV-2 RNA is generally  detectable in upper and lower respiratory sp ecimens during the acute  phase of infection. The expected result is Negative. Fact Sheet for Patients:  StrictlyIdeas.no Fact Sheet for Healthcare Providers: BankingDealers.co.za This test is not yet approved or cleared by the [place] FDA and has been authorized for detection and/or diagnosis of SARS-CoV-2 by FDA under an Emergency Use Authorization (EUA).  This EUA will remain in effect (meaning this test can be used) for the duration of the COVID-19 declaration under Section 564(b)(1) of the Act, 21 U.S.C. section 360bbb-3(b)(1), unless the authorization is terminated or revoked  sooner. Performed at [facility], [place] [address], [place], [place] [address]   Surgical pcr screen     Status: None   Collection Time: 06/22/19  2:19 AM   Specimen: Nasal Mucosa; Nasal Swab  Result Value Ref Range Status   MRSA, PCR NEGATIVE NEGATIVE Final   Staphylococcus aureus NEGATIVE NEGATIVE Final    Comment: (NOTE) The Xpert SA Assay (FDA approved for NASAL specimens in patients 57 years of age and older), is one component of a comprehensive surveillance program. It is not intended to diagnose infection nor to guide or monitor treatment. Performed at [facility], [place] [address] [place], [place] [address]          Radiology Studies: Pelvis Portable  Result Date: 06/22/2019  CLINICAL DATA:  Status postoperative fixation of a right intertrochanteric fracture. EXAM: PORTABLE PELVIS 1-2 VIEWS COMPARISON:  Yesterday. FINDINGS: Interval screw and rod fixation of the previously demonstrated right intertrochanteric fracture. The distal portion of the rod is not included. Improved position and alignment of the fragments with some residual distraction and displacement. No new fracture or dislocation. Lower lumbar spine degenerative changes. IMPRESSION: Hardware fixation of the previously demonstrated right intertrochanteric fracture with improved position and alignment. Electronically Signed   By: Claudie Revering M.D.   On: 06/22/2019 17:26   Dg C-arm 1-60 Min  Result Date: 06/22/2019 CLINICAL DATA:  Portable operative imaging for ORIF of a proximal right femur fracture EXAM: RIGHT FEMUR 2 VIEWS; DG C-ARM 61-120 MIN COMPARISON:  06/21/2019 FINDINGS: Imaging shows placement of an intramedullary rod supporting a compression screw, reducing primary fracture components into near anatomic alignment. The orthopedic hardware appears well seated and well-positioned. No evidence of an operative complication. IMPRESSION: Well aligned proximal right femur fracture following ORIF. Electronically Signed   By: Lajean Manes M.D.   On: 06/22/2019 11:19   Dg Hip Operative Unilat With Pelvis Left  Result Date: 06/22/2019 CLINICAL DATA:  Comparison of left hip of fractured right hip. EXAM: OPERATIVE left HIP (WITH PELVIS IF PERFORMED) 1 VIEWS TECHNIQUE: Fluoroscopic spot image(s) were submitted for interpretation post-operatively. FLUOROSCOPY TIME:  2 seconds. COMPARISON:  None. FINDINGS: One fluoroscopic image of the left hip was obtained. The left hip appears normal. IMPRESSION: Fluoroscopic evaluation of left hip was performed. Electronically Signed   By: Marijo Conception M.D.   On: 06/22/2019 10:42   Dg Femur, Min 2 Views Right  Result Date: 06/22/2019 CLINICAL DATA:  Portable operative imaging for  ORIF of a proximal right femur fracture EXAM: RIGHT FEMUR 2 VIEWS; DG C-ARM 61-120 MIN COMPARISON:  06/21/2019 FINDINGS: Imaging shows placement of an intramedullary rod supporting a compression screw, reducing primary fracture components into near anatomic alignment. The orthopedic hardware appears well seated and well-positioned. No evidence of an operative complication. IMPRESSION: Well aligned proximal right femur fracture following ORIF. Electronically Signed   By: Lajean Manes M.D.   On: 06/22/2019 11:19   Dg Femur Port, Min 2 Views Right  Result Date: 06/22/2019 CLINICAL DATA:  Operative fixation of a right femur intertrochanteric fracture. EXAM: RIGHT FEMUR PORTABLE 2 VIEW COMPARISON:  Pelvis radiograph obtained at the same time. Right hip radiographs obtained yesterday FINDINGS: Intramedullary rod and screw fixation of the previously demonstrated comminuted right intertrochanteric fracture with improved position and alignment. There is some residual distraction and displacement of the fragments. Right knee degenerative spur formation is noted. No additional fractures are seen IMPRESSION: Hardware fixation of the previously demonstrated comminuted right intertrochanteric  fracture with improved position and alignment. Electronically Signed   By: Claudie Revering M.D.   On: 06/22/2019 17:27        Scheduled Meds: . acetaminophen  650 mg Oral Q6H  . buPROPion  300 mg Oral BH-q7a  . docusate sodium  100 mg Oral BID  . enoxaparin (LOVENOX) injection  40 mg Subcutaneous Q24H  . feeding supplement (ENSURE ENLIVE)  237 mL Oral BID BM  . pantoprazole  40 mg Oral Daily  . venlafaxine XR  75 mg Oral Q breakfast   Continuous Infusions: . methocarbamol (ROBAXIN) IV 500 mg (06/22/19 0550)     LOS: 2 days        Aline August, MD [facility] 06/24/2019, 7:51 AM

## 2019-06-24 NOTE — Plan of Care (Signed)
  Problem: Pain Managment: Goal: General experience of comfort will improve Outcome: Progressing   

## 2019-06-25 ENCOUNTER — Encounter (HOSPITAL_COMMUNITY): Payer: Self-pay | Admitting: Orthopedic Surgery

## 2019-06-25 LAB — BASIC METABOLIC PANEL
Anion gap: 9 (ref 5–15)
BUN: 19 mg/dL (ref 8–23)
CO2: 24 mmol/L (ref 22–32)
Calcium: 8.9 mg/dL (ref 8.9–10.3)
Chloride: 104 mmol/L (ref 98–111)
Creatinine, Ser: 0.83 mg/dL (ref 0.44–1.00)
GFR calc Af Amer: >60 mL/min (ref >60)
GFR calc non Af Amer: >60 mL/min (ref >60)
Glucose, Bld: 111 mg/dL — ABNORMAL HIGH (ref 70–99)
Potassium: 3.8 mmol/L (ref 3.5–5.1)
Sodium: 137 mmol/L (ref 135–145)

## 2019-06-25 LAB — CBC
HCT: 31.2 % — ABNORMAL LOW (ref 36.0–46.0)
Hemoglobin: 10.3 g/dL — ABNORMAL LOW (ref 12.0–15.0)
MCH: 31.1 pg (ref 26.0–34.0)
MCHC: 33 g/dL (ref 30.0–36.0)
MCV: 94.3 fL (ref 80.0–100.0)
Platelets: 205 10*3/uL (ref 150–400)
RBC: 3.31 MIL/uL — ABNORMAL LOW (ref 3.87–5.11)
RDW: 13.5 % (ref 11.5–15.5)
WBC: 9.7 10*3/uL (ref 4.0–10.5)
nRBC: 0 % (ref 0.0–0.2)

## 2019-06-25 LAB — MAGNESIUM: Magnesium: 1.9 mg/dL (ref 1.7–2.4)

## 2019-06-25 LAB — VITAMIN D 25 HYDROXY (VIT D DEFICIENCY, FRACTURES): Vit D, 25-Hydroxy: 37.4 ng/mL (ref 30.0–100.0)

## 2019-06-25 NOTE — Plan of Care (Signed)
  Problem: Education: Goal: Knowledge of General Education information will improve Description Including pain rating scale, medication(s)/side effects and non-pharmacologic comfort measures Outcome: Progressing   

## 2019-06-25 NOTE — Progress Notes (Signed)
Patient ID: Emily Horton, female   DOB: 03-21-1946, 73 y.o.   MRN: [id number]  PROGRESS NOTE    DELANE STALLING  FMB:[id number] DOB: [birth year] DOA: 06/21/2019 PCP: Laurey Morale, MD   Brief Narrative:  73 year old female with history of depression and GERD presented with fall at home and was found to have right hip fracture.  She underwent intramedullary nailing of the right hip on 06/22/2019 by orthopedics.  Assessment & Plan:   Closed right hip fracture status post mechanical fall  -Status post intramedullary nailing of the right hip on 06/22/2019.  Orthopedics following.  Wound care/activity as per orthopedics recommendations  -Fall precautions -Pain management -PT initially recommended CIR placement.  CIR now stating that patient is not a CR candidate.  Will probably need SNF placement.  Social worker consult.  Depression -Continue bupropion and venlafaxine  GERD -On PPI  Leukocytosis -Reactive.  Resolved   DVT prophylaxis: Lovenox Code Status: DNR Family Communication: Spoke to patient Disposition Plan: Probable rehab placement once bed is available Consultants: Orthopedics  Procedures: Intramedullary nailing of the right hip on 06/22/2019  Antimicrobials: Perioperative   Subjective: Patient seen and examined at bedside.  Denies worsening fever, nausea or vomiting.  Has intermittent right hip pain. Objective: Vitals:   06/24/19 1422 06/24/19 1924 06/25/19 0327 06/25/19 0839  BP: 96/61 (!) 115/54 110/71 126/71  Pulse: 93 86 (!) 101 95  Resp: 16     Temp: 98.2 F (36.8 C) 98.4 F (36.9 C) 98.2 F (36.8 C) 98.3 F (36.8 C)  TempSrc: Oral Oral Oral Oral  SpO2: 97% 97% 94% 96%  Weight:      Height:        Intake/Output Summary (Last 24 hours) at 06/25/2019 1241 Last data filed at 06/24/2019 1751 Gross per 24 hour  Intake 240 ml  Output -  Net 240 ml   Filed Weights   06/21/19 2228  Weight: 87.1 kg    Examination:  General exam: No acute distress.  Respiratory system: Bilateral decreased breath sounds at bases Cardiovascular system: S1-S2 heard, rate controlled Gastrointestinal system: Abdomen is nondistended, soft and nontender. Normal bowel sounds heard. Extremities: No cyanosis, edema    Data Reviewed: I have personally reviewed following labs and imaging studies  CBC: Recent Labs  Lab 06/19/19 1121  06/22/19 1513 06/22/19 1633 06/23/19 0359 06/24/19 0338 06/25/19 0431  WBC 7.8   < > 16.7* 14.7* 9.7 9.8 9.7  NEUTROABS 4.5  --   --  12.4*  --   --   --   HGB 14.5   < > 12.8 14.2 11.1* 10.9* 10.3*  HCT 43.8   < > 38.2 43.4 33.6* 33.2* 31.2*  MCV 93.1   < > 93.4 93.7 93.1 93.8 94.3  PLT 284.0   < > 250 276 210 198 205   < > = values in this interval not displayed.   Basic Metabolic Panel: Recent Labs  Lab 06/22/19 1513 06/22/19 1633 06/23/19 0359 06/24/19 0338 06/25/19 0431  NA 131* 133* 131* 134* 137  K 4.5 4.1 3.8 4.1 3.8  CL 101 99 101 102 104  CO2 21* 22 23 24 24   GLUCOSE 147* 102* 99 116* 111*  BUN 16 17 17 15 19   CREATININE 0.90 0.89 0.93 0.81 0.83  CALCIUM 8.8* 9.2 8.6* 8.7* 8.9  MG  --   --  1.9 2.0 1.9   GFR: Estimated Creatinine Clearance: 67.4 mL/min (by C-G formula based on SCr of 0.83 mg/dL).  Liver Function Tests: Recent Labs  Lab 06/19/19 1121  AST 8  ALT 6  ALKPHOS 91  BILITOT 0.6  PROT 6.4  ALBUMIN 4.4   No results for input(s): LIPASE, AMYLASE in the last 168 hours. No results for input(s): AMMONIA in the last 168 hours. Coagulation Profile: Recent Labs  Lab 06/22/19 1633  INR 1.1   Cardiac Enzymes: No results for input(s): CKTOTAL, CKMB, CKMBINDEX, TROPONINI in the last 168 hours. BNP (last 3 results) No results for input(s): PROBNP in the last 8760 hours. HbA1C: No results for input(s): HGBA1C in the last 72 hours. CBG: No results for input(s): GLUCAP in the last 168 hours. Lipid Profile: No results for input(s): CHOL, HDL, LDLCALC, TRIG, CHOLHDL, LDLDIRECT in the last  72 hours. Thyroid Function Tests: No results for input(s): TSH, T4TOTAL, FREET4, T3FREE, THYROIDAB in the last 72 hours. Anemia Panel: No results for input(s): VITAMINB12, FOLATE, FERRITIN, TIBC, IRON, RETICCTPCT in the last 72 hours. Sepsis Labs: No results for input(s): PROCALCITON, LATICACIDVEN in the last 168 hours.  Recent Results (from the past 240 hour(s))  SARS Coronavirus 2 (CEPHEID - Performed in [facility] hospital lab), Hosp Order     Status: None   Collection Time: 06/22/19 12:46 AM   Specimen: Nasopharyngeal Swab  Result Value Ref Range Status   SARS Coronavirus 2 NEGATIVE NEGATIVE Final    Comment: (NOTE) If result is NEGATIVE SARS-CoV-2 target nucleic acids are NOT DETECTED. The SARS-CoV-2 RNA is generally detectable in upper and lower  respiratory specimens during the acute phase of infection. The lowest  concentration of SARS-CoV-2 viral copies this assay can detect is 250  copies / mL. A negative result does not preclude SARS-CoV-2 infection  and should not be used as the sole basis for treatment or other  patient management decisions.  A negative result may occur with  improper specimen collection / handling, submission of specimen other  than nasopharyngeal swab, presence of viral mutation(s) within the  areas targeted by this assay, and inadequate number of viral copies  (<250 copies / mL). A negative result must be combined with clinical  observations, patient history, and epidemiological information. If result is POSITIVE SARS-CoV-2 target nucleic acids are DETECTED. The SARS-CoV-2 RNA is generally detectable in upper and lower  respiratory specimens dur ing the acute phase of infection.  Positive  results are indicative of active infection with SARS-CoV-2.  Clinical  correlation with patient history and other diagnostic information is  necessary to determine patient infection status.  Positive results do  not rule out bacterial infection or co-infection  with other viruses. If result is PRESUMPTIVE POSTIVE SARS-CoV-2 nucleic acids MAY BE PRESENT.   A presumptive positive result was obtained on the submitted specimen  and confirmed on repeat testing.  While 2019 novel coronavirus  (SARS-CoV-2) nucleic acids may be present in the submitted sample  additional confirmatory testing may be necessary for epidemiological  and / or clinical management purposes  to differentiate between  SARS-CoV-2 and other Sarbecovirus currently known to infect humans.  If clinically indicated additional testing with an alternate test  methodology [phone]) is advised. The SARS-CoV-2 RNA is generally  detectable in upper and lower respiratory sp ecimens during the acute  phase of infection. The expected result is Negative. Fact Sheet for Patients:  StrictlyIdeas.no Fact Sheet for Healthcare Providers: BankingDealers.co.za This test is not yet approved or cleared by the [place] FDA and has been authorized for detection and/or diagnosis of SARS-CoV-2 by FDA  under an Emergency Use Authorization (EUA).  This EUA will remain in effect (meaning this test can be used) for the duration of the COVID-19 declaration under Section 564(b)(1) of the Act, 21 U.S.C. section 360bbb-3(b)(1), unless the authorization is terminated or revoked sooner. Performed at [facility], [place] [address] [place], [place] [address]   Surgical pcr screen     Status: None   Collection Time: 06/22/19  2:19 AM   Specimen: Nasal Mucosa; Nasal Swab  Result Value Ref Range Status   MRSA, PCR NEGATIVE NEGATIVE Final   Staphylococcus aureus NEGATIVE NEGATIVE Final    Comment: (NOTE) The Xpert SA Assay (FDA approved for NASAL specimens in patients 55 years of age and older), is one component of a comprehensive surveillance program. It is not intended to diagnose infection nor to guide or monitor treatment. Performed at [facility], [place] [address] [place], [place] [address]          Radiology Studies: No results found.      Scheduled Meds: . acetaminophen  650 mg Oral Q6H  . buPROPion  300 mg Oral BH-q7a  . docusate sodium  100 mg Oral BID  . enoxaparin (LOVENOX) injection  40 mg Subcutaneous Q24H  . feeding supplement (ENSURE ENLIVE)  237 mL Oral BID BM  . pantoprazole  40 mg Oral Daily  . venlafaxine XR  75 mg Oral Q breakfast   Continuous Infusions: . methocarbamol (ROBAXIN) IV 500 mg (06/22/19 0550)     LOS: 3 days        Aline August, MD [facility] 06/25/2019, 12:41 PM

## 2019-06-25 NOTE — NC FL2 (Signed)
[place] LEVEL OF CARE SCREENING TOOL     IDENTIFICATION  Patient Name: Emily Horton Birthdate: 1946/11/25 Sex: female Admission Date (Current Location): 06/21/2019  [facility] [place] and [place] Number:  Herbalist and Address:  [facility] [place], [place] [address] [place], [place] [address]      Provider Number: [id number]  Attending Physician Name and Address:  Aline August, MD  Relative Name and Phone Number:  [phone]    Current Level of Care: Hospital Recommended Level of Care: [place] Prior Approval Number:    Date Approved/Denied:   PASRR Number: [id number]  Discharge Plan: SNF    Current Diagnoses: Patient Active Problem List   Diagnosis Date Noted  . Closed right hip fracture, initial encounter ([place]) 06/22/2019  . Environmental and seasonal allergies 06/12/2018  . Dyslipidemia 08/26/2017  . IBS (irritable bowel syndrome) 08/26/2017  . Angio-edema 11/03/2016  . Diverticulosis of colon without hemorrhage 09/30/2015  . Depression 08/22/2007  . GERD 07/04/2007    Orientation RESPIRATION BLADDER Height & Weight     Self, Time, Situation, Place  Normal Continent Weight: 192 lb (87.1 kg) Height:  5\' 4"  (162.6 cm)  BEHAVIORAL SYMPTOMS/MOOD NEUROLOGICAL BOWEL NUTRITION STATUS      Continent Diet(see discharge summary)  AMBULATORY STATUS COMMUNICATION OF NEEDS Skin   Limited Assist Verbally Surgical wounds, Skin abrasions(abrasion right elbow)                       Personal Care Assistance Level of Assistance  Bathing, Dressing, Total care, Feeding Bathing Assistance: Limited assistance Feeding assistance: Independent Dressing Assistance: Limited assistance Total Care Assistance: Limited assistance   Functional Limitations Info  Sight, Hearing, Speech Sight Info: Adequate Hearing Info: Adequate Speech Info: Adequate    SPECIAL CARE FACTORS FREQUENCY  PT (By licensed PT), OT (By licensed  OT)     PT Frequency: min 5x weekly OT Frequency: min 5x weekly            Contractures Contractures Info: Not present    Additional Factors Info  Code Status, Allergies Code Status Info: DNR Allergies Info: Eggs or Egg-derived products, Zoloft (sertraline Hcl)           Current Medications (06/25/2019):  This is the current hospital active medication list Current Facility-Administered Medications  Medication Dose Route Frequency Provider Last Rate Last Dose  . acetaminophen (TYLENOL) tablet 325-650 mg  325-650 mg Oral Q6H PRN Ainsley Spinner, PA-C      . acetaminophen (TYLENOL) tablet 650 mg  650 mg Oral Q6H Ainsley Spinner, PA-C   650 mg at 06/24/19 0505  . alum & mag hydroxide-simeth (MAALOX/MYLANTA) 200-200-20 MG/5ML suspension 30 mL  30 mL Oral Q4H PRN Ainsley Spinner, PA-C      . buPROPion (WELLBUTRIN XL) 24 hr tablet 300 mg  300 mg Oral Micah Flesher, Kshitiz, MD   300 mg at 06/25/19 0856  . docusate sodium (COLACE) capsule 100 mg  100 mg Oral BID Ainsley Spinner, PA-C   100 mg at 06/25/19 6761  . enoxaparin (LOVENOX) injection 40 mg  40 mg Subcutaneous Q24H Ainsley Spinner, PA-C   40 mg at 06/25/19 0854  . feeding supplement (ENSURE ENLIVE) (ENSURE ENLIVE) liquid 237 mL  237 mL Oral BID BM Amin, Ankit Chirag, MD   237 mL at 06/25/19 0856  . HYDROcodone-acetaminophen (NORCO/VICODIN) 5-325 MG per tablet 1-2 tablet  1-2 tablet Oral Q4H PRN Ainsley Spinner, PA-C   2  tablet at 06/25/19 0855  . menthol-cetylpyridinium (CEPACOL) lozenge 3 mg  1 lozenge Oral PRN Ainsley Spinner, PA-C       Or  . phenol (CHLORASEPTIC) mouth spray 1 spray  1 spray Mouth/Throat PRN Ainsley Spinner, PA-C      . methocarbamol (ROBAXIN) tablet 500 mg  500 mg Oral Q6H PRN Ainsley Spinner, PA-C   500 mg at 06/25/19 0855   Or  . methocarbamol (ROBAXIN) 500 mg in dextrose 5 % 50 mL IVPB  500 mg Intravenous Q6H PRN Ainsley Spinner, PA-C 100 mL/hr at 06/22/19 0550 500 mg at 06/22/19 0550  . metoCLOPramide (REGLAN) tablet 5-10 mg  5-10 mg Oral Q8H  PRN Ainsley Spinner, PA-C       Or  . metoCLOPramide (REGLAN) injection 5-10 mg  5-10 mg Intravenous Q8H PRN Ainsley Spinner, PA-C      . morphine 2 MG/ML injection 0.5-1 mg  0.5-1 mg Intravenous Q2H PRN Ainsley Spinner, PA-C   0.5 mg at 06/23/19 2252  . ondansetron (ZOFRAN) tablet 4 mg  4 mg Oral Q6H PRN Ainsley Spinner, PA-C   4 mg at 06/22/19 1644   Or  . ondansetron (ZOFRAN) injection 4 mg  4 mg Intravenous Q6H PRN Ainsley Spinner, PA-C      . pantoprazole (PROTONIX) EC tablet 40 mg  40 mg Oral Daily Amin, Ankit Chirag, MD   40 mg at 06/25/19 0855  . polyethylene glycol (MIRALAX / GLYCOLAX) packet 17 g  17 g Oral Daily PRN Amin, Ankit Chirag, MD      . senna-docusate (Senokot-S) tablet 2 tablet  2 tablet Oral QHS PRN Amin, Ankit Chirag, MD      . venlafaxine XR (EFFEXOR-XR) 24 hr capsule 75 mg  75 mg Oral Q breakfast Aline August, MD   75 mg at 06/25/19 7858     Discharge Medications: Please see discharge summary for a list of discharge medications.  Relevant Imaging Results:  Relevant Lab Results:   Additional Information SSN: [id number]  Alberteen Sam, LCSW

## 2019-06-25 NOTE — Progress Notes (Signed)
Orthopedic Trauma Service Progress Note  Patient ID: Emily Horton MRN: [id number] DOB/AGE: 1946-02-21 73 y.o.  Subjective:  Doing ok  C/o muscle spasms and sorness in R hip  Doing ok with therapy  Prefers CIR if not approved she thinks she can arrange enough assistance for home   Lives in a 2 story house but has access to everything needed on primary level. 3 stairs to get in the house   Review of Systems  Constitutional: Negative for chills and fever.  Respiratory: Negative for shortness of breath and wheezing.   Cardiovascular: Negative for chest pain and palpitations.  Gastrointestinal: Negative for abdominal pain, nausea and vomiting.  Neurological: Negative for tingling and sensory change.    Objective:   VITALS:   Vitals:   06/24/19 1422 06/24/19 1924 06/25/19 0327 06/25/19 0839  BP: 96/61 (!) 115/54 110/71 126/71  Pulse: 93 86 (!) 101 95  Resp: 16     Temp: 98.2 F (36.8 C) 98.4 F (36.9 C) 98.2 F (36.8 C) 98.3 F (36.8 C)  TempSrc: Oral Oral Oral Oral  SpO2: 97% 97% 94% 96%  Weight:      Height:        Estimated body mass index is 32.96 kg/m as calculated from the following:   Height as of this encounter: 5\' 4"  (1.626 m).   Weight as of this encounter: 87.1 kg.   Intake/Output      07/19 0701 - 07/20 0700 07/20 0701 - 07/21 0700   P.O. 600    Total Intake(mL/kg) 600 (6.9)    Net +600         Urine Occurrence 6 x    Stool Occurrence 1 x      LABS  Results for orders placed or performed during the hospital encounter of 06/21/19 (from the past 24 hour(s))  CBC     Status: Abnormal   Collection Time: 06/25/19  4:31 AM  Result Value Ref Range   WBC 9.7 4.0 - 10.5 K/uL   RBC 3.31 (L) 3.87 - 5.11 MIL/uL   Hemoglobin 10.3 (L) 12.0 - 15.0 g/dL   HCT 31.2 (L) 36.0 - 46.0 %   MCV 94.3 80.0 - 100.0 fL   MCH 31.1 26.0 - 34.0 pg   MCHC 33.0 30.0 - 36.0 g/dL   RDW 13.5 11.5 - 15.5  %   Platelets 205 150 - 400 K/uL   nRBC 0.0 0.0 - 0.2 %  Basic metabolic panel     Status: Abnormal   Collection Time: 06/25/19  4:31 AM  Result Value Ref Range   Sodium 137 135 - 145 mmol/L   Potassium 3.8 3.5 - 5.1 mmol/L   Chloride 104 98 - 111 mmol/L   CO2 24 22 - 32 mmol/L   Glucose, Bld 111 (H) 70 - 99 mg/dL   BUN 19 8 - 23 mg/dL   Creatinine, Ser 0.83 0.44 - 1.00 mg/dL   Calcium 8.9 8.9 - 10.3 mg/dL   GFR calc non Af Amer >60 >60 mL/min   GFR calc Af Amer >60 >60 mL/min   Anion gap 9 5 - 15  Magnesium     Status: None   Collection Time: 06/25/19  4:31 AM  Result Value Ref Range   Magnesium 1.9 1.7 - 2.4 mg/dL  PHYSICAL EXAM:   Gen: sitting up in bedside chair, appears well  Lungs: breathing unlabored Ext:       Right Lower Extremity   Dressing stable  Expected drainage  Motor and sensory functions intact  Ext warm   + DP pulse  No DCT   Compartments soft   Excellent ankle ROM   Ecchymosis noted and expected to thigh   Assessment/Plan: 3 Days Post-Op   Principal Problem:   Closed right hip fracture, initial encounter ([place]) Active Problems:   Depression   GERD   Dyslipidemia   Anti-infectives (From admission, onward)   Start     Dose/Rate Route Frequency Ordered Stop   06/22/19 1315  ceFAZolin (ANCEF) IVPB 2g/100 mL premix     2 g 200 mL/hr over 30 Minutes Intravenous Every 6 hours 06/22/19 1300 06/22/19 2122    .  POD/HD#: 61  73 y/o female s/p fall with R hip fracture   -Fall  - R hip fracture  WBAT  ROM as tolerated  PT/OT  Dressing changes as needed   Ok to leave off once wounds dry    Clean with soap and water only   Ice prn for swelling and pain   TED if pt wishes for swelling  - Pain management:  Tylenol  norco for severe pain   - ABL anemia/Hemodynamics  Stable  - Medical issues   Per primary team  - DVT/PE prophylaxis:  Lovenox x 4 weeks post op  - ID:   periop abx completed  - Metabolic Bone Disease:   Vitamin D pending  Mechanism and fracture suggest poor bone quality   Recommend DEXA in 4-8 weeks   Does not recall last time she had a DEXA  - Activity:  WBAT R LEX  - FEN/GI prophylaxis/Foley/Lines:  Reg diet  - Impediments to fracture healing:  Poor bone quality   - Dispo:  Continue with therapies  Await insurance approval for SNF  Ortho issues stable  Follow up with ortho in 2-3 weeks  Weightbearing: WBAT RLE Insicional and dressing care: OK to remove dressings as of now and leave open to air with dry gauze PRN Orthopedic device(s): walker Showering: ok to shower and clean wounds with soap and water VTE prophylaxis: Lovenox 40mg  qd 4 weeks Pain control: tylenol and norco  Follow - up plan: 2-3 weeks  Contact information:  Altamese Gallatin MD, Ainsley Spinner PA-C    Jari Pigg, PA-C [phone] (C) 06/25/2019, 9:39 AM  [facility] [place] [address] [phone] Domingo Sep (F)

## 2019-06-25 NOTE — Progress Notes (Signed)
Physical Therapy Treatment Patient Details Name: Emily Horton MRN: [id number] DOB: [birth year] Today's Date: 06/25/2019    History of Present Illness Emily Horton is a 73 y.o. female with with history of depression and GERD was brought to the ER after patient had a fall at home. Closed right intertrochanteric femur fracture, status post IM nail.    PT Comments    Continuing work on functional mobility and activity tolerance;  Notable progress with progressive amb, transfers; Able to ambulate in the hallway today with min assist; seemed pleased with her progress;   Notable that she reported lightheadedness with amb -- unable to obtain a reliable standing BP (arm too tense), but her BP was 105/71 once seated -- possible that her BP was lower than that during ambulation bouts when she was symptomatic for dizziness;   Have been in contact with Caryl Pina, SW re: dispo options, and very thankful for her help.  Follow Up Recommendations  SNF;Other (comment)(Post-acute rehab -- SNF vs. Home First with [facility])     Equipment Recommendations  Rolling walker with 5" wheels;3in1 (PT)    Recommendations for Other Services OT consult(if they end up opting to go home)     Precautions / Restrictions Precautions Precautions: Fall Precaution Comments: Watch orthostatics Restrictions RLE Weight Bearing: Weight bearing as tolerated    Mobility  Bed Mobility Overal bed mobility: Needs Assistance Bed Mobility: Supine to Sit     Supine to sit: Min assist     General bed mobility comments: Cues to half-bridge to EOB with stronger LE, min assist fro RLE; Min handheld assist to pull to sit  Transfers Overall transfer level: Needs assistance Equipment used: Rolling walker (2 wheeled) Transfers: Sit to/from Stand Sit to Stand: Min assist;Min guard         General transfer comment: Cues for hand placement and safety; Min assist with initial sit to stand; after that jsut cues and minguard fro the next  3 reps of sit to stand  Ambulation/Gait Ambulation/Gait assistance: Min guard;Min assist Gait Distance (Feet): 50 Feet(x2 with seated rest breaks) Assistive device: Rolling walker (2 wheeled) Gait Pattern/deviations: Step-to pattern;Step-through pattern;Trunk flexed(Step through pattern emerging)     General Gait Details: Cues for gait sequence and use of RW for support; re-sized RW for optimal fit; Cues to relax shoulders, and allow for more weight bearing through RLE in stance; Noted improved step length with cues -- min assist for rW advancement; Reported dizziness with walking (BP standing attempted: 143/111, likely incorrect reading due to tenseness in arm); Sat down, and BP was 105/71 -- it is possible that her actual BP was even lower in standing; Recommend orthostatics next session   Stairs             Wheelchair Mobility    Modified Rankin (Stroke Patients Only)       Balance     Sitting balance-Leahy Scale: Good       Standing balance-Leahy Scale: Poor                              Cognition Arousal/Alertness: Awake/alert Behavior During Therapy: WFL for tasks assessed/performed Overall Cognitive Status: Within Functional Limits for tasks assessed                                        Exercises General Exercises -  Lower Extremity Quad Sets: Strengthening;Both;10 reps;Seated Gluteal Sets: Strengthening;Both;10 reps;Seated Heel Slides: AAROM;Right;10 reps Hip ABduction/ADduction: AAROM;Right;10 reps    General Comments        Pertinent Vitals/Pain Pain Assessment: Faces Faces Pain Scale: Hurts little more Pain Location: Rt hip Pain Descriptors / Indicators: Aching Pain Intervention(s): Monitored during session    Home Living                      Prior Function            PT Goals (current goals can now be found in the care plan section) Acute Rehab PT Goals Patient Stated Goal: Get well PT Goal  Formulation: With patient Time For Goal Achievement: 07/07/19 Potential to Achieve Goals: Good Progress towards PT goals: Progressing toward goals    Frequency    Min 3X/week      PT Plan Discharge plan needs to be updated    Co-evaluation              AM-PAC PT "6 Clicks" Mobility   Outcome Measure  Help needed turning from your back to your side while in a flat bed without using bedrails?: A Little Help needed moving from lying on your back to sitting on the side of a flat bed without using bedrails?: A Little Help needed moving to and from a bed to a chair (including a wheelchair)?: A Little Help needed standing up from a chair using your arms (e.g., wheelchair or bedside chair)?: A Little Help needed to walk in hospital room?: A Little Help needed climbing 3-5 steps with a railing? : A Lot 6 Click Score: 17    End of Session Equipment Utilized During Treatment: Gait belt Activity Tolerance: Patient tolerated treatment well;Other (comment)(though reports of lightheadedness with progressive amb) Patient left: in chair;with call bell/phone within reach Nurse Communication: Mobility status PT Visit Diagnosis: Unsteadiness on feet (R26.81);History of falling (Z91.81);Difficulty in walking, not elsewhere classified (R26.2);Pain Pain - Right/Left: Right Pain - part of body: Hip     Time: 6301-6010 PT Time Calculation (min) (ACUTE ONLY): 45 min  Charges:  $Gait Training: 8-22 mins $Therapeutic Exercise: 8-22 mins $Therapeutic Activity: 8-22 mins                     Roney Marion, PT  Acute Rehabilitation Services Pager [phone] Office [place] 06/25/2019, 2:44 PM

## 2019-06-25 NOTE — TOC Initial Note (Signed)
Transition of Care [facility]) - Initial/Assessment Note    Patient Details  Name: Emily Horton MRN: [id number] Date of Birth: Oct 10, 1946  [facility] CM/SW Contact:    Alberteen Sam, [place] Phone Number: [phone] 06/25/2019, 12:27 PM  Clinical Narrative:                  CSW met with patient and daughter Emily Horton was on phone to discuss discharge planning. Patient very upset she was denied by CIR and appears resistant to go to SNF. Explored Home Health options and daughter Emily Horton inquired about private pay nurses and aids. Mandy requested CSW send her email with information regarding home health and SNF for her to assist patient in making discharge plan decisions.   CSW sent email, unfortunately their SNF preference of [facility] has no beds avail. 2nd choice of [facility] is pending response on bed avail at this time. CSW will continue to update patient and daughter.   Expected Discharge Plan: Skilled Nursing Facility Barriers to Discharge: Continued Medical Work up   Patient Goals and CMS Choice Patient states their goals for this hospitalization and ongoing recovery are:: to go home CMS Medicare.gov Compare Post Acute Care list provided to:: Patient Choice offered to / list presented to : Patient  Expected Discharge Plan and Services Expected Discharge Plan: [place] Choice: [place] arrangements for the past 2 months: Single Family Home                                      Prior Living Arrangements/Services Living arrangements for the past 2 months: Single Family Home Lives with:: Self Patient language and need for interpreter reviewed:: Yes        Need for Family Participation in Patient Care: Yes (Comment) Care giver support system in place?: Yes (comment)   Criminal Activity/Legal Involvement Pertinent to Current Situation/Hospitalization: No - Comment as needed  Activities of Daily Living Home  Assistive Devices/Equipment: Eyeglasses ADL Screening (condition at time of admission) Patient's cognitive ability adequate to safely complete daily activities?: Yes Is the patient deaf or have difficulty hearing?: No Does the patient have difficulty seeing, even when wearing glasses/contacts?: No Does the patient have difficulty concentrating, remembering, or making decisions?: No Patient able to express need for assistance with ADLs?: Yes Does the patient have difficulty dressing or bathing?: No Independently performs ADLs?: Yes (appropriate for developmental age) Does the patient have difficulty walking or climbing stairs?: No Weakness of Legs: None Weakness of Arms/Hands: None  Permission Sought/Granted Permission sought to share information with : Case Manager, Customer service manager, Family Supports Permission granted to share information with : Yes, Release of Information Signed  Share Information with NAME: Emily Horton  Permission granted to share info w AGENCY: SNFs  Permission granted to share info w Relationship: daughter  Permission granted to share info w Contact Information: [phone]  Emotional Assessment Appearance:: Appears stated age Attitude/Demeanor/Rapport: Gracious Affect (typically observed): Calm Orientation: : Oriented to Self, Oriented to Place, Oriented to  Time, Oriented to Situation Alcohol / Substance Use: Not Applicable Psych Involvement: No (comment)  Admission diagnosis:  Closed fracture of right hip, initial encounter ([place]) [S72.001A] Closed right hip fracture, initial encounter ([place]) [S72.001A] Patient Active Problem List   Diagnosis Date Noted  . Closed right hip fracture, initial encounter ([place]) 06/22/2019  . Environmental and  seasonal allergies 06/12/2018  . Dyslipidemia 08/26/2017  . IBS (irritable bowel syndrome) 08/26/2017  . Angio-edema 11/03/2016  . Diverticulosis of colon without hemorrhage 09/30/2015  . Depression 08/22/2007   . GERD 07/04/2007   PCP:  Laurey Morale, MD Pharmacy:   [facility], [address] AT [facility] [address] [place] [address] Phone: [phone] Fax: [phone]     Social Determinants of Health (SDOH) Interventions    Readmission Risk Interventions No flowsheet data found.

## 2019-06-25 NOTE — Discharge Instructions (Signed)
[facility] Discharge Instructions   General Discharge Instructions  WEIGHT BEARING STATUS: Weightbearing as tolerated Right leg   RANGE OF MOTION/ACTIVITY: unrestricted range of motion Right hip and knee. Activity as tolerated   Bone health:  Continue with vitamin d, calcium and vitamin C. Will need a bone density scan (DEXA) in 4-8 weeks to determine if additional measures are needed to aide in bone health.   Wound Care: daily wound care starting now.  See below   Discharge Wound Care Instructions  Do NOT apply any ointments, solutions or lotions to pin sites or surgical wounds.  These prevent needed drainage and even though solutions like hydrogen peroxide kill bacteria, they also damage cells lining the pin sites that help fight infection.  Applying lotions or ointments can keep the wounds moist and can cause them to breakdown and open up as well. This can increase the risk for infection. When in doubt call the office.  Surgical incisions should be dressed daily.  If any drainage is noted, use one layer of adaptic, then gauze, Kerlix, and an ace wrap.  Once the incision is completely dry and without drainage, it may be left open to air out.  Showering may begin 36-48 hours later.  Cleaning gently with soap and water.  Traumatic wounds should be dressed daily as well.    One layer of adaptic, gauze, Kerlix, then ace wrap.  The adaptic can be discontinued once the draining has ceased    If you have a wet to dry dressing: wet the gauze with saline the squeeze as much saline out so the gauze is moist (not soaking wet), place moistened gauze over wound, then place a dry gauze over the moist one, followed by Kerlix wrap, then ace wrap.  DVT/PE prophylaxis: Lovenox 40 mg subcutaneous injection x 4 weeks   Diet: as you were eating previously.  Can use over the counter stool softeners and bowel preparations, such as Miralax, to help with bowel movements.  Narcotics can be  constipating.  Be sure to drink plenty of fluids  PAIN MEDICATION USE AND EXPECTATIONS  You have likely been given narcotic medications to help control your pain.  After a traumatic event that results in an fracture (broken bone) with or without surgery, it is ok to use narcotic pain medications to help control one's pain.  We understand that everyone responds to pain differently and each individual patient will be evaluated on a regular basis for the continued need for narcotic medications. Ideally, narcotic medication use should last no more than 6-8 weeks (coinciding with fracture healing).   As a patient it is your responsibility as well to monitor narcotic medication use and report the amount and frequency you use these medications when you come to your office visit.   We would also advise that if you are using narcotic medications, you should take a dose prior to therapy to maximize you participation.  IF YOU ARE ON NARCOTIC MEDICATIONS IT IS NOT PERMISSIBLE TO OPERATE A MOTOR VEHICLE (MOTORCYCLE/CAR/TRUCK/MOPED) OR HEAVY MACHINERY DO NOT MIX NARCOTICS WITH OTHER CNS (CENTRAL NERVOUS SYSTEM) DEPRESSANTS SUCH AS ALCOHOL   STOP SMOKING OR USING NICOTINE PRODUCTS!!!!  As discussed nicotine severely impairs your body's ability to heal surgical and traumatic wounds but also impairs bone healing.  Wounds and bone heal by forming microscopic blood vessels (angiogenesis) and nicotine is a vasoconstrictor (essentially, shrinks blood vessels).  Therefore, if vasoconstriction occurs to these microscopic blood vessels they essentially disappear and  are unable to deliver necessary nutrients to the healing tissue.  This is one modifiable factor that you can do to dramatically increase your chances of healing your injury.    (This means no smoking, no nicotine gum, patches, etc)  DO NOT USE NONSTEROIDAL ANTI-INFLAMMATORY DRUGS (NSAID'S)  Using products such as Advil (ibuprofen), Aleve (naproxen), Motrin  (ibuprofen) for additional pain control during fracture healing can delay and/or prevent the healing response.  If you would like to take over the counter (OTC) medication, Tylenol (acetaminophen) is ok.  However, some narcotic medications that are given for pain control contain acetaminophen as well. Therefore, you should not exceed more than 4000 mg of tylenol in a day if you do not have liver disease.  Also note that there are may OTC medicines, such as cold medicines and allergy medicines that my contain tylenol as well.  If you have any questions about medications and/or interactions please ask your doctor/PA or your pharmacist.      ICE AND ELEVATE INJURED/OPERATIVE EXTREMITY  Using ice and elevating the injured extremity above your heart can help with swelling and pain control.  Icing in a pulsatile fashion, such as 20 minutes on and 20 minutes off, can be followed.    Do not place ice directly on skin. Make sure there is a barrier between to skin and the ice pack.    Using frozen items such as frozen peas works well as the conform nicely to the are that needs to be iced.  USE AN ACE WRAP OR TED HOSE FOR SWELLING CONTROL  In addition to icing and elevation, Ace wraps or TED hose are used to help limit and resolve swelling.  It is recommended to use Ace wraps or TED hose until you are informed to stop.    When using Ace Wraps start the wrapping distally (farthest away from the body) and wrap proximally (closer to the body)   Example: If you had surgery on your leg or thing and you do not have a splint on, start the ace wrap at the toes and work your way up to the thigh        If you had surgery on your upper extremity and do not have a splint on, start the ace wrap at your fingers and work your way up to the upper arm  IF YOU ARE IN A SPLINT OR CAST DO NOT [place]   If your splint gets wet for any reason please contact the office immediately. You may shower in your splint or  cast as long as you keep it dry.  This can be done by wrapping in a cast cover or garbage back (or similar)  Do Not stick any thing down your splint or cast such as pencils, money, or hangers to try and scratch yourself with.  If you feel itchy take benadryl as prescribed on the bottle for itching  IF YOU ARE IN A CAM BOOT (BLACK BOOT)  You may remove boot periodically. Perform daily dressing changes as noted below.  Wash the liner of the boot regularly and wear a sock when wearing the boot. It is recommended that you sleep in the boot until told otherwise  CALL THE OFFICE WITH ANY QUESTIONS OR CONCERNS: [phone]

## 2019-06-25 NOTE — Progress Notes (Signed)
Inpatient Rehabilitation-Admissions Coordinator   After chart review, pt does not appear to meet criteria for medical necessity to warrant an IP Rehab stay at this time. AC will sign off. AC will contact SW/CM regarding need for new dispo plans.   Please call if questions.   Jhonnie Garner, OTR/L  Rehab Admissions Coordinator  [phone] 06/25/2019 10:01 AM

## 2019-06-26 DIAGNOSIS — F329 Major depressive disorder, single episode, unspecified: Secondary | ICD-10-CM | POA: Diagnosis not present

## 2019-06-26 DIAGNOSIS — M62838 Other muscle spasm: Secondary | ICD-10-CM | POA: Diagnosis not present

## 2019-06-26 DIAGNOSIS — M6281 Muscle weakness (generalized): Secondary | ICD-10-CM | POA: Diagnosis not present

## 2019-06-26 DIAGNOSIS — S72141D Displaced intertrochanteric fracture of right femur, subsequent encounter for closed fracture with routine healing: Secondary | ICD-10-CM | POA: Diagnosis not present

## 2019-06-26 DIAGNOSIS — J309 Allergic rhinitis, unspecified: Secondary | ICD-10-CM | POA: Diagnosis not present

## 2019-06-26 DIAGNOSIS — F419 Anxiety disorder, unspecified: Secondary | ICD-10-CM | POA: Diagnosis not present

## 2019-06-26 DIAGNOSIS — R5381 Other malaise: Secondary | ICD-10-CM | POA: Diagnosis not present

## 2019-06-26 DIAGNOSIS — Z9181 History of falling: Secondary | ICD-10-CM | POA: Diagnosis not present

## 2019-06-26 DIAGNOSIS — R296 Repeated falls: Secondary | ICD-10-CM | POA: Diagnosis not present

## 2019-06-26 DIAGNOSIS — Z7409 Other reduced mobility: Secondary | ICD-10-CM | POA: Diagnosis not present

## 2019-06-26 DIAGNOSIS — G8911 Acute pain due to trauma: Secondary | ICD-10-CM | POA: Diagnosis not present

## 2019-06-26 DIAGNOSIS — G8929 Other chronic pain: Secondary | ICD-10-CM | POA: Diagnosis not present

## 2019-06-26 DIAGNOSIS — K5900 Constipation, unspecified: Secondary | ICD-10-CM | POA: Diagnosis not present

## 2019-06-26 DIAGNOSIS — Z111 Encounter for screening for respiratory tuberculosis: Secondary | ICD-10-CM | POA: Diagnosis not present

## 2019-06-26 DIAGNOSIS — R51 Headache: Secondary | ICD-10-CM | POA: Diagnosis not present

## 2019-06-26 DIAGNOSIS — F3289 Other specified depressive episodes: Secondary | ICD-10-CM | POA: Diagnosis not present

## 2019-06-26 DIAGNOSIS — W19XXXA Unspecified fall, initial encounter: Secondary | ICD-10-CM | POA: Diagnosis not present

## 2019-06-26 DIAGNOSIS — B379 Candidiasis, unspecified: Secondary | ICD-10-CM | POA: Diagnosis not present

## 2019-06-26 DIAGNOSIS — F331 Major depressive disorder, recurrent, moderate: Secondary | ICD-10-CM | POA: Diagnosis not present

## 2019-06-26 DIAGNOSIS — K581 Irritable bowel syndrome with constipation: Secondary | ICD-10-CM | POA: Diagnosis not present

## 2019-06-26 DIAGNOSIS — R2689 Other abnormalities of gait and mobility: Secondary | ICD-10-CM | POA: Diagnosis not present

## 2019-06-26 DIAGNOSIS — T7840XA Allergy, unspecified, initial encounter: Secondary | ICD-10-CM | POA: Diagnosis not present

## 2019-06-26 DIAGNOSIS — R52 Pain, unspecified: Secondary | ICD-10-CM | POA: Diagnosis not present

## 2019-06-26 DIAGNOSIS — M2550 Pain in unspecified joint: Secondary | ICD-10-CM | POA: Diagnosis not present

## 2019-06-26 DIAGNOSIS — D689 Coagulation defect, unspecified: Secondary | ICD-10-CM | POA: Diagnosis not present

## 2019-06-26 DIAGNOSIS — K219 Gastro-esophageal reflux disease without esophagitis: Secondary | ICD-10-CM | POA: Diagnosis not present

## 2019-06-26 DIAGNOSIS — L309 Dermatitis, unspecified: Secondary | ICD-10-CM | POA: Diagnosis not present

## 2019-06-26 DIAGNOSIS — K589 Irritable bowel syndrome without diarrhea: Secondary | ICD-10-CM | POA: Diagnosis not present

## 2019-06-26 DIAGNOSIS — L2089 Other atopic dermatitis: Secondary | ICD-10-CM | POA: Diagnosis not present

## 2019-06-26 DIAGNOSIS — M80051D Age-related osteoporosis with current pathological fracture, right femur, subsequent encounter for fracture with routine healing: Secondary | ICD-10-CM | POA: Diagnosis not present

## 2019-06-26 DIAGNOSIS — Z23 Encounter for immunization: Secondary | ICD-10-CM | POA: Diagnosis not present

## 2019-06-26 DIAGNOSIS — E559 Vitamin D deficiency, unspecified: Secondary | ICD-10-CM | POA: Diagnosis not present

## 2019-06-26 DIAGNOSIS — S72001A Fracture of unspecified part of neck of right femur, initial encounter for closed fracture: Secondary | ICD-10-CM | POA: Diagnosis not present

## 2019-06-26 DIAGNOSIS — J302 Other seasonal allergic rhinitis: Secondary | ICD-10-CM | POA: Diagnosis not present

## 2019-06-26 DIAGNOSIS — R21 Rash and other nonspecific skin eruption: Secondary | ICD-10-CM | POA: Diagnosis not present

## 2019-06-26 DIAGNOSIS — T7840XS Allergy, unspecified, sequela: Secondary | ICD-10-CM | POA: Diagnosis not present

## 2019-06-26 DIAGNOSIS — D649 Anemia, unspecified: Secondary | ICD-10-CM | POA: Diagnosis not present

## 2019-06-26 DIAGNOSIS — E785 Hyperlipidemia, unspecified: Secondary | ICD-10-CM | POA: Diagnosis not present

## 2019-06-26 DIAGNOSIS — Z7401 Bed confinement status: Secondary | ICD-10-CM | POA: Diagnosis not present

## 2019-06-26 DIAGNOSIS — S72001D Fracture of unspecified part of neck of right femur, subsequent encounter for closed fracture with routine healing: Secondary | ICD-10-CM | POA: Diagnosis not present

## 2019-06-26 DIAGNOSIS — T7808XS Anaphylactic reaction due to eggs, sequela: Secondary | ICD-10-CM | POA: Diagnosis not present

## 2019-06-26 LAB — BASIC METABOLIC PANEL
Anion gap: 7 (ref 5–15)
BUN: 24 mg/dL — ABNORMAL HIGH (ref 8–23)
CO2: 25 mmol/L (ref 22–32)
Calcium: 9.1 mg/dL (ref 8.9–10.3)
Chloride: 104 mmol/L (ref 98–111)
Creatinine, Ser: 0.85 mg/dL (ref 0.44–1.00)
GFR calc Af Amer: >60 mL/min (ref >60)
GFR calc non Af Amer: >60 mL/min (ref >60)
Glucose, Bld: 105 mg/dL — ABNORMAL HIGH (ref 70–99)
Potassium: 3.6 mmol/L (ref 3.5–5.1)
Sodium: 136 mmol/L (ref 135–145)

## 2019-06-26 LAB — CBC
HCT: 32.3 % — ABNORMAL LOW (ref 36.0–46.0)
Hemoglobin: 10.4 g/dL — ABNORMAL LOW (ref 12.0–15.0)
MCH: 30.7 pg (ref 26.0–34.0)
MCHC: 32.2 g/dL (ref 30.0–36.0)
MCV: 95.3 fL (ref 80.0–100.0)
Platelets: 240 10*3/uL (ref 150–400)
RBC: 3.39 MIL/uL — ABNORMAL LOW (ref 3.87–5.11)
RDW: 13.5 % (ref 11.5–15.5)
WBC: 9.5 10*3/uL (ref 4.0–10.5)
nRBC: 0 % (ref 0.0–0.2)

## 2019-06-26 LAB — MAGNESIUM: Magnesium: 2.1 mg/dL (ref 1.7–2.4)

## 2019-06-26 MED ORDER — ENOXAPARIN SODIUM 40 MG/0.4ML ~~LOC~~ SOLN: 40.0000 mg | SUBCUTANEOUS | 0 refills | Status: DC

## 2019-06-26 MED ORDER — ACETAMINOPHEN 325 MG PO TABS: 325.0000 mg | ORAL_TABLET | Freq: Four times a day (QID) | ORAL | 0 refills | Status: DC | PRN

## 2019-06-26 MED ORDER — SENNOSIDES-DOCUSATE SODIUM 8.6-50 MG PO TABS: 1.0000 | ORAL_TABLET | Freq: Two times a day (BID) | ORAL | 0 refills | Status: DC

## 2019-06-26 MED ORDER — OMEPRAZOLE 40 MG PO CPDR: 40.0000 mg | DELAYED_RELEASE_CAPSULE | Freq: Every day | ORAL | Status: DC

## 2019-06-26 MED ORDER — POLYETHYLENE GLYCOL 3350 17 G PO PACK: 17.0000 g | PACK | Freq: Every day | ORAL | 0 refills | Status: DC | PRN

## 2019-06-26 MED ORDER — HYDROCODONE-ACETAMINOPHEN 5-325 MG PO TABS: 1.0000 | ORAL_TABLET | Freq: Four times a day (QID) | ORAL | 0 refills | Status: DC | PRN

## 2019-06-26 NOTE — Discharge Summary (Signed)
Physician Discharge Summary  MANAL KREUTZER UDJ:[id number] DOB: Aug 01, 1946 DOA: 06/21/2019  PCP: Laurey Morale, MD  Admit date: 06/21/2019 Discharge date: 06/26/2019  Admitted From: Home Disposition: SNF  Recommendations for Outpatient Follow-up:  1. Follow up with SNF provider at earliest convenience 2. Outpatient follow-up with orthopedics.  Wound care/activity as per orthopedics recommendations 3. Follow up in ED if symptoms worsen or new appear   Home Health: No Equipment/Devices: None  Discharge Condition: Stable CODE STATUS: Full Diet recommendation: Heart healthy  Brief/Interim Summary: 73 year old female with history of depression and GERD presented with fall at home and was found to have right hip fracture.  She underwent intramedullary nailing of the right hip on 06/22/2019 by orthopedics.  Postoperatively, she has tolerated physical therapy who recommended SNF placement.  She will be discharged to SNF once bed is available.  Discharge Diagnoses:   Closed right hip fracture status post mechanical fall  -Status post intramedullary nailing of the right hip on 06/22/2019.  Orthopedics following.  Wound care/activity as per orthopedics recommendations  -Fall precautions -PT recommending SNF placement.  Discharge to SNF once bed is available. -Discharge DVT prophylaxis and pain regimen will be as per orthopedics recommendations  -Outpatient follow-up with orthopedics  depression -Continue bupropion and venlafaxine  GERD -On PPI  Leukocytosis -Reactive.  Resolved   Discharge Instructions  Discharge Instructions    Diet - low sodium heart healthy   Complete by: As directed    Increase activity slowly   Complete by: As directed      Allergies as of 06/26/2019      Reactions   Eggs Or Egg-derived Products    Hives   Zoloft [sertraline Hcl]    Headaches       Medication List    TAKE these medications   aspirin-acetaminophen-caffeine 250-250-65 MG  tablet Commonly known as: EXCEDRIN MIGRAINE Take 1 tablet by mouth every 6 (six) hours as needed for headache.   buPROPion 300 MG 24 hr tablet Commonly known as: WELLBUTRIN XL TAKE 1 TABLET BY MOUTH EVERY MORNING What changed: when to take this   cetirizine 10 MG tablet Commonly known as: ZYRTEC TAKE 1 TABLET(10 MG) BY MOUTH DAILY What changed: See the new instructions.   EPINEPHrine 0.3 mg/0.3 mL Soaj injection Commonly known as: EPI-PEN Inject 0.3 mLs into the muscle daily as needed for anaphylaxis.   hyoscyamine 0.125 MG SL tablet Commonly known as: LEVSIN SL Place 1 tablet (0.125 mg total) under the tongue every 4 (four) hours as needed. What changed: reasons to take this   omeprazole 40 MG capsule Commonly known as: PRILOSEC Take 1 capsule (40 mg total) by mouth daily. What changed: when to take this   polyethylene glycol 17 g packet Commonly known as: MIRALAX / GLYCOLAX Take 17 g by mouth daily as needed for moderate constipation or severe constipation.   senna-docusate 8.6-50 MG tablet Commonly known as: Senokot-S Take 1 tablet by mouth 2 (two) times daily.   triamcinolone cream 0.1 % Commonly known as: KENALOG Apply 1 application topically 2 (two) times daily.   venlafaxine XR 75 MG 24 hr capsule Commonly known as: Effexor XR Take 1 capsule (75 mg total) by mouth daily with breakfast.      Follow-up Information    Altamese , MD. Schedule an appointment as soon as possible for a visit in 3 week(s).   Specialty: Orthopedic Surgery Contact information: [place] [address] [phone]  Allergies  Allergen Reactions  . Eggs Or Egg-Derived Products     Hives   . Zoloft [Sertraline Hcl]     Headaches     Consultations:  Orthopedics   Procedures/Studies: Dg Chest 1 View  Result Date: 06/21/2019 CLINICAL DATA:  Fall, hip fracture EXAM: CHEST  1 VIEW COMPARISON:  07/06/2016 FINDINGS: Heart and mediastinal  contours are within normal limits. No focal opacities or effusions. No acute bony abnormality. IMPRESSION: No active disease. Electronically Signed   By: Rolm Baptise M.D.   On: 06/21/2019 23:55   Pelvis Portable  Result Date: 06/22/2019 CLINICAL DATA:  Status postoperative fixation of a right intertrochanteric fracture. EXAM: PORTABLE PELVIS 1-2 VIEWS COMPARISON:  Yesterday. FINDINGS: Interval screw and rod fixation of the previously demonstrated right intertrochanteric fracture. The distal portion of the rod is not included. Improved position and alignment of the fragments with some residual distraction and displacement. No new fracture or dislocation. Lower lumbar spine degenerative changes. IMPRESSION: Hardware fixation of the previously demonstrated right intertrochanteric fracture with improved position and alignment. Electronically Signed   By: Claudie Revering M.D.   On: 06/22/2019 17:26   Dg C-arm 1-60 Min  Result Date: 06/22/2019 CLINICAL DATA:  Portable operative imaging for ORIF of a proximal right femur fracture EXAM: RIGHT FEMUR 2 VIEWS; DG C-ARM 61-120 MIN COMPARISON:  06/21/2019 FINDINGS: Imaging shows placement of an intramedullary rod supporting a compression screw, reducing primary fracture components into near anatomic alignment. The orthopedic hardware appears well seated and well-positioned. No evidence of an operative complication. IMPRESSION: Well aligned proximal right femur fracture following ORIF. Electronically Signed   By: Lajean Manes M.D.   On: 06/22/2019 11:19   Dg Hip Operative Unilat With Pelvis Left  Result Date: 06/22/2019 CLINICAL DATA:  Comparison of left hip of fractured right hip. EXAM: OPERATIVE left HIP (WITH PELVIS IF PERFORMED) 1 VIEWS TECHNIQUE: Fluoroscopic spot image(s) were submitted for interpretation post-operatively. FLUOROSCOPY TIME:  2 seconds. COMPARISON:  None. FINDINGS: One fluoroscopic image of the left hip was obtained. The left hip appears normal.  IMPRESSION: Fluoroscopic evaluation of left hip was performed. Electronically Signed   By: Marijo Conception M.D.   On: 06/22/2019 10:42   Dg Hip Unilat W Or Wo Pelvis 2-3 Views Right  Result Date: 06/21/2019 CLINICAL DATA:  Fall.  Right hip pain. EXAM: DG HIP (WITH OR WITHOUT PELVIS) 2-3V RIGHT COMPARISON:  None. FINDINGS: There is a proximal right femoral intertrochanteric fracture with varus angulation. No subluxation or dislocation. Joint spaces are maintained and symmetric. IMPRESSION: Right femoral intertrochanteric fracture with varus angulation. Electronically Signed   By: Rolm Baptise M.D.   On: 06/21/2019 23:54   Dg Femur, Min 2 Views Right  Result Date: 06/22/2019 CLINICAL DATA:  Portable operative imaging for ORIF of a proximal right femur fracture EXAM: RIGHT FEMUR 2 VIEWS; DG C-ARM 61-120 MIN COMPARISON:  06/21/2019 FINDINGS: Imaging shows placement of an intramedullary rod supporting a compression screw, reducing primary fracture components into near anatomic alignment. The orthopedic hardware appears well seated and well-positioned. No evidence of an operative complication. IMPRESSION: Well aligned proximal right femur fracture following ORIF. Electronically Signed   By: Lajean Manes M.D.   On: 06/22/2019 11:19   Dg Femur Port, Min 2 Views Right  Result Date: 06/22/2019 CLINICAL DATA:  Operative fixation of a right femur intertrochanteric fracture. EXAM: RIGHT FEMUR PORTABLE 2 VIEW COMPARISON:  Pelvis radiograph obtained at the same time. Right hip radiographs obtained yesterday FINDINGS: Intramedullary  rod and screw fixation of the previously demonstrated comminuted right intertrochanteric fracture with improved position and alignment. There is some residual distraction and displacement of the fragments. Right knee degenerative spur formation is noted. No additional fractures are seen IMPRESSION: Hardware fixation of the previously demonstrated comminuted right intertrochanteric fracture  with improved position and alignment. Electronically Signed   By: Claudie Revering M.D.   On: 06/22/2019 17:27       Subjective: Patient seen and examined at bedside.  Still complains of some right hip pain intermittently.  No overnight fever or vomiting.  Discharge Exam: Vitals:   06/26/19 0336 06/26/19 0751  BP: 138/82 116/65  Pulse: 89 86  Resp: 16 18  Temp: 98.6 F (37 C) 98.9 F (37.2 C)  SpO2: 97% 97%    General exam: No acute distress. Respiratory system: Bilateral decreased breath sounds at bases Cardiovascular system: S1-S2 heard, rate controlled Gastrointestinal system: Abdomen is nondistended, soft and nontender. Normal bowel sounds heard. Extremities: No cyanosis, edema    The results of significant diagnostics from this hospitalization (including imaging, microbiology, ancillary and laboratory) are listed below for reference.     Microbiology: Recent Results (from the past 240 hour(s))  SARS Coronavirus 2 (CEPHEID - Performed in [facility] hospital lab), Hosp Order     Status: None   Collection Time: 06/22/19 12:46 AM   Specimen: Nasopharyngeal Swab  Result Value Ref Range Status   SARS Coronavirus 2 NEGATIVE NEGATIVE Final    Comment: (NOTE) If result is NEGATIVE SARS-CoV-2 target nucleic acids are NOT DETECTED. The SARS-CoV-2 RNA is generally detectable in upper and lower  respiratory specimens during the acute phase of infection. The lowest  concentration of SARS-CoV-2 viral copies this assay can detect is 250  copies / mL. A negative result does not preclude SARS-CoV-2 infection  and should not be used as the sole basis for treatment or other  patient management decisions.  A negative result may occur with  improper specimen collection / handling, submission of specimen other  than nasopharyngeal swab, presence of viral mutation(s) within the  areas targeted by this assay, and inadequate number of viral copies  (<250 copies / mL). A negative result must  be combined with clinical  observations, patient history, and epidemiological information. If result is POSITIVE SARS-CoV-2 target nucleic acids are DETECTED. The SARS-CoV-2 RNA is generally detectable in upper and lower  respiratory specimens dur ing the acute phase of infection.  Positive  results are indicative of active infection with SARS-CoV-2.  Clinical  correlation with patient history and other diagnostic information is  necessary to determine patient infection status.  Positive results do  not rule out bacterial infection or co-infection with other viruses. If result is PRESUMPTIVE POSTIVE SARS-CoV-2 nucleic acids MAY BE PRESENT.   A presumptive positive result was obtained on the submitted specimen  and confirmed on repeat testing.  While 2019 novel coronavirus  (SARS-CoV-2) nucleic acids may be present in the submitted sample  additional confirmatory testing may be necessary for epidemiological  and / or clinical management purposes  to differentiate between  SARS-CoV-2 and other Sarbecovirus currently known to infect humans.  If clinically indicated additional testing with an alternate test  methodology [phone]) is advised. The SARS-CoV-2 RNA is generally  detectable in upper and lower respiratory sp ecimens during the acute  phase of infection. The expected result is Negative. Fact Sheet for Patients:  StrictlyIdeas.no Fact Sheet for Healthcare Providers: BankingDealers.co.za This test is not yet approved  or cleared by the [place] and has been authorized for detection and/or diagnosis of SARS-CoV-2 by FDA under an Emergency Use Authorization (EUA).  This EUA will remain in effect (meaning this test can be used) for the duration of the COVID-19 declaration under Section 564(b)(1) of the Act, 21 U.S.C. section 360bbb-3(b)(1), unless the authorization is terminated or revoked sooner. Performed at [facility], [place] [address] [place], [place] [address]   Surgical pcr screen     Status: None   Collection Time: 06/22/19  2:19 AM   Specimen: Nasal Mucosa; Nasal Swab  Result Value Ref Range Status   MRSA, PCR NEGATIVE NEGATIVE Final   Staphylococcus aureus NEGATIVE NEGATIVE Final    Comment: (NOTE) The Xpert SA Assay (FDA approved for NASAL specimens in patients 50 years of age and older), is one component of a comprehensive surveillance program. It is not intended to diagnose infection nor to guide or monitor treatment. Performed at [facility], [place] [address] [place], [place] [address]      Labs: BNP (last 3 results) No results for input(s): BNP in the last 8760 hours. Basic Metabolic Panel: Recent Labs  Lab 06/22/19 1633 06/23/19 0359 06/24/19 0338 06/25/19 0431 06/26/19 0241  NA 133* 131* 134* 137 136  K 4.1 3.8 4.1 3.8 3.6  CL 99 101 102 104 104  CO2 22 23 24 24 25   GLUCOSE 102* 99 116* 111* 105*  BUN 17 17 15 19  24*  CREATININE 0.89 0.93 0.81 0.83 0.85  CALCIUM 9.2 8.6* 8.7* 8.9 9.1  MG  --  1.9 2.0 1.9 2.1   Liver Function Tests: Recent Labs  Lab 06/19/19 1121  AST 8  ALT 6  ALKPHOS 91  BILITOT 0.6  PROT 6.4  ALBUMIN 4.4   No results for input(s): LIPASE, AMYLASE in the last 168 hours. No results for input(s): AMMONIA in the last 168 hours. CBC: Recent Labs  Lab 06/19/19 1121  06/22/19 1633 06/23/19 0359 06/24/19 0338 06/25/19 0431 06/26/19 0241  WBC 7.8   < > 14.7* 9.7 9.8 9.7 9.5  NEUTROABS 4.5  --  12.4*  --   --   --   --   HGB 14.5   < > 14.2 11.1* 10.9* 10.3* 10.4*  HCT 43.8   < > 43.4 33.6* 33.2* 31.2* 32.3*  MCV 93.1   < > 93.7 93.1 93.8 94.3 95.3  PLT 284.0   < > 276 210 198 205 240   < > = values in this interval not displayed.   Cardiac Enzymes: No results for input(s): CKTOTAL, CKMB, CKMBINDEX, TROPONINI in the last 168 hours. BNP: Invalid input(s): POCBNP CBG: No results for input(s): GLUCAP in the last 168  hours. D-Dimer No results for input(s): DDIMER in the last 72 hours. Hgb A1c No results for input(s): HGBA1C in the last 72 hours. Lipid Profile No results for input(s): CHOL, HDL, LDLCALC, TRIG, CHOLHDL, LDLDIRECT in the last 72 hours. Thyroid function studies No results for input(s): TSH, T4TOTAL, T3FREE, THYROIDAB in the last 72 hours.  Invalid input(s): FREET3 Anemia work up No results for input(s): VITAMINB12, FOLATE, FERRITIN, TIBC, IRON, RETICCTPCT in the last 72 hours. Urinalysis    Component Value Date/Time   COLORURINE YELLOW 09/03/2015 [place] 09/03/2015 0942   LABSPEC 1.020 09/03/2015 0942   PHURINE 6.0 09/03/2015 0942   GLUCOSEU NEGATIVE 09/03/2015 0942   HGBUR TRACE-INTACT (A) 09/03/2015 0942   HGBUR trace-lysed  08/28/2010 0803   BILIRUBINUR n 06/19/2019 1131   KETONESUR NEGATIVE 09/03/2015 0942   PROTEINUR Negative 06/19/2019 1131   UROBILINOGEN 0.2 06/19/2019 1131   UROBILINOGEN 0.2 09/03/2015 0942   NITRITE n 06/19/2019 1131   NITRITE NEGATIVE 09/03/2015 0942   LEUKOCYTESUR Negative 06/19/2019 1131   Sepsis Labs Invalid input(s): PROCALCITONIN,  WBC,  LACTICIDVEN Microbiology Recent Results (from the past 240 hour(s))  SARS Coronavirus 2 (CEPHEID - Performed in [facility] hospital lab), Hosp Order     Status: None   Collection Time: 06/22/19 12:46 AM   Specimen: Nasopharyngeal Swab  Result Value Ref Range Status   SARS Coronavirus 2 NEGATIVE NEGATIVE Final    Comment: (NOTE) If result is NEGATIVE SARS-CoV-2 target nucleic acids are NOT DETECTED. The SARS-CoV-2 RNA is generally detectable in upper and lower  respiratory specimens during the acute phase of infection. The lowest  concentration of SARS-CoV-2 viral copies this assay can detect is 250  copies / mL. A negative result does not preclude SARS-CoV-2 infection  and should not be used as the sole basis for treatment or other  patient management decisions.  A negative result may  occur with  improper specimen collection / handling, submission of specimen other  than nasopharyngeal swab, presence of viral mutation(s) within the  areas targeted by this assay, and inadequate number of viral copies  (<250 copies / mL). A negative result must be combined with clinical  observations, patient history, and epidemiological information. If result is POSITIVE SARS-CoV-2 target nucleic acids are DETECTED. The SARS-CoV-2 RNA is generally detectable in upper and lower  respiratory specimens dur ing the acute phase of infection.  Positive  results are indicative of active infection with SARS-CoV-2.  Clinical  correlation with patient history and other diagnostic information is  necessary to determine patient infection status.  Positive results do  not rule out bacterial infection or co-infection with other viruses. If result is PRESUMPTIVE POSTIVE SARS-CoV-2 nucleic acids MAY BE PRESENT.   A presumptive positive result was obtained on the submitted specimen  and confirmed on repeat testing.  While 2019 novel coronavirus  (SARS-CoV-2) nucleic acids may be present in the submitted sample  additional confirmatory testing may be necessary for epidemiological  and / or clinical management purposes  to differentiate between  SARS-CoV-2 and other Sarbecovirus currently known to infect humans.  If clinically indicated additional testing with an alternate test  methodology [phone]) is advised. The SARS-CoV-2 RNA is generally  detectable in upper and lower respiratory sp ecimens during the acute  phase of infection. The expected result is Negative. Fact Sheet for Patients:  StrictlyIdeas.no Fact Sheet for Healthcare Providers: BankingDealers.co.za This test is not yet approved or cleared by the [place] FDA and has been authorized for detection and/or diagnosis of SARS-CoV-2 by FDA under an Emergency Use Authorization (EUA).  This  EUA will remain in effect (meaning this test can be used) for the duration of the COVID-19 declaration under Section 564(b)(1) of the Act, 21 U.S.C. section 360bbb-3(b)(1), unless the authorization is terminated or revoked sooner. Performed at [facility], [place] [address] [place], [place] [address]   Surgical pcr screen     Status: None   Collection Time: 06/22/19  2:19 AM   Specimen: Nasal Mucosa; Nasal Swab  Result Value Ref Range Status   MRSA, PCR NEGATIVE NEGATIVE Final   Staphylococcus aureus NEGATIVE NEGATIVE Final    Comment: (NOTE) The Xpert SA Assay (FDA approved for NASAL specimens  in patients 6 years of age and older), is one component of a comprehensive surveillance program. It is not intended to diagnose infection nor to guide or monitor treatment. Performed at [facility], [place] [address], [place], [place] [address]      Time coordinating discharge: 35 minutes  SIGNED:   Aline August, MD  [facility] 06/26/2019, 10:53 AM

## 2019-06-26 NOTE — TOC Transition Note (Signed)
Transition of Care [facility]) - CM/SW Discharge Note   Patient Details  Name: Emily Horton MRN: [id number] Date of Birth: 1946/05/09  [facility] CM/SW Contact:  Alberteen Sam, LCSW Phone Number: 06/26/2019, 1:05 PM   Clinical Narrative:     Patient will DC to: [facility] Anticipated DC date: 06/26/2019 Family notified:Mandy Transport WO:EHOZ  Per MD patient ready for DC to [facility] . RN, patient, patient's family, and facility notified of DC. Discharge Summary sent to facility. RN given number for report  [phone] Room 606. DC packet on chart. Ambulance transport requested for patient.  CSW signing off.  [place], [place]   Final next level of care: Skilled Nursing Facility Barriers to Discharge: No Barriers Identified   Patient Goals and CMS Choice Patient states their goals for this hospitalization and ongoing recovery are:: to go to rehab then home CMS Medicare.gov Compare Post Acute Care list provided to:: Patient Choice offered to / list presented to : Patient  Discharge Placement PASRR number recieved: 06/25/19            Patient chooses bed at: [facility] Patient to be transferred to facility by: [facility] Name of family member notified: Mandy Patient and family notified of of transfer: 06/26/19  Discharge Plan and Services     Post Acute Care Choice: [facility]                               Social Determinants of Health (SDOH) Interventions     Readmission Risk Interventions No flowsheet data found.

## 2019-06-26 NOTE — Progress Notes (Signed)
Physical Therapy Treatment Patient Details Name: Emily Horton MRN: [id number] DOB: Feb 02, 1946 Today's Date: 06/26/2019    History of Present Illness Emily Horton is a 73 y.o. female with with history of depression and GERD was brought to the ER after patient had a fall at home. Closed right intertrochanteric femur fracture, status post IM nail.    PT Comments    Pt performed gait training and functional mobility with max cues for encouragement.  She continues to complain of dizziness with positional changes.  Pt performed R LE exercises and reports pain lessened after completion.  She continues to benefit from skilled rehab at d/c to venue listed below.     Orthostatics: Supine -109/64 Sitting - 92/73 Standing - 99/67 Standing after 3 min - 99/62   Follow Up Recommendations  SNF;Other (comment)(vs. Home first with [facility].)     Equipment Recommendations  Rolling walker with 5" wheels;3in1 (PT)    Recommendations for Other Services OT consult     Precautions / Restrictions Precautions Precautions: Fall Precaution Comments: Watch orthostatics Restrictions Weight Bearing Restrictions: Yes RLE Weight Bearing: Weight bearing as tolerated    Mobility  Bed Mobility Overal bed mobility: Needs Assistance Bed Mobility: Supine to Sit;Sit to Supine     Supine to sit: Min assist Sit to supine: Min assist   General bed mobility comments: Pt performed supine to sit and sit to supine with assistance for trunk elevation and to return LEs back to bed.  She does report dizziness sitting edge of bed.  Transfers Overall transfer level: Needs assistance Equipment used: Rolling walker (2 wheeled)   Sit to Stand: Min guard         General transfer comment: Cues for hand placement to and from seated surface.  Reports dizziness in standing.  Ambulation/Gait Ambulation/Gait assistance: Min guard Gait Distance (Feet): 40 Feet Assistive device: Rolling walker (2 wheeled) Gait  Pattern/deviations: Step-to pattern;Trunk flexed;Antalgic     General Gait Details: Cues for weight bearing on RLE.  Cues for UE use for off setting weight due to pain.  Pt performed  shortened gt trial due to pain and dizziness and continues to require min guard for safety.   Stairs             Wheelchair Mobility    Modified Rankin (Stroke Patients Only)       Balance     Sitting balance-Leahy Scale: Good       Standing balance-Leahy Scale: Fair                              Cognition Arousal/Alertness: Awake/alert Behavior During Therapy: WFL for tasks assessed/performed Overall Cognitive Status: Within Functional Limits for tasks assessed                                        Exercises Total Joint Exercises Ankle Circles/Pumps: AROM;Both;20 reps;Supine Quad Sets: AROM;Right;10 reps;Supine Short Arc Quad: AROM;Right;10 reps;Supine Heel Slides: Right;10 reps;Supine;AAROM Hip ABduction/ADduction: Right;10 reps;Supine;AAROM Straight Leg Raises: Right;10 reps;Supine;AAROM    General Comments        Pertinent Vitals/Pain Pain Assessment: Faces Faces Pain Scale: Hurts even more Pain Location: Rt hip Pain Descriptors / Indicators: Aching Pain Intervention(s): Monitored during session;Repositioned    Home Living  Prior Function            PT Goals (current goals can now be found in the care plan section) Acute Rehab PT Goals Patient Stated Goal: Get well Potential to Achieve Goals: Good Progress towards PT goals: Progressing toward goals    Frequency    Min 3X/week      PT Plan Current plan remains appropriate    Co-evaluation              AM-PAC PT "6 Clicks" Mobility   Outcome Measure  Help needed turning from your back to your side while in a flat bed without using bedrails?: A Little Help needed moving from lying on your back to sitting on the side of a flat bed without  using bedrails?: A Little Help needed moving to and from a bed to a chair (including a wheelchair)?: A Little Help needed standing up from a chair using your arms (e.g., wheelchair or bedside chair)?: A Little Help needed to walk in hospital room?: A Little Help needed climbing 3-5 steps with a railing? : A Little 6 Click Score: 18    End of Session Equipment Utilized During Treatment: Gait belt Activity Tolerance: Patient tolerated treatment well;Other (comment) Patient left: in chair;with call bell/phone within reach Nurse Communication: Mobility status PT Visit Diagnosis: Unsteadiness on feet (R26.81);History of falling (Z91.81);Difficulty in walking, not elsewhere classified (R26.2);Pain Pain - Right/Left: Right Pain - part of body: Hip     Time: 8938-1017 PT Time Calculation (min) (ACUTE ONLY): 31 min  Charges:  $Gait Training: 8-22 mins $Therapeutic Exercise: 8-22 mins                     Governor Rooks, PTA Acute Rehabilitation Services Pager [phone] Office [phone]     Wilmot Quevedo Eli Hose 06/26/2019, 1:31 PM

## 2019-06-27 DIAGNOSIS — F329 Major depressive disorder, single episode, unspecified: Secondary | ICD-10-CM | POA: Diagnosis not present

## 2019-06-27 DIAGNOSIS — E785 Hyperlipidemia, unspecified: Secondary | ICD-10-CM | POA: Diagnosis not present

## 2019-06-27 DIAGNOSIS — K589 Irritable bowel syndrome without diarrhea: Secondary | ICD-10-CM | POA: Diagnosis not present

## 2019-06-27 DIAGNOSIS — L309 Dermatitis, unspecified: Secondary | ICD-10-CM | POA: Diagnosis not present

## 2019-06-27 DIAGNOSIS — R52 Pain, unspecified: Secondary | ICD-10-CM | POA: Diagnosis not present

## 2019-06-27 DIAGNOSIS — J309 Allergic rhinitis, unspecified: Secondary | ICD-10-CM | POA: Diagnosis not present

## 2019-06-27 DIAGNOSIS — G8929 Other chronic pain: Secondary | ICD-10-CM | POA: Diagnosis not present

## 2019-06-27 DIAGNOSIS — T7808XS Anaphylactic reaction due to eggs, sequela: Secondary | ICD-10-CM | POA: Diagnosis not present

## 2019-06-27 DIAGNOSIS — E559 Vitamin D deficiency, unspecified: Secondary | ICD-10-CM | POA: Diagnosis not present

## 2019-06-27 DIAGNOSIS — K219 Gastro-esophageal reflux disease without esophagitis: Secondary | ICD-10-CM | POA: Diagnosis not present

## 2019-06-27 DIAGNOSIS — K5900 Constipation, unspecified: Secondary | ICD-10-CM | POA: Diagnosis not present

## 2019-06-27 DIAGNOSIS — S72001D Fracture of unspecified part of neck of right femur, subsequent encounter for closed fracture with routine healing: Secondary | ICD-10-CM | POA: Diagnosis not present

## 2019-06-29 DIAGNOSIS — R52 Pain, unspecified: Secondary | ICD-10-CM | POA: Diagnosis not present

## 2019-06-29 DIAGNOSIS — L309 Dermatitis, unspecified: Secondary | ICD-10-CM | POA: Diagnosis not present

## 2019-06-29 DIAGNOSIS — K589 Irritable bowel syndrome without diarrhea: Secondary | ICD-10-CM | POA: Diagnosis not present

## 2019-06-29 DIAGNOSIS — K219 Gastro-esophageal reflux disease without esophagitis: Secondary | ICD-10-CM | POA: Diagnosis not present

## 2019-06-29 DIAGNOSIS — E785 Hyperlipidemia, unspecified: Secondary | ICD-10-CM | POA: Diagnosis not present

## 2019-06-29 DIAGNOSIS — F329 Major depressive disorder, single episode, unspecified: Secondary | ICD-10-CM | POA: Diagnosis not present

## 2019-06-29 DIAGNOSIS — G8929 Other chronic pain: Secondary | ICD-10-CM | POA: Diagnosis not present

## 2019-06-29 DIAGNOSIS — T7808XS Anaphylactic reaction due to eggs, sequela: Secondary | ICD-10-CM | POA: Diagnosis not present

## 2019-06-29 DIAGNOSIS — K5900 Constipation, unspecified: Secondary | ICD-10-CM | POA: Diagnosis not present

## 2019-06-29 DIAGNOSIS — J309 Allergic rhinitis, unspecified: Secondary | ICD-10-CM | POA: Diagnosis not present

## 2019-06-29 DIAGNOSIS — E559 Vitamin D deficiency, unspecified: Secondary | ICD-10-CM | POA: Diagnosis not present

## 2019-06-29 DIAGNOSIS — S72001D Fracture of unspecified part of neck of right femur, subsequent encounter for closed fracture with routine healing: Secondary | ICD-10-CM | POA: Diagnosis not present

## 2019-07-03 DIAGNOSIS — K219 Gastro-esophageal reflux disease without esophagitis: Secondary | ICD-10-CM | POA: Diagnosis not present

## 2019-07-03 DIAGNOSIS — Z7409 Other reduced mobility: Secondary | ICD-10-CM | POA: Diagnosis not present

## 2019-07-03 DIAGNOSIS — K581 Irritable bowel syndrome with constipation: Secondary | ICD-10-CM | POA: Diagnosis not present

## 2019-07-03 DIAGNOSIS — D649 Anemia, unspecified: Secondary | ICD-10-CM | POA: Diagnosis not present

## 2019-07-03 DIAGNOSIS — L2089 Other atopic dermatitis: Secondary | ICD-10-CM | POA: Diagnosis not present

## 2019-07-03 DIAGNOSIS — F3289 Other specified depressive episodes: Secondary | ICD-10-CM | POA: Diagnosis not present

## 2019-07-03 DIAGNOSIS — E559 Vitamin D deficiency, unspecified: Secondary | ICD-10-CM | POA: Diagnosis not present

## 2019-07-03 DIAGNOSIS — G8911 Acute pain due to trauma: Secondary | ICD-10-CM | POA: Diagnosis not present

## 2019-07-03 DIAGNOSIS — M80051D Age-related osteoporosis with current pathological fracture, right femur, subsequent encounter for fracture with routine healing: Secondary | ICD-10-CM | POA: Diagnosis not present

## 2019-07-04 DIAGNOSIS — B379 Candidiasis, unspecified: Secondary | ICD-10-CM | POA: Diagnosis not present

## 2019-07-04 DIAGNOSIS — Z7409 Other reduced mobility: Secondary | ICD-10-CM | POA: Diagnosis not present

## 2019-07-04 DIAGNOSIS — G8911 Acute pain due to trauma: Secondary | ICD-10-CM | POA: Diagnosis not present

## 2019-07-04 DIAGNOSIS — K219 Gastro-esophageal reflux disease without esophagitis: Secondary | ICD-10-CM | POA: Diagnosis not present

## 2019-07-04 DIAGNOSIS — F3289 Other specified depressive episodes: Secondary | ICD-10-CM | POA: Diagnosis not present

## 2019-07-04 DIAGNOSIS — E559 Vitamin D deficiency, unspecified: Secondary | ICD-10-CM | POA: Diagnosis not present

## 2019-07-04 DIAGNOSIS — K581 Irritable bowel syndrome with constipation: Secondary | ICD-10-CM | POA: Diagnosis not present

## 2019-07-04 DIAGNOSIS — F331 Major depressive disorder, recurrent, moderate: Secondary | ICD-10-CM | POA: Diagnosis not present

## 2019-07-04 DIAGNOSIS — D649 Anemia, unspecified: Secondary | ICD-10-CM | POA: Diagnosis not present

## 2019-07-04 DIAGNOSIS — J309 Allergic rhinitis, unspecified: Secondary | ICD-10-CM | POA: Diagnosis not present

## 2019-07-04 DIAGNOSIS — M80051D Age-related osteoporosis with current pathological fracture, right femur, subsequent encounter for fracture with routine healing: Secondary | ICD-10-CM | POA: Diagnosis not present

## 2019-07-04 DIAGNOSIS — L2089 Other atopic dermatitis: Secondary | ICD-10-CM | POA: Diagnosis not present

## 2019-07-09 ENCOUNTER — Other Ambulatory Visit: Payer: Self-pay | Admitting: *Deleted

## 2019-07-09 DIAGNOSIS — E559 Vitamin D deficiency, unspecified: Secondary | ICD-10-CM | POA: Diagnosis not present

## 2019-07-09 DIAGNOSIS — K219 Gastro-esophageal reflux disease without esophagitis: Secondary | ICD-10-CM | POA: Diagnosis not present

## 2019-07-09 DIAGNOSIS — M80051D Age-related osteoporosis with current pathological fracture, right femur, subsequent encounter for fracture with routine healing: Secondary | ICD-10-CM | POA: Diagnosis not present

## 2019-07-09 DIAGNOSIS — S72001D Fracture of unspecified part of neck of right femur, subsequent encounter for closed fracture with routine healing: Secondary | ICD-10-CM | POA: Diagnosis not present

## 2019-07-09 DIAGNOSIS — F3289 Other specified depressive episodes: Secondary | ICD-10-CM | POA: Diagnosis not present

## 2019-07-09 DIAGNOSIS — G8911 Acute pain due to trauma: Secondary | ICD-10-CM | POA: Diagnosis not present

## 2019-07-09 DIAGNOSIS — Z7409 Other reduced mobility: Secondary | ICD-10-CM | POA: Diagnosis not present

## 2019-07-09 DIAGNOSIS — B379 Candidiasis, unspecified: Secondary | ICD-10-CM | POA: Diagnosis not present

## 2019-07-09 DIAGNOSIS — D649 Anemia, unspecified: Secondary | ICD-10-CM | POA: Diagnosis not present

## 2019-07-09 DIAGNOSIS — K581 Irritable bowel syndrome with constipation: Secondary | ICD-10-CM | POA: Diagnosis not present

## 2019-07-09 DIAGNOSIS — G8929 Other chronic pain: Secondary | ICD-10-CM | POA: Diagnosis not present

## 2019-07-09 DIAGNOSIS — F331 Major depressive disorder, recurrent, moderate: Secondary | ICD-10-CM | POA: Diagnosis not present

## 2019-07-09 NOTE — Patient Outreach (Signed)
Member assessed for potential [facility] Care Management needs as a benefit of  [place] Medicare.  Member is currently receiving rehab therapy at [facility].  [facility] UM RN made writer aware that per facility,  member's disposition plan is for home. Facility has recommended to member's daughter that Emily Horton will need 24/7 supervision post SNF. However, member will return home alone as daughter reports she is unable to provide 24/7, per facility. Family does live nearby however.   [facility] UM RN requests writer follow up regarding [place] Management services.  Will make outreach to member to discuss [place] Management services closer to SNF discharge. Per [facility] UM RN, tentative dc date from SNF is 07/16/19.    [name], MSN-Ed, RN,BSN [place] Acute Care Coordinator [phone] [facility]) [phone]  (Toll free office)

## 2019-07-11 DIAGNOSIS — R296 Repeated falls: Secondary | ICD-10-CM | POA: Diagnosis not present

## 2019-07-13 ENCOUNTER — Other Ambulatory Visit: Payer: Self-pay | Admitting: *Deleted

## 2019-07-13 DIAGNOSIS — T7840XS Allergy, unspecified, sequela: Secondary | ICD-10-CM | POA: Diagnosis not present

## 2019-07-13 DIAGNOSIS — F3289 Other specified depressive episodes: Secondary | ICD-10-CM | POA: Diagnosis not present

## 2019-07-13 DIAGNOSIS — F419 Anxiety disorder, unspecified: Secondary | ICD-10-CM | POA: Diagnosis not present

## 2019-07-13 DIAGNOSIS — B379 Candidiasis, unspecified: Secondary | ICD-10-CM | POA: Diagnosis not present

## 2019-07-13 DIAGNOSIS — R296 Repeated falls: Secondary | ICD-10-CM | POA: Diagnosis not present

## 2019-07-13 DIAGNOSIS — D689 Coagulation defect, unspecified: Secondary | ICD-10-CM | POA: Diagnosis not present

## 2019-07-13 DIAGNOSIS — J309 Allergic rhinitis, unspecified: Secondary | ICD-10-CM | POA: Diagnosis not present

## 2019-07-13 DIAGNOSIS — M80051D Age-related osteoporosis with current pathological fracture, right femur, subsequent encounter for fracture with routine healing: Secondary | ICD-10-CM | POA: Diagnosis not present

## 2019-07-13 DIAGNOSIS — G8911 Acute pain due to trauma: Secondary | ICD-10-CM | POA: Diagnosis not present

## 2019-07-13 DIAGNOSIS — R21 Rash and other nonspecific skin eruption: Secondary | ICD-10-CM | POA: Diagnosis not present

## 2019-07-13 DIAGNOSIS — K5900 Constipation, unspecified: Secondary | ICD-10-CM | POA: Diagnosis not present

## 2019-07-13 DIAGNOSIS — K219 Gastro-esophageal reflux disease without esophagitis: Secondary | ICD-10-CM | POA: Diagnosis not present

## 2019-07-13 NOTE — Patient Outreach (Signed)
Member assessed for potential [facility] Care Management needs as a benefit of [place] Medicare.  Emily Horton is currently receiving rehab therapy at [facility].  Telephone call made to Emily Horton at [phone]. Patient identifiers confirmed.   Emily Horton reports she will return home and her daughter will stay with her "as long as she needs to." Emily Horton states she is very nervous going home and that "it is scary to go home." States her stay in SNF has been extended until Thursday. States "something is wrong with my leg and I fell. I have a doctor's appointment on Wednesday."   Emily Horton is agreeable to [place] Management services. Explained that [place] Management will not interfere or replace services provided by home health.  Confirmed Primary Care MD is Dr. Sarajane Jews.  Confirmed best contact number is  [phone].  Will make referral for [place] upon SNF discharge.   [name], MSN-Ed, RN,BSN [place] Acute Care Coordinator [phone] [facility]) [phone]  (Toll free office)

## 2019-07-18 DIAGNOSIS — S72141D Displaced intertrochanteric fracture of right femur, subsequent encounter for closed fracture with routine healing: Secondary | ICD-10-CM | POA: Diagnosis not present

## 2019-07-18 DIAGNOSIS — S72001D Fracture of unspecified part of neck of right femur, subsequent encounter for closed fracture with routine healing: Secondary | ICD-10-CM | POA: Diagnosis not present

## 2019-07-19 DIAGNOSIS — G8911 Acute pain due to trauma: Secondary | ICD-10-CM | POA: Diagnosis not present

## 2019-07-19 DIAGNOSIS — T7840XS Allergy, unspecified, sequela: Secondary | ICD-10-CM | POA: Diagnosis not present

## 2019-07-19 DIAGNOSIS — K5900 Constipation, unspecified: Secondary | ICD-10-CM | POA: Diagnosis not present

## 2019-07-19 DIAGNOSIS — F419 Anxiety disorder, unspecified: Secondary | ICD-10-CM | POA: Diagnosis not present

## 2019-07-19 DIAGNOSIS — D689 Coagulation defect, unspecified: Secondary | ICD-10-CM | POA: Diagnosis not present

## 2019-07-19 DIAGNOSIS — R51 Headache: Secondary | ICD-10-CM | POA: Diagnosis not present

## 2019-07-19 DIAGNOSIS — D649 Anemia, unspecified: Secondary | ICD-10-CM | POA: Diagnosis not present

## 2019-07-19 DIAGNOSIS — R21 Rash and other nonspecific skin eruption: Secondary | ICD-10-CM | POA: Diagnosis not present

## 2019-07-19 DIAGNOSIS — K219 Gastro-esophageal reflux disease without esophagitis: Secondary | ICD-10-CM | POA: Diagnosis not present

## 2019-07-19 DIAGNOSIS — J309 Allergic rhinitis, unspecified: Secondary | ICD-10-CM | POA: Diagnosis not present

## 2019-07-19 DIAGNOSIS — M80051D Age-related osteoporosis with current pathological fracture, right femur, subsequent encounter for fracture with routine healing: Secondary | ICD-10-CM | POA: Diagnosis not present

## 2019-07-19 DIAGNOSIS — F3289 Other specified depressive episodes: Secondary | ICD-10-CM | POA: Diagnosis not present

## 2019-07-20 ENCOUNTER — Telehealth: Payer: Self-pay | Admitting: Family Medicine

## 2019-07-20 DIAGNOSIS — E559 Vitamin D deficiency, unspecified: Secondary | ICD-10-CM | POA: Diagnosis not present

## 2019-07-20 DIAGNOSIS — R32 Unspecified urinary incontinence: Secondary | ICD-10-CM | POA: Diagnosis not present

## 2019-07-20 DIAGNOSIS — R296 Repeated falls: Secondary | ICD-10-CM | POA: Diagnosis not present

## 2019-07-20 DIAGNOSIS — S72141D Displaced intertrochanteric fracture of right femur, subsequent encounter for closed fracture with routine healing: Secondary | ICD-10-CM | POA: Diagnosis not present

## 2019-07-20 DIAGNOSIS — M6281 Muscle weakness (generalized): Secondary | ICD-10-CM | POA: Diagnosis not present

## 2019-07-20 DIAGNOSIS — F329 Major depressive disorder, single episode, unspecified: Secondary | ICD-10-CM | POA: Diagnosis not present

## 2019-07-20 DIAGNOSIS — K219 Gastro-esophageal reflux disease without esophagitis: Secondary | ICD-10-CM | POA: Diagnosis not present

## 2019-07-20 NOTE — Telephone Encounter (Signed)
Ok for orders? 

## 2019-07-20 NOTE — Telephone Encounter (Signed)
Please okay the orders  

## 2019-07-20 NOTE — Telephone Encounter (Signed)
Attempted to call number given and was sent to a robo-call for direct TV. Will call the patient to see if she has the correct phone number.

## 2019-07-20 NOTE — Telephone Encounter (Signed)
Ben calling from [facility] to get verbals for pt. Frequency 2x6   OT: 1x1    Cb#[phone]

## 2019-07-23 DIAGNOSIS — R296 Repeated falls: Secondary | ICD-10-CM | POA: Diagnosis not present

## 2019-07-23 DIAGNOSIS — K219 Gastro-esophageal reflux disease without esophagitis: Secondary | ICD-10-CM | POA: Diagnosis not present

## 2019-07-23 DIAGNOSIS — E559 Vitamin D deficiency, unspecified: Secondary | ICD-10-CM | POA: Diagnosis not present

## 2019-07-23 DIAGNOSIS — S72141D Displaced intertrochanteric fracture of right femur, subsequent encounter for closed fracture with routine healing: Secondary | ICD-10-CM | POA: Diagnosis not present

## 2019-07-23 DIAGNOSIS — F329 Major depressive disorder, single episode, unspecified: Secondary | ICD-10-CM | POA: Diagnosis not present

## 2019-07-23 DIAGNOSIS — R32 Unspecified urinary incontinence: Secondary | ICD-10-CM | POA: Diagnosis not present

## 2019-07-23 NOTE — Telephone Encounter (Signed)
Spoke with patient and was given the number for [facility], [phone]. Spoke with the nurse at [facility], orders have been given. Nothing further needed.

## 2019-07-24 DIAGNOSIS — E559 Vitamin D deficiency, unspecified: Secondary | ICD-10-CM | POA: Diagnosis not present

## 2019-07-24 DIAGNOSIS — K219 Gastro-esophageal reflux disease without esophagitis: Secondary | ICD-10-CM | POA: Diagnosis not present

## 2019-07-24 DIAGNOSIS — S72141D Displaced intertrochanteric fracture of right femur, subsequent encounter for closed fracture with routine healing: Secondary | ICD-10-CM | POA: Diagnosis not present

## 2019-07-24 DIAGNOSIS — R296 Repeated falls: Secondary | ICD-10-CM | POA: Diagnosis not present

## 2019-07-24 DIAGNOSIS — R32 Unspecified urinary incontinence: Secondary | ICD-10-CM | POA: Diagnosis not present

## 2019-07-24 DIAGNOSIS — F329 Major depressive disorder, single episode, unspecified: Secondary | ICD-10-CM | POA: Diagnosis not present

## 2019-07-25 ENCOUNTER — Other Ambulatory Visit: Payer: Self-pay | Admitting: *Deleted

## 2019-07-25 DIAGNOSIS — K219 Gastro-esophageal reflux disease without esophagitis: Secondary | ICD-10-CM

## 2019-07-25 NOTE — Patient Outreach (Signed)
Member assessed for potential [facility] Care Management needs as a benefit of  [place] Medicare.  Confirmed in Patient Emily Horton that [name] discharged home from [facility] SNF to home on 07/20/19. It appears she will have [facility].  Writer previously spoke with [name] on 07/13/19 about [place] Management services and she was agreeable at that time.  Will make referral to [facility] for complex case management. [name] lived alone prior with history of fall, depression, GERD, HLD.    [name], MSN-Ed, RN,BSN [place] Acute Care Coordinator [phone] [facility]) [phone]  (Toll free office)

## 2019-07-26 ENCOUNTER — Other Ambulatory Visit: Payer: Self-pay | Admitting: *Deleted

## 2019-07-26 DIAGNOSIS — R32 Unspecified urinary incontinence: Secondary | ICD-10-CM | POA: Diagnosis not present

## 2019-07-26 DIAGNOSIS — E559 Vitamin D deficiency, unspecified: Secondary | ICD-10-CM | POA: Diagnosis not present

## 2019-07-26 DIAGNOSIS — F329 Major depressive disorder, single episode, unspecified: Secondary | ICD-10-CM | POA: Diagnosis not present

## 2019-07-26 DIAGNOSIS — S72141D Displaced intertrochanteric fracture of right femur, subsequent encounter for closed fracture with routine healing: Secondary | ICD-10-CM | POA: Diagnosis not present

## 2019-07-26 DIAGNOSIS — R296 Repeated falls: Secondary | ICD-10-CM | POA: Diagnosis not present

## 2019-07-26 DIAGNOSIS — K219 Gastro-esophageal reflux disease without esophagitis: Secondary | ICD-10-CM | POA: Diagnosis not present

## 2019-07-26 NOTE — Patient Outreach (Signed)
[place] [facility]) Care Management  07/26/2019  Emily Horton [birth year] [id number]   Referral received from post acute care coordinator as member was discharged on 8/14 from SNF.  Was at [facility] for rehab to recover from hip fracture due to fall.  Per chart, she also has history of GERD, IBS, depression, and dyslipidemia.  Call placed to member, no answer.  HIPAA compliant voice message left.  Unsuccessful outreach letter sent.  Will follow up within the next 3-4 business days.    Update:  Call received back from member, identity verified.  This care manager introduced self and stated purpose of call.  [facility] care management services explained.  She report her daughter has been staying with her since her discharge.  State she is doing much better, confirms she has started working with [facility] for home health PT.  Voices frustration regarding DME that was to be delivered prior to her discharge (walker, BSC, and shower chair).  She never received equipment and is concerned it was already processed through her insurance.  She and her children have been able to borrow equipment from friends/family, but she is still interested in getting her own equipment.  Report she has seen orthopedic specialist since her discharge, has another follow up appointment next month.  Was last seen by her primary MD prior to hospitalization, follow up within the next 6 months.  State she has all of her medications, denies need for financial or adherence assistance.  Has daughter and son for support, denies needing additional support in the home.  State main/only concern is DME, denies needs otherwise.  Call placed to [facility] coordinator to inquire about plans for DME delivery upon discharge from SNF.  She reached out to [facility] discharge planner for clarification of DME order.  Notified request was placed with [facility] [employer] and that they attempted to contact member with no response back.  Call placed back to  member to provide contact information for [facility] [employer].  Advised that she and/or daughter contact them for further instructions regarding equipment.    Will follow up with member within the next week.  If she continue to deny needs will close case at that time.  Valente David, [place], MSN [place] [phone]

## 2019-07-26 NOTE — Patient Outreach (Addendum)
Received telephone call from [place] who indicates Emily Horton has not received DME post [facility] SNF discharge. She has been home a week. States member was able to "borrow" equipment but is concerned that she has not yet received the equipment she was promised.   Voicemail message left and notification sent for [facility] SNF discharge planner to make aware that member's equipment- shower chair, bedside commode, and walker- has not been delivered and inquiry made on what DME company was used.   Will await to hear back from [place] discharge planner. Will update [facility] RNCM once response is received from discharge planner.   Addendum: late entry for 07/26/19. Received update from [facility] discharge planner who reports DME was ordered thru [facility]. DC planner reports that message was left for daughter but did not receive a returned call. States Juliann Pulse with [employer] will outreach again in attempt to deliver DME. Writer updated [place] with DME information.    [name], MSN-Ed, RN,BSN [place] Acute Care Coordinator [phone] [facility]) [phone]  (Toll free office)

## 2019-07-27 DIAGNOSIS — S72141D Displaced intertrochanteric fracture of right femur, subsequent encounter for closed fracture with routine healing: Secondary | ICD-10-CM | POA: Diagnosis not present

## 2019-07-27 DIAGNOSIS — F329 Major depressive disorder, single episode, unspecified: Secondary | ICD-10-CM | POA: Diagnosis not present

## 2019-07-27 DIAGNOSIS — R32 Unspecified urinary incontinence: Secondary | ICD-10-CM | POA: Diagnosis not present

## 2019-07-27 DIAGNOSIS — R296 Repeated falls: Secondary | ICD-10-CM | POA: Diagnosis not present

## 2019-07-27 DIAGNOSIS — E559 Vitamin D deficiency, unspecified: Secondary | ICD-10-CM | POA: Diagnosis not present

## 2019-07-27 DIAGNOSIS — K219 Gastro-esophageal reflux disease without esophagitis: Secondary | ICD-10-CM | POA: Diagnosis not present

## 2019-07-30 DIAGNOSIS — E559 Vitamin D deficiency, unspecified: Secondary | ICD-10-CM | POA: Diagnosis not present

## 2019-07-30 DIAGNOSIS — R32 Unspecified urinary incontinence: Secondary | ICD-10-CM | POA: Diagnosis not present

## 2019-07-30 DIAGNOSIS — S72141D Displaced intertrochanteric fracture of right femur, subsequent encounter for closed fracture with routine healing: Secondary | ICD-10-CM | POA: Diagnosis not present

## 2019-07-30 DIAGNOSIS — R296 Repeated falls: Secondary | ICD-10-CM | POA: Diagnosis not present

## 2019-07-30 DIAGNOSIS — K219 Gastro-esophageal reflux disease without esophagitis: Secondary | ICD-10-CM | POA: Diagnosis not present

## 2019-07-30 DIAGNOSIS — F329 Major depressive disorder, single episode, unspecified: Secondary | ICD-10-CM | POA: Diagnosis not present

## 2019-07-31 ENCOUNTER — Other Ambulatory Visit: Payer: Self-pay | Admitting: *Deleted

## 2019-07-31 DIAGNOSIS — R32 Unspecified urinary incontinence: Secondary | ICD-10-CM | POA: Diagnosis not present

## 2019-07-31 DIAGNOSIS — S72141D Displaced intertrochanteric fracture of right femur, subsequent encounter for closed fracture with routine healing: Secondary | ICD-10-CM | POA: Diagnosis not present

## 2019-07-31 DIAGNOSIS — R296 Repeated falls: Secondary | ICD-10-CM | POA: Diagnosis not present

## 2019-07-31 DIAGNOSIS — F329 Major depressive disorder, single episode, unspecified: Secondary | ICD-10-CM | POA: Diagnosis not present

## 2019-07-31 DIAGNOSIS — E559 Vitamin D deficiency, unspecified: Secondary | ICD-10-CM | POA: Diagnosis not present

## 2019-07-31 DIAGNOSIS — K219 Gastro-esophageal reflux disease without esophagitis: Secondary | ICD-10-CM | POA: Diagnosis not present

## 2019-07-31 NOTE — Patient Outreach (Signed)
[facility]) Care Management  07/31/2019  Emily Horton 1946/01/28 [id number]   Call placed to member to follow up on needs and DME.  She report she has received all equipment, denies any further needs.  Daughter remains in the home with her, active with home health for nursing and PT.  Will close case at this time, member state she will contact this care manager should needs change.  Will notify primary MD of case closure.  Emily Horton, [place], MSN [place] [phone]

## 2019-08-01 DIAGNOSIS — E559 Vitamin D deficiency, unspecified: Secondary | ICD-10-CM | POA: Diagnosis not present

## 2019-08-01 DIAGNOSIS — K219 Gastro-esophageal reflux disease without esophagitis: Secondary | ICD-10-CM | POA: Diagnosis not present

## 2019-08-01 DIAGNOSIS — R32 Unspecified urinary incontinence: Secondary | ICD-10-CM | POA: Diagnosis not present

## 2019-08-01 DIAGNOSIS — S72141D Displaced intertrochanteric fracture of right femur, subsequent encounter for closed fracture with routine healing: Secondary | ICD-10-CM | POA: Diagnosis not present

## 2019-08-01 DIAGNOSIS — R296 Repeated falls: Secondary | ICD-10-CM | POA: Diagnosis not present

## 2019-08-01 DIAGNOSIS — F329 Major depressive disorder, single episode, unspecified: Secondary | ICD-10-CM | POA: Diagnosis not present

## 2019-08-02 ENCOUNTER — Other Ambulatory Visit: Payer: Self-pay | Admitting: Family Medicine

## 2019-08-06 DIAGNOSIS — S72141D Displaced intertrochanteric fracture of right femur, subsequent encounter for closed fracture with routine healing: Secondary | ICD-10-CM | POA: Diagnosis not present

## 2019-08-06 DIAGNOSIS — R32 Unspecified urinary incontinence: Secondary | ICD-10-CM | POA: Diagnosis not present

## 2019-08-06 DIAGNOSIS — K219 Gastro-esophageal reflux disease without esophagitis: Secondary | ICD-10-CM | POA: Diagnosis not present

## 2019-08-06 DIAGNOSIS — F329 Major depressive disorder, single episode, unspecified: Secondary | ICD-10-CM | POA: Diagnosis not present

## 2019-08-06 DIAGNOSIS — R296 Repeated falls: Secondary | ICD-10-CM | POA: Diagnosis not present

## 2019-08-06 DIAGNOSIS — E559 Vitamin D deficiency, unspecified: Secondary | ICD-10-CM | POA: Diagnosis not present

## 2019-08-08 DIAGNOSIS — F329 Major depressive disorder, single episode, unspecified: Secondary | ICD-10-CM | POA: Diagnosis not present

## 2019-08-08 DIAGNOSIS — K219 Gastro-esophageal reflux disease without esophagitis: Secondary | ICD-10-CM | POA: Diagnosis not present

## 2019-08-08 DIAGNOSIS — R296 Repeated falls: Secondary | ICD-10-CM | POA: Diagnosis not present

## 2019-08-08 DIAGNOSIS — S72141D Displaced intertrochanteric fracture of right femur, subsequent encounter for closed fracture with routine healing: Secondary | ICD-10-CM | POA: Diagnosis not present

## 2019-08-08 DIAGNOSIS — R32 Unspecified urinary incontinence: Secondary | ICD-10-CM | POA: Diagnosis not present

## 2019-08-08 DIAGNOSIS — E559 Vitamin D deficiency, unspecified: Secondary | ICD-10-CM | POA: Diagnosis not present

## 2019-08-14 DIAGNOSIS — E559 Vitamin D deficiency, unspecified: Secondary | ICD-10-CM | POA: Diagnosis not present

## 2019-08-14 DIAGNOSIS — R296 Repeated falls: Secondary | ICD-10-CM | POA: Diagnosis not present

## 2019-08-14 DIAGNOSIS — F329 Major depressive disorder, single episode, unspecified: Secondary | ICD-10-CM | POA: Diagnosis not present

## 2019-08-14 DIAGNOSIS — S72141D Displaced intertrochanteric fracture of right femur, subsequent encounter for closed fracture with routine healing: Secondary | ICD-10-CM | POA: Diagnosis not present

## 2019-08-14 DIAGNOSIS — K219 Gastro-esophageal reflux disease without esophagitis: Secondary | ICD-10-CM | POA: Diagnosis not present

## 2019-08-14 DIAGNOSIS — R32 Unspecified urinary incontinence: Secondary | ICD-10-CM | POA: Diagnosis not present

## 2019-08-16 DIAGNOSIS — E559 Vitamin D deficiency, unspecified: Secondary | ICD-10-CM | POA: Diagnosis not present

## 2019-08-16 DIAGNOSIS — S72141D Displaced intertrochanteric fracture of right femur, subsequent encounter for closed fracture with routine healing: Secondary | ICD-10-CM | POA: Diagnosis not present

## 2019-08-16 DIAGNOSIS — K219 Gastro-esophageal reflux disease without esophagitis: Secondary | ICD-10-CM | POA: Diagnosis not present

## 2019-08-16 DIAGNOSIS — R32 Unspecified urinary incontinence: Secondary | ICD-10-CM | POA: Diagnosis not present

## 2019-08-16 DIAGNOSIS — R296 Repeated falls: Secondary | ICD-10-CM | POA: Diagnosis not present

## 2019-08-16 DIAGNOSIS — F329 Major depressive disorder, single episode, unspecified: Secondary | ICD-10-CM | POA: Diagnosis not present

## 2019-08-17 DIAGNOSIS — R32 Unspecified urinary incontinence: Secondary | ICD-10-CM | POA: Diagnosis not present

## 2019-08-17 DIAGNOSIS — K219 Gastro-esophageal reflux disease without esophagitis: Secondary | ICD-10-CM | POA: Diagnosis not present

## 2019-08-17 DIAGNOSIS — F329 Major depressive disorder, single episode, unspecified: Secondary | ICD-10-CM | POA: Diagnosis not present

## 2019-08-17 DIAGNOSIS — S72141D Displaced intertrochanteric fracture of right femur, subsequent encounter for closed fracture with routine healing: Secondary | ICD-10-CM | POA: Diagnosis not present

## 2019-08-17 DIAGNOSIS — R296 Repeated falls: Secondary | ICD-10-CM | POA: Diagnosis not present

## 2019-08-17 DIAGNOSIS — E559 Vitamin D deficiency, unspecified: Secondary | ICD-10-CM | POA: Diagnosis not present

## 2019-08-19 DIAGNOSIS — M6281 Muscle weakness (generalized): Secondary | ICD-10-CM | POA: Diagnosis not present

## 2019-08-19 DIAGNOSIS — R32 Unspecified urinary incontinence: Secondary | ICD-10-CM | POA: Diagnosis not present

## 2019-08-19 DIAGNOSIS — F329 Major depressive disorder, single episode, unspecified: Secondary | ICD-10-CM | POA: Diagnosis not present

## 2019-08-19 DIAGNOSIS — E559 Vitamin D deficiency, unspecified: Secondary | ICD-10-CM | POA: Diagnosis not present

## 2019-08-19 DIAGNOSIS — K219 Gastro-esophageal reflux disease without esophagitis: Secondary | ICD-10-CM | POA: Diagnosis not present

## 2019-08-19 DIAGNOSIS — R296 Repeated falls: Secondary | ICD-10-CM | POA: Diagnosis not present

## 2019-08-19 DIAGNOSIS — S72141D Displaced intertrochanteric fracture of right femur, subsequent encounter for closed fracture with routine healing: Secondary | ICD-10-CM | POA: Diagnosis not present

## 2019-08-20 DIAGNOSIS — F329 Major depressive disorder, single episode, unspecified: Secondary | ICD-10-CM | POA: Diagnosis not present

## 2019-08-20 DIAGNOSIS — R32 Unspecified urinary incontinence: Secondary | ICD-10-CM | POA: Diagnosis not present

## 2019-08-20 DIAGNOSIS — R296 Repeated falls: Secondary | ICD-10-CM | POA: Diagnosis not present

## 2019-08-20 DIAGNOSIS — S72141D Displaced intertrochanteric fracture of right femur, subsequent encounter for closed fracture with routine healing: Secondary | ICD-10-CM | POA: Diagnosis not present

## 2019-08-20 DIAGNOSIS — E559 Vitamin D deficiency, unspecified: Secondary | ICD-10-CM | POA: Diagnosis not present

## 2019-08-20 DIAGNOSIS — K219 Gastro-esophageal reflux disease without esophagitis: Secondary | ICD-10-CM | POA: Diagnosis not present

## 2019-08-24 DIAGNOSIS — K219 Gastro-esophageal reflux disease without esophagitis: Secondary | ICD-10-CM | POA: Diagnosis not present

## 2019-08-24 DIAGNOSIS — S72141D Displaced intertrochanteric fracture of right femur, subsequent encounter for closed fracture with routine healing: Secondary | ICD-10-CM | POA: Diagnosis not present

## 2019-08-24 DIAGNOSIS — R296 Repeated falls: Secondary | ICD-10-CM | POA: Diagnosis not present

## 2019-08-24 DIAGNOSIS — R32 Unspecified urinary incontinence: Secondary | ICD-10-CM | POA: Diagnosis not present

## 2019-08-24 DIAGNOSIS — E559 Vitamin D deficiency, unspecified: Secondary | ICD-10-CM | POA: Diagnosis not present

## 2019-08-24 DIAGNOSIS — F329 Major depressive disorder, single episode, unspecified: Secondary | ICD-10-CM | POA: Diagnosis not present

## 2019-08-27 DIAGNOSIS — F329 Major depressive disorder, single episode, unspecified: Secondary | ICD-10-CM | POA: Diagnosis not present

## 2019-08-27 DIAGNOSIS — R296 Repeated falls: Secondary | ICD-10-CM | POA: Diagnosis not present

## 2019-08-27 DIAGNOSIS — R32 Unspecified urinary incontinence: Secondary | ICD-10-CM | POA: Diagnosis not present

## 2019-08-27 DIAGNOSIS — S72141D Displaced intertrochanteric fracture of right femur, subsequent encounter for closed fracture with routine healing: Secondary | ICD-10-CM | POA: Diagnosis not present

## 2019-08-27 DIAGNOSIS — K219 Gastro-esophageal reflux disease without esophagitis: Secondary | ICD-10-CM | POA: Diagnosis not present

## 2019-08-27 DIAGNOSIS — E559 Vitamin D deficiency, unspecified: Secondary | ICD-10-CM | POA: Diagnosis not present

## 2019-08-29 DIAGNOSIS — F329 Major depressive disorder, single episode, unspecified: Secondary | ICD-10-CM | POA: Diagnosis not present

## 2019-08-29 DIAGNOSIS — S72141D Displaced intertrochanteric fracture of right femur, subsequent encounter for closed fracture with routine healing: Secondary | ICD-10-CM | POA: Diagnosis not present

## 2019-08-29 DIAGNOSIS — R32 Unspecified urinary incontinence: Secondary | ICD-10-CM | POA: Diagnosis not present

## 2019-08-29 DIAGNOSIS — K219 Gastro-esophageal reflux disease without esophagitis: Secondary | ICD-10-CM | POA: Diagnosis not present

## 2019-08-29 DIAGNOSIS — E559 Vitamin D deficiency, unspecified: Secondary | ICD-10-CM | POA: Diagnosis not present

## 2019-08-29 DIAGNOSIS — R296 Repeated falls: Secondary | ICD-10-CM | POA: Diagnosis not present

## 2019-09-12 DIAGNOSIS — S72141D Displaced intertrochanteric fracture of right femur, subsequent encounter for closed fracture with routine healing: Secondary | ICD-10-CM | POA: Diagnosis not present

## 2019-09-12 DIAGNOSIS — S72001D Fracture of unspecified part of neck of right femur, subsequent encounter for closed fracture with routine healing: Secondary | ICD-10-CM | POA: Diagnosis not present

## 2019-09-13 ENCOUNTER — Other Ambulatory Visit: Payer: Self-pay | Admitting: Orthopedic Surgery

## 2019-09-13 DIAGNOSIS — E2839 Other primary ovarian failure: Secondary | ICD-10-CM

## 2019-10-10 ENCOUNTER — Other Ambulatory Visit: Payer: Self-pay | Admitting: Family Medicine

## 2019-10-10 DIAGNOSIS — Z1231 Encounter for screening mammogram for malignant neoplasm of breast: Secondary | ICD-10-CM

## 2019-10-12 ENCOUNTER — Telehealth: Payer: Self-pay | Admitting: Family Medicine

## 2019-10-12 NOTE — Telephone Encounter (Signed)
I recommend she stop the Effexor and go back on Lexapro (to take along with the Wellbutrin). She needs to taper off Effexor slowly however. Take one pill every OTHER day for 10 days and then stop it. Once she is off this start on Lexapro 10 mg daily. Call in #30 with 2 rf

## 2019-10-12 NOTE — Telephone Encounter (Signed)
Pt stated since she broke her hip she doesn't feel stable when she's walking or leaning over to get something. She stated some of the side effects of venlafaxine XR (EFFEXOR XR) 75 MG 24 hr capsule are dizzyness and dry mouth which she has been experiencing. She spoke with her pharmacy who told her to reach out to PCP to be put on alternative medication. Please advise.

## 2019-10-12 NOTE — Telephone Encounter (Signed)
Patient notified of update  and verbalized understanding. 

## 2019-10-12 NOTE — Telephone Encounter (Signed)
Please advise 

## 2019-10-29 NOTE — Telephone Encounter (Signed)
Routing to PCP

## 2019-10-29 NOTE — Telephone Encounter (Signed)
That would be okay to try. Call in Paxil 10 mg daily, #30 with 2 rf

## 2019-10-29 NOTE — Telephone Encounter (Signed)
Rx called in to preferred pharmacy. Called patient and notified her.

## 2019-10-29 NOTE — Telephone Encounter (Signed)
Pt has finished the efexxor and is now needing something else called in.  She is asking to see if paxil would work for her.  Please call [phone]

## 2019-10-31 ENCOUNTER — Encounter: Payer: Self-pay | Admitting: Family Medicine

## 2019-10-31 ENCOUNTER — Other Ambulatory Visit: Payer: Self-pay

## 2019-10-31 ENCOUNTER — Telehealth (INDEPENDENT_AMBULATORY_CARE_PROVIDER_SITE_OTHER): Payer: Medicare Other | Admitting: Family Medicine

## 2019-10-31 ENCOUNTER — Other Ambulatory Visit: Payer: Self-pay | Admitting: Family Medicine

## 2019-10-31 DIAGNOSIS — R300 Dysuria: Secondary | ICD-10-CM

## 2019-10-31 DIAGNOSIS — N760 Acute vaginitis: Secondary | ICD-10-CM | POA: Diagnosis not present

## 2019-10-31 MED ORDER — TERCONAZOLE 0.4 % VA CREA: 1.0000 | TOPICAL_CREAM | Freq: Every day | VAGINAL | 0 refills | Status: AC

## 2019-10-31 MED ORDER — FLUCONAZOLE 150 MG PO TABS: 150.0000 mg | ORAL_TABLET | ORAL | 0 refills | Status: AC

## 2019-10-31 MED ORDER — SULFAMETHOXAZOLE-TRIMETHOPRIM 800-160 MG PO TABS: 1.0000 | ORAL_TABLET | Freq: Two times a day (BID) | ORAL | 0 refills | Status: AC

## 2019-10-31 NOTE — Progress Notes (Signed)
Virtual Visit via Video Note   I connected with Emily Horton on 10/31/19 by a video enabled telemedicine application and verified that I am speaking with the correct person using two identifiers.  Location patient: home Location provider:work office Persons participating in the virtual visit: patient, provider  I discussed the limitations of evaluation and management by telemedicine and the availability of in person appointments. The patient expressed understanding and agreed to proceed.  Chief Complaint  Patient presents with  . Dysuria    itchy,odor and buring during urination///ltd    HPI: Emily Horton is a 73 yo female with hx of depression, IBS, and GERD c/o a few days of urinary symptoms. Burning sensation with urination, odorous urine, and vaginal pruritus. She is also reporting increasing urine frequency, urgency, and urine incontinence.  She has not identified exacerbating or alleviating factors.  She has history of urine incontinence but it seems to be worse.  She denies having fever, chills, night sweats, abdominal pain, nausea, vomiting, changes in bowel habits, unusual back pain, or vaginal bleeding. She has noted whitish vaginal discharge.  She has tried OTC medications for itching.  Problem seems to be stable. Postmenopausal. She is not sexually active.  She has no hx of UTI.  ROS: See pertinent positives and negatives per HPI.  Past Medical History:  Diagnosis Date  . Colon polyps 09-25-2007   Colonoscopy  . Depression   . Diverticulosis of colon (without mention of hemorrhage) 09-25-2007   Colonoscopy  . GERD (gastroesophageal reflux disease)   . Gynecological examination    sees Dr. Carren Rang   . Hyperlipidemia     Past Surgical History:  Procedure Laterality Date  . COLONOSCOPY  12/30/2015   per Dr. Henrene Pastor, benign  polyps, repeat in 5  yrs.   . ESOPHAGOGASTRODUODENOSCOPY  06-30-12   per Dr. Sharlett Iles, reflux but no Barretts seen   . INTRAMEDULLARY (IM)  NAIL INTERTROCHANTERIC Right 06/22/2019  . INTRAMEDULLARY (IM) NAIL INTERTROCHANTERIC Right 06/22/2019   Procedure: INTRAMEDULLARY (IM) NAIL INTERTROCHANTRIC;  Surgeon: Altamese Arcola, MD;  Location: [facility];  Service: Orthopedics;  Laterality: Right;    Family History  Problem Relation Age of Onset  . Breast cancer Maternal Grandmother   . Crohn's disease Father   . Colon polyps Father   . Stroke Paternal Grandfather     Social History   Socioeconomic History  . Marital status: Married    Spouse name: Not on file  . Number of children: 2  . Years of education: Not on file  . Highest education level: Not on file  Occupational History  . Occupation: retired Tour manager  . Financial resource strain: Not on file  . Food insecurity    Worry: Not on file    Inability: Not on file  . Transportation needs    Medical: Not on file    Non-medical: Not on file  Tobacco Use  . Smoking status: Current Every Day Smoker    Packs/day: 0.50    Years: 30.00    Pack years: 15.00    Types: Cigarettes  . Smokeless tobacco: Never Used  . Tobacco comment: or less  Substance and Sexual Activity  . Alcohol use: Yes    Alcohol/week: 0.0 standard drinks    Comment: occasional  . Drug use: No  . Sexual activity: Not on file  Lifestyle  . Physical activity    Days per week: Not on file    Minutes per session: Not on file  .  Stress: Not on file  Relationships  . Social Herbalist on phone: Not on file    Gets together: Not on file    Attends religious service: Not on file    Active member of club or organization: Not on file    Attends meetings of clubs or organizations: Not on file    Relationship status: Not on file  . Intimate partner violence    Fear of current or ex partner: Not on file    Emotionally abused: Not on file    Physically abused: Not on file    Forced sexual activity: Not on file  Other Topics Concern  . Not on file  Social History Narrative  .  Not on file    Current Outpatient Medications:  .  acetaminophen (TYLENOL) 325 MG tablet, Take 1-2 tablets (325-650 mg total) by mouth every 6 (six) hours as needed for mild pain or moderate pain (pain score 1-3 or temp > 100.5)., Disp: 50 tablet, Rfl: 0 .  aspirin-acetaminophen-caffeine (EXCEDRIN MIGRAINE) 250-250-65 MG tablet, Take 1 tablet by mouth every 6 (six) hours as needed for headache., Disp: , Rfl:  .  buPROPion (WELLBUTRIN XL) 300 MG 24 hr tablet, TAKE 1 TABLET BY MOUTH EVERY MORNING, Disp: 90 tablet, Rfl: 3 .  cetirizine (ZYRTEC) 10 MG tablet, TAKE 1 TABLET(10 MG) BY MOUTH DAILY (Patient taking differently: Take 10 mg by mouth every morning. ), Disp: 90 tablet, Rfl: 3 .  enoxaparin (LOVENOX) 40 MG/0.4ML injection, Inject 0.4 mLs (40 mg total) into the skin daily for 28 days., Disp: 11.2 mL, Rfl: 0 .  EPINEPHrine 0.3 mg/0.3 mL IJ SOAJ injection, Inject 0.3 mLs into the muscle daily as needed for anaphylaxis. , Disp: , Rfl:  .  fluconazole (DIFLUCAN) 150 MG tablet, Take 1 tablet (150 mg total) by mouth once a week for 2 doses., Disp: 2 tablet, Rfl: 0 .  HYDROcodone-acetaminophen (NORCO/VICODIN) 5-325 MG tablet, Take 1-2 tablets by mouth every 6 (six) hours as needed for moderate pain or severe pain., Disp: 50 tablet, Rfl: 0 .  hyoscyamine (LEVSIN SL) 0.125 MG SL tablet, Place 1 tablet (0.125 mg total) under the tongue every 4 (four) hours as needed. (Patient taking differently: Place 0.125 mg under the tongue every 4 (four) hours as needed for cramping. ), Disp: 60 tablet, Rfl: 11 .  omeprazole (PRILOSEC) 40 MG capsule, TAKE 1 CAPSULE BY MOUTH TWICE DAILY, Disp: 180 capsule, Rfl: 3 .  polyethylene glycol (MIRALAX / GLYCOLAX) 17 g packet, Take 17 g by mouth daily as needed for moderate constipation or severe constipation., Disp: 14 each, Rfl: 0 .  senna-docusate (SENOKOT-S) 8.6-50 MG tablet, Take 1 tablet by mouth 2 (two) times daily., Disp: 20 tablet, Rfl: 0 .  sulfamethoxazole-trimethoprim  (BACTRIM DS) 800-160 MG tablet, Take 1 tablet by mouth 2 (two) times daily for 3 days., Disp: 6 tablet, Rfl: 0 .  terconazole (TERAZOL 7) 0.4 % vaginal cream, Place 1 applicator vaginally at bedtime for 7 days., Disp: 45 g, Rfl: 0 .  triamcinolone cream (KENALOG) 0.1 %, Apply 1 application topically 2 (two) times daily., Disp: 453.6 g, Rfl: 3  EXAM:  VITALS per patient if applicable:N/A  GENERAL: alert, oriented, appears well and in no acute distress  HEENT: atraumatic, conjunctiva clear, no obvious abnormalities on inspection.  NECK: normal movements of the head and neck  LUNGS: on inspection no signs of respiratory distress, breathing rate appears normal, no obvious gross SOB, gasping or wheezing  CV: no obvious cyanosis  Emily: moves all visible extremities without noticeable abnormality  PSYCH/NEURO: pleasant and cooperative, no obvious depression, +anxious.Speech and thought processing grossly intact  ASSESSMENT AND PLAN:  Discussed the following assessment and plan:  Dysuria We discussed possible etiologies, including non infectious. I am not certain this is caused by UTI. Rest of urinary symptoms can be related to overactive bladder.  At this time UA and Ucx cannot be collected.lab is closing in a few minutes,pt can not be here right now to collect sample and office will be closed until Monday. So I sent Rx for Bactrim DS to pick up is urinary symptoms are not improved in a few days or it they get worse. We discussed side effects of abx's. Adequate hydration. Instructed about warning signs.  Vulvovaginitis Problem can explained some of her complaints today, including dysuria. Empiric treatment with Diflucan 150 mg weekly x2 and Terazosin vaginal cream recommended. Instructed about warning signs. Follow-up with PCP if symptoms are persistent.    I discussed the assessment and treatment plan with the patient. She was provided an opportunity to ask questions and all were  answered. She agreed with the plan and demonstrated an understanding of the instructions.   The patient was advised to call back or seek an in-person evaluation if the symptoms worsen or if the condition fails to improve as anticipated.  Return if symptoms worsen or fail to improve.    Betty Martinique, MD

## 2019-11-14 ENCOUNTER — Other Ambulatory Visit: Payer: Self-pay | Admitting: Orthopedic Surgery

## 2019-11-14 DIAGNOSIS — S72141D Displaced intertrochanteric fracture of right femur, subsequent encounter for closed fracture with routine healing: Secondary | ICD-10-CM | POA: Diagnosis not present

## 2019-11-14 DIAGNOSIS — S72001D Fracture of unspecified part of neck of right femur, subsequent encounter for closed fracture with routine healing: Secondary | ICD-10-CM | POA: Diagnosis not present

## 2019-11-23 ENCOUNTER — Other Ambulatory Visit: Payer: Self-pay

## 2019-11-23 ENCOUNTER — Ambulatory Visit
Admission: RE | Admit: 2019-11-23 | Discharge: 2019-11-23 | Disposition: A | Discharge status: Home / Self Care | Payer: Medicare Other | Source: Ambulatory Visit | Attending: Orthopedic Surgery | Admitting: Orthopedic Surgery

## 2019-11-23 DIAGNOSIS — S72141D Displaced intertrochanteric fracture of right femur, subsequent encounter for closed fracture with routine healing: Secondary | ICD-10-CM

## 2019-12-03 ENCOUNTER — Other Ambulatory Visit: Payer: Self-pay

## 2019-12-03 ENCOUNTER — Ambulatory Visit
Admission: RE | Admit: 2019-12-03 | Discharge: 2019-12-03 | Disposition: A | Discharge status: Home / Self Care | Payer: Medicare Other | Source: Ambulatory Visit | Attending: Orthopedic Surgery | Admitting: Orthopedic Surgery

## 2019-12-03 ENCOUNTER — Ambulatory Visit
Admission: RE | Admit: 2019-12-03 | Discharge: 2019-12-03 | Disposition: A | Discharge status: Home / Self Care | Payer: Medicare Other | Source: Ambulatory Visit | Attending: Family Medicine | Admitting: Family Medicine

## 2019-12-03 DIAGNOSIS — Z1231 Encounter for screening mammogram for malignant neoplasm of breast: Secondary | ICD-10-CM

## 2019-12-03 DIAGNOSIS — E2839 Other primary ovarian failure: Secondary | ICD-10-CM

## 2020-01-08 ENCOUNTER — Telehealth: Payer: Self-pay | Admitting: Family Medicine

## 2020-01-08 NOTE — Telephone Encounter (Signed)
Yes please set this up

## 2020-01-08 NOTE — Telephone Encounter (Signed)
Okay to order Prolia?

## 2020-01-08 NOTE — Telephone Encounter (Signed)
Dr. Marcelino Scot is requesting patient get set up with Prolia shots due to having the start of Osteoporosis.  Please give the patient a call back.

## 2020-01-23 ENCOUNTER — Other Ambulatory Visit: Payer: Self-pay | Admitting: Family Medicine

## 2020-01-23 NOTE — Telephone Encounter (Signed)
Insurance verification sent to [employer]

## 2020-01-27 ENCOUNTER — Emergency Department (HOSPITAL_COMMUNITY): Payer: Medicare Other

## 2020-01-27 ENCOUNTER — Emergency Department (HOSPITAL_COMMUNITY)
Admission: EM | Admit: 2020-01-27 | Discharge: 2020-01-27 | Disposition: A | Discharge status: Home / Self Care | Payer: Medicare Other | Source: Home / Self Care | Attending: Emergency Medicine | Admitting: Emergency Medicine

## 2020-01-27 ENCOUNTER — Encounter (HOSPITAL_COMMUNITY): Payer: Self-pay | Admitting: Emergency Medicine

## 2020-01-27 ENCOUNTER — Other Ambulatory Visit: Payer: Self-pay

## 2020-01-27 DIAGNOSIS — W01198A Fall on same level from slipping, tripping and stumbling with subsequent striking against other object, initial encounter: Secondary | ICD-10-CM | POA: Insufficient documentation

## 2020-01-27 DIAGNOSIS — Y999 Unspecified external cause status: Secondary | ICD-10-CM | POA: Insufficient documentation

## 2020-01-27 DIAGNOSIS — Y9301 Activity, walking, marching and hiking: Secondary | ICD-10-CM | POA: Insufficient documentation

## 2020-01-27 DIAGNOSIS — F17210 Nicotine dependence, cigarettes, uncomplicated: Secondary | ICD-10-CM | POA: Insufficient documentation

## 2020-01-27 DIAGNOSIS — D62 Acute posthemorrhagic anemia: Secondary | ICD-10-CM | POA: Diagnosis not present

## 2020-01-27 DIAGNOSIS — S42291A Other displaced fracture of upper end of right humerus, initial encounter for closed fracture: Secondary | ICD-10-CM | POA: Insufficient documentation

## 2020-01-27 DIAGNOSIS — W19XXXA Unspecified fall, initial encounter: Secondary | ICD-10-CM | POA: Diagnosis not present

## 2020-01-27 DIAGNOSIS — R52 Pain, unspecified: Secondary | ICD-10-CM | POA: Diagnosis not present

## 2020-01-27 DIAGNOSIS — S42351A Displaced comminuted fracture of shaft of humerus, right arm, initial encounter for closed fracture: Secondary | ICD-10-CM | POA: Diagnosis not present

## 2020-01-27 DIAGNOSIS — E785 Hyperlipidemia, unspecified: Secondary | ICD-10-CM | POA: Diagnosis not present

## 2020-01-27 DIAGNOSIS — Y92007 Garden or yard of unspecified non-institutional (private) residence as the place of occurrence of the external cause: Secondary | ICD-10-CM | POA: Insufficient documentation

## 2020-01-27 DIAGNOSIS — S72001K Fracture of unspecified part of neck of right femur, subsequent encounter for closed fracture with nonunion: Secondary | ICD-10-CM | POA: Diagnosis not present

## 2020-01-27 DIAGNOSIS — S42201A Unspecified fracture of upper end of right humerus, initial encounter for closed fracture: Secondary | ICD-10-CM | POA: Diagnosis not present

## 2020-01-27 DIAGNOSIS — S4991XA Unspecified injury of right shoulder and upper arm, initial encounter: Secondary | ICD-10-CM | POA: Diagnosis not present

## 2020-01-27 DIAGNOSIS — Z20822 Contact with and (suspected) exposure to covid-19: Secondary | ICD-10-CM | POA: Diagnosis not present

## 2020-01-27 DIAGNOSIS — K219 Gastro-esophageal reflux disease without esophagitis: Secondary | ICD-10-CM | POA: Diagnosis not present

## 2020-01-27 MED ORDER — OXYCODONE HCL 5 MG PO TABS
5.0000 mg | ORAL_TABLET | Freq: Once | ORAL | Status: AC
  Administered 2020-01-27: 5 mg via ORAL
  Filled 2020-01-27: qty 1

## 2020-01-27 MED ORDER — OXYCODONE HCL 5 MG PO TABS: 5.0000 mg | ORAL_TABLET | ORAL | 0 refills | Status: DC | PRN

## 2020-01-27 NOTE — ED Triage Notes (Signed)
74 yo female BIB [facility] from home. Fell while pushi g garbage can in drive way fell On right shoulder. 10/10 pain in right shoulder, crepitus upon palpation per EMS. PMS intact per EMS, pt unable to move extremity without pain.   Vitals: bp 150/80 Hr 80 rr 20 spo2 96 % room air   20 G r hand 150 mcg total of fentanyl per EMS

## 2020-01-27 NOTE — Discharge Instructions (Addendum)
You were evaluated in the Emergency Department and after careful evaluation, we did not find any emergent condition requiring admission or further testing in the hospital.  You broke your humerus bone.  Please follow-up with an orthopedic specialist.  Please return to the Emergency Department if you experience any worsening of your condition.  We encourage you to follow up with a primary care provider.  Thank you for allowing [place] to be a part of your care.

## 2020-01-27 NOTE — ED Provider Notes (Signed)
[facility] Emergency Department Provider Note MRN:  [id number]  Arrival date & time: 01/27/20     Chief Complaint   Fall and Shoulder Pain   History of Present Illness   Emily Horton is a 74 y.o. year-old female with a history of hyperlipidemia presenting to the ED with chief complaint of shoulder pain.  Patient was walking her garbage out to the end of her yard, was walking down her sloped driveway when she lost her balance and fell onto her right shoulder.  Endorsing popping and crunching sound to the right shoulder with severe pain that is worse with motion.  Denies head trauma or loss of consciousness, no nausea or vomiting, no neck pain, no back pain, no chest pain, shortness of breath, no abdominal pain.  No injuries to the legs or hips.  Review of Systems  A complete 10 system review of systems was obtained and all systems are negative except as noted in the HPI and PMH.   Patient's Health History    Past Medical History:  Diagnosis Date  . Colon polyps 09-25-2007   Colonoscopy  . Depression   . Diverticulosis of colon (without mention of hemorrhage) 09-25-2007   Colonoscopy  . GERD (gastroesophageal reflux disease)   . Gynecological examination    sees Dr. Carren Rang   . Hyperlipidemia     Past Surgical History:  Procedure Laterality Date  . COLONOSCOPY  12/30/2015   per Dr. Henrene Pastor, benign  polyps, repeat in 5  yrs.   . ESOPHAGOGASTRODUODENOSCOPY  06-30-12   per Dr. Sharlett Iles, reflux but no Barretts seen   . INTRAMEDULLARY (IM) NAIL INTERTROCHANTERIC Right 06/22/2019  . INTRAMEDULLARY (IM) NAIL INTERTROCHANTERIC Right 06/22/2019   Procedure: INTRAMEDULLARY (IM) NAIL INTERTROCHANTRIC;  Surgeon: Altamese Red Dog Mine, MD;  Location: [place];  Service: Orthopedics;  Laterality: Right;    Family History  Problem Relation Age of Onset  . Breast cancer Maternal Grandmother   . Crohn's disease Father   . Colon polyps Father   . Stroke Paternal Grandfather       Social History   Socioeconomic History  . Marital status: Married    Spouse name: Not on file  . Number of children: 2  . Years of education: Not on file  . Highest education level: Not on file  Occupational History  . Occupation: retired Pharmacist, hospital  Tobacco Use  . Smoking status: Current Every Day Smoker    Packs/day: 0.50    Years: 30.00    Pack years: 15.00    Types: Cigarettes  . Smokeless tobacco: Never Used  . Tobacco comment: or less  Substance and Sexual Activity  . Alcohol use: Yes    Alcohol/week: 0.0 standard drinks    Comment: occasional  . Drug use: No  . Sexual activity: Not on file  Other Topics Concern  . Not on file  Social History Narrative  . Not on file   Social Determinants of Health   Financial Resource Strain:   . Difficulty of Paying Living Expenses: Not on file  Food Insecurity:   . Worried About Charity fundraiser in the Last Year: Not on file  . Ran Out of Food in the Last Year: Not on file  Transportation Needs:   . Lack of Transportation (Medical): Not on file  . Lack of Transportation (Non-Medical): Not on file  Physical Activity:   . Days of Exercise per Week: Not on file  . Minutes of Exercise per Session: Not on  file  Stress:   . Feeling of Stress : Not on file  Social Connections:   . Frequency of Communication with Friends and Family: Not on file  . Frequency of Social Gatherings with Friends and Family: Not on file  . Attends Religious Services: Not on file  . Active Member of Clubs or Organizations: Not on file  . Attends Archivist Meetings: Not on file  . Marital Status: Not on file  Intimate Partner Violence:   . Fear of Current or Ex-Partner: Not on file  . Emotionally Abused: Not on file  . Physically Abused: Not on file  . Sexually Abused: Not on file     Physical Exam   Vitals:   01/27/20 1910 01/27/20 2205  BP: 127/71 137/78  Pulse: 87 (!) 105  Resp: 18 18  Temp: 98.4 F (36.9 C)   SpO2: 97% 97%     CONSTITUTIONAL: Well-appearing, NAD NEURO:  Alert and oriented x 3, no focal deficits EYES:  eyes equal and reactive ENT/NECK:  no LAD, no JVD CARDIO: Regular rate, well-perfused, normal S1 and S2 PULM:  CTAB no wheezing or rhonchi GI/GU:  normal bowel sounds, non-distended, non-tender MSK/SPINE:  No gross deformities, no edema; tenderness palpation to the right shoulder, right elbow SKIN:  no rash, atraumatic PSYCH:  Appropriate speech and behavior  *Additional and/or pertinent findings included in MDM below  Diagnostic and Interventional Summary    EKG Interpretation  Date/Time:    Ventricular Rate:    PR Interval:    QRS Duration:   QT Interval:    QTC Calculation:   R Axis:     Text Interpretation:        Cardiac Monitoring Interpretation:  Labs Reviewed - No data to display  DG Shoulder Right  Final Result    DG Humerus Right  Final Result    DG Elbow Complete Right  Final Result      Medications  oxyCODONE (Oxy IR/ROXICODONE) immediate release tablet 5 mg (5 mg Oral Given 01/27/20 2028)  oxyCODONE (Oxy IR/ROXICODONE) immediate release tablet 5 mg (5 mg Oral Given 01/27/20 2204)     Procedures  /  Critical Care Procedures  ED Course and Medical Decision Making  I have reviewed the triage vital signs, the nursing notes, and pertinent available records from the EMR.  Pertinent labs & imaging results that were available during my care of the patient were reviewed by me and considered in my medical decision making (see below for details).     Mechanical fall with suspected clavicle or proximal humerus fracture, x-rays pending.  X-ray reveals proximal humerus fracture, limb is neurovascularly intact, appropriate for sling and Ortho follow-up.  Discussed case with Dr. Mardelle Matte who will be happy to see patient in the office.  Barth Kirks. Sedonia Small, MD [place] [email or web address]  Final Clinical Impressions(s)  / ED Diagnoses     ICD-10-CM   1. Other closed displaced fracture of proximal end of right humerus, initial encounter  S42.291A     ED Discharge Orders         Ordered    oxyCODONE (ROXICODONE) 5 MG immediate release tablet  Every 4 hours PRN     01/27/20 2118           Discharge Instructions Discussed with and Provided to Patient:     Discharge Instructions     You were evaluated in the Emergency Department and after careful evaluation, we did  not find any emergent condition requiring admission or further testing in the hospital.  You broke your humerus bone.  Please follow-up with an orthopedic specialist.  Please return to the Emergency Department if you experience any worsening of your condition.  We encourage you to follow up with a primary care provider.  Thank you for allowing [place] to be a part of your care.       Maudie Flakes, MD 01/27/20 [id number]

## 2020-01-28 ENCOUNTER — Other Ambulatory Visit: Payer: Self-pay | Admitting: Orthopedic Surgery

## 2020-01-28 ENCOUNTER — Encounter (HOSPITAL_COMMUNITY)
Admission: RE | Admit: 2020-01-28 | Discharge: 2020-01-28 | Disposition: A | Discharge status: Home / Self Care | Payer: Medicare Other | Source: Ambulatory Visit | Attending: Orthopedic Surgery | Admitting: Orthopedic Surgery

## 2020-01-28 ENCOUNTER — Other Ambulatory Visit (HOSPITAL_COMMUNITY)
Admission: RE | Admit: 2020-01-28 | Discharge: 2020-01-28 | Disposition: A | Discharge status: Home / Self Care | Payer: Medicare Other | Source: Ambulatory Visit | Attending: Orthopedic Surgery | Admitting: Orthopedic Surgery

## 2020-01-28 ENCOUNTER — Ambulatory Visit
Admission: RE | Admit: 2020-01-28 | Discharge: 2020-01-28 | Disposition: A | Discharge status: Home / Self Care | Payer: Medicare Other | Source: Ambulatory Visit | Attending: Orthopedic Surgery | Admitting: Orthopedic Surgery

## 2020-01-28 DIAGNOSIS — S42291A Other displaced fracture of upper end of right humerus, initial encounter for closed fracture: Secondary | ICD-10-CM | POA: Diagnosis not present

## 2020-01-28 DIAGNOSIS — Z20822 Contact with and (suspected) exposure to covid-19: Secondary | ICD-10-CM | POA: Insufficient documentation

## 2020-01-28 DIAGNOSIS — M25511 Pain in right shoulder: Secondary | ICD-10-CM

## 2020-01-28 DIAGNOSIS — Z01812 Encounter for preprocedural laboratory examination: Secondary | ICD-10-CM | POA: Insufficient documentation

## 2020-01-28 LAB — CBC
HCT: 40 % (ref 36.0–46.0)
Hemoglobin: 13 g/dL (ref 12.0–15.0)
MCH: 30.5 pg (ref 26.0–34.0)
MCHC: 32.5 g/dL (ref 30.0–36.0)
MCV: 93.9 fL (ref 80.0–100.0)
Platelets: 260 10*3/uL (ref 150–400)
RBC: 4.26 MIL/uL (ref 3.87–5.11)
RDW: 13.1 % (ref 11.5–15.5)
WBC: 12.3 10*3/uL — ABNORMAL HIGH (ref 4.0–10.5)
nRBC: 0 % (ref 0.0–0.2)

## 2020-01-28 LAB — BASIC METABOLIC PANEL
Anion gap: 9 (ref 5–15)
BUN: 25 mg/dL — ABNORMAL HIGH (ref 8–23)
CO2: 22 mmol/L (ref 22–32)
Calcium: 9.6 mg/dL (ref 8.9–10.3)
Chloride: 107 mmol/L (ref 98–111)
Creatinine, Ser: 0.84 mg/dL (ref 0.44–1.00)
GFR calc Af Amer: >60 mL/min (ref >60)
GFR calc non Af Amer: >60 mL/min (ref >60)
Glucose, Bld: 131 mg/dL — ABNORMAL HIGH (ref 70–99)
Potassium: 4.2 mmol/L (ref 3.5–5.1)
Sodium: 138 mmol/L (ref 135–145)

## 2020-01-28 LAB — SURGICAL PCR SCREEN
MRSA, PCR: NEGATIVE
Staphylococcus aureus: NEGATIVE

## 2020-01-28 LAB — SARS CORONAVIRUS 2 (TAT 6-24 HRS): SARS Coronavirus 2: NEGATIVE

## 2020-01-28 NOTE — Patient Instructions (Addendum)
DUE TO COVID-19 ONLY ONE VISITOR IS ALLOWED TO COME WITH YOU AND STAY IN THE WAITING ROOM ONLY DURING PRE OP AND PROCEDURE DAY OF SURGERY. THE 1 VISITOR MAY VISIT WITH YOU AFTER SURGERY IN YOUR PRIVATE ROOM DURING VISITING HOURS ONLY!  YOU NEED TO HAVE A COVID 19 TEST ON 01-28-20 @ 2:00PM_______, THIS TEST MUST BE DONE BEFORE SURGERY, COME  [place], [place] [place] , [address].  ([place]) ONCE YOUR COVID TEST IS COMPLETED, PLEASE BEGIN THE QUARANTINE INSTRUCTIONS AS OUTLINED IN YOUR HANDOUT.                Emily Horton  01/28/2020   Your procedure is scheduled on: 01-28-19   Report to [facility] Main  Entrance    Report to Admitting at  11:00 AM     Call this number if you have problems the morning of surgery [phone]    Remember: Do not eat food or drink liquids :After Midnight.     Take these medicines the morning of surgery with A SIP OF WATER: Cetirizine, Bupropion, Omeprazole, and Pain medication  BRUSH YOUR TEETH MORNING OF SURGERY AND RINSE YOUR MOUTH OUT, NO CHEWING GUM CANDY OR MINTS.                                 You may not have any metal on your body including hair pins and              piercings    Do not wear jewelry, make-up, lotions, powders or perfumes, deodorant             Do not wear nail polish on your fingernails.  Do not shave  48 hours prior to surgery.               Do not bring valuables to the hospital. [place].  Contacts, dentures or bridgework may not be worn into surgery.  Leave suitcase in the car. After surgery it may be brought to your room.     Patients discharged the day of surgery will not be allowed to drive home. IF YOU ARE HAVING SURGERY AND GOING HOME THE SAME DAY, YOU MUST HAVE AN ADULT TO DRIVE YOU HOME AND BE WITH YOU FOR 24 HOURS. YOU MAY GO HOME BY TAXI OR UBER OR ORTHERWISE, BUT AN ADULT MUST ACCOMPANY YOU HOME AND STAY WITH YOU FOR 24 HOURS.  Name  and phone number of your driver:  Special Instructions: N/A              Please read over the following fact sheets you were given: _____________________________________________________________________             [facility] - Preparing for Surgery Before surgery, you can play an important role.  Because skin is not sterile, your skin needs to be as free of germs as possible.  You can reduce the number of germs on your skin by washing with CHG (chlorahexidine gluconate) soap before surgery.  CHG is an antiseptic cleaner which kills germs and bonds with the skin to continue killing germs even after washing. Please DO NOT use if you have an allergy to CHG or antibacterial soaps.  If your skin becomes reddened/irritated stop using the CHG and inform your nurse when you  arrive at Short Stay. Do not shave (including legs and underarms) for at least 48 hours prior to the first CHG shower.  You may shave your face/neck. Please follow these instructions carefully:  1.  Shower with CHG Soap the night before surgery and the  morning of Surgery.  2.  If you choose to wash your hair, wash your hair first as usual with your  normal  shampoo.  3.  After you shampoo, rinse your hair and body thoroughly to remove the  shampoo.                           4.  Use CHG as you would any other liquid soap.  You can apply chg directly  to the skin and wash                       Gently with a scrungie or clean washcloth.  5.  Apply the CHG Soap to your body ONLY FROM THE NECK DOWN.   Do not use on face/ open                           Wound or open sores. Avoid contact with eyes, ears mouth and genitals (private parts).                       Wash face,  Genitals (private parts) with your normal soap.             6.  Wash thoroughly, paying special attention to the area where your surgery  will be performed.  7.  Thoroughly rinse your body with warm water from the neck down.  8.  DO NOT shower/wash with your normal  soap after using and rinsing off  the CHG Soap.                9.  Pat yourself dry with a clean towel.            10.  Wear clean pajamas.            11.  Place clean sheets on your bed the night of your first shower and do not  sleep with pets. Day of Surgery : Do not apply any lotions/deodorants the morning of surgery.  Please wear clean clothes to the hospital/surgery center.  FAILURE TO FOLLOW THESE INSTRUCTIONS MAY RESULT IN THE CANCELLATION OF YOUR SURGERY PATIENT SIGNATURE_________________________________  NURSE SIGNATURE__________________________________  ________________________________________________________________________  [facility]- Preparing for Total Shoulder Arthroplasty    Before surgery, you can play an important role. Because skin is not sterile, your skin needs to be as free of germs as possible. You can reduce the number of germs on your skin by using the following products. . Benzoyl Peroxide Gel o Reduces the number of germs present on the skin o Applied twice a day to shoulder area starting two days before surgery    ==================================================================  Please follow these instructions carefully:  BENZOYL PEROXIDE 5% GEL  Please do not use if you have an allergy to benzoyl peroxide.   If your skin becomes reddened/irritated stop using the benzoyl peroxide.  Starting two days before surgery, apply as follows: 1. Apply benzoyl peroxide in the morning and at night. Apply after taking a shower. If you are not taking a shower clean entire shoulder front, back, and side along with the armpit with  a clean wet washcloth.  2. Place a quarter-sized dollop on your shoulder and rub in thoroughly, making sure to cover the front, back, and side of your shoulder, along with the armpit.   2 days before ____ AM   ____ PM              1 day before ____ AM   ____ PM                         3. Do this twice a day for two days.  (Last  application is the night before surgery, AFTER using the CHG soap as described below).  4. Do NOT apply benzoyl peroxide gel on the day of surgery.

## 2020-01-28 NOTE — Progress Notes (Signed)
Attempted pre-op call. Voice message left.

## 2020-01-28 NOTE — Progress Notes (Addendum)
PCP - Alysia Penna Cardiologist -   Chest x-ray -  EKG - 06-17-19 Stress Test -  ECHO -  Cardiac Cath -   Sleep Study -  CPAP -   Fasting Blood Sugar -  Checks Blood Sugar _____ times a day  Blood Thinner Instructions: Aspirin Instructions: Last Dose:  Anesthesia review:   Patient denies shortness of breath, fever, cough and chest pain at PAT appointment   Patient verbalized understanding of instructions that were given to them at the PAT appointment. Patient was also instructed that they will need to review over the PAT instructions again at home before surgery.

## 2020-01-29 ENCOUNTER — Encounter (HOSPITAL_COMMUNITY): Payer: Self-pay | Admitting: Orthopedic Surgery

## 2020-01-29 ENCOUNTER — Other Ambulatory Visit: Payer: Self-pay

## 2020-01-29 ENCOUNTER — Encounter (HOSPITAL_COMMUNITY)
Admission: RE | Disposition: A | Discharge status: Home / Self Care | Payer: Self-pay | Source: Other Acute Inpatient Hospital | Attending: Orthopedic Surgery

## 2020-01-29 ENCOUNTER — Telehealth (HOSPITAL_COMMUNITY): Payer: Self-pay | Admitting: *Deleted

## 2020-01-29 ENCOUNTER — Inpatient Hospital Stay (HOSPITAL_COMMUNITY): Payer: Medicare Other

## 2020-01-29 ENCOUNTER — Ambulatory Visit (HOSPITAL_COMMUNITY): Payer: Medicare Other | Admitting: Physician Assistant

## 2020-01-29 ENCOUNTER — Inpatient Hospital Stay (HOSPITAL_COMMUNITY)
Admission: RE | Admit: 2020-01-29 | Discharge: 2020-02-06 | DRG: 470 | Disposition: A | Discharge status: Skilled Nursing Facility | Payer: Medicare Other | Source: Other Acute Inpatient Hospital | Attending: Orthopedic Surgery | Admitting: Orthopedic Surgery

## 2020-01-29 DIAGNOSIS — Z91012 Allergy to eggs: Secondary | ICD-10-CM

## 2020-01-29 DIAGNOSIS — Z888 Allergy status to other drugs, medicaments and biological substances status: Secondary | ICD-10-CM

## 2020-01-29 DIAGNOSIS — L089 Local infection of the skin and subcutaneous tissue, unspecified: Secondary | ICD-10-CM | POA: Diagnosis not present

## 2020-01-29 DIAGNOSIS — K219 Gastro-esophageal reflux disease without esophagitis: Secondary | ICD-10-CM | POA: Diagnosis not present

## 2020-01-29 DIAGNOSIS — F17210 Nicotine dependence, cigarettes, uncomplicated: Secondary | ICD-10-CM | POA: Diagnosis present

## 2020-01-29 DIAGNOSIS — M2550 Pain in unspecified joint: Secondary | ICD-10-CM | POA: Diagnosis not present

## 2020-01-29 DIAGNOSIS — Z471 Aftercare following joint replacement surgery: Secondary | ICD-10-CM | POA: Diagnosis not present

## 2020-01-29 DIAGNOSIS — S42201A Unspecified fracture of upper end of right humerus, initial encounter for closed fracture: Secondary | ICD-10-CM | POA: Diagnosis not present

## 2020-01-29 DIAGNOSIS — S42291A Other displaced fracture of upper end of right humerus, initial encounter for closed fracture: Secondary | ICD-10-CM | POA: Diagnosis not present

## 2020-01-29 DIAGNOSIS — R1312 Dysphagia, oropharyngeal phase: Secondary | ICD-10-CM | POA: Diagnosis not present

## 2020-01-29 DIAGNOSIS — Z20822 Contact with and (suspected) exposure to covid-19: Secondary | ICD-10-CM | POA: Diagnosis not present

## 2020-01-29 DIAGNOSIS — F329 Major depressive disorder, single episode, unspecified: Secondary | ICD-10-CM | POA: Diagnosis present

## 2020-01-29 DIAGNOSIS — R41841 Cognitive communication deficit: Secondary | ICD-10-CM | POA: Diagnosis not present

## 2020-01-29 DIAGNOSIS — W19XXXA Unspecified fall, initial encounter: Secondary | ICD-10-CM | POA: Diagnosis not present

## 2020-01-29 DIAGNOSIS — S42301A Unspecified fracture of shaft of humerus, right arm, initial encounter for closed fracture: Secondary | ICD-10-CM | POA: Diagnosis not present

## 2020-01-29 DIAGNOSIS — S72141D Displaced intertrochanteric fracture of right femur, subsequent encounter for closed fracture with routine healing: Secondary | ICD-10-CM | POA: Diagnosis not present

## 2020-01-29 DIAGNOSIS — Z803 Family history of malignant neoplasm of breast: Secondary | ICD-10-CM

## 2020-01-29 DIAGNOSIS — Z969 Presence of functional implant, unspecified: Secondary | ICD-10-CM | POA: Diagnosis not present

## 2020-01-29 DIAGNOSIS — D62 Acute posthemorrhagic anemia: Secondary | ICD-10-CM | POA: Diagnosis not present

## 2020-01-29 DIAGNOSIS — S72001K Fracture of unspecified part of neck of right femur, subsequent encounter for closed fracture with nonunion: Secondary | ICD-10-CM

## 2020-01-29 DIAGNOSIS — J159 Unspecified bacterial pneumonia: Secondary | ICD-10-CM | POA: Diagnosis not present

## 2020-01-29 DIAGNOSIS — S4991XA Unspecified injury of right shoulder and upper arm, initial encounter: Secondary | ICD-10-CM | POA: Diagnosis not present

## 2020-01-29 DIAGNOSIS — E669 Obesity, unspecified: Secondary | ICD-10-CM | POA: Diagnosis present

## 2020-01-29 DIAGNOSIS — M6281 Muscle weakness (generalized): Secondary | ICD-10-CM | POA: Diagnosis not present

## 2020-01-29 DIAGNOSIS — Z6833 Body mass index (BMI) 33.0-33.9, adult: Secondary | ICD-10-CM | POA: Diagnosis not present

## 2020-01-29 DIAGNOSIS — S72141K Displaced intertrochanteric fracture of right femur, subsequent encounter for closed fracture with nonunion: Secondary | ICD-10-CM

## 2020-01-29 DIAGNOSIS — Z7389 Other problems related to life management difficulty: Secondary | ICD-10-CM | POA: Diagnosis not present

## 2020-01-29 DIAGNOSIS — Z7401 Bed confinement status: Secondary | ICD-10-CM | POA: Diagnosis not present

## 2020-01-29 DIAGNOSIS — Z823 Family history of stroke: Secondary | ICD-10-CM

## 2020-01-29 DIAGNOSIS — Z792 Long term (current) use of antibiotics: Secondary | ICD-10-CM | POA: Diagnosis not present

## 2020-01-29 DIAGNOSIS — W1830XD Fall on same level, unspecified, subsequent encounter: Secondary | ICD-10-CM

## 2020-01-29 DIAGNOSIS — R52 Pain, unspecified: Secondary | ICD-10-CM | POA: Diagnosis not present

## 2020-01-29 DIAGNOSIS — G8918 Other acute postprocedural pain: Secondary | ICD-10-CM | POA: Diagnosis not present

## 2020-01-29 DIAGNOSIS — Z96641 Presence of right artificial hip joint: Secondary | ICD-10-CM | POA: Diagnosis not present

## 2020-01-29 DIAGNOSIS — E785 Hyperlipidemia, unspecified: Secondary | ICD-10-CM | POA: Diagnosis not present

## 2020-01-29 DIAGNOSIS — D509 Iron deficiency anemia, unspecified: Secondary | ICD-10-CM | POA: Diagnosis not present

## 2020-01-29 DIAGNOSIS — Y92014 Private driveway to single-family (private) house as the place of occurrence of the external cause: Secondary | ICD-10-CM | POA: Diagnosis not present

## 2020-01-29 DIAGNOSIS — S42201D Unspecified fracture of upper end of right humerus, subsequent encounter for fracture with routine healing: Secondary | ICD-10-CM | POA: Diagnosis not present

## 2020-01-29 DIAGNOSIS — Z8371 Family history of colonic polyps: Secondary | ICD-10-CM | POA: Diagnosis not present

## 2020-01-29 DIAGNOSIS — R2681 Unsteadiness on feet: Secondary | ICD-10-CM | POA: Diagnosis not present

## 2020-01-29 DIAGNOSIS — K5730 Diverticulosis of large intestine without perforation or abscess without bleeding: Secondary | ICD-10-CM | POA: Diagnosis not present

## 2020-01-29 DIAGNOSIS — K589 Irritable bowel syndrome without diarrhea: Secondary | ICD-10-CM | POA: Diagnosis not present

## 2020-01-29 DIAGNOSIS — W010XXA Fall on same level from slipping, tripping and stumbling without subsequent striking against object, initial encounter: Secondary | ICD-10-CM | POA: Diagnosis present

## 2020-01-29 DIAGNOSIS — Z79899 Other long term (current) drug therapy: Secondary | ICD-10-CM | POA: Diagnosis not present

## 2020-01-29 DIAGNOSIS — Z9889 Other specified postprocedural states: Secondary | ICD-10-CM

## 2020-01-29 HISTORY — PX: REVERSE SHOULDER ARTHROPLASTY: SHX5054

## 2020-01-29 SURGERY — ARTHROPLASTY, SHOULDER, TOTAL, REVERSE
Anesthesia: General | Site: Shoulder | Laterality: Right

## 2020-01-29 MED ORDER — STERILE WATER FOR IRRIGATION IR SOLN
Status: DC | PRN
  Administered 2020-01-29: 1000 mL

## 2020-01-29 MED ORDER — BUPIVACAINE HCL (PF) 0.5 % IJ SOLN
INTRAMUSCULAR | Status: DC | PRN
  Administered 2020-01-29: 15 mL via PERINEURAL

## 2020-01-29 MED ORDER — CEFAZOLIN SODIUM-DEXTROSE 1-4 GM/50ML-% IV SOLN
1.0000 g | Freq: Four times a day (QID) | INTRAVENOUS | Status: AC
  Administered 2020-01-29 – 2020-01-30 (×3): 1 g via INTRAVENOUS
  Filled 2020-01-29 (×4): qty 50

## 2020-01-29 MED ORDER — METHOCARBAMOL 500 MG PO TABS
500.0000 mg | ORAL_TABLET | Freq: Four times a day (QID) | ORAL | Status: DC | PRN
  Administered 2020-01-31 – 2020-02-05 (×10): 500 mg via ORAL
  Filled 2020-01-29 (×11): qty 1

## 2020-01-29 MED ORDER — FENTANYL CITRATE (PF) 100 MCG/2ML IJ SOLN
INTRAMUSCULAR | Status: DC | PRN
  Administered 2020-01-29: 50 ug via INTRAVENOUS

## 2020-01-29 MED ORDER — OXYCODONE HCL 5 MG/5ML PO SOLN: 5.0000 mg | Freq: Once | ORAL | Status: DC | PRN

## 2020-01-29 MED ORDER — OXYCODONE HCL 5 MG PO TABS: 5.0000 mg | ORAL_TABLET | Freq: Once | ORAL | Status: DC | PRN

## 2020-01-29 MED ORDER — 0.9 % SODIUM CHLORIDE (POUR BTL) OPTIME
TOPICAL | Status: DC | PRN
  Administered 2020-01-29: 1000 mL

## 2020-01-29 MED ORDER — EPINEPHRINE 0.3 MG/0.3ML IJ SOAJ
0.3000 mg | Freq: Every day | INTRAMUSCULAR | Status: DC | PRN
  Filled 2020-01-29: qty 0.6

## 2020-01-29 MED ORDER — ALUM & MAG HYDROXIDE-SIMETH 200-200-20 MG/5ML PO SUSP: 30.0000 mL | ORAL | Status: DC | PRN

## 2020-01-29 MED ORDER — BUPIVACAINE LIPOSOME 1.3 % IJ SUSP
INTRAMUSCULAR | Status: DC | PRN
  Administered 2020-01-29: 10 mL via PERINEURAL

## 2020-01-29 MED ORDER — LACTATED RINGERS IV SOLN: INTRAVENOUS | Status: DC

## 2020-01-29 MED ORDER — FENTANYL CITRATE (PF) 100 MCG/2ML IJ SOLN
50.0000 ug | INTRAMUSCULAR | Status: AC
  Administered 2020-01-29 (×2): 50 ug via INTRAVENOUS
  Filled 2020-01-29: qty 2

## 2020-01-29 MED ORDER — POVIDONE-IODINE 10 % EX SWAB
2.0000 "application " | Freq: Once | CUTANEOUS | Status: AC
  Administered 2020-01-29: 2 via TOPICAL

## 2020-01-29 MED ORDER — POLYETHYLENE GLYCOL 3350 17 G PO PACK
17.0000 g | PACK | Freq: Every day | ORAL | Status: DC | PRN
  Administered 2020-01-31: 17 g via ORAL
  Filled 2020-01-29: qty 1

## 2020-01-29 MED ORDER — VITAMIN D3 25 MCG (1000 UNIT) PO TABS
5000.0000 [IU] | ORAL_TABLET | Freq: Every day | ORAL | Status: DC
  Administered 2020-01-30 – 2020-02-06 (×7): 5000 [IU] via ORAL
  Filled 2020-01-29 (×7): qty 5

## 2020-01-29 MED ORDER — MORPHINE SULFATE (PF) 2 MG/ML IV SOLN
0.5000 mg | INTRAVENOUS | Status: DC | PRN
  Administered 2020-01-30 – 2020-02-03 (×4): 1 mg via INTRAVENOUS
  Filled 2020-01-29 (×5): qty 1

## 2020-01-29 MED ORDER — TRANEXAMIC ACID-NACL 1000-0.7 MG/100ML-% IV SOLN
INTRAVENOUS | Status: AC
  Filled 2020-01-29: qty 100

## 2020-01-29 MED ORDER — CHLORHEXIDINE GLUCONATE 4 % EX LIQD: 60.0000 mL | Freq: Once | CUTANEOUS | Status: DC

## 2020-01-29 MED ORDER — ACETAMINOPHEN 325 MG PO TABS
325.0000 mg | ORAL_TABLET | Freq: Four times a day (QID) | ORAL | Status: DC | PRN
  Administered 2020-01-31 (×2): 650 mg via ORAL
  Filled 2020-01-29 (×2): qty 2

## 2020-01-29 MED ORDER — DIPHENHYDRAMINE HCL 12.5 MG/5ML PO ELIX
12.5000 mg | ORAL_SOLUTION | ORAL | Status: DC | PRN
  Administered 2020-02-01: 12.5 mg via ORAL
  Filled 2020-01-29: qty 5

## 2020-01-29 MED ORDER — MENTHOL 3 MG MT LOZG: 1.0000 | LOZENGE | OROMUCOSAL | Status: DC | PRN

## 2020-01-29 MED ORDER — PAROXETINE HCL 10 MG PO TABS
10.0000 mg | ORAL_TABLET | Freq: Every day | ORAL | Status: DC
  Administered 2020-01-30 – 2020-02-06 (×7): 10 mg via ORAL
  Filled 2020-01-29 (×9): qty 1

## 2020-01-29 MED ORDER — DEXAMETHASONE SODIUM PHOSPHATE 10 MG/ML IJ SOLN
INTRAMUSCULAR | Status: DC | PRN
  Administered 2020-01-29: 5 mg via INTRAVENOUS

## 2020-01-29 MED ORDER — ZOLPIDEM TARTRATE 5 MG PO TABS
5.0000 mg | ORAL_TABLET | Freq: Every evening | ORAL | Status: DC | PRN
  Filled 2020-01-29: qty 1

## 2020-01-29 MED ORDER — ADULT MULTIVITAMIN W/MINERALS CH
1.0000 | ORAL_TABLET | Freq: Every day | ORAL | Status: DC
  Administered 2020-01-30 – 2020-02-06 (×7): 1 via ORAL
  Filled 2020-01-29 (×7): qty 1

## 2020-01-29 MED ORDER — METOCLOPRAMIDE HCL 5 MG/ML IJ SOLN: 5.0000 mg | Freq: Three times a day (TID) | INTRAMUSCULAR | Status: DC | PRN

## 2020-01-29 MED ORDER — ACETAMINOPHEN 500 MG PO TABS
1000.0000 mg | ORAL_TABLET | Freq: Once | ORAL | Status: AC
  Administered 2020-01-29: 12:00:00 1000 mg via ORAL
  Filled 2020-01-29: qty 2

## 2020-01-29 MED ORDER — FENTANYL CITRATE (PF) 100 MCG/2ML IJ SOLN
INTRAMUSCULAR | Status: AC
  Filled 2020-01-29: qty 2

## 2020-01-29 MED ORDER — PROPOFOL 10 MG/ML IV BOLUS
INTRAVENOUS | Status: AC
  Filled 2020-01-29: qty 20

## 2020-01-29 MED ORDER — SUGAMMADEX SODIUM 200 MG/2ML IV SOLN
INTRAVENOUS | Status: DC | PRN
  Administered 2020-01-29: 200 mg via INTRAVENOUS

## 2020-01-29 MED ORDER — TRANEXAMIC ACID-NACL 1000-0.7 MG/100ML-% IV SOLN
INTRAVENOUS | Status: DC | PRN
  Administered 2020-01-29: 1000 mg via INTRAVENOUS

## 2020-01-29 MED ORDER — POTASSIUM CHLORIDE IN NACL 20-0.45 MEQ/L-% IV SOLN
INTRAVENOUS | Status: DC
  Filled 2020-01-29 (×3): qty 1000

## 2020-01-29 MED ORDER — METOCLOPRAMIDE HCL 5 MG PO TABS: 5.0000 mg | ORAL_TABLET | Freq: Three times a day (TID) | ORAL | Status: DC | PRN

## 2020-01-29 MED ORDER — ACETAMINOPHEN 500 MG PO TABS
500.0000 mg | ORAL_TABLET | Freq: Four times a day (QID) | ORAL | Status: AC
  Administered 2020-01-29 – 2020-01-30 (×4): 500 mg via ORAL
  Filled 2020-01-29 (×4): qty 1

## 2020-01-29 MED ORDER — FENTANYL CITRATE (PF) 100 MCG/2ML IJ SOLN: 25.0000 ug | INTRAMUSCULAR | Status: DC | PRN

## 2020-01-29 MED ORDER — ONDANSETRON HCL 4 MG PO TABS: 4.0000 mg | ORAL_TABLET | Freq: Four times a day (QID) | ORAL | Status: DC | PRN

## 2020-01-29 MED ORDER — SENNA-DOCUSATE SODIUM 8.6-50 MG PO TABS: 2.0000 | ORAL_TABLET | Freq: Every day | ORAL | 1 refills | Status: DC

## 2020-01-29 MED ORDER — PHENYLEPHRINE HCL-NACL 10-0.9 MG/250ML-% IV SOLN
INTRAVENOUS | Status: DC | PRN
  Administered 2020-01-29: 80 ug/min via INTRAVENOUS

## 2020-01-29 MED ORDER — METHOCARBAMOL 500 MG IVPB - SIMPLE MED
500.0000 mg | Freq: Four times a day (QID) | INTRAVENOUS | Status: DC | PRN
  Filled 2020-01-29: qty 50

## 2020-01-29 MED ORDER — PROPOFOL 10 MG/ML IV BOLUS
INTRAVENOUS | Status: DC | PRN
  Administered 2020-01-29: 150 mg via INTRAVENOUS

## 2020-01-29 MED ORDER — KETOROLAC TROMETHAMINE 15 MG/ML IJ SOLN
7.5000 mg | Freq: Four times a day (QID) | INTRAMUSCULAR | Status: AC
  Administered 2020-01-29 – 2020-01-30 (×4): 7.5 mg via INTRAVENOUS
  Filled 2020-01-29 (×5): qty 1

## 2020-01-29 MED ORDER — LORATADINE 10 MG PO TABS
10.0000 mg | ORAL_TABLET | Freq: Every day | ORAL | Status: DC
  Administered 2020-01-30 – 2020-02-06 (×7): 10 mg via ORAL
  Filled 2020-01-29 (×7): qty 1

## 2020-01-29 MED ORDER — DEXAMETHASONE SODIUM PHOSPHATE 10 MG/ML IJ SOLN
INTRAMUSCULAR | Status: AC
  Filled 2020-01-29: qty 1

## 2020-01-29 MED ORDER — HYDROCODONE-ACETAMINOPHEN 5-325 MG PO TABS
1.0000 | ORAL_TABLET | ORAL | Status: DC | PRN
  Administered 2020-01-30 – 2020-01-31 (×2): 2 via ORAL
  Filled 2020-01-29 (×2): qty 2

## 2020-01-29 MED ORDER — BISACODYL 10 MG RE SUPP: 10.0000 mg | Freq: Every day | RECTAL | Status: DC | PRN

## 2020-01-29 MED ORDER — DOCUSATE SODIUM 100 MG PO CAPS
100.0000 mg | ORAL_CAPSULE | Freq: Two times a day (BID) | ORAL | Status: DC
  Administered 2020-01-29 – 2020-01-31 (×4): 100 mg via ORAL
  Filled 2020-01-29 (×4): qty 1

## 2020-01-29 MED ORDER — PANTOPRAZOLE SODIUM 40 MG PO TBEC
40.0000 mg | DELAYED_RELEASE_TABLET | Freq: Every day | ORAL | Status: DC
  Administered 2020-01-30 – 2020-01-31 (×2): 40 mg via ORAL
  Filled 2020-01-29 (×2): qty 1

## 2020-01-29 MED ORDER — ROCURONIUM BROMIDE 10 MG/ML (PF) SYRINGE
PREFILLED_SYRINGE | INTRAVENOUS | Status: AC
  Filled 2020-01-29: qty 10

## 2020-01-29 MED ORDER — ONDANSETRON HCL 4 MG PO TABS: 4.0000 mg | ORAL_TABLET | Freq: Three times a day (TID) | ORAL | 0 refills | Status: AC | PRN

## 2020-01-29 MED ORDER — MAGNESIUM CITRATE PO SOLN: 1.0000 | Freq: Once | ORAL | Status: DC | PRN

## 2020-01-29 MED ORDER — ONDANSETRON HCL 4 MG/2ML IJ SOLN
4.0000 mg | Freq: Four times a day (QID) | INTRAMUSCULAR | Status: DC | PRN
  Administered 2020-02-01: 4 mg via INTRAVENOUS

## 2020-01-29 MED ORDER — BUPIVACAINE HCL 0.25 % IJ SOLN
INTRAMUSCULAR | Status: AC
  Filled 2020-01-29: qty 1

## 2020-01-29 MED ORDER — ONDANSETRON HCL 4 MG/2ML IJ SOLN
INTRAMUSCULAR | Status: AC
  Filled 2020-01-29: qty 2

## 2020-01-29 MED ORDER — EPHEDRINE SULFATE-NACL 50-0.9 MG/10ML-% IV SOSY
PREFILLED_SYRINGE | INTRAVENOUS | Status: DC | PRN
  Administered 2020-01-29: 10 mg via INTRAVENOUS

## 2020-01-29 MED ORDER — PHENYLEPHRINE 40 MCG/ML (10ML) SYRINGE FOR IV PUSH (FOR BLOOD PRESSURE SUPPORT)
PREFILLED_SYRINGE | INTRAVENOUS | Status: DC | PRN
  Administered 2020-01-29: 120 ug via INTRAVENOUS
  Administered 2020-01-29: 80 ug via INTRAVENOUS

## 2020-01-29 MED ORDER — MIDAZOLAM HCL 2 MG/2ML IJ SOLN
1.0000 mg | INTRAMUSCULAR | Status: AC
  Administered 2020-01-29 (×2): 1 mg via INTRAVENOUS
  Filled 2020-01-29: qty 2

## 2020-01-29 MED ORDER — BUPROPION HCL ER (XL) 300 MG PO TB24
300.0000 mg | ORAL_TABLET | Freq: Every day | ORAL | Status: DC
  Administered 2020-01-30 – 2020-02-06 (×7): 300 mg via ORAL
  Filled 2020-01-29 (×7): qty 1

## 2020-01-29 MED ORDER — ONDANSETRON HCL 4 MG/2ML IJ SOLN: 4.0000 mg | Freq: Four times a day (QID) | INTRAMUSCULAR | Status: DC | PRN

## 2020-01-29 MED ORDER — OXYCODONE HCL 5 MG PO TABS: 5.0000 mg | ORAL_TABLET | ORAL | 0 refills | Status: DC | PRN

## 2020-01-29 MED ORDER — HYDROCODONE-ACETAMINOPHEN 7.5-325 MG PO TABS
1.0000 | ORAL_TABLET | ORAL | Status: DC | PRN
  Administered 2020-01-31 (×3): 2 via ORAL
  Filled 2020-01-29 (×3): qty 2

## 2020-01-29 MED ORDER — ONDANSETRON HCL 4 MG/2ML IJ SOLN
INTRAMUSCULAR | Status: DC | PRN
  Administered 2020-01-29: 4 mg via INTRAVENOUS

## 2020-01-29 MED ORDER — LIDOCAINE 2% (20 MG/ML) 5 ML SYRINGE
INTRAMUSCULAR | Status: AC
  Filled 2020-01-29: qty 5

## 2020-01-29 MED ORDER — LIDOCAINE 2% (20 MG/ML) 5 ML SYRINGE
INTRAMUSCULAR | Status: DC | PRN
  Administered 2020-01-29: 20 mg via INTRAVENOUS

## 2020-01-29 MED ORDER — ROCURONIUM BROMIDE 50 MG/5ML IV SOSY
PREFILLED_SYRINGE | INTRAVENOUS | Status: DC | PRN
  Administered 2020-01-29: 10 mg via INTRAVENOUS
  Administered 2020-01-29: 50 mg via INTRAVENOUS

## 2020-01-29 MED ORDER — PHENOL 1.4 % MT LIQD
1.0000 | OROMUCOSAL | Status: DC | PRN
  Filled 2020-01-29: qty 177

## 2020-01-29 MED ORDER — CEFAZOLIN SODIUM-DEXTROSE 2-4 GM/100ML-% IV SOLN
2.0000 g | INTRAVENOUS | Status: AC
  Administered 2020-01-29: 2 g via INTRAVENOUS
  Filled 2020-01-29: qty 100

## 2020-01-29 SURGICAL SUPPLY — 64 items
AID PSTN UNV HD RSTRNT DISP (MISCELLANEOUS) ×1
BAG SPEC THK2 15X12 ZIP CLS (MISCELLANEOUS) ×1
BAG ZIPLOCK 12X15 (MISCELLANEOUS) ×2 IMPLANT
BASEPLATE GLENOSPHERE 25 (Plate) ×1 IMPLANT
BEARING HUMERAL SHLDER 36M STD (Shoulder) IMPLANT
BIT DRILL TWIST 2.7 (BIT) ×1 IMPLANT
BLADE SAW SAG 73X25 THK (BLADE) ×1
BLADE SAW SGTL 73X25 THK (BLADE) ×1 IMPLANT
BOOTIES KNEE HIGH SLOAN (MISCELLANEOUS) ×3 IMPLANT
BOWL SMART MIX CTS (DISPOSABLE) IMPLANT
BRNG HUM STD 36 RVRS SHLDR (Shoulder) ×1 IMPLANT
CEMENT BONE R 1X40 (Cement) ×1 IMPLANT
CLSR STERI-STRIP ANTIMIC 1/2X4 (GAUZE/BANDAGES/DRESSINGS) ×2 IMPLANT
COVER BACK TABLE 60X90IN (DRAPES) ×2 IMPLANT
COVER MAYO STAND STRL (DRAPES) ×2 IMPLANT
COVER SURGICAL LIGHT HANDLE (MISCELLANEOUS) ×2 IMPLANT
COVER WAND RF STERILE (DRAPES) IMPLANT
DECANTER SPIKE VIAL GLASS SM (MISCELLANEOUS) IMPLANT
DRAPE SHEET LG 3/4 BI-LAMINATE (DRAPES) ×4 IMPLANT
DRAPE SURG 17X11 SM STRL (DRAPES) ×2 IMPLANT
DRAPE U-SHAPE 47X51 STRL (DRAPES) ×2 IMPLANT
DRSG MEPILEX BORDER 4X8 (GAUZE/BANDAGES/DRESSINGS) ×2 IMPLANT
DURAPREP 26ML APPLICATOR (WOUND CARE) ×2 IMPLANT
ELECT REM PT RETURN 15FT ADLT (MISCELLANEOUS) ×2 IMPLANT
GLENOID SPHERE STD STRL 36MM (Orthopedic Implant) ×1 IMPLANT
GLOVE BIO SURGEON STRL SZ7 (GLOVE) ×2 IMPLANT
GLOVE BIOGEL PI IND STRL 7.0 (GLOVE) ×1 IMPLANT
GLOVE BIOGEL PI IND STRL 8 (GLOVE) ×1 IMPLANT
GLOVE BIOGEL PI INDICATOR 7.0 (GLOVE) ×1
GLOVE BIOGEL PI INDICATOR 8 (GLOVE) ×1
GLOVE ORTHO TXT STRL SZ7.5 (GLOVE) ×2 IMPLANT
GOWN STRL REUS W/TWL 2XL LVL3 (GOWN DISPOSABLE) ×1 IMPLANT
GOWN STRL REUS W/TWL LRG LVL3 (GOWN DISPOSABLE) ×3 IMPLANT
HOOD PEEL AWAY FLYTE STAYCOOL (MISCELLANEOUS) ×4 IMPLANT
KIT BASIN OR (CUSTOM PROCEDURE TRAY) ×2 IMPLANT
KIT TURNOVER KIT A (KITS) IMPLANT
PACK SHOULDER (CUSTOM PROCEDURE TRAY) ×2 IMPLANT
PENCIL SMOKE EVACUATOR (MISCELLANEOUS) IMPLANT
PIN STEINMANN THREADED TIP (PIN) ×1 IMPLANT
PIN THREADED REVERSE (PIN) ×1 IMPLANT
PROTECTOR NERVE ULNAR (MISCELLANEOUS) ×1 IMPLANT
RESTRAINT HEAD UNIVERSAL NS (MISCELLANEOUS) ×2 IMPLANT
SCREW BONE CORT 6.5X35MM (Screw) ×1 IMPLANT
SCREW BONE LOCKING 4.75X30X3.5 (Screw) ×1 IMPLANT
SCREW LOCKING 4.75MMX15MM (Screw) ×2 IMPLANT
SCREW LOCKING STRL 4.75X25X3.5 (Screw) ×1 IMPLANT
SHOULDER HUMERAL BEAR 36M STD (Shoulder) ×2 IMPLANT
SLING ARM FOAM STRAP LRG (SOFTGOODS) ×1 IMPLANT
SLING ARM IMMOBILIZER LRG (SOFTGOODS) ×1 IMPLANT
STEM HUMERAL STRL 10MMX122MM (Stem) ×1 IMPLANT
SUCTION FRAZIER HANDLE 12FR (TUBING) ×1
SUCTION TUBE FRAZIER 12FR DISP (TUBING) ×1 IMPLANT
SUPPORT WRAP ARM LG (MISCELLANEOUS) ×2 IMPLANT
SUT ETHIBOND CT1 BRD #0 30IN (SUTURE) ×1 IMPLANT
SUT FIBERWIRE #2 38 REV NDL BL (SUTURE) ×10
SUT VIC AB 1 CT1 36 (SUTURE) ×2 IMPLANT
SUT VIC AB 2-0 CT1 27 (SUTURE) ×2
SUT VIC AB 2-0 CT1 TAPERPNT 27 (SUTURE) ×1 IMPLANT
SUT VIC AB 3-0 SH 8-18 (SUTURE) ×2 IMPLANT
SUTURE FIBERWR#2 38 REV NDL BL (SUTURE) ×4 IMPLANT
TOWEL OR 17X26 10 PK STRL BLUE (TOWEL DISPOSABLE) ×2 IMPLANT
TOWEL OR NON WOVEN STRL DISP B (DISPOSABLE) ×2 IMPLANT
TOWER CARTRIDGE SMART MIX (DISPOSABLE) IMPLANT
TRAY HUMERAL STD +10 (Shoulder) ×1 IMPLANT

## 2020-01-29 NOTE — Anesthesia Preprocedure Evaluation (Signed)
Anesthesia Evaluation  Patient identified by MRN, date of birth, ID band Patient awake    Reviewed: Allergy & Precautions, H&P , NPO status , Patient's Chart, lab work & pertinent test results  Airway Mallampati: II   Neck ROM: full    Dental   Pulmonary Current Smoker and Patient abstained from smoking.,    breath sounds clear to auscultation       Cardiovascular negative cardio ROS   Rhythm:regular Rate:Normal     Neuro/Psych PSYCHIATRIC DISORDERS Depression    GI/Hepatic GERD  ,  Endo/Other  obese  Renal/GU      Musculoskeletal  (+) Arthritis ,   Abdominal   Peds  Hematology   Anesthesia Other Findings   Reproductive/Obstetrics                             Anesthesia Physical Anesthesia Plan  ASA: II  Anesthesia Plan: General   Post-op Pain Management:  Regional for Post-op pain   Induction: Intravenous  PONV Risk Score and Plan: 2 and Ondansetron, Dexamethasone and Treatment may vary due to age or medical condition  Airway Management Planned: Oral ETT  Additional Equipment:   Intra-op Plan:   Post-operative Plan: Extubation in OR  Informed Consent: I have reviewed the patients History and Physical, chart, labs and discussed the procedure including the risks, benefits and alternatives for the proposed anesthesia with the patient or authorized representative who has indicated his/her understanding and acceptance.       Plan Discussed with: CRNA, Anesthesiologist and Surgeon  Anesthesia Plan Comments:         Anesthesia Quick Evaluation

## 2020-01-29 NOTE — Plan of Care (Signed)
  Problem: Activity: Goal: Ability to avoid complications of mobility impairment will improve Outcome: Progressing   Problem: Education: Goal: Verbalization of understanding the information provided will improve Outcome: Progressing   Problem: Physical Regulation: Goal: Postoperative complications will be avoided or minimized Outcome: Progressing   Problem: Respiratory: Goal: Ability to maintain a clear airway will improve Outcome: Progressing   Problem: Pain Management: Goal: Pain level will decrease Outcome: Progressing   Problem: Tissue Perfusion: Goal: Ability to maintain adequate tissue perfusion will improve Outcome: Progressing

## 2020-01-29 NOTE — Transfer of Care (Signed)
Immediate Anesthesia Transfer of Care Note  Patient: Emily Horton  Procedure(s) Performed: RIGHT REVERSE SHOULDER ARTHROPLASTY (Right Shoulder)  Patient Location: PACU  Anesthesia Type:General  Level of Consciousness: awake, alert , oriented and patient cooperative  Airway & Oxygen Therapy: Patient Spontanous Breathing and Patient connected to face mask oxygen  Post-op Assessment: Report given to RN, Post -op Vital signs reviewed and stable and Patient moving all extremities X 4  Post vital signs: stable  Last Vitals:  Vitals Value Taken Time  BP 126/62 01/29/20 1706  Temp    Pulse 103 01/29/20 1712  Resp 26 01/29/20 1712  SpO2 96 % 01/29/20 1712  Vitals shown include unvalidated device data.  Last Pain:  Vitals:   01/29/20 1128  TempSrc: Oral  PainSc: 10-Worst pain ever      Patients Stated Pain Goal: 9 ([id number])  Complications: No apparent anesthesia complications

## 2020-01-29 NOTE — Progress Notes (Addendum)
After speaking with the family, it is clear that Mrs. Emily Horton has had progressive deterioration in independence and capacity to care for herself.  Her proximal humerus fracture is also complicated by the fact that she has a right proximal femur nonunion, and is currently still under the care of Dr. Marcelino Scot for that.  She may not be able to be discharged home, and will likely require inpatient admission and may require skilled nursing stay given the complex constellation of injuries including her proximal humerus fracture and her right hip nonunion.  She is at extremely high fall risk.  Johnny Bridge, MD

## 2020-01-29 NOTE — Anesthesia Procedure Notes (Signed)
Anesthesia Regional Block: Interscalene brachial plexus block   Pre-Anesthetic Checklist: ,, timeout performed, Correct Patient, Correct Site, Correct Laterality, Correct Procedure, Correct Position, site marked, Risks and benefits discussed,  Surgical consent,  Pre-op evaluation,  At surgeon's request and post-op pain management  Laterality: Right  Prep: chloraprep       Needles:  Injection technique: Single-shot  Needle Type: Echogenic Stimulator Needle     Needle Length: 5cm  Needle Gauge: 22     Additional Needles:   Procedures:, nerve stimulator,,,,,,,   Nerve Stimulator or Paresthesia:  Response: biceps flexion, 0.45 mA,   Additional Responses:   Narrative:  Start time: 01/29/2020 1:00 PM End time: 01/29/2020 1:06 PM Injection made incrementally with aspirations every 5 mL.  Performed by: Personally  Anesthesiologist: Albertha Ghee, MD  Additional Notes: Functioning IV was confirmed and monitors were applied.  A 74mm 22ga Arrow echogenic stimulator needle was used. Sterile prep and drape,hand hygiene and sterile gloves were used.  Negative aspiration and negative test dose prior to incremental administration of local anesthetic. The patient tolerated the procedure well.  Ultrasound guidance: relevent anatomy identified, needle position confirmed, local anesthetic spread visualized around nerve(s), vascular puncture avoided.  Image printed for medical record.

## 2020-01-29 NOTE — Anesthesia Procedure Notes (Signed)

## 2020-01-29 NOTE — H&P (Signed)
PREOPERATIVE H&P  Chief Complaint: right shoulder pain  HPI: Emily Horton is a 74 y.o. female who presents for preoperative history and physical with a diagnosis of right head splitting proximal humerus fracture. Symptoms are rated as moderate to severe, and have been worsening.  This is significantly impairing activities of daily living.  She has elected for surgical management. This occurred after a fall earlier a couple of days ago.    Past Medical History:  Diagnosis Date  . Colon polyps 09-25-2007   Colonoscopy  . Depression   . Diverticulosis of colon (without mention of hemorrhage) 09-25-2007   Colonoscopy  . GERD (gastroesophageal reflux disease)   . Gynecological examination    sees Dr. Carren Rang   . Hyperlipidemia    Past Surgical History:  Procedure Laterality Date  . COLONOSCOPY  12/30/2015   per Dr. Henrene Pastor, benign  polyps, repeat in 5  yrs.   . ESOPHAGOGASTRODUODENOSCOPY  06-30-12   per Dr. Sharlett Iles, reflux but no Barretts seen   . INTRAMEDULLARY (IM) NAIL INTERTROCHANTERIC Right 06/22/2019  . INTRAMEDULLARY (IM) NAIL INTERTROCHANTERIC Right 06/22/2019   Procedure: INTRAMEDULLARY (IM) NAIL INTERTROCHANTRIC;  Surgeon: Altamese Jamest[place], MD;  Location: [facility];  Service: Orthopedics;  Laterality: Right;   Social History   Socioeconomic History  . Marital status: Married    Spouse name: Not on file  . Number of children: 2  . Years of education: Not on file  . Highest education level: Not on file  Occupational History  . Occupation: retired Pharmacist, hospital  Tobacco Use  . Smoking status: Current Every Day Smoker    Packs/day: 0.50    Years: 30.00    Pack years: 15.00    Types: Cigarettes  . Smokeless tobacco: Never Used  . Tobacco comment: or less  Substance and Sexual Activity  . Alcohol use: Yes    Alcohol/week: 0.0 standard drinks    Comment: occasional  . Drug use: No  . Sexual activity: Not on file  Other Topics Concern  . Not on file  Social History Narrative  .  Not on file   Social Determinants of Health   Financial Resource Strain:   . Difficulty of Paying Living Expenses: Not on file  Food Insecurity:   . Worried About Charity fundraiser in the Last Year: Not on file  . Ran Out of Food in the Last Year: Not on file  Transportation Needs:   . Lack of Transportation (Medical): Not on file  . Lack of Transportation (Non-Medical): Not on file  Physical Activity:   . Days of Exercise per Week: Not on file  . Minutes of Exercise per Session: Not on file  Stress:   . Feeling of Stress : Not on file  Social Connections:   . Frequency of Communication with Friends and Family: Not on file  . Frequency of Social Gatherings with Friends and Family: Not on file  . Attends Religious Services: Not on file  . Active Member of Clubs or Organizations: Not on file  . Attends Archivist Meetings: Not on file  . Marital Status: Not on file   Family History  Problem Relation Age of Onset  . Breast cancer Maternal Grandmother   . Crohn's disease Father   . Colon polyps Father   . Stroke Paternal Grandfather    Allergies  Allergen Reactions  . Eggs Or Egg-Derived Products     Hives   . Zoloft [Sertraline Hcl]     Headaches  Prior to Admission medications   Medication Sig Start Date End Date Taking? Authorizing Provider  buPROPion (WELLBUTRIN XL) 300 MG 24 hr tablet TAKE 1 TABLET BY MOUTH EVERY MORNING Patient taking differently: Take 300 mg by mouth daily.  08/02/19  Yes Laurey Morale, MD  cetirizine (ZYRTEC) 10 MG tablet TAKE 1 TABLET(10 MG) BY MOUTH DAILY Patient taking differently: Take 10 mg by mouth daily.  06/05/19  Yes Laurey Morale, MD  Cholecalciferol (VITAMIN D3) 125 MCG (5000 UT) TABS Take 5,000 Units by mouth daily.   Yes [provider]  GOODYS EXTRA STRENGTH [phone] MG PACK Take 1 packet by mouth daily as needed (pain.).   Yes [provider]  Multiple Vitamins-Minerals (ADULT ONE DAILY GUMMIES PO)  Take 1 tablet by mouth daily.   Yes [provider]  omeprazole (PRILOSEC) 40 MG capsule TAKE 1 CAPSULE BY MOUTH TWICE DAILY Patient taking differently: Take 40 mg by mouth daily.  10/31/19  Yes Laurey Morale, MD  oxyCODONE (ROXICODONE) 5 MG immediate release tablet Take 1 tablet (5 mg total) by mouth every 4 (four) hours as needed for severe pain. 01/27/20  Yes Maudie Flakes, MD  PARoxetine (PAXIL) 10 MG tablet Take 10 mg by mouth daily.   Yes [provider]  aspirin-acetaminophen-caffeine (EXCEDRIN MIGRAINE) [phone] MG tablet Take 1 tablet by mouth every 6 (six) hours as needed for headache.    [provider]  EPINEPHrine 0.3 mg/0.3 mL IJ SOAJ injection Inject 0.3 mLs into the muscle daily as needed for anaphylaxis.  04/17/19   [provider]  hyoscyamine (LEVSIN SL) 0.125 MG SL tablet Place 1 tablet (0.125 mg total) under the tongue every 4 (four) hours as needed. Patient not taking: Reported on 01/28/2020 06/19/19   Laurey Morale, MD  polyethylene glycol (MIRALAX / GLYCOLAX) 17 g packet Take 17 g by mouth daily as needed for moderate constipation or severe constipation. 06/26/19   Aline August, MD  senna-docusate (SENOKOT-S) 8.6-50 MG tablet Take 1 tablet by mouth 2 (two) times daily. 06/26/19   Aline August, MD  triamcinolone cream (KENALOG) 0.1 % Apply 1 application topically 2 (two) times daily. 06/19/19   Laurey Morale, MD     Positive ROS: All other systems have been reviewed and were otherwise negative with the exception of those mentioned in the HPI and as above.  Physical Exam: General: Alert, no acute distress Cardiovascular: No pedal edema Respiratory: No cyanosis, no use of accessory musculature GI: No organomegaly, abdomen is soft and non-tender Skin: No lesions in the area of chief complaint Neurologic: Sensation intact distally Psychiatric: Patient is competent for consent with normal mood and affect Lymphatic: No axillary or  cervical lymphadenopathy  MUSCULOSKELETAL: right shoulder has ecchymosis and swelling and pain to palpation.  Neurologically intact distally.   Assessment: Right proximal humerus fracture with head split   Plan: Plan for Procedure(s): RIGHT REVERSE SHOULDER ARTHROPLASTY  The risks benefits and alternatives were discussed with the patient including but not limited to the risks of nonoperative treatment, versus surgical intervention including infection, bleeding, nerve injury,  blood clots, cardiopulmonary complications, morbidity, mortality, among others, and they were willing to proceed.    Patient's anticipated LOS is less than 2 midnights, meeting these requirements: - Younger than 73 - Lives within 1 hour of care - Has a competent adult at home to recover with post-op recover - NO history of  - Chronic pain requiring opiods  - Diabetes  -  Coronary Artery Disease  - Heart failure  - Heart attack  - Stroke  - DVT/VTE  - Cardiac arrhythmia  - Respiratory Failure/COPD  - Renal failure  - Anemia  - Advanced Liver disease        Johnny Bridge, MD Cell [phone]   01/29/2020 1:21 PM

## 2020-01-29 NOTE — Op Note (Signed)
01/29/2020  4:43 PM  PATIENT:  Emily Horton    PRE-OPERATIVE DIAGNOSIS: Right proximal humerus fracture, head split, four-part  POST-OPERATIVE DIAGNOSIS:  Same  PROCEDURE: RIGHT reverse Total Shoulder Arthroplasty  SURGEON:  Johnny Bridge, MD  PHYSICIAN ASSISTANT: Merlene Pulling, PA-C, present and scrubbed throughout the case, critical for completion in a timely fashion, and for retraction, instrumentation, and closure.  ANESTHESIA:   General with interscalene block using Exparel  ESTIMATED BLOOD LOSS: 200 mL  UNIQUE ASPECTS OF THE CASE: There was a fairly sizable piece still attached to the greater tuberosity as well as the lesser tuberosity.  The location of the shaft fracture was fairly low, I cemented the stem using a fracture stem at the 3 level, however I had a fair amount of height still to regain, and so after multiple trials I ended up using a 10 mm buildup, with a standard liner.  This was somewhat unusual, but I felt I needed this for stability.  The tuberosities closed over relatively nicely, although the greater tuberosity was probably still elevated superiorly somewhat off of the shaft.  I placed bone graft to augment healing.  PREOPERATIVE INDICATIONS:  Emily Horton is a  74 y.o. female with a diagnosis of RIGHT PROXIMAL HUMERUS FRACTURE who  elected for surgical management.    The risks benefits and alternatives were discussed with the patient preoperatively including but not limited to the risks of infection, bleeding, nerve injury, cardiopulmonary complications, the need for revision surgery, dislocation, brachial plexus palsy, tuberosity nonunion, loss of function, incomplete relief of pain, among others, and the patient was willing to proceed.  OPERATIVE IMPLANTS: Biomet size 10 humeral stem press-fit fracture stem cemented with a 40 mm reverse shoulder arthroplasty tray with a +10 buildup +0 tapered offset liner and a 36 mm glenosphere with a mini baseplate and 4 locking  screws and one central nonlocking screw.  OPERATIVE FINDINGS: Severely comminuted head splitting proximal humerus fracture with relatively weak bone.  OPERATIVE PROCEDURE: The patient was brought to the operating room and placed in the supine position. General anesthesia was administered. IV antibiotics were given.  Time out was performed. The upper extremity was prepped and draped in usual sterile fashion. The patient was in a beachchair position. Deltopectoral approach was carried out. The biceps was tenodesed to the pectoralis tendon with #2 Fiberwire.  I used an osteotome in the bicipital groove to perform a lesser tuberosity osteotomy, and then mobilized the head segment, and remove the articular pieces piecemeal.  I used some of the cancellous bone for bone graft.  I also removed bone from the greater tuberosity, attempting to contour the tuberosities down to a level where they could be reduced at the completion of the case.  I placed sutures through the greater tuberosity, a total of 2 of them, and 1 through the lesser tuberosity.  Deep retractors were placed, and I resected the biceps attachment, and then placed a guidepin into the center position on the glenoid, with slight inferior inclination. I then reamed over the guidepin, and this created a small metaphyseal cancellus blush inferiorly, removing just the cartilage to the subchondral bone superiorly. The base plate was selected and impacted place, and then I secured it centrally with a nonlocking screw, and I had excellent purchase both inferiorly and superiorly. I placed a short locking screws on anterior and posterior aspects.  I then turned my attention to the glenosphere, and impacted this into place, placing slight inferior offset (set  on B).   The glenosphere was completely seated, and had engagement of the [facility] taper. I then turned my attention back to the humerus.  I sequentially reamed up to a size 10, and then broached, and then  cemented in the 10 stem with the height at 3 showing above the fracture line.  I then trialed the prosthesis, and worked my way up, it was not until I got to a size 10 that I was satisfied with the soft tissue tension.  Therefore the above named components were selected. The shoulder felt stable throughout functional motion.  I irrigated the wounds copiously.   I then took the FiberWire that I had placed through the greater tuberosity, total of 2 of them, and one from the lesser tuberosity, and passed it through the anterior fins on the prosthesis, reduce the tuberosities as well as possible, and secured them, and then used a total of 2 additional FiberWire sewing the greater to the lesser tuberosity closing the split.  I also placed bone graft underneath the tuberosity repair and above the repair.  The tuberosities then moved as a single unit along with the prosthesis.  I then irrigated the shoulder copiously once more, repaired the deltopectoral interval with Ethibond in order to mark the deltopectoral interval, followed by subcutaneous Vicryl with Steri-Strips and sterile gauze for the skin. The patient was awakened and returned back in stable and satisfactory condition. There were no complications and She tolerated the procedure well.

## 2020-01-30 ENCOUNTER — Encounter: Payer: Self-pay | Admitting: *Deleted

## 2020-01-30 ENCOUNTER — Inpatient Hospital Stay (HOSPITAL_COMMUNITY): Payer: Medicare Other

## 2020-01-30 LAB — BASIC METABOLIC PANEL
Anion gap: 7 (ref 5–15)
BUN: 23 mg/dL (ref 8–23)
CO2: 24 mmol/L (ref 22–32)
Calcium: 8.4 mg/dL — ABNORMAL LOW (ref 8.9–10.3)
Chloride: 104 mmol/L (ref 98–111)
Creatinine, Ser: 0.83 mg/dL (ref 0.44–1.00)
GFR calc Af Amer: >60 mL/min (ref >60)
GFR calc non Af Amer: >60 mL/min (ref >60)
Glucose, Bld: 115 mg/dL — ABNORMAL HIGH (ref 70–99)
Potassium: 4.9 mmol/L (ref 3.5–5.1)
Sodium: 135 mmol/L (ref 135–145)

## 2020-01-30 LAB — CBC
HCT: 28.8 % — ABNORMAL LOW (ref 36.0–46.0)
Hemoglobin: 9 g/dL — ABNORMAL LOW (ref 12.0–15.0)
MCH: 30.8 pg (ref 26.0–34.0)
MCHC: 31.3 g/dL (ref 30.0–36.0)
MCV: 98.6 fL (ref 80.0–100.0)
Platelets: 176 10*3/uL (ref 150–400)
RBC: 2.92 MIL/uL — ABNORMAL LOW (ref 3.87–5.11)
RDW: 13.2 % (ref 11.5–15.5)
WBC: 9.5 10*3/uL (ref 4.0–10.5)
nRBC: 0 % (ref 0.0–0.2)

## 2020-01-30 NOTE — TOC Progression Note (Addendum)
Transition of Care [facility]) - Progression Note    Patient Details  Name: TZIREL MADIA MRN: [id number] Date of Birth: 07-20-1946  Transition of Care [facility]) CM/SW [place], [place] Phone Number: 01/30/2020, 4:04 PM  Clinical Narrative:    Daughter reports she is actively working to get the patient into [facility] for rehab. She provided a contact person Alla German [phone].  Ms. Judd Lien followed up, she reports they are waiting for the patient daughter to provide requested documents before confirming placement.  SNF referral sent via [place].   TOC staff will continue to assist with discharge needs.    Expected Discharge Plan: [place] Barriers to Discharge: Continued Medical Work up  Expected Discharge Plan and Services Expected Discharge Plan: [place]   Discharge Planning Services: CM Consult Post Acute Care Choice: [place] arrangements for the past 2 months: Single Family Home                                       Social Determinants of Health (SDOH) Interventions    Readmission Risk Interventions No flowsheet data found.

## 2020-01-30 NOTE — Plan of Care (Signed)
  Problem: Education: Goal: Verbalization of understanding the information provided will improve Outcome: Progressing   Problem: Coping: Goal: Level of anxiety will decrease Outcome: Progressing

## 2020-01-30 NOTE — Progress Notes (Signed)
Call from nurse to add order for COVID test for SNF. Still assessing whether she may need surgery for her right hip while she is inpatient, will hold off on discharge COVID test until this is decided

## 2020-01-30 NOTE — Evaluation (Signed)
Physical Therapy Evaluation Patient Details Name: Emily Horton MRN: [id number] DOB: 1946/07/30 Today's Date: 01/30/2020   History of Present Illness  Emily Horton is a 74 y.o. female who fell pushing garbage can on 01/27/20 and sustained right proximal humerus fracture. 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 No WB limits indicated in chart per ortho.  Clinical Impression  The  Patient is tearful about her recent fall. Patient lives alone and concurrs that a rehab stay is necessary. The patient presents with decreased balance and unsteady gait.   There are no notes to indicate any WB restrictions on the new " nonunion  Right femur fracture" from 7/20. Pt admitted with above diagnosis. Pt currently with functional limitations due to the deficits listed below (see PT Problem List). Pt will benefit from skilled PT to increase their independence and safety with mobility to allow discharge to the venue listed below.       Follow Up Recommendations SNF    Equipment Recommendations  None recommended by PT    Recommendations for Other Services OT consult     Precautions / Restrictions Precautions Precautions: Fall Required Braces or Orthoses: Sling Restrictions Weight Bearing Restrictions: Yes Other Position/Activity Restrictions: No indication for WB restrictions of non nunion femur fracture      Mobility  Bed Mobility Overal bed mobility: Needs Assistance Bed Mobility: Supine to Sit     Supine to sit: HOB elevated;Mod assist     General bed mobility comments: moving to the right Edge, required assist to pull to sitting. Attempted to the left edge but  patient felt that she would do best to the right.  Transfers Overall transfer level: Needs assistance Equipment used: 1 person hand held assist Transfers: Sit to/from Stand Sit to Stand: Min assist         General transfer comment: steady assist to  stand from the bed, wide base. Use of rail beside th toilet to sit down  Ambulation/Gait Ambulation/Gait assistance: Min assist Gait Distance (Feet): 20 Feet Assistive device: 1 person hand held assist Gait Pattern/deviations: Step-to pattern;Step-through pattern;Wide base of support Gait velocity: decr   General Gait Details: definite unsteady gait and requires HHA  Stairs            Wheelchair Mobility    Modified Rankin (Stroke Patients Only)       Balance Overall balance assessment: Needs assistance Sitting-balance support: No upper extremity supported Sitting balance-Leahy Scale: Fair     Standing balance support: During functional activity;No upper extremity supported Standing balance-Leahy Scale: Poor Standing balance comment: needs 1 UE for support at all times.                             Pertinent Vitals/Pain Pain Assessment: Faces Faces Pain Scale: Hurts a little bit Pain Location: starting to tingle right shoulder Pain Descriptors / Indicators: Throbbing;Tingling Pain Intervention(s): Monitored during session;Limited activity within patient's tolerance    Home Living Family/patient expects to be discharged to:: Private residence Living Arrangements: Alone   Type of Home: House Home Access: Stairs to enter Entrance Stairs-Rails: Psychiatric nurse of Steps: 3 Home Layout: Two level;Able to live on main level with bedroom/bathroom Home Equipment: Gilford Rile - 2 wheels;Cane - single point;Shower seat;Grab bars - tub/shower      Prior Function Level of Independence: Independent  Hand Dominance        Extremity/Trunk Assessment        Lower Extremity Assessment Lower Extremity Assessment: Generalized weakness    Cervical / Trunk Assessment Cervical / Trunk Assessment: Normal  Communication   Communication: No difficulties  Cognition Arousal/Alertness: Awake/alert Behavior During Therapy: WFL  for tasks assessed/performed Overall Cognitive Status: Within Functional Limits for tasks assessed                                 General Comments: emotonal about current status, " I feel like I am a burden on my famly"      General Comments      Exercises     Assessment/Plan    PT Assessment Patient needs continued PT services  PT Problem List Decreased strength;Decreased mobility;Decreased knowledge of precautions;Decreased activity tolerance;Decreased balance;Decreased knowledge of use of DME;Pain       PT Treatment Interventions DME instruction;Gait training;Functional mobility training;Balance training;Therapeutic exercise;Therapeutic activities;Patient/family education    PT Goals (Current goals can be found in the Care Plan section)  Acute Rehab PT Goals Patient Stated Goal: I guess I need to go to rehab PT Goal Formulation: With patient Time For Goal Achievement: 02/13/20 Potential to Achieve Goals: Good    Frequency Min 3X/week   Barriers to discharge Decreased caregiver support      Co-evaluation               AM-PAC PT "6 Clicks" Mobility  Outcome Measure Help needed turning from your back to your side while in a flat bed without using bedrails?: A Lot Help needed moving from lying on your back to sitting on the side of a flat bed without using bedrails?: A Lot Help needed moving to and from a bed to a chair (including a wheelchair)?: A Lot Help needed standing up from a chair using your arms (e.g., wheelchair or bedside chair)?: A Lot Help needed to walk in hospital room?: A Lot Help needed climbing 3-5 steps with a railing? : A Lot 6 Click Score: 12    End of Session   Activity Tolerance: Patient tolerated treatment well Patient left: (in [place] w/ OT) Nurse Communication: Mobility status PT Visit Diagnosis: Unsteadiness on feet (R26.81);Difficulty in walking, not elsewhere classified (R26.2);Repeated falls (R29.6)    Time:  [id number] PT Time Calculation (min) (ACUTE ONLY): 18 min   Charges:   PT Evaluation $PT Eval Low Complexity: [place] PT Acute Rehabilitation Services Pager [phone] Office [phone]   Claretha Cooper 01/30/2020, 9:39 AM

## 2020-01-30 NOTE — TOC Initial Note (Signed)
Transition of Care [facility]) - Initial/Assessment Note    Patient Details  Name: Emily Horton MRN: [id number] Date of Birth: 04/24/1946  Transition of Care [facility]) CM/SW Contact:    Lia Hopping, [place] Phone Number: 01/30/2020, 12:57 PM  Clinical Narrative:      Emily Horton is a 74 y.o. female who presents for preoperative history and physical with a diagnosis of right head splitting proximal humerus fracture. Symptoms are rated as moderate to severe, and have been worsening.  This is significantly impairing activities of daily living.  She has elected for surgical management. This occurred after a fall earlier a couple of days ago.               CSW met with the patient at bedside to discuss to SNF placement. Patient eating her lunch and agreeable to discuss. Patient reports she went to [facility] for rehab last year after surgery on her hip. She would prefer to return to [facility] if they have availability. Patient gave csw permission to send referrals to the SNF's in the area. Patient reports prior to falling she was independent with ADL's and started driving again about three months ago.   FL2 complete CSW will follow up with bed offers.    Expected Discharge Plan: [place] Barriers to Discharge: Continued Medical Work up   Patient Goals and CMS Choice Patient states their goals for this hospitalization and ongoing recovery are:: "I will work on this shoulder then I can figure out what to do with this hip." CMS Medicare.gov Compare Post Acute Care list provided to:: Patient Choice offered to / list presented to : Patient  Expected Discharge Plan and Services Expected Discharge Plan: [place]   Discharge Planning Services: CM Consult Post Acute Care Choice: [place] Living arrangements for the past 2 months: Single Family Home                                      Prior Living Arrangements/Services Living arrangements  for the past 2 months: Single Family Home Lives with:: Self Patient language and need for interpreter reviewed:: No Do you feel safe going back to the place where you live?: Yes      Need for Family Participation in Patient Care: Yes (Comment) Care giver support system in place?: Yes (comment) Current home services: DME Criminal Activity/Legal Involvement Pertinent to Current Situation/Hospitalization: No - Comment as needed  Activities of Daily Living Home Assistive Devices/Equipment: Built-in shower seat, Grab bars in shower ADL Screening (condition at time of admission) Patient's cognitive ability adequate to safely complete daily activities?: Yes Is the patient deaf or have difficulty hearing?: No Does the patient have difficulty seeing, even when wearing glasses/contacts?: No Does the patient have difficulty concentrating, remembering, or making decisions?: No Patient able to express need for assistance with ADLs?: Yes Does the patient have difficulty dressing or bathing?: No Independently performs ADLs?: Yes (appropriate for developmental age) Does the patient have difficulty walking or climbing stairs?: No Weakness of Legs: None Weakness of Arms/Hands: Right  Permission Sought/Granted Permission sought to share information with : Facility Sport and exercise psychologist, Family Supports Permission granted to share information with : Yes, Verbal Permission Granted     Permission granted to share info w AGENCY: [place] granted to share info w Relationship: Daughter and Son  Permission granted to  share info w Contact Information: Ackerman,Charlie  [phone]  Alger Memos [phone]  Emotional Assessment Appearance:: Appears stated age Attitude/Demeanor/Rapport: Engaged Affect (typically observed): Accepting, Pleasant Orientation: : Oriented to Self, Oriented to Place, Oriented to  Time, Oriented to Situation Alcohol / Substance Use: Not Applicable Psych Involvement:  No (comment)  Admission diagnosis:  Closed fracture of right proximal humerus [S42.201A] Patient Active Problem List   Diagnosis Date Noted  . Closed fracture of right proximal humerus 01/29/2020  . Closed right hip fracture, initial encounter ([place]) 06/22/2019  . Environmental and seasonal allergies 06/12/2018  . Dyslipidemia 08/26/2017  . IBS (irritable bowel syndrome) 08/26/2017  . Angio-edema 11/03/2016  . Diverticulosis of colon without hemorrhage 09/30/2015  . Depression 08/22/2007  . GERD 07/04/2007   PCP:  Laurey Morale, MD Pharmacy:   [facility] [place], [place] AT [facility] [address] [place] [address] Phone: [phone] Fax: [phone]     Social Determinants of Health (SDOH) Interventions    Readmission Risk Interventions No flowsheet data found.

## 2020-01-30 NOTE — Evaluation (Signed)
Occupational Therapy Evaluation Patient Details Name: Emily Horton MRN: [id number] DOB: Nov 17, 1946 Today's Date: 01/30/2020    History of Present Illness Emily Horton is a 74 y.o. female who fell pushing garbage can on 01/27/20 and sustained right proximal humerus fracture. 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 No WB limits indicated in chart per ortho.   Clinical Impression   Pt admitted s/p fall . Pt currently with functional limitations due to the deficits listed below (see OT Problem List).  Pt will benefit from skilled OT to increase their safety and independence with ADL and functional mobility for ADL to facilitate discharge to venue listed below.   Pt is R handed and broke R Humerus. Both of her children work and he has limited A. She lives alone and will likely need post acute rehab     Follow Up Recommendations  SNF    Equipment Recommendations  None recommended by OT       Precautions / Restrictions Precautions Precautions: Fall Required Braces or Orthoses: Sling Restrictions Weight Bearing Restrictions: Yes Other Position/Activity Restrictions: No indication for WB restrictions of non nunion femur fracture      Mobility Bed Mobility Overal bed mobility: Needs Assistance Bed Mobility: Supine to Sit     Supine to sit: HOB elevated;Mod assist     General bed mobility comments: pt OOB  Transfers Overall transfer level: Needs assistance Equipment used: 1 person hand held assist Transfers: Sit to/from Stand Sit to Stand: Min assist         General transfer comment: steady assist to stand from the bed, wide base. Use of rail beside th toilet to sit down    Balance Overall balance assessment: Needs assistance Sitting-balance support: No upper extremity supported Sitting balance-Leahy Scale: Fair     Standing balance support: During functional activity;No upper extremity  supported Standing balance-Leahy Scale: Poor Standing balance comment: needs 1 UE for support at all times.                           ADL either performed or assessed with clinical judgement   ADL Overall ADL's : Needs assistance/impaired Eating/Feeding: Set up;Sitting   Grooming: Minimal assistance;Standing   Upper Body Bathing: Moderate assistance;Sitting   Lower Body Bathing: Maximal assistance;Sit to/from stand;Cueing for compensatory techniques;Cueing for safety;Cueing for sequencing   Upper Body Dressing : Minimal assistance;Sitting   Lower Body Dressing: Maximal assistance;Sit to/from stand;Cueing for compensatory techniques;Cueing for safety;Cueing for sequencing   Toilet Transfer: Moderate assistance;Cueing for safety;Ambulation;Comfort height toilet   Toileting- Clothing Manipulation and Hygiene: Moderate assistance;Sit to/from stand;Cueing for safety;Cueing for compensatory techniques;Cueing for sequencing                            Pertinent Vitals/Pain Pain Assessment: Faces Pain Score: 3  Faces Pain Scale: Hurts a little bit Pain Location: starting to tingle right shoulder Pain Descriptors / Indicators: Throbbing;Tingling Pain Intervention(s): Monitored during session;Limited activity within patient's tolerance     Hand Dominance     Extremity/Trunk Assessment Upper Extremity Assessment Upper Extremity Assessment: RUE deficits/detail RUE Deficits / Details: No shoulder movement allowed.  Pt able to perform AAROM elbow , wrist and hand ROM as MD ordered   Lower Extremity Assessment Lower Extremity Assessment: Generalized weakness   Cervical / Trunk Assessment Cervical / Trunk Assessment: Normal  Communication Communication Communication: No difficulties   Cognition Arousal/Alertness: Awake/alert Behavior During Therapy: WFL for tasks assessed/performed Overall Cognitive Status: Within Functional Limits for tasks assessed                                  General Comments: emotonal about current status, " I feel like I am a burden on my famly"   General Comments       Exercises     Shoulder Instructions Shoulder Instructions Donning/doffing shirt without moving shoulder: Moderate assistance Method for sponge bathing under operated UE: Moderate assistance Donning/doffing sling/immobilizer: Moderate assistance Correct positioning of sling/immobilizer: Moderate assistance ROM for elbow, wrist and digits of operated UE: Moderate assistance Sling wearing schedule (on at all times/off for ADL's): Moderate assistance Proper positioning of operated UE when showering: Moderate assistance Positioning of UE while sleeping: Moderate assistance    Home Living Family/patient expects to be discharged to:: Private residence Living Arrangements: Alone   Type of Home: House Home Access: Stairs to enter [employer] of Steps: 3 Entrance Stairs-Rails: Right;Left Home Layout: Two level;Able to live on main level with bedroom/bathroom     Bathroom Shower/Tub: Walk-in shower         Home Equipment: Environmental consultant - 2 wheels;Cane - single point;Shower seat;Grab bars - tub/shower          Prior Functioning/Environment Level of Independence: Independent                 OT Problem List: Decreased strength;Decreased range of motion;Decreased activity tolerance;Impaired balance (sitting and/or standing);Decreased safety awareness;Decreased knowledge of use of DME or AE;Decreased knowledge of precautions      OT Treatment/Interventions: Self-care/ADL training;Patient/family education;Therapeutic exercise;DME and/or AE instruction;Therapeutic activities    OT Goals(Current goals can be found in the care plan section) Acute Rehab OT Goals Patient Stated Goal: I guess I need to go to rehab OT Goal Formulation: With patient Time For Goal Achievement: 02/05/20 ADL Goals Pt Will Perform Upper Body  Bathing: with supervision;standing Pt Will Perform Lower Body Bathing: with supervision;sit to/from stand Pt Will Perform Upper Body Dressing: with supervision;sitting Pt Will Perform Lower Body Dressing: with supervision;sit to/from stand Pt Will Transfer to Toilet: with supervision;regular height toilet;ambulating Pt Will Perform Toileting - Clothing Manipulation and hygiene: with supervision;sit to/from stand Pt/caregiver will Perform Home Exercise Program: With written HEP provided;Right Upper extremity  OT Frequency: Min 2X/week   Barriers to D/C: Decreased caregiver support             AM-PAC OT "6 Clicks" Daily Activity     Outcome Measure Help from another person eating meals?: A Little Help from another person taking care of personal grooming?: A Little Help from another person toileting, which includes using toliet, bedpan, or urinal?: A Lot Help from another person bathing (including washing, rinsing, drying)?: A Lot Help from another person to put on and taking off regular upper body clothing?: A Lot Help from another person to put on and taking off regular lower body clothing?: A Lot 6 Click Score: 14   End of Session Nurse Communication: Mobility status  Activity Tolerance: Patient tolerated treatment well Patient left: in chair;with call bell/phone within reach;with nursing/sitter in room;with chair alarm set  OT Visit Diagnosis: Unsteadiness on feet (R26.81);Other abnormalities of gait and mobility (R26.89);Muscle weakness (generalized) (M62.81);History of falling (Z91.81)  Time: 0910-0930 OT Time Calculation (min): 20 min Charges:  OT General Charges $OT Visit: 1 Visit OT Evaluation $OT Eval Moderate Complexity: 1 Mod  Kari Baars, OT Acute Rehabilitation Services [phone] Office- [phone]     Clanton Emanuelson, Edwena Felty D 01/30/2020, 12:01 PM

## 2020-01-30 NOTE — Progress Notes (Signed)
Subjective: 1 Day Post-Op s/p Procedure(s): RIGHT REVERSE SHOULDER ARTHROPLASTY   Patient is alert, oriented, laying comfortably in bed. Patient reports no pain, states her movement is starting to come back in right hand. Denies chest pain, SOB, Calf pain. No nausea/vomiting. No other complaints.  Objective:  PE: VITALS:   Vitals:   01/29/20 2104 01/29/20 2201 01/30/20 0241 01/30/20 0536  BP: 119/63 (!) 114/92 128/69 117/71  Pulse: 88 92  79  Resp: 18 18 18 18   Temp: 98.2 F (36.8 C) 98.2 F (36.8 C) 98.2 F (36.8 C) 98 F (36.7 C)  TempSrc: Oral Oral Oral Oral  SpO2: 95% 96% 95% 96%  Weight:      Height:       General: Laying comfortably, in bed, in not acute distress Abdomen: soft, non-tender MSK: Right arm in sling. Able to flex, extend, and abduct all fingers of right hand. Distal sensation intact. Intact radial pulse. Dressing C/D/I.   LABS  Results for orders placed or performed during the hospital encounter of 01/29/20 (from the past 24 hour(s))  CBC     Status: Abnormal   Collection Time: 01/30/20  4:22 AM  Result Value Ref Range   WBC 9.5 4.0 - 10.5 K/uL   RBC 2.92 (L) 3.87 - 5.11 MIL/uL   Hemoglobin 9.0 (L) 12.0 - 15.0 g/dL   HCT 28.8 (L) 36.0 - 46.0 %   MCV 98.6 80.0 - 100.0 fL   MCH 30.8 26.0 - 34.0 pg   MCHC 31.3 30.0 - 36.0 g/dL   RDW 13.2 11.5 - 15.5 %   Platelets 176 150 - 400 K/uL   nRBC 0.0 0.0 - 0.2 %  Basic metabolic panel     Status: Abnormal   Collection Time: 01/30/20  4:22 AM  Result Value Ref Range   Sodium 135 135 - 145 mmol/L   Potassium 4.9 3.5 - 5.1 mmol/L   Chloride 104 98 - 111 mmol/L   CO2 24 22 - 32 mmol/L   Glucose, Bld 115 (H) 70 - 99 mg/dL   BUN 23 8 - 23 mg/dL   Creatinine, Ser 0.83 0.44 - 1.00 mg/dL   Calcium 8.4 (L) 8.9 - 10.3 mg/dL   GFR calc non Af Amer >60 >60 mL/min   GFR calc Af Amer >60 >60 mL/min   Anion gap 7 5 - 15    CT SHOULDER RIGHT WO CONTRAST  Result Date: 01/28/2020 CLINICAL DATA:  [place] and  fractured right shoulder. EXAM: CT OF THE UPPER RIGHT EXTREMITY WITHOUT CONTRAST TECHNIQUE: Multidetector CT imaging of the upper right extremity was performed according to the standard protocol. COMPARISON:  Radiographs 01/27/2020 FINDINGS: Complex comminuted humeral head and neck fractures. The transverse fracture through the humeral neck demonstrates comminution and mild impaction. Maximum displacement is 10.5 mm. There is also a vertical fracture through the greater tuberosity which becomes a head split fracture superiorly splitting the humeral head. Maximal displacement is 14 mm. The glenoid is intact. No acute glenoid fracture. Calcification along the glenoid suggesting chondrocalcinosis. The [facility] joint is intact. Moderate degenerative changes with calcified pannus. The acromion is type 2 in shape. No significant lateral downsloping or undersurface spurring. Grossly by CT the rotator cuff tendons are intact. The humeroacromial space is normal. The visualized right ribs are intact and the visualized right lung is grossly clear. Fairly significant inflammation/edema/fluid/hematoma involving the right shoulder muscles and subcutaneous tissues. IMPRESSION: 1. Complex comminuted humeral head and neck fractures as described above.  Head split fracture component as detailed above. 2. No acute glenoid fracture. 3. Intact rotator cuff tendons. 4. Fairly significant inflammation/edema/fluid/hematoma involving the right shoulder muscles and subcutaneous tissues. Electronically Signed   By: Marijo Sanes M.D.   On: 01/28/2020 17:04   DG Shoulder Right Port  Result Date: 01/29/2020 CLINICAL DATA:  Fall fracture EXAM: PORTABLE RIGHT SHOULDER COMPARISON:  CT January 28, 2020 FINDINGS: The patient is status post reverse total shoulder arthroplasty. No new periprosthetic lucency seen. The comminuted proximal humerus shaft fracture is seen. No AC joint widening. Mild overlying soft tissue swelling. IMPRESSION: Status post  right shoulder total arthroplasty without acute hardware complication. Electronically Signed   By: Prudencio Pair M.D.   On: 01/29/2020 19:38    Assessment/Plan: Principal Problem:   Closed fracture of right proximal humerus    1 Day Post-Op s/p Procedure(s): RIGHT REVERSE SHOULDER ARTHROPLASTY  Weightbearing: Non-weight bearing through R arm. PT and OT consults placed. Insicional and dressing care: PRN dressing changes Orthopedic device(s): patient to remain in sling VTE prophylaxis: SCD's Pain control: continue current PRN pain control regimen, will discharge on oxycodone 5 Follow - up plan: Follow-up with Dr. 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 She also has a fair amount of instability when walking. Patient's family has indicated that they think it would be best for her to be discharged to a SNF. She is tearful today when speaking about discharge to a SNF as she states she spent 9 weeks at a SNF after surgery on her right hip. She is however amenable to that plan.  TOC team consult placed.  Contact information:   Weekdays 8-5 Merlene Pulling, [place] [phone] A fter hours and holidays please check [email or web address] for group call information for [facility]  Ventura Bruns 01/30/2020, 7:46 AM

## 2020-01-30 NOTE — NC FL2 (Signed)
[place] LEVEL OF CARE SCREENING TOOL     IDENTIFICATION  Patient Name: Emily Horton Birthdate: [birth year] Sex: female Admission Date (Current Location): 01/29/2020  [facility] and [place] Number:  Herbalist and Address:  [facility],  [place], [place]      Provider Number: [id number]  Attending Physician Name and Address:  Marchia Bond, MD  Relative Name and Phone Number:     Windy, Cos [phone]  [phone]  Alger Memos Daughter [phone]  [phone]       Current Level of Care: Hospital Recommended Level of Care: [place] Prior Approval Number:    Date Approved/Denied:   PASRR Number:   [id number]  Discharge Plan: Home    Current Diagnoses: Patient Active Problem List   Diagnosis Date Noted  . Closed fracture of right proximal humerus 01/29/2020  . Closed right hip fracture, initial encounter ([place]) 06/22/2019  . Environmental and seasonal allergies 06/12/2018  . Dyslipidemia 08/26/2017  . IBS (irritable bowel syndrome) 08/26/2017  . Angio-edema 11/03/2016  . Diverticulosis of colon without hemorrhage 09/30/2015  . Depression 08/22/2007  . GERD 07/04/2007    Orientation RESPIRATION BLADDER Height & Weight     Self, Time, Situation, Place  Normal Continent Weight: 197 lb (89.4 kg) Height:  5\' 4"  (162.6 cm)  BEHAVIORAL SYMPTOMS/MOOD NEUROLOGICAL BOWEL NUTRITION STATUS      Continent Diet(Regular)  AMBULATORY STATUS COMMUNICATION OF NEEDS Skin   Limited Assist Verbally Normal, Bruising                       Personal Care Assistance Level of Assistance  Bathing, Dressing, Feeding Bathing Assistance: Maximum assistance Feeding assistance: Independent Dressing Assistance: Maximum assistance     Functional Limitations Info  Sight, Hearing, Speech Sight Info: Adequate Hearing Info: Adequate Speech Info: Adequate    SPECIAL CARE FACTORS FREQUENCY  PT (By  licensed PT), OT (By licensed OT)     PT Frequency: 5x/week OT Frequency: 5x/week            Contractures Contractures Info: Not present    Additional Factors Info  Code Status, Allergies, Psychotropic Code Status Info: Fullcode Allergies Info: Allergies: Eggs Or Egg-derived Products, Zoloft Sertraline Hcl Psychotropic Info: Wellbutrin         Current Medications (01/30/2020):  This is the current hospital active medication list Current Facility-Administered Medications  Medication Dose Route Frequency Provider Last Rate Last Admin  . 0.45 % NaCl with KCl 20 mEq / L infusion   Intravenous Continuous Ventura Bruns, PA-C 75 mL/hr at 01/30/20 1116 New Bag at 01/30/20 1116  . acetaminophen (TYLENOL) tablet 325-650 mg  325-650 mg Oral Q6H PRN Merlene Pulling K, PA-C      . acetaminophen (TYLENOL) tablet 500 mg  500 mg Oral Q6H Merlene Pulling K, PA-C   500 mg at 01/30/20 0919  . alum & mag hydroxide-simeth (MAALOX/MYLANTA) 200-200-20 MG/5ML suspension 30 mL  30 mL Oral Q4H PRN Merlene Pulling K, PA-C      . bisacodyl (DULCOLAX) suppository 10 mg  10 mg Rectal Daily PRN Merlene Pulling K, PA-C      . buPROPion (WELLBUTRIN XL) 24 hr tablet 300 mg  300 mg Oral Daily Merlene Pulling K, PA-C   300 mg at 01/30/20 0919  . cholecalciferol (VITAMIN D) tablet 5,000 Units  5,000 Units Oral Daily Ventura Bruns, PA-C   5,000 Units at 01/30/20 0935  . diphenhydrAMINE (BENADRYL)  12.5 MG/5ML elixir 12.5-25 mg  12.5-25 mg Oral Q4H PRN Merlene Pulling K, PA-C      . docusate sodium (COLACE) capsule 100 mg  100 mg Oral BID Merlene Pulling K, PA-C   100 mg at 01/30/20 0919  . EPINEPHrine (EPI-PEN) injection 0.3 mg  0.3 mg Intramuscular Daily PRN Merlene Pulling K, PA-C      . HYDROcodone-acetaminophen (NORCO) 7.5-325 MG per tablet 1-2 tablet  1-2 tablet Oral Q4H PRN Merlene Pulling K, PA-C      . HYDROcodone-acetaminophen (NORCO/VICODIN) 5-325 MG per tablet 1-2 tablet  1-2 tablet Oral Q4H PRN Merlene Pulling K, PA-C       . loratadine (CLARITIN) tablet 10 mg  10 mg Oral Daily Merlene Pulling K, PA-C   10 mg at 01/30/20 0919  . magnesium citrate solution 1 Bottle  1 Bottle Oral Once PRN Merlene Pulling K, PA-C      . menthol-cetylpyridinium (CEPACOL) lozenge 3 mg  1 lozenge Oral PRN Merlene Pulling K, PA-C       Or  . phenol (CHLORASEPTIC) mouth spray 1 spray  1 spray Mouth/Throat PRN Merlene Pulling K, PA-C      . methocarbamol (ROBAXIN) tablet 500 mg  500 mg Oral Q6H PRN Merlene Pulling K, PA-C       Or  . methocarbamol (ROBAXIN) 500 mg in dextrose 5 % 50 mL IVPB  500 mg Intravenous Q6H PRN Owens Shark, Blaine K, PA-C      . metoCLOPramide (REGLAN) tablet 5-10 mg  5-10 mg Oral Q8H PRN Merlene Pulling K, PA-C       Or  . metoCLOPramide (REGLAN) injection 5-10 mg  5-10 mg Intravenous Q8H PRN Merlene Pulling K, PA-C      . morphine 2 MG/ML injection 0.5-1 mg  0.5-1 mg Intravenous Q2H PRN Merlene Pulling K, PA-C      . multivitamin with minerals tablet 1 tablet  1 tablet Oral Daily Merlene Pulling K, PA-C   1 tablet at 01/30/20 0919  . ondansetron (ZOFRAN) tablet 4 mg  4 mg Oral Q6H PRN Merlene Pulling K, PA-C       Or  . ondansetron [facility]) injection 4 mg  4 mg Intravenous Q6H PRN Merlene Pulling K, PA-C      . pantoprazole (PROTONIX) EC tablet 40 mg  40 mg Oral Daily Merlene Pulling K, PA-C   40 mg at 01/30/20 0919  . PARoxetine (PAXIL) tablet 10 mg  10 mg Oral Daily Merlene Pulling K, PA-C   10 mg at 01/30/20 0931  . polyethylene glycol (MIRALAX / GLYCOLAX) packet 17 g  17 g Oral Daily PRN Merlene Pulling K, PA-C      . zolpidem (AMBIEN) tablet 5 mg  5 mg Oral QHS PRN Ventura Bruns, PA-C         Discharge Medications: Please see discharge summary for a list of discharge medications.  Relevant Imaging Results:  Relevant Lab Results:   Additional Information SSN: [id number]  Lia Hopping, LCSW

## 2020-01-31 LAB — CBC
HCT: 29.1 % — ABNORMAL LOW (ref 36.0–46.0)
Hemoglobin: 9.2 g/dL — ABNORMAL LOW (ref 12.0–15.0)
MCH: 31 pg (ref 26.0–34.0)
MCHC: 31.6 g/dL (ref 30.0–36.0)
MCV: 98 fL (ref 80.0–100.0)
Platelets: 163 10*3/uL (ref 150–400)
RBC: 2.97 MIL/uL — ABNORMAL LOW (ref 3.87–5.11)
RDW: 13.2 % (ref 11.5–15.5)
WBC: 10.3 10*3/uL (ref 4.0–10.5)
nRBC: 0 % (ref 0.0–0.2)

## 2020-01-31 MED ORDER — ENSURE PRE-SURGERY PO LIQD
296.0000 mL | Freq: Once | ORAL | Status: DC
  Filled 2020-01-31 (×2): qty 296

## 2020-01-31 NOTE — Progress Notes (Signed)
Patient's right femoral neck fracture does not appear to be healing based on CAT scan and comparison from previous scan from December.  I have communicated with Dr. Marcelino Scot, and the patient has had multiple recurrent falls, leg length discrepancy, inability to ambulate because of right hip pain.  Plan is to convert her from an IM nail to a hip replacement, and I will likely involve Dr. Jean Rosenthal or one of our total joint subspecialist for this, and possibly surgery tomorrow if all works out.  I discussed the care plan with the patient as well as her daughter Leafy Ro.  We will follow with further details as the plan materializes.  N.p.o. after midnight.  Marchia Bond, MD

## 2020-01-31 NOTE — Telephone Encounter (Signed)
FYI Prolia has been approved by [employer].  There is no cost after patient's deductible is met.   Patient is aware.  Patient is currently in the hospital after a fall and is having surgery for her rotator cuff.

## 2020-01-31 NOTE — Progress Notes (Signed)
Subjective: 2 Days Post-Op s/p Procedure(s): RIGHT REVERSE SHOULDER ARTHROPLASTY   Patient is alert, oriented, laying comfortably in bed. Patient states pain was at a 6/10 but recently received pain medication and is now down to a 3/10. Denies chest pain, SOB, Calf pain. No nausea/vomiting. No other complaints.  Objective:  PE: VITALS:   Vitals:   01/30/20 1008 01/30/20 1314 01/30/20 2055 01/31/20 0534  BP: 105/68 121/60 (!) 135/104 133/74  Pulse: 98 86 91 86  Resp: 16 16 17 18   Temp: 98.4 F (36.9 C) (!) 97.5 F (36.4 C) 98.7 F (37.1 C) 98.1 F (36.7 C)  TempSrc: Oral Oral Oral Oral  SpO2: 97% 95% 95% 94%  Weight:      Height:        General: Laying comfortably, in bed, in not acute distress Abdomen: soft, non-tender MSK: Right arm in sling. Able to flex, extend, and abduct all fingers of right hand. Distal sensation intact. Intact radial pulse. Dressing C/D/I.   LABS  Results for orders placed or performed during the hospital encounter of 01/29/20 (from the past 24 hour(s))  CBC     Status: Abnormal   Collection Time: 01/31/20  3:05 AM  Result Value Ref Range   WBC 10.3 4.0 - 10.5 K/uL   RBC 2.97 (L) 3.87 - 5.11 MIL/uL   Hemoglobin 9.2 (L) 12.0 - 15.0 g/dL   HCT 29.1 (L) 36.0 - 46.0 %   MCV 98.0 80.0 - 100.0 fL   MCH 31.0 26.0 - 34.0 pg   MCHC 31.6 30.0 - 36.0 g/dL   RDW 13.2 11.5 - 15.5 %   Platelets 163 150 - 400 K/uL   nRBC 0.0 0.0 - 0.2 %    CT HIP RIGHT WO CONTRAST  Result Date: 01/30/2020 CLINICAL DATA:  Right hip pain since surgery 6 months ago. EXAM: CT OF THE RIGHT HIP WITHOUT CONTRAST TECHNIQUE: Multidetector CT imaging of the right hip was performed according to the standard protocol. Multiplanar CT image reconstructions were also generated. COMPARISON:  CT right hip 11/23/2019. Radiographs 06/22/2019. FINDINGS: Bones/Joint/Cartilage Status post dynamic compression screw plate fixation of the right femur with a long intramedullary nail, the  distal components of which are not imaged by this examination. The visualized hardware is intact. There is stable mild lucency surrounding the proximal aspect of the intramedullary nail. There has been no change in the alignment of the comminuted intertrochanteric right femur fracture. Small areas of bridging bone are demonstrated anteriorly and laterally, although many of the fracture lines are still visible, consistent with incomplete healing. No evidence of acute fracture, dislocation or femoral head avascular necrosis. The visualized inferior right hemipelvis is intact. No significant right hip arthropathy, although there is a possible small joint effusion. Ligaments Suboptimally assessed by CT. Muscles and Tendons Unremarkable. Soft tissues Stable postsurgical changes in the lateral subcutaneous fat without focal fluid collection. IMPRESSION: 1. Stable appearance of the comminuted intertrochanteric right femur fracture post ORIF. The fracture demonstrates incomplete healing but stable alignment. 2. No evidence of acute fracture, dislocation or femoral head avascular necrosis. Electronically Signed   By: Richardean Sale M.D.   On: 01/30/2020 20:22   DG Shoulder Right Port  Result Date: 01/29/2020 CLINICAL DATA:  Fall fracture EXAM: PORTABLE RIGHT SHOULDER COMPARISON:  CT January 28, 2020 FINDINGS: The patient is status post reverse total shoulder arthroplasty. No new periprosthetic lucency seen. The comminuted proximal humerus shaft fracture is seen. No AC joint widening. Mild overlying soft  tissue swelling. IMPRESSION: Status post right shoulder total arthroplasty without acute hardware complication. Electronically Signed   By: Prudencio Pair M.D.   On: 01/29/2020 19:38    Assessment/Plan: Principal Problem:   Closed fracture of right proximal humerus   2 Days Post-Op s/p Procedure(s): RIGHT REVERSE SHOULDER ARTHROPLASTY  Right hip pain after previous right femoral IM nail - CT of right hip  showing a stable appearance though not completely healed fracture - will discuss case with Dr. Marcelino Scot whether this should be converted to arthroplasty or we should continue with non-operative treatment  Weightbearing: Non-weight bearing through R arm. PT and OT consults placed. Insicional and dressing care: PRN dressing changes Orthopedic device(s): patient to remain in sling VTE prophylaxis: SCD's Pain control: continue current PRN pain control regimen, will discharge on oxycodone 5 Follow - up plan: Follow-up with Dr. Mardelle Matte in 2 weeks outpatient Discharge: to SNF when decision is made regarding hip  Contact information:   Weekdays 8-5 Merlene Pulling, PA-C [phone] A fter hours and holidays please check [email or web address] for group call information for [facility]  Ventura Bruns 01/31/2020, 7:20 AM

## 2020-01-31 NOTE — Progress Notes (Signed)
PHYSICAL THERAPY NOTE-  DUE TO NOTED RIGHT HIP NONUNION FRACTURE, pt IS ASKING FOR  ORTHO RECOMMENDATION FOR ANY WEIGHT BEARING RESTRICTIONS ON THE RLE. [place] Pager [phone] Office [phone] .

## 2020-01-31 NOTE — Plan of Care (Signed)

## 2020-01-31 NOTE — Progress Notes (Signed)
Patient ID: Emily Horton, female   DOB: 10/12/1946, 74 y.o.   MRN: [id number] I have seen and examined the patient and talked to her and her daughter at the bedside.  We talked about her nonunion of the right hip fracture.  At this point her recommendation is to remove the previous hardware and then to convert this to a total hip arthroplasty.  I explained in detail what this involves and we talked about the risk and benefits of surgery.  She understands this will be quite a difficult undertaking with hopefully the goal of weightbearing as tolerated after this.  We do plan to proceed with surgery tomorrow.  We will have her n.p.o. after midnight tonight.  All question concerns were answered and addressed.  I did examine her and she has pain with internal ex rotation of her right hip.  Her incisions at the hip and knee have healed nicely.  I also reviewed the CT scan and it does show a nonunion of her proximal femur fracture on the right side.

## 2020-01-31 NOTE — Progress Notes (Signed)
  Appreciate Drs. Mardelle Matte and Arnold. 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    Agree fully with plan to proceed with conversion to THA at this time. I have discussed these findings in detail with Mrs. Emily Horton daughter, Emily Horton.  Altamese Mentone, MD [facility], [facility] [phone]

## 2020-02-01 ENCOUNTER — Inpatient Hospital Stay (HOSPITAL_COMMUNITY): Payer: Medicare Other

## 2020-02-01 ENCOUNTER — Encounter (HOSPITAL_COMMUNITY): Payer: Self-pay | Admitting: Orthopedic Surgery

## 2020-02-01 ENCOUNTER — Encounter (HOSPITAL_COMMUNITY)
Admission: RE | Disposition: A | Discharge status: Home / Self Care | Payer: Self-pay | Source: Other Acute Inpatient Hospital | Attending: Orthopedic Surgery

## 2020-02-01 ENCOUNTER — Inpatient Hospital Stay (HOSPITAL_COMMUNITY): Payer: Medicare Other | Admitting: Certified Registered Nurse Anesthetist

## 2020-02-01 DIAGNOSIS — K5730 Diverticulosis of large intestine without perforation or abscess without bleeding: Secondary | ICD-10-CM | POA: Diagnosis not present

## 2020-02-01 DIAGNOSIS — K589 Irritable bowel syndrome without diarrhea: Secondary | ICD-10-CM | POA: Diagnosis not present

## 2020-02-01 DIAGNOSIS — S72141K Displaced intertrochanteric fracture of right femur, subsequent encounter for closed fracture with nonunion: Secondary | ICD-10-CM

## 2020-02-01 DIAGNOSIS — Z969 Presence of functional implant, unspecified: Secondary | ICD-10-CM

## 2020-02-01 DIAGNOSIS — S72001K Fracture of unspecified part of neck of right femur, subsequent encounter for closed fracture with nonunion: Secondary | ICD-10-CM | POA: Diagnosis not present

## 2020-02-01 DIAGNOSIS — E785 Hyperlipidemia, unspecified: Secondary | ICD-10-CM | POA: Diagnosis not present

## 2020-02-01 HISTORY — PX: TOTAL HIP ARTHROPLASTY: SHX124

## 2020-02-01 HISTORY — PX: HARDWARE REMOVAL: SHX979

## 2020-02-01 LAB — HEMOGLOBIN AND HEMATOCRIT, BLOOD
HCT: 29.1 % — ABNORMAL LOW (ref 36.0–46.0)
Hemoglobin: 9.3 g/dL — ABNORMAL LOW (ref 12.0–15.0)

## 2020-02-01 LAB — GLUCOSE, CAPILLARY: Glucose-Capillary: 130 mg/dL — ABNORMAL HIGH (ref 70–99)

## 2020-02-01 SURGERY — ARTHROPLASTY, HIP, TOTAL, ANTERIOR APPROACH
Anesthesia: General | Site: Hip | Laterality: Right

## 2020-02-01 MED ORDER — CHLORHEXIDINE GLUCONATE CLOTH 2 % EX PADS
6.0000 | MEDICATED_PAD | Freq: Every day | CUTANEOUS | Status: DC
  Administered 2020-02-01 – 2020-02-02 (×2): 6 via TOPICAL

## 2020-02-01 MED ORDER — FENTANYL CITRATE (PF) 100 MCG/2ML IJ SOLN
INTRAMUSCULAR | Status: AC
  Filled 2020-02-01: qty 2

## 2020-02-01 MED ORDER — SODIUM CHLORIDE 0.9 % IR SOLN
Status: DC | PRN
  Administered 2020-02-01: 1000 mL

## 2020-02-01 MED ORDER — ROCURONIUM BROMIDE 10 MG/ML (PF) SYRINGE
PREFILLED_SYRINGE | INTRAVENOUS | Status: AC
  Filled 2020-02-01: qty 20

## 2020-02-01 MED ORDER — SUGAMMADEX SODIUM 200 MG/2ML IV SOLN
INTRAVENOUS | Status: DC | PRN
  Administered 2020-02-01: 200 mg via INTRAVENOUS

## 2020-02-01 MED ORDER — STERILE WATER FOR IRRIGATION IR SOLN
Status: DC | PRN
  Administered 2020-02-01: 2000 mL

## 2020-02-01 MED ORDER — LIDOCAINE 2% (20 MG/ML) 5 ML SYRINGE
INTRAMUSCULAR | Status: DC | PRN
  Administered 2020-02-01: 60 mg via INTRAVENOUS

## 2020-02-01 MED ORDER — ACETAMINOPHEN 10 MG/ML IV SOLN: 1000.0000 mg | Freq: Once | INTRAVENOUS | Status: DC | PRN

## 2020-02-01 MED ORDER — MIDAZOLAM HCL 5 MG/5ML IJ SOLN
INTRAMUSCULAR | Status: DC | PRN
  Administered 2020-02-01: 2 mg via INTRAVENOUS

## 2020-02-01 MED ORDER — PROPOFOL 10 MG/ML IV BOLUS
INTRAVENOUS | Status: DC | PRN
  Administered 2020-02-01: 170 mg via INTRAVENOUS

## 2020-02-01 MED ORDER — MENTHOL 3 MG MT LOZG: 1.0000 | LOZENGE | OROMUCOSAL | Status: DC | PRN

## 2020-02-01 MED ORDER — PROMETHAZINE HCL 25 MG/ML IJ SOLN: 6.2500 mg | INTRAMUSCULAR | Status: DC | PRN

## 2020-02-01 MED ORDER — CEFAZOLIN SODIUM-DEXTROSE 2-4 GM/100ML-% IV SOLN
2.0000 g | INTRAVENOUS | Status: AC
  Administered 2020-02-01: 2 g via INTRAVENOUS
  Filled 2020-02-01: qty 100

## 2020-02-01 MED ORDER — KETAMINE HCL 10 MG/ML IJ SOLN
INTRAMUSCULAR | Status: DC | PRN
  Administered 2020-02-01 (×2): 40 mg via INTRAVENOUS
  Administered 2020-02-01: 20 mg via INTRAVENOUS

## 2020-02-01 MED ORDER — HYDROMORPHONE HCL 1 MG/ML IJ SOLN
0.5000 mg | INTRAMUSCULAR | Status: DC | PRN
  Administered 2020-02-01 – 2020-02-02 (×2): 1 mg via INTRAVENOUS
  Filled 2020-02-01 (×2): qty 1

## 2020-02-01 MED ORDER — KETAMINE HCL 10 MG/ML IJ SOLN
INTRAMUSCULAR | Status: AC
  Filled 2020-02-01: qty 1

## 2020-02-01 MED ORDER — ALBUMIN HUMAN 5 % IV SOLN
INTRAVENOUS | Status: AC
  Administered 2020-02-01: 16:00:00 12.5 g via INTRAVENOUS
  Filled 2020-02-01: qty 250

## 2020-02-01 MED ORDER — ACETAMINOPHEN 325 MG PO TABS
325.0000 mg | ORAL_TABLET | Freq: Four times a day (QID) | ORAL | Status: DC | PRN
  Administered 2020-02-03: 13:00:00 650 mg via ORAL
  Filled 2020-02-01: qty 2

## 2020-02-01 MED ORDER — DEXAMETHASONE SODIUM PHOSPHATE 10 MG/ML IJ SOLN
INTRAMUSCULAR | Status: DC | PRN
  Administered 2020-02-01: 10 mg via INTRAVENOUS

## 2020-02-01 MED ORDER — METHOCARBAMOL 500 MG IVPB - SIMPLE MED: 500.0000 mg | Freq: Four times a day (QID) | INTRAVENOUS | Status: DC | PRN

## 2020-02-01 MED ORDER — LACTATED RINGERS IV SOLN: INTRAVENOUS | Status: DC

## 2020-02-01 MED ORDER — PANTOPRAZOLE SODIUM 40 MG PO TBEC
40.0000 mg | DELAYED_RELEASE_TABLET | Freq: Every day | ORAL | Status: DC
  Administered 2020-02-02 – 2020-02-06 (×5): 40 mg via ORAL
  Filled 2020-02-01 (×5): qty 1

## 2020-02-01 MED ORDER — ALUM & MAG HYDROXIDE-SIMETH 200-200-20 MG/5ML PO SUSP: 30.0000 mL | ORAL | Status: DC | PRN

## 2020-02-01 MED ORDER — OXYCODONE HCL 5 MG PO TABS
10.0000 mg | ORAL_TABLET | ORAL | Status: DC | PRN
  Administered 2020-02-03 (×2): 10 mg via ORAL
  Administered 2020-02-03: 15 mg via ORAL
  Administered 2020-02-04: 10 mg via ORAL
  Administered 2020-02-04: 15 mg via ORAL
  Administered 2020-02-04: 10 mg via ORAL
  Administered 2020-02-05: 15 mg via ORAL
  Administered 2020-02-05: 10 mg via ORAL
  Administered 2020-02-05: 15 mg via ORAL
  Filled 2020-02-01: qty 2
  Filled 2020-02-01 (×4): qty 3
  Filled 2020-02-01 (×2): qty 2

## 2020-02-01 MED ORDER — PHENYLEPHRINE HCL-NACL 10-0.9 MG/250ML-% IV SOLN
0.0000 ug/min | INTRAVENOUS | Status: DC
  Administered 2020-02-01: 15:00:00 100 ug/min via INTRAVENOUS

## 2020-02-01 MED ORDER — MIDAZOLAM HCL 2 MG/2ML IJ SOLN
INTRAMUSCULAR | Status: AC
  Filled 2020-02-01: qty 2

## 2020-02-01 MED ORDER — METHOCARBAMOL 500 MG PO TABS: 500.0000 mg | ORAL_TABLET | Freq: Four times a day (QID) | ORAL | Status: DC | PRN

## 2020-02-01 MED ORDER — POVIDONE-IODINE 10 % EX SWAB
2.0000 "application " | Freq: Once | CUTANEOUS | Status: AC
  Administered 2020-02-01: 2 via TOPICAL

## 2020-02-01 MED ORDER — ASPIRIN 81 MG PO CHEW
81.0000 mg | CHEWABLE_TABLET | Freq: Two times a day (BID) | ORAL | Status: DC
  Administered 2020-02-01 – 2020-02-06 (×10): 81 mg via ORAL
  Filled 2020-02-01 (×11): qty 1

## 2020-02-01 MED ORDER — HYDROMORPHONE HCL 1 MG/ML IJ SOLN
INTRAMUSCULAR | Status: AC
  Administered 2020-02-01: 16:00:00 0.25 mg via INTRAVENOUS
  Filled 2020-02-01: qty 1

## 2020-02-01 MED ORDER — HYDROMORPHONE HCL 1 MG/ML IJ SOLN
0.2500 mg | INTRAMUSCULAR | Status: DC | PRN
  Administered 2020-02-01: 0.25 mg via INTRAVENOUS

## 2020-02-01 MED ORDER — MEPERIDINE HCL 50 MG/ML IJ SOLN: 6.2500 mg | INTRAMUSCULAR | Status: DC | PRN

## 2020-02-01 MED ORDER — ALBUMIN HUMAN 5 % IV SOLN
INTRAVENOUS | Status: AC
  Administered 2020-02-01: 15:00:00 12.5 g via INTRAVENOUS
  Filled 2020-02-01: qty 250

## 2020-02-01 MED ORDER — DOCUSATE SODIUM 100 MG PO CAPS
100.0000 mg | ORAL_CAPSULE | Freq: Two times a day (BID) | ORAL | Status: DC
Start: 2020-02-01 — End: 2020-02-06
  Administered 2020-02-01 – 2020-02-06 (×10): 100 mg via ORAL
  Filled 2020-02-01 (×10): qty 1

## 2020-02-01 MED ORDER — PHENOL 1.4 % MT LIQD
1.0000 | OROMUCOSAL | Status: DC | PRN
  Filled 2020-02-01: qty 177

## 2020-02-01 MED ORDER — SODIUM CHLORIDE 0.9 % IV SOLN: INTRAVENOUS | Status: DC

## 2020-02-01 MED ORDER — CHLORHEXIDINE GLUCONATE 4 % EX LIQD
60.0000 mL | Freq: Once | CUTANEOUS | Status: AC
  Administered 2020-02-01: 4 via TOPICAL

## 2020-02-01 MED ORDER — OXYCODONE HCL 5 MG PO TABS
5.0000 mg | ORAL_TABLET | ORAL | Status: DC | PRN
  Administered 2020-02-01: 5 mg via ORAL
  Administered 2020-02-02 – 2020-02-04 (×6): 10 mg via ORAL
  Administered 2020-02-05 – 2020-02-06 (×3): 5 mg via ORAL
  Administered 2020-02-06 (×2): 10 mg via ORAL
  Filled 2020-02-01: qty 2
  Filled 2020-02-01: qty 1
  Filled 2020-02-01 (×7): qty 2
  Filled 2020-02-01: qty 1
  Filled 2020-02-01 (×3): qty 2
  Filled 2020-02-01: qty 1
  Filled 2020-02-01: qty 2

## 2020-02-01 MED ORDER — ALBUMIN HUMAN 5 % IV SOLN: 12.5000 g | Freq: Once | INTRAVENOUS | Status: AC

## 2020-02-01 MED ORDER — EPHEDRINE SULFATE-NACL 50-0.9 MG/10ML-% IV SOSY: PREFILLED_SYRINGE | INTRAVENOUS | Status: DC | PRN

## 2020-02-01 MED ORDER — CEFAZOLIN SODIUM-DEXTROSE 1-4 GM/50ML-% IV SOLN
1.0000 g | Freq: Four times a day (QID) | INTRAVENOUS | Status: AC
  Administered 2020-02-01 (×2): 1 g via INTRAVENOUS
  Filled 2020-02-01 (×2): qty 50

## 2020-02-01 MED ORDER — DIPHENHYDRAMINE HCL 12.5 MG/5ML PO ELIX
12.5000 mg | ORAL_SOLUTION | ORAL | Status: DC | PRN
  Administered 2020-02-03: 12.5 mg via ORAL
  Filled 2020-02-01: qty 5

## 2020-02-01 MED ORDER — ROCURONIUM BROMIDE 10 MG/ML (PF) SYRINGE
PREFILLED_SYRINGE | INTRAVENOUS | Status: DC | PRN
  Administered 2020-02-01: 10 mg via INTRAVENOUS
  Administered 2020-02-01: 60 mg via INTRAVENOUS
  Administered 2020-02-01 (×3): 20 mg via INTRAVENOUS

## 2020-02-01 MED ORDER — METOCLOPRAMIDE HCL 5 MG PO TABS: 5.0000 mg | ORAL_TABLET | Freq: Three times a day (TID) | ORAL | Status: DC | PRN

## 2020-02-01 MED ORDER — METOCLOPRAMIDE HCL 5 MG/ML IJ SOLN: 5.0000 mg | Freq: Three times a day (TID) | INTRAMUSCULAR | Status: DC | PRN

## 2020-02-01 MED ORDER — PHENYLEPHRINE 40 MCG/ML (10ML) SYRINGE FOR IV PUSH (FOR BLOOD PRESSURE SUPPORT)
PREFILLED_SYRINGE | INTRAVENOUS | Status: DC | PRN
  Administered 2020-02-01 (×5): 80 ug via INTRAVENOUS

## 2020-02-01 MED ORDER — FENTANYL CITRATE (PF) 100 MCG/2ML IJ SOLN
INTRAMUSCULAR | Status: DC | PRN
  Administered 2020-02-01 (×4): 50 ug via INTRAVENOUS

## 2020-02-01 MED ORDER — 0.9 % SODIUM CHLORIDE (POUR BTL) OPTIME
TOPICAL | Status: DC | PRN
  Administered 2020-02-01: 1000 mL

## 2020-02-01 MED ORDER — ONDANSETRON HCL 4 MG/2ML IJ SOLN
4.0000 mg | Freq: Four times a day (QID) | INTRAMUSCULAR | Status: DC | PRN
  Administered 2020-02-02: 4 mg via INTRAVENOUS
  Filled 2020-02-01: qty 2

## 2020-02-01 MED ORDER — ONDANSETRON HCL 4 MG PO TABS
4.0000 mg | ORAL_TABLET | Freq: Four times a day (QID) | ORAL | Status: DC | PRN
  Filled 2020-02-01: qty 1

## 2020-02-01 SURGICAL SUPPLY — 61 items
APL SKNCLS STERI-STRIP NONHPOA (GAUZE/BANDAGES/DRESSINGS)
BAG SPEC THK2 15X12 ZIP CLS (MISCELLANEOUS) ×1
BAG ZIPLOCK 12X15 (MISCELLANEOUS) ×2 IMPLANT
BANDAGE ESMARK 6X9 LF (GAUZE/BANDAGES/DRESSINGS) ×1 IMPLANT
BENZOIN TINCTURE PRP APPL 2/3 (GAUZE/BANDAGES/DRESSINGS) IMPLANT
BLADE SAW SGTL 18X1.27X75 (BLADE) ×2 IMPLANT
BNDG CMPR 9X6 STRL LF SNTH (GAUZE/BANDAGES/DRESSINGS) ×1
BNDG ELASTIC 6X5.8 VLCR STR LF (GAUZE/BANDAGES/DRESSINGS) ×2 IMPLANT
BNDG ESMARK 6X9 LF (GAUZE/BANDAGES/DRESSINGS) ×2
COVER PERINEAL POST (MISCELLANEOUS) ×2 IMPLANT
COVER SURGICAL LIGHT HANDLE (MISCELLANEOUS) ×2 IMPLANT
COVER WAND RF STERILE (DRAPES) ×2 IMPLANT
CUP SECTOR GRIPTON 50MM (Cup) ×1 IMPLANT
DRAPE POUCH INSTRU U-SHP 10X18 (DRAPES) ×2 IMPLANT
DRAPE STERI IOBAN 125X83 (DRAPES) ×2 IMPLANT
DRAPE U-SHAPE 47X51 STRL (DRAPES) ×4 IMPLANT
DRESSING AQUACEL AG SP 3.5X4 (GAUZE/BANDAGES/DRESSINGS) IMPLANT
DRSG AQUACEL AG ADV 3.5X10 (GAUZE/BANDAGES/DRESSINGS) ×2 IMPLANT
DRSG AQUACEL AG ADV 3.5X14 (GAUZE/BANDAGES/DRESSINGS) ×1 IMPLANT
DRSG AQUACEL AG SP 3.5X4 (GAUZE/BANDAGES/DRESSINGS) ×2
DRSG PAD ABDOMINAL 8X10 ST (GAUZE/BANDAGES/DRESSINGS) ×2 IMPLANT
DURAPREP 26ML APPLICATOR (WOUND CARE) ×2 IMPLANT
ELECT REM PT RETURN 15FT ADLT (MISCELLANEOUS) ×2 IMPLANT
GAUZE SPONGE 4X4 12PLY STRL (GAUZE/BANDAGES/DRESSINGS) ×2 IMPLANT
GAUZE XEROFORM 1X8 LF (GAUZE/BANDAGES/DRESSINGS) ×1 IMPLANT
GAUZE XEROFORM 5X9 LF (GAUZE/BANDAGES/DRESSINGS) ×1 IMPLANT
GLOVE BIO SURGEON STRL SZ7.5 (GLOVE) ×2 IMPLANT
GLOVE BIOGEL PI IND STRL 8 (GLOVE) ×2 IMPLANT
GLOVE BIOGEL PI INDICATOR 8 (GLOVE) ×2
GLOVE ECLIPSE 8.0 STRL XLNG CF (GLOVE) ×2 IMPLANT
GOWN STRL REUS W/TWL XL LVL3 (GOWN DISPOSABLE) ×4 IMPLANT
HANDPIECE INTERPULSE COAX TIP (DISPOSABLE) ×2
HEAD FEM STD 32X+1 STRL (Hips) ×1 IMPLANT
HOLDER FOLEY CATH W/STRAP (MISCELLANEOUS) ×2 IMPLANT
KIT BASIN OR (CUSTOM PROCEDURE TRAY) ×2 IMPLANT
KIT TURNOVER KIT A (KITS) IMPLANT
LINER ACETABULAR 32X50 (Liner) ×1 IMPLANT
NS IRRIG 1000ML POUR BTL (IV SOLUTION) ×2 IMPLANT
PACK ANTERIOR HIP CUSTOM (KITS) ×2 IMPLANT
PENCIL SMOKE EVACUATOR (MISCELLANEOUS) ×1 IMPLANT
PROTECTOR NERVE ULNAR (MISCELLANEOUS) ×2 IMPLANT
SCREW 6.5MMX25MM (Screw) ×1 IMPLANT
SCREW 6.5MMX30MM (Screw) ×1 IMPLANT
SCREW PINN CAN 6.5X20 (Screw) ×1 IMPLANT
SET HNDPC FAN SPRY TIP SCT (DISPOSABLE) ×1 IMPLANT
SPONGE LAP 18X18 RF (DISPOSABLE) ×3 IMPLANT
STAPLER VISISTAT 35W (STAPLE) ×2 IMPLANT
STEM CORAIL KA12 (Stem) ×1 IMPLANT
STRIP CLOSURE SKIN 1/2X4 (GAUZE/BANDAGES/DRESSINGS) IMPLANT
SUT ETHIBOND NAB CT1 #1 30IN (SUTURE) ×2 IMPLANT
SUT ETHILON 2 0 PS N (SUTURE) IMPLANT
SUT MNCRL AB 4-0 PS2 18 (SUTURE) IMPLANT
SUT VIC AB 0 CT1 36 (SUTURE) ×4 IMPLANT
SUT VIC AB 1 CT1 27 (SUTURE) ×2
SUT VIC AB 1 CT1 27XBRD ANTBC (SUTURE) ×1 IMPLANT
SUT VIC AB 1 CT1 36 (SUTURE) ×2 IMPLANT
SUT VIC AB 2-0 CT1 27 (SUTURE) ×4
SUT VIC AB 2-0 CT1 TAPERPNT 27 (SUTURE) ×2 IMPLANT
TOWEL OR 17X26 10 PK STRL BLUE (TOWEL DISPOSABLE) ×4 IMPLANT
TRAY FOLEY MTR SLVR 14FR STAT (SET/KITS/TRAYS/PACK) ×1 IMPLANT
YANKAUER SUCT BULB TIP 10FT TU (MISCELLANEOUS) ×2 IMPLANT

## 2020-02-01 NOTE — Progress Notes (Signed)
PT Cancellation Note  Patient Details Name: AREIAL BOYSEN MRN: [id number] DOB: [birth year]   Cancelled Treatment:    Reason Eval/Treat Not Completed: Medical issues which prohibited therapy, Planned surgery for THA. Will resume after post op orders received.   Claretha Cooper 02/01/2020, 7:00 AM  [place] Pager [phone] Office [phone]

## 2020-02-01 NOTE — Brief Op Note (Signed)
01/29/2020 - 02/01/2020  2:07 PM  PATIENT:  Emily Horton  73 y.o. female  PRE-OPERATIVE DIAGNOSIS:  RIGHT FEMORAL NECK NONUNION  POST-OPERATIVE DIAGNOSIS:  RIGHT FEMORAL NECK NONUNION  PROCEDURE:  Procedure(s): TOTAL HIP ARTHROPLASTY ANTERIOR APPROACH (Right) HARDWARE REMOVAL (Right)  SURGEON:  Surgeon(s) and Role:    Mcarthur Rossetti, MD - Primary  PHYSICIAN ASSISTANT: Benita Stabile, PA-C  ANESTHESIA:   general  EBL:  550 mL   COUNTS:  YES  DICTATION: .Other Dictation: Dictation Number [id number]  PLAN OF CARE: Admit to inpatient   PATIENT DISPOSITION:  PACU - hemodynamically stable.   Delay start of Pharmacological VTE agent (>24hrs) due to surgical blood loss or risk of bleeding: no

## 2020-02-01 NOTE — Transfer of Care (Signed)
Immediate Anesthesia Transfer of Care Note  Patient: Emily Horton  Procedure(s) Performed: TOTAL HIP ARTHROPLASTY ANTERIOR APPROACH (Right Hip) HARDWARE REMOVAL (Right Hip)  Patient Location: PACU  Anesthesia Type:General  Level of Consciousness: drowsy and patient cooperative  Airway & Oxygen Therapy: Patient Spontanous Breathing and Patient connected to face mask oxygen  Post-op Assessment: Report given to RN, Post -op Vital signs reviewed and stable and Patient moving all extremities  Post vital signs: Reviewed and stable  Last Vitals:  Vitals Value Taken Time  BP 95/54 02/01/20 1448  Temp    Pulse 96 02/01/20 1449  Resp 18 02/01/20 1450  SpO2 96 % 02/01/20 1449  Vitals shown include unvalidated device data.  Last Pain:  Vitals:   02/01/20 0905  TempSrc: Oral  PainSc:       Patients Stated Pain Goal: 0 ([id number])  Complications: No apparent anesthesia complications

## 2020-02-01 NOTE — TOC Progression Note (Signed)
Transition of Care [facility]) - Progression Note    Patient Details  Name: Emily Horton MRN: [id number] Date of Birth: [birth year]  Transition of Care [facility]) CM/SW [place], LCSW Phone Number: 02/01/2020, 10:26 AM  Clinical Narrative:    CSW reached out to the patient daughter to follow up on placement for [facility]. Patient daughter is unsure if the patient will d/c there because it is private pay.  CSW provided list of additional bed offers.  TOC staff will continue to follow this patient.    Expected Discharge Plan: [place] Barriers to Discharge: Continued Medical Work up  Expected Discharge Plan and Services Expected Discharge Plan: [place]   Discharge Planning Services: CM Consult Post Acute Care Choice: [place] Living arrangements for the past 2 months: Single Family Home                                       Social Determinants of Health (SDOH) Interventions    Readmission Risk Interventions No flowsheet data found.

## 2020-02-01 NOTE — Progress Notes (Signed)
RN unavailable for report, pt ready to return to room.  This RN informed floor pt will be returned to preexisting room and RN will wait there to give report.

## 2020-02-01 NOTE — Plan of Care (Signed)
  Problem: Activity: Goal: Ability to avoid complications of mobility impairment will improve Outcome: Progressing Goal: Ability to tolerate increased activity will improve Outcome: Progressing   Problem: Education: Goal: Verbalization of understanding the information provided will improve Outcome: Progressing   Problem: Coping: Goal: Level of anxiety will decrease Outcome: Progressing   Problem: Respiratory: Goal: Ability to maintain a clear airway will improve Outcome: Progressing   Problem: Pain Management: Goal: Pain level will decrease Outcome: Progressing   Problem: Skin Integrity: Goal: Signs of wound healing will improve Outcome: Progressing   Problem: Tissue Perfusion: Goal: Ability to maintain adequate tissue perfusion will improve Outcome: Progressing

## 2020-02-01 NOTE — Anesthesia Procedure Notes (Signed)
Procedure Name: Intubation Date/Time: 02/01/2020 10:39 AM Performed by: Gerald Leitz, CRNA Pre-anesthesia Checklist: Patient identified, Patient being monitored, Timeout performed, Emergency Drugs available and Suction available Patient Re-evaluated:Patient Re-evaluated prior to induction Oxygen Delivery Method: Circle system utilized Preoxygenation: Pre-oxygenation with 100% oxygen Induction Type: IV induction Ventilation: Mask ventilation without difficulty Laryngoscope Size: Mac and 3 Grade View: Grade I Tube type: Oral Tube size: 7.0 mm Number of attempts: 1 Placement Confirmation: ETT inserted through vocal cords under direct vision,  positive ETCO2 and breath sounds checked- equal and bilateral Secured at: 21 cm Tube secured with: Tape Dental Injury: Teeth and Oropharynx as per pre-operative assessment

## 2020-02-01 NOTE — Op Note (Signed)
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  PROCEDURES: 1.  Removal of retained intramedullary rod and hip screw from right femur. 2.  Right total hip arthroplasty through direct anterior approach.  IMPLANTS:  DePuy Sector Gription acetabular component size 50 with 2 screws, size 32+0 neutral polyethylene liner, size 12 Corail femoral component with standard offset, size 32+1 metal hip ball.  SURGEON:  Lind Guest.  Ninfa Linden, MD  ASSISTANT:  Erskine Emery, PA-C  ANESTHESIA:  General.  ANTIBIOTICS:  Two g IV Ancef.  ESTIMATED BLOOD LOSS:  500 mL.  COMPLICATIONS:  None.  INDICATIONS:  The patient is a very pleasant 74 year old female who in July of last year  unfortunately sustained a hard mechanical fall, fracturing her right hip.  This was a complex intertrochanteric/basicervical femoral neck and intertrochanteric  fracture combination.  She went under appropriate fixation with a long intramedullary rod and a hip screw and 2 distal interlocking screws.  She went on to compress her fracture and did break one the distal interlocking screws, but continued to  complain  of right hip pain.  She is a smoker as well.  She was then taken to the operating room this week on Tuesday for an elective right total shoulder arthroplasty done by one of my colleagues in town.  After surgery, she did very well with her shoulder and  was very pleased, but she is still complaining of severe right hip pain.  While she was in the hospital, my colleague did obtain a CT scan of her pelvis and right hip.  It does show a nonunion of the fracture.  He then  contacted me and collectively we  decided to keep her in the hospital and perform hardware removal from her right hip with then converting to a total hip arthroplasty.  I had a long and thorough discussion with her and told her how difficult the surgery would certainly be given the fact  that hardware removal is never a satisfying type of surgery and can take a conservative amount of time.  She also understands with the quality of her bone, a lot of the fixation that we try as far as the components will depend on what our intraoperative  findings are.  I had a long and thorough discussion with the patient and her daughter about this and given the debility of her right hip, she does wish to proceed with surgery today.  I talked to the primary admitting orthopedic surgeon who is a  colleague of mine and I also talked to the orthopedic trauma specialist who did her original hip surgery and we collectively felt that this was appropriate to keep her in the hospital and proceed with the surgery today.  DESCRIPTION OF PROCEDURE:  After informed consent was obtained and appropriate right lower extremity thigh and knee were marked, she was brought to the operating room.  General anesthesia was obtained while she was on a stretcher.  Foley catheter was  placed.  I assessed her leg lengths and found her to be significantly short on the right operative extremity.  We then placed traction boots on both her feet and placed her supine on the Hana fracture table, the perineal post in place and both legs  in  line skeletal traction device and no traction applied.  We then performed a wide prep from the lower abdominal area to past the knee so we could encompass all the previous incisions from her other surgery and be able to proceed with a hip replacement  through an anterior approach.  Timeout was called to identify correct patient, correct right hip and lower extremity.  We then went first with the hardware removal.   First, we made actually a distal incision over the 2 distal interlocking screws.  We  were able to remove the screws easily.  Obviously, the more proximal interlocking screws had broken and the medial side of the screw was buried within the bone and we did not go after that knowing that we did not need to.  Next, I made 2 separate lateral  incision up at the proximal femur where the rod was inserted, as well as an incision over where the lag screw into the hip was placed.  It took [place] a while to dissect this out, but we were able to finally get down to the rod  and  then removed backed out  the set screw.  We then removed the lag screw without difficulty and then backed the rod out.  This portion of the case was quite extensive, but her blood loss was less than 100, so we felt it was appropriate to proceed with the hip replacement part of  the case.  Also, given the fact this is a fracture nonunion, we would not be comfortable allowing her to weightbear.  After those 3 incisions to take out the rod and hardware, we took a standard anterior approach to the hip through an anterior  Smith-Petersen almost type of approach to the hip.  We made our incision just inferior and posterior to the anterior superior iliac spine and carried this slightly obliquely down the leg.  We dissected down to the tensor fascia lata muscle and the tensor  fascia was then divided longitudinally to proceed with direct anterior approach to the hip.  We identified and cauterized circumflex vessels.  Then we were able to identify the femoral neck.  We placed Cobra retractors around the medial and lateral  femoral neck.  This was outside the capsule.  We then opened up the hip capsule and placed those curved retractors around the hip capsule.  Obviously, the femoral neck was quite distorted from it being shortened for a while from her fracture.  We had to  make a femoral neck cut distal to the fracture, but almost at the level of the  calcar just proximal to the lesser trochanter.  We then used an osteotome to complete our cuts.  We placed a corkscrew guide in the femoral head and removed the femoral head  in its entirety.  There was no significant evidence of arthritis.  We then cleaned the acetabulum of remnants of acetabular labrum and other debris.  I placed a bent Hohmann over the medial acetabular rim and then began reaming under direct visualization  from a size 43 reamer in stepwise increments going up to a size 49.  With all reamers placed under direct visualization, the last reamer was placed under direct fluoroscopy, so I could obtain my depth of reaming my inclination and anteversion.  I then  placed the real DePuy Sector Gription acetabular component size 50 and I was very pleased with the fit of this, but I still placed 2 screws given the sclerotic bone and  the fact that she is still a smoker and I want to make sure that we get as much  stability as we can as far as the interface between the components and the bone itself.  I then went with a 32+0 neutral polyethylene liner.  Attention was then turned to the femur.  With the leg externally rotated to 120 degrees, extended and adducted,  I was able to place a Mueller retractor medially and we were able to place a Hohmann retractor above the greater trochanter.  The greater trochanter and the lateral cortex of the femur seemed to be at least in a piece that was stable for [place] to proceed  with anterior hip surgery.  We used a box box-cutting osteotome to enter the femoral canal and used a rongeur to remove a lot of sclerotic bone and callus bone around this area.  Once we gained access to the femoral canal, we then were able to begin  broaching.  We broached from a size 8  with the Corail system up to a size 12.  I was pleased with the fit and fill of the size 12, especially even proximally and so we trialed a standard offset femoral neck and a 32+1 hip ball and reduced this into  the  acetabulum.  We were able to assess this mechanically and radiographically.  It did appear to be stable and I was several lengths beyond the lag screw in terms of being pleased with how far distally we were with the stem itself.  Her leg length and  offset appeared to be near equal.  I definitely lengthened her, which she needed.  We then dislocated the hip and removed the trial components.  I was trying to make a determination whether I should convert this to an AML instead, but I observed the  proximal bone and we did get some good bleeding of this and I felt that our broach was incredibly tight, so I felt that I did not need to place a fully porous coated stem because I felt that the hydroxyapatite coating on the Corail stem would be  sufficient.  I then placed the size 12 standard offset Corail femoral component without difficulty and went with a 32+1 metal hip ball and again reduced this in the acetabulum and we were pleased with leg length, offset, range of motion and stability  assessed mechanically and radiographically.  We then had 4 incisions to close, so we irrigated all these incisions with normal saline solution.  We closed the tensor fascia with #1 Vicryl suture, followed by 0 Vicryl to close the deep tissue of all  incisions, 2-0 Vicryl to close the subcutaneous tissue of all incisions and staples were used to reapproximate the skin on all incisions.  Xeroform and Aquacel dressings were applied.  She was then taken off the Hana table, awakened, extubated and taken  to the recovery room in stable condition.  All final counts were correct.  There were no complications noted.  She was anemic to start off and there was about 500 mL of blood loss, so we will watch her closely postoperatively to determine whether or not  a transfusion is needed, but that will  depend on her clinical exam and vital signs, as well as what her postoperative H and H shows.  Of note, Benita Stabile, PA-C, assisted during  the entire case.  His assistance was crucial for facilitating all aspects of  this case.  Emily Horton  D:02/01/2020 T:02/01/2020 JOB:[id number]

## 2020-02-01 NOTE — Progress Notes (Signed)
Patient ID: Emily Horton, female   DOB: 05/07/1946, 74 y.o.   MRN: [id number] The patient understands fully that we are proceeding to surgery today to remove the hardware from her right hip and to convert her to a total hip arthroplasty.  She understands that this case will be quite difficult.  We talked in detail and length about the risks and benefits of surgery.  Informed consent is obtained and the right lower extremity has been marked.

## 2020-02-01 NOTE — Anesthesia Preprocedure Evaluation (Signed)
Anesthesia Evaluation  Patient identified by MRN, date of birth, ID band Patient awake    Reviewed: Allergy & Precautions, NPO status , Patient's Chart, lab work & pertinent test results  Airway Mallampati: II       Dental  (+) Missing, Poor Dentition   Pulmonary neg pulmonary ROS, Current Smoker and Patient abstained from smoking.,    Pulmonary exam normal        Cardiovascular negative cardio ROS Normal cardiovascular exam Rhythm:Regular Rate:Normal     Neuro/Psych PSYCHIATRIC DISORDERS Depression negative neurological ROS     GI/Hepatic Neg liver ROS, GERD  Medicated and Controlled,  Endo/Other  negative endocrine ROS  Renal/GU negative Renal ROS  negative genitourinary   Musculoskeletal negative musculoskeletal ROS (+)   Abdominal Normal abdominal exam  (+)   Peds negative pediatric ROS (+)  Hematology negative hematology ROS (+)   Anesthesia Other Findings   Reproductive/Obstetrics negative OB ROS                             Anesthesia Physical Anesthesia Plan  ASA: II  Anesthesia Plan: General   Post-op Pain Management:    Induction: Intravenous  PONV Risk Score and Plan: 2 and Ondansetron and Dexamethasone  Airway Management Planned: Oral ETT  Additional Equipment: None  Intra-op Plan:   Post-operative Plan: Extubation in OR  Informed Consent: I have reviewed the patients History and Physical, chart, labs and discussed the procedure including the risks, benefits and alternatives for the proposed anesthesia with the patient or authorized representative who has indicated his/her understanding and acceptance.     Dental advisory given  Plan Discussed with: CRNA  Anesthesia Plan Comments:         Anesthesia Quick Evaluation

## 2020-02-01 NOTE — Care Management Important Message (Signed)
Important Message  Patient Details IM Letter given to [name] SW Case Manager to present to the Patient Name: Emily Horton MRN: [id number] Date of Birth: 02-05-1946   Medicare Important Message Given:  Yes     [name] 02/01/2020, 10:54 AM

## 2020-02-01 NOTE — Progress Notes (Signed)
Subjective: 3 Days Post-Op s/p Procedure(s): RIGHT REVERSE SHOULDER ARTHROPLASTY   Patient is alert, oriented laying comfortably in bed. She reports pain as moderate in right shoulder. She states that she is mainly just very anxious about her upcoming surgery this afternoon and the recovery afterwards. Denies any numbness of tingling in RUE. Denies chest pain, shortness of breath, calf pain, nausea and vomiting. No other complaints this morning.   Objective:  PE: VITALS:   Vitals:   01/31/20 0534 01/31/20 1415 01/31/20 2037 02/01/20 0540  BP: 133/74 (!) 149/135 125/74 (!) 149/71  Pulse: 86 74 87 85  Resp: 18 16 18 18   Temp: 98.1 F (36.7 C) 98.8 F (37.1 C) 98.1 F (36.7 C) 98.2 F (36.8 C)  TempSrc: Oral Oral Oral Oral  SpO2: 94% 94% 97% 99%  Weight:      Height:        General: Laying comfortably, in bed, in not acute distress Abdomen: soft, non-tender MSK: Right arm in sling. Able to flex, extend, and abduct all fingers of right hand. Distal sensation intact. Intact radial pulse. Dressing C/D/I.  LABS  No results found for this or any previous visit (from the past 24 hour(s)).  CT HIP RIGHT WO CONTRAST  Result Date: 01/30/2020 CLINICAL DATA:  Right hip pain since surgery 6 months ago. EXAM: CT OF THE RIGHT HIP WITHOUT CONTRAST TECHNIQUE: Multidetector CT imaging of the right hip was performed according to the standard protocol. Multiplanar CT image reconstructions were also generated. COMPARISON:  CT right hip 11/23/2019. Radiographs 06/22/2019. FINDINGS: Bones/Joint/Cartilage Status post dynamic compression screw plate fixation of the right femur with a long intramedullary nail, the distal components of which are not imaged by this examination. The visualized hardware is intact. There is stable mild lucency surrounding the proximal aspect of the intramedullary nail. There has been no change in the alignment of the comminuted intertrochanteric right femur fracture.  Small areas of bridging bone are demonstrated anteriorly and laterally, although many of the fracture lines are still visible, consistent with incomplete healing. No evidence of acute fracture, dislocation or femoral head avascular necrosis. The visualized inferior right hemipelvis is intact. No significant right hip arthropathy, although there is a possible small joint effusion. Ligaments Suboptimally assessed by CT. Muscles and Tendons Unremarkable. Soft tissues Stable postsurgical changes in the lateral subcutaneous fat without focal fluid collection. IMPRESSION: 1. Stable appearance of the comminuted intertrochanteric right femur fracture post ORIF. The fracture demonstrates incomplete healing but stable alignment. 2. No evidence of acute fracture, dislocation or femoral head avascular necrosis. Electronically Signed   By: Richardean Sale M.D.   On: 01/30/2020 20:22    Assessment/Plan: Principal Problem:   Closed fracture of right proximal humerus  3 Days Post-Op s/p Procedure(s): RIGHT REVERSE SHOULDER ARTHROPLASTY  Weightbearing: Non-weight bearing in right arm, remain in sling.   Insicional and dressing care: PRN dressing changes of RUE dressing VTE prophylaxis: Will be started after total hip today Pain control: continue Prn pain control Follow - up plan: Follow up with Dr. Mardelle Matte in 2 weeks  Patient is ready to be discharged from [facility], but is undergoing a total hip arthroplasty with Dr. 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 Will defer to Dr. Ninfa Linden regarding when she can be discharged regarding her hip, plan to discharge to SNF when bed is available.   Contact information:   Weekdays 8-5 [address], [place] [phone] A fter hours and holidays  please check [email or web address] for group call information for [facility]  Ventura Bruns 02/01/2020, 7:06 AM

## 2020-02-01 NOTE — Anesthesia Postprocedure Evaluation (Signed)
Anesthesia Post Note  Patient: Emily Horton  Procedure(s) Performed: RIGHT REVERSE SHOULDER ARTHROPLASTY (Right Shoulder)     Patient location during evaluation: PACU Anesthesia Type: General and Regional Level of consciousness: awake and alert Pain management: pain level controlled Vital Signs Assessment: post-procedure vital signs reviewed and stable Respiratory status: spontaneous breathing, nonlabored ventilation, respiratory function stable and patient connected to nasal cannula oxygen Cardiovascular status: blood pressure returned to baseline and stable Postop Assessment: no apparent nausea or vomiting Anesthetic complications: no    Last Vitals:  Vitals:   01/31/20 2037 02/01/20 0540  BP: 125/74 (!) 149/71  Pulse: 87 85  Resp: 18 18  Temp: 36.7 C 36.8 C  SpO2: 97% 99%    Last Pain:  Vitals:   02/01/20 0540  TempSrc: Oral  PainSc:    Pain Goal: Patients Stated Pain Goal: 2 (02/01/20 0347)                 [name]

## 2020-02-02 LAB — BASIC METABOLIC PANEL
Anion gap: 7 (ref 5–15)
BUN: 18 mg/dL (ref 8–23)
CO2: 26 mmol/L (ref 22–32)
Calcium: 8.6 mg/dL — ABNORMAL LOW (ref 8.9–10.3)
Chloride: 104 mmol/L (ref 98–111)
Creatinine, Ser: 0.66 mg/dL (ref 0.44–1.00)
GFR calc Af Amer: >60 mL/min (ref >60)
GFR calc non Af Amer: >60 mL/min (ref >60)
Glucose, Bld: 120 mg/dL — ABNORMAL HIGH (ref 70–99)
Potassium: 4.7 mmol/L (ref 3.5–5.1)
Sodium: 137 mmol/L (ref 135–145)

## 2020-02-02 LAB — CBC
HCT: 23.8 % — ABNORMAL LOW (ref 36.0–46.0)
Hemoglobin: 7.6 g/dL — ABNORMAL LOW (ref 12.0–15.0)
MCH: 31.5 pg (ref 26.0–34.0)
MCHC: 31.9 g/dL (ref 30.0–36.0)
MCV: 98.8 fL (ref 80.0–100.0)
Platelets: 216 10*3/uL (ref 150–400)
RBC: 2.41 MIL/uL — ABNORMAL LOW (ref 3.87–5.11)
RDW: 12.7 % (ref 11.5–15.5)
WBC: 8.2 10*3/uL (ref 4.0–10.5)
nRBC: 0 % (ref 0.0–0.2)

## 2020-02-02 LAB — HEMOGLOBIN AND HEMATOCRIT, BLOOD
HCT: 24.3 % — ABNORMAL LOW (ref 36.0–46.0)
Hemoglobin: 7.9 g/dL — ABNORMAL LOW (ref 12.0–15.0)

## 2020-02-02 LAB — PREPARE RBC (CROSSMATCH)

## 2020-02-02 MED ORDER — SODIUM CHLORIDE 0.9 % IV BOLUS: 1000.0000 mL | Freq: Once | INTRAVENOUS | Status: DC

## 2020-02-02 MED ORDER — SODIUM CHLORIDE 0.9% IV SOLUTION: Freq: Once | INTRAVENOUS | Status: AC

## 2020-02-02 MED ORDER — SODIUM CHLORIDE 0.9% IV SOLUTION: Freq: Once | INTRAVENOUS | Status: DC

## 2020-02-02 NOTE — Progress Notes (Signed)
  Paged attending PA, Finnlee Guarnieri about patients orthostatic BP's after physical therapy. BP reported from physical therapy to be 60's/50's.   Patient reports dizziness.   PA stated she would review chart, and add orders for patient.   Will continue to assess, and watch for new orders regarding care.       Vitals:   02/02/20 0127 02/02/20 0435  BP: 110/64 (!) 100/49  Pulse: 82 82  Resp: 15 16  Temp: 98.4 F (36.9 C) 98.6 F (37 C)  SpO2: 100% 100%        Nat Math, Rifky Lapre J 02/02/2020,9:18 AM

## 2020-02-02 NOTE — Progress Notes (Addendum)
Orthopaedic Trauma Progress Note  S: Doing okay this morning.  Moderate pain in the right lower extremity.  Shoulder sore but pain manageable.  Denies numbness or tingling.  Has not been up with therapy yet this morning.  O:  Vitals:   02/02/20 0127 02/02/20 0435  BP: 110/64 (!) 100/49  Pulse: 82 82  Resp: 15 16  Temp: 98.4 F (36.9 C) 98.6 F (37 C)  SpO2: 100% 100%    General - Sitting up in bed, no acute distress.  Talking on the phone Respiratory -  No increased work of breathing.  Right upper extremity - Sling in place.  Dressing over shoulder is clean, dry, and intact.  Bruising noted throughout shoulder and upper arm.  Mild global tenderness with palpation of shoulder.  Nontender in the elbow or forearm.  Tolerates some elbow motion. Able to flex, extend, and abduct all fingers of right hand. Distal sensation intact. Intact radial pulse.  Imaging: Stable post op imaging.   Labs:  Results for orders placed or performed during the hospital encounter of 01/29/20 (from the past 24 hour(s))  Glucose, capillary     Status: Abnormal   Collection Time: 02/01/20 10:30 AM  Result Value Ref Range   Glucose-Capillary 130 (H) 70 - 99 mg/dL  Hemoglobin and hematocrit, blood     Status: Abnormal   Collection Time: 02/01/20  3:09 PM  Result Value Ref Range   Hemoglobin 9.3 (L) 12.0 - 15.0 g/dL   HCT 29.1 (L) 36.0 - 46.0 %  CBC     Status: Abnormal   Collection Time: 02/02/20  3:43 AM  Result Value Ref Range   WBC 8.2 4.0 - 10.5 K/uL   RBC 2.41 (L) 3.87 - 5.11 MIL/uL   Hemoglobin 7.6 (L) 12.0 - 15.0 g/dL   HCT 23.8 (L) 36.0 - 46.0 %   MCV 98.8 80.0 - 100.0 fL   MCH 31.5 26.0 - 34.0 pg   MCHC 31.9 30.0 - 36.0 g/dL   RDW 12.7 11.5 - 15.5 %   Platelets 216 150 - 400 K/uL   nRBC 0.0 0.0 - 0.2 %  Basic metabolic panel     Status: Abnormal   Collection Time: 02/02/20  3:43 AM  Result Value Ref Range   Sodium 137 135 - 145 mmol/L   Potassium 4.7 3.5 - 5.1 mmol/L   Chloride 104 98 -  111 mmol/L   CO2 26 22 - 32 mmol/L   Glucose, Bld 120 (H) 70 - 99 mg/dL   BUN 18 8 - 23 mg/dL   Creatinine, Ser 0.66 0.44 - 1.00 mg/dL   Calcium 8.6 (L) 8.9 - 10.3 mg/dL   GFR calc non Af Amer >60 >60 mL/min   GFR calc Af Amer >60 >60 mL/min   Anion gap 7 5 - 15    Assessment: 74 year old female 1.  Right proximal humerus fracture s/p reverse shoulder arthroplasty on 01/29/2020 2.  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 Ninfa Linden   Weightbearing: NWB RUE, WBAT RLE   Insicional and dressing care: Continue to change dressings as needed  Orthopedic device(s): None   CV/Blood loss: Acute blood loss anemia, Hgb 7.6 this morning. Hemodynamically stable.  Continue to monitor CBC  Pain management:  1. Tylenol 325- 650 mg q 6 hours PRN 2. Robaxin 500 mg q 6 hours PRN 3. Oxycodone 5-15 mg q 4 hours PRN 4. Neurontin 100 mg TID 5. Dilaudid 0.5-1 mg q  4 hours PRN 6. Morphine 0.5-1 mg q 2 hours PRN  VTE prophylaxis: Aspirin 81 mg BID SCDs: Ordered, placed on bilateral lower extremities  ID: Ancef completed  Foley/Lines: Foley in place, KVO IVFs  Dispo: Patient is ready to be discharged from [facility]. Will defer to Dr. Ninfa Linden regarding when she can be discharged regarding her hip, plan to discharge to SNF when bed is available.   Follow - up plan: 2 weeks with Dr. Preston Fleeting information:  Katha Hamming MD, Patrecia Pace PA-C   Nevah Dalal A. [facility] [phone] (office) [email or web address]

## 2020-02-02 NOTE — Progress Notes (Signed)
Subjective: 1 Day Post-Op Procedure(s) (LRB): TOTAL HIP ARTHROPLASTY ANTERIOR APPROACH (Right) HARDWARE REMOVAL (Right) Patient reports pain as moderate.  Tolerated surgery very well yesterday.  It was an extensive surgery to remove the previous hardware and then to perform a hip replacement.  We were also able to get her leg lengths back out to equal (she was shortened on her right side from her previous fracture compressing/collapsing.  She does have acute blood loss anemia from this surgery.  Objective: Vital signs in last 24 hours: Temp:  [98.2 F (36.8 C)-99.5 F (37.5 C)] 98.6 F (37 C) (02/27 0435) Pulse Rate:  [82-100] 82 (02/27 0435) Resp:  [12-20] 16 (02/27 0435) BP: (61-139)/(43-79) 100/49 (02/27 0435) SpO2:  [92 %-100 %] 100 % (02/27 0435)  Intake/Output from previous day: 02/26 0701 - 02/27 0700 In: 4761.8 [P.O.:480; I.V.:1085.8; IV Piggyback:3196] Out: 2000 [Urine:1450; Blood:550] Intake/Output this shift: No intake/output data recorded.  Recent Labs    01/31/20 0305 02/01/20 1509 02/02/20 0343  HGB 9.2* 9.3* 7.6*   Recent Labs    01/31/20 0305 01/31/20 0305 02/01/20 1509 02/02/20 0343  WBC 10.3  --   --  8.2  RBC 2.97*  --   --  2.41*  HCT 29.1*   < > 29.1* 23.8*  PLT 163  --   --  216   < > = values in this interval not displayed.   Recent Labs    02/02/20 0343  NA 137  K 4.7  CL 104  CO2 26  BUN 18  CREATININE 0.66  GLUCOSE 120*  CALCIUM 8.6*   No results for input(s): LABPT, INR in the last 72 hours.  Sensation intact distally Intact pulses distally Dorsiflexion/Plantar flexion intact Incision: scant drainage   Assessment/Plan: 1 Day Post-Op Procedure(s) (LRB): TOTAL HIP ARTHROPLASTY ANTERIOR APPROACH (Right) HARDWARE REMOVAL (Right) Up with therapy - can WBAT on her right hip. I spoke with the patient about transfusing one unit of blood to treat her acute blood loss anemia and she agrees.      Mcarthur Rossetti 02/02/2020, 10:07 AM

## 2020-02-02 NOTE — Progress Notes (Signed)
Physical Therapy Re-Evaluation Patient Details Name: Emily Horton MRN: [id number] DOB: 27-Jan-1946 Today's Date: 02/02/2020    History of Present Illness Emily Horton is a 74 y.o. female who fell pushing garbage can on 01/27/20 and sustained right proximal humerus fracture. 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 S/P THA through direct anterior approach and retained  hardware/IMN    PT Comments    The patient did assist  Self in bed mobility, requires 2 max assist to sit to bed edge. Stood at [employer] using LUE only for pivot steps to recliner. Patient noted to be pale and feels Dizzy. BP 83/29, HR 89, Sats 95%. Subsequent BP's in recliner with legs elevated are: 87/35, 93/58, 66/56. RN notified ho notified MD. Patient remained in recliner, RN to start fluids. Will assist back into bed when BP more stable. HGB 7.6, to also get unit of blood per RN. Continue PT for mobility and ROM exercises.  Follow Up Recommendations  SNF     Equipment Recommendations  None recommended by PT    Recommendations for Other Services       Precautions / Restrictions Precautions Precautions: Fall Precaution Comments: very painful right thigh/hip Required Braces or Orthoses: Sling Restrictions Weight Bearing Restrictions: Yes RUE Weight Bearing: Non weight bearing RLE Weight Bearing: Weight bearing as tolerated Other Position/Activity Restrictions: sling at all times, AROM of R elbow, wrist, and hand only     Mobility  Bed Mobility Overal bed mobility: Needs Assistance Bed Mobility: Supine to Sit     Supine to sit: Max assist;+2 for physical assistance;+2 for safety/equipment     General bed mobility comments: assist with  right leg and trunk, patient did  move rLE some to self assist. Attempted to hook left tfoot under right foot  Transfers Overall transfer level: Needs assistance Equipment used: Rolling walker (2  wheeled) Transfers: Risk manager;Sit to/from Stand Sit to Stand: +2 physical assistance;Mod assist Stand pivot transfers: Max assist;+2 physical assistance;+2 safety/equipment       General transfer comment: cues to not use R UE while mobilizing with walker , stabilize the right side of RW, scoot  on left foot , drags right around, difficulty to bear weight but did to  several steps. Assist with right leg when sitting down to recliner.  Ambulation/Gait                 Stairs             Wheelchair Mobility    Modified Rankin (Stroke Patients Only)       Balance Overall balance assessment: Needs assistance;History of Falls Sitting-balance support: Feet supported;Bilateral upper extremity supported Sitting balance-Leahy Scale: Fair     Standing balance support: During functional activity;Bilateral upper extremity supported Standing balance-Leahy Scale: Poor                              Cognition Arousal/Alertness: Awake/alert Behavior During Therapy: Anxious Overall Cognitive Status: Impaired/Different from baseline Area of Impairment: Memory;Problem solving;Orientation                             Problem Solving: Slow processing        Exercises Hand Exercises Forearm Supination: AROM;5 reps Forearm Pronation: AROM;5 reps Wrist Flexion: AROM;5 reps Wrist Extension: AROM;5 reps Donning/doffing shirt without moving shoulder:  Moderate assistance Method for sponge bathing under operated UE: Moderate assistance Donning/doffing sling/immobilizer: Moderate assistance Correct positioning of sling/immobilizer: Moderate assistance ROM for elbow, wrist and digits of operated UE: Supervision/safety Sling wearing schedule (on at all times/off for ADL's): Supervision/safety Proper positioning of operated UE when showering: Moderate assistance Positioning of UE while sleeping: Moderate assistance    General Comments General  comments (skin integrity, edema, etc.): increased swelling in  RUE shoulder and R hip area       Pertinent Vitals/Pain Pain Assessment: 0-10 Pain Score: 6  Faces Pain Scale: Hurts even more Pain Location: R HIP  Pain Descriptors / Indicators: Discomfort Pain Intervention(s): Monitored during session;Premedicated before session;Limited activity within patient's tolerance;Repositioned;Relaxation;Ice applied    Home Living Family/patient expects to be discharged to:: Private residence Living Arrangements: Alone Available Help at Discharge: Family;Available PRN/intermittently Type of Home: House Home Access: Stairs to enter Entrance Stairs-Rails: Right;Left Home Layout: Two level;Able to live on main level with bedroom/bathroom Home Equipment: Gilford Rile - 2 wheels;Cane - single point;Shower seat;Grab bars - tub/shower      Prior Function Level of Independence: Independent          PT Goals (current goals can now be found in the care plan section) Acute Rehab PT Goals Patient Stated Goal: I guess I need to go to rehab PT Goal Formulation: With patient Time For Goal Achievement: 02/16/20 Potential to Achieve Goals: Fair Progress towards PT goals: Goals downgraded-see care plan(postop re-eval)    Frequency    Min 3X/week      PT Plan Current plan remains appropriate    Co-evaluation PT/OT/SLP Co-Evaluation/Treatment: Yes Reason for Co-Treatment: Complexity of the patient's impairments (multi-system involvement);For patient/therapist safety PT goals addressed during session: Mobility/safety with mobility OT goals addressed during session: ADL's and self-care      AM-PAC PT "6 Clicks" Mobility   Outcome Measure  Help needed turning from your back to your side while in a flat bed without using bedrails?: A Lot Help needed moving from lying on your back to sitting on the side of a flat bed without using bedrails?: A Lot Help needed moving to and from a bed to a chair  (including a wheelchair)?: Total Help needed standing up from a chair using your arms (e.g., wheelchair or bedside chair)?: Total Help needed to walk in hospital room?: Total Help needed climbing 3-5 steps with a railing? : Total 6 Click Score: 8    End of Session Equipment Utilized During Treatment: Gait belt Activity Tolerance: Patient limited by fatigue;Patient limited by pain Patient left: in chair;with call bell/phone within reach;with chair alarm set;with nursing/sitter in room Nurse Communication: Mobility status PT Visit Diagnosis: Unsteadiness on feet (R26.81);Difficulty in walking, not elsewhere classified (R26.2);Repeated falls (R29.6)     Time: [id number] PT Time Calculation (min) (ACUTE ONLY): 70 min  Charges:  $Therapeutic Activity: 23-37 mins            Re-evaluation 8-22"         Tresa Endo PT Acute Rehabilitation Services Pager [phone] Office [phone]    Claretha Cooper 02/02/2020, 2:15 PM

## 2020-02-02 NOTE — Progress Notes (Signed)
Patient verbalized to keep her Foley afraid of moving due to pain patient was given pain medicine, also refused to Dangle due to severe pain.

## 2020-02-02 NOTE — Progress Notes (Signed)
Physical Therapy Treatment Patient Details Name: Emily Horton MRN: [id number] DOB: 06-17-1946 Today's Date: 02/02/2020    History of Present Illness Emily Horton is a 74 y.o. female who fell pushing garbage can on 01/27/20 and sustained right proximal humerus fracture. 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 S/P THA through direct anterior approach and retained  hardware/IMN    PT Comments    Patient's BP is up per RN. assisted with 2 to stand and pivot back to bed, return to supine with assistance of 2. Decreased ability to bear weight on right leg, support at knee provided. Continue PT for mobility.  Follow Up Recommendations  SNF     Equipment Recommendations  None recommended by PT    Recommendations for Other Services       Precautions / Restrictions Precautions Precautions: Fall Precaution Comments: very painful right thigh/hip Required Braces or Orthoses: Sling Restrictions Weight Bearing Restrictions: Yes RUE Weight Bearing: Non weight bearing RLE Weight Bearing: Weight bearing as tolerated Other Position/Activity Restrictions: sling at all times, AROM of R elbow, wrist, and hand only     Mobility  Bed Mobility   Bed Mobility: Sit to Supine       Sit to supine: +2 for safety/equipment;+2 for physical assistance;Max assist   General bed mobility comments: assist with legs and trunk  Transfers Overall transfer level: Needs assistance Equipment used: Rolling walker (2 wheeled) Transfers: Risk manager;Sit to/from Stand Sit to Stand: +2 physical assistance;Mod assist;Max assist Stand pivot transfers: Max assist;+2 physical assistance;+2 safety/equipment       General transfer comment: assist to rise from recliner, Used LUE on RW, support at right knee for small steps with LLE to turn. small back up steps with left. Assist to sit down, support of right leg to prevent increased  knee flexion  Ambulation/Gait                 Stairs             Wheelchair Mobility    Modified Rankin (Stroke Patients Only)       Balance Overall balance assessment: Needs assistance;History of Falls Sitting-balance support: Feet supported;Bilateral upper extremity supported Sitting balance-Leahy Scale: Fair     Standing balance support: During functional activity;Bilateral upper extremity supported                                Cognition Arousal/Alertness: Awake/alert Behavior During Therapy: Anxious Overall Cognitive Status: Impaired/Different from baseline Area of Impairment: Memory;Problem solving;Orientation                             Problem Solving: Slow processing General Comments: appears to be more on track thios visit.      Exercises      General Comments        Pertinent Vitals/Pain Pain Score: 10-Worst pain ever Faces Pain Scale: Hurts even more Pain Location: R HIP  Pain Descriptors / Indicators: Discomfort Pain Intervention(s): Monitored during session;Patient requesting pain meds-RN notified;Repositioned    Home Living                      Prior Function            PT Goals (current goals can now be found in the care plan section)  Acute Rehab PT Goals Patient Stated Goal: I guess I need to go to rehab PT Goal Formulation: With patient Time For Goal Achievement: 02/16/20 Potential to Achieve Goals: Fair Progress towards PT goals: Progressing toward goals    Frequency    Min 3X/week      PT Plan Current plan remains appropriate    Co-evaluation PT/OT/SLP Co-Evaluation/Treatment: Yes Reason for Co-Treatment: Complexity of the patient's impairments (multi-system involvement);For patient/therapist safety PT goals addressed during session: Mobility/safety with mobility OT goals addressed during session: ADL's and self-care      AM-PAC PT "6 Clicks" Mobility   Outcome  Measure  Help needed turning from your back to your side while in a flat bed without using bedrails?: A Lot Help needed moving from lying on your back to sitting on the side of a flat bed without using bedrails?: Total Help needed moving to and from a bed to a chair (including a wheelchair)?: A Lot Help needed standing up from a chair using your arms (e.g., wheelchair or bedside chair)?: A Lot Help needed to walk in hospital room?: Total Help needed climbing 3-5 steps with a railing? : Total 6 Click Score: 9    End of Session Equipment Utilized During Treatment: Gait belt Activity Tolerance: Patient limited by fatigue;Patient limited by pain Patient left: in bed;with call bell/phone within reach;with nursing/sitter in room Nurse Communication: Mobility status PT Visit Diagnosis: Unsteadiness on feet (R26.81);Difficulty in walking, not elsewhere classified (R26.2);Repeated falls (R29.6)     Time: [id number] PT Time Calculation (min) (ACUTE ONLY): 23 min  Charges:  $Therapeutic Activity: 23-37 mins                     Tresa Endo PT Acute Rehabilitation Services Pager [phone] Office [phone]    Emily Horton 02/02/2020, 4:41 PM

## 2020-02-02 NOTE — Progress Notes (Signed)
Occupational Therapy Re-evaluation  Clinical Impression:  Patient re-evaluated on 02/02/2020 due to recent medical procedure on 2/26 for R LE IM Nail removal with replacement of Anterior THA. Patient required a co-evaluation with PT secondary to decline in physical and cognitive status. Patient's BP dropped to 83/29, 66/56, up to 77/48 with RN in room, final BP OF 84/62. Patient's O2 stats were in the 90s on room air, but was re-appiled with 2L of O2 when BP was dropping. Patient was able to complete simple ADL tasks sitting in recliner, but required increase in assist and Max A x 2 for functional transfers. Patient will benefit from continued skilled acute OT services.     02/02/20 1200  OT Visit Information  Assistance Needed +2  PT/OT/SLP Co-Evaluation/Treatment Yes  Reason for Co-Treatment Complexity of the patient's impairments (multi-system involvement);For patient/therapist safety;To address functional/ADL transfers  PT goals addressed during session Mobility/safety with mobility  OT goals addressed during session ADL's and self-care  History of Present Illness Ambre G Seng is a 74 y.o. female who fell pushing garbage can on 01/27/20 and sustained right proximal humerus fracture. 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 S/P THA through direct anterior approach and retained  hardware/IMN  Precautions  Precautions Fall  Precaution Comments very painful right thigh/hip  Required Braces or Orthoses Sling  Restrictions  RUE Weight Bearing NWB  Other Position/Activity Restrictions sling at all times, AROM of R elbow, wrist, and hand only   Home Living  Family/patient expects to be discharged to: Private residence  Living Arrangements Alone  Available Help at Discharge Family;Available PRN/intermittently  Type of Home House  Home Access Stairs to enter  Entrance Stairs-Number of Steps 3  Entrance Stairs-Rails  Right;Left  Home Layout Two level;Able to live on main level with bedroom/bathroom  Print production planner Handicapped height  Bathroom Accessibility Yes  How Accessible Accessible via walker  [place] - 2 wheels;Cane - single point;Shower seat;Grab bars - tub/shower  Prior Function  Level of Independence Independent  Communication  Communication No difficulties  Pain Assessment  Pain Assessment 0-10  Pain Score 6  Faces Pain Scale 6  Pain Location R HIP   Pain Descriptors / Indicators Discomfort  Pain Intervention(s) Repositioned;Ice applied  Cognition  Arousal/Alertness Awake/alert  Behavior During Therapy WFL for tasks assessed/performed  Overall Cognitive Status Impaired/Different from baseline  Area of Impairment Memory;Problem solving;Orientation  Problem Solving Slow processing  Upper Extremity Assessment  Upper Extremity Assessment RUE deficits/detail  RUE Unable to fully assess due to immobilization (ELBOW, WRIST AND HAND WFL)  Lower Extremity Assessment  Lower Extremity Assessment Defer to PT evaluation  ADL  Eating/Feeding Set up;Sitting  Grooming Oral care;Wash/dry face;Brushing hair;Minimal assistance;Sitting  Upper Body Bathing Moderate assistance  Lower Body Bathing Maximal assistance  Upper Body Dressing  Minimal assistance  Lower Body Dressing Maximal assistance  Toilet Transfer Maximal assistance;+2 for physical assistance;+2 for safety/equipment  Toileting- Clothing Manipulation and Hygiene Maximal assistance  Vision- Assessment  Vision Assessment? No apparent visual deficits  Bed Mobility  Overal bed mobility Needs Assistance  Bed Mobility Supine to Sit  Supine to sit Max assist;+2 for physical assistance;+2 for safety/equipment  Transfers  Overall transfer level Needs assistance  Equipment used Rolling walker (2 wheeled)  Transfers Stand Pivot Transfers  Sit to Stand +2 physical assistance;Mod assist   Stand pivot transfers Max assist;+2 physical assistance;+2 safety/equipment  General  transfer comment cues to not use R UE while mobilizing with walker   Balance  Sitting-balance support No upper extremity supported  Sitting balance-Leahy Scale Fair  Standing balance-Leahy Scale Poor  General Comments  General comments (skin integrity, edema, etc.) increased swelling in  RUE shoulder and R hip area   Exercises  Exercises Hand exercises  Shoulder Instructions  Donning/doffing shirt without moving shoulder Moderate assistance  Method for sponge bathing under operated UE Moderate assistance  Donning/doffing sling/immobilizer Moderate assistance  Correct positioning of sling/immobilizer Moderate assistance  ROM for elbow, wrist and digits of operated UE Supervision/safety  Sling wearing schedule (on at all times/off for ADL's) Supervision/safety  Proper positioning of operated UE when showering Moderate assistance  Positioning of UE while sleeping Moderate assistance  Hand Exercises  Forearm Supination AROM;5 reps  Forearm Pronation AROM;5 reps  Wrist Flexion AROM;5 reps  Wrist Extension AROM;5 reps  OT - End of Session  Equipment Utilized During Treatment Gait belt  Activity Tolerance Patient limited by lethargy;Patient limited by fatigue;Patient limited by pain  Patient left in chair;with call bell/phone within reach;with nursing/sitter in room;with chair alarm set  Nurse Communication Mobility status (low BP )  OT Assessment  OT Recommendation/Assessment Patient needs continued OT Services  OT Visit Diagnosis Unsteadiness on feet (R26.81);Other abnormalities of gait and mobility (R26.89);Muscle weakness (generalized) (M62.81);History of falling (Z91.81)  OT Problem List Decreased strength;Decreased range of motion;Decreased activity tolerance;Impaired balance (sitting and/or standing);Decreased coordination;Decreased safety awareness;Decreased knowledge of use of DME or AE;Decreased  knowledge of precautions;Impaired UE functional use;Increased edema  OT Plan  OT Frequency (ACUTE ONLY) Min 2X/week  OT Treatment/Interventions (ACUTE ONLY) Self-care/ADL training;Patient/family education;Therapeutic exercise;DME and/or AE instruction;Therapeutic activities  AM-PAC OT "6 Clicks" Daily Activity Outcome Measure (Version 2)  Help from another person eating meals? 3  Help from another person taking care of personal grooming? 3  Help from another person toileting, which includes using toliet, bedpan, or urinal? 2  Help from another person bathing (including washing, rinsing, drying)? 2  Help from another person to put on and taking off regular upper body clothing? 2  Help from another person to put on and taking off regular lower body clothing? 2  6 Click Score 14  OT Recommendation  Follow Up Recommendations SNF  OT Equipment None recommended by OT  Individuals Consulted  Consulted and Agree with Results and Recommendations Patient  Acute Rehab OT Goals  Patient Stated Goal I guess I need to go to rehab  OT Goal Formulation With patient  Time For Goal Achievement 02/16/20  Potential to Achieve Goals Fair  OT Time Calculation  OT Start Time (ACUTE ONLY) [id number]  OT Stop Time (ACUTE ONLY) 0926  OT Time Calculation (min) 54 min  OT General Charges  $OT Visit 1 Visit  OT Evaluation  $OT Re-eval 1 Re-eval  OT Treatments  $Self Care/Home Management  8-22 mins  $Therapeutic Exercise 8-22 mins  Written Expression  Dominant Hand Right   Averill Winters OTR/L

## 2020-02-02 NOTE — Progress Notes (Signed)
PT/OT Note Patient assisted to recliner with BP dropping to 83/29, 66/56, up to 77/48 with RN in room. Patient  Will remain in recliner until BP stabilizes to return to bed. May need to return via lift equipment. Full not to follow. HR remained in 80's, SPO2 > 95% on RA throughout. Placed on 2 l Dora.  [place] Pager [phone] Office [phone]

## 2020-02-03 LAB — CBC
HCT: 23.4 % — ABNORMAL LOW (ref 36.0–46.0)
HCT: 22.7 % — ABNORMAL LOW (ref 36.0–46.0)
Hemoglobin: 7.5 g/dL — ABNORMAL LOW (ref 12.0–15.0)
Hemoglobin: 7.6 g/dL — ABNORMAL LOW (ref 12.0–15.0)
MCH: 30.1 pg (ref 26.0–34.0)
MCH: 30.5 pg (ref 26.0–34.0)
MCHC: 32.1 g/dL (ref 30.0–36.0)
MCHC: 33.5 g/dL (ref 30.0–36.0)
MCV: 94 fL (ref 80.0–100.0)
MCV: 91.2 fL (ref 80.0–100.0)
Platelets: 181 10*3/uL (ref 150–400)
Platelets: 194 10*3/uL (ref 150–400)
RBC: 2.49 MIL/uL — ABNORMAL LOW (ref 3.87–5.11)
RBC: 2.49 MIL/uL — ABNORMAL LOW (ref 3.87–5.11)
RDW: 14.4 % (ref 11.5–15.5)
RDW: 14.3 % (ref 11.5–15.5)
WBC: 7.9 10*3/uL (ref 4.0–10.5)
WBC: 10 10*3/uL (ref 4.0–10.5)
nRBC: 0 % (ref 0.0–0.2)
nRBC: 0 % (ref 0.0–0.2)

## 2020-02-03 LAB — ABO/RH: ABO/RH(D): O POS

## 2020-02-03 NOTE — Progress Notes (Signed)
Sarah PA notified of pt hemoglobin of 7.6 on repeat cbc. She stated it was ok for therapy to work with pt but she may still get one unit of blood if she has s/s.

## 2020-02-03 NOTE — Plan of Care (Signed)
  Problem: Pain Management: Goal: Pain level will decrease Outcome: Progressing   Problem: Skin Integrity: Goal: Signs of wound healing will improve Outcome: Progressing   Problem: Respiratory: Goal: Ability to maintain a clear airway will improve Outcome: Progressing   Problem: Coping: Goal: Level of anxiety will decrease Outcome: Progressing   Problem: Activity: Goal: Ability to avoid complications of mobility impairment will improve Outcome: Progressing   Problem: Clinical Measurements: Goal: Postoperative complications will be avoided or minimized Outcome: Progressing

## 2020-02-03 NOTE — Progress Notes (Signed)
Emily Roch pa notified of pt poor urinary output. Pt vs and overall condition remain normal. Pt did not get dizzy or weak with physical therapy. Bladder scan showed 113cc of urine in bladder. Rn to continue to monitor pt overall condition.

## 2020-02-03 NOTE — Progress Notes (Signed)
Physical Therapy Treatment Patient Details Name: Emily H[place] MRN: [id number] DOB: December 27, 1945 Today's Date: 02/03/2020    History of Present Illness Emily H[place] is a 74 y.o. female who fell pushing garbage can on 01/27/20 and sustained right proximal humerus fracture. 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 S/P THA through direct anterior approach and retained  hardware/IMN    PT Comments    Pt continues motivated but continues to fatigue easily.  Pt performed THR therex with assist and mobilized to EOB sitting as well as standing x 2 with assist for ~ five minutes total.  Pt denies dizziness this session with BP in standing 126/72.   Follow Up Recommendations  SNF     Equipment Recommendations  None recommended by PT    Recommendations for Other Services OT consult     Precautions / Restrictions Precautions Precautions: Fall Required Braces or Orthoses: Sling Restrictions Weight Bearing Restrictions: Yes RUE Weight Bearing: Non weight bearing RLE Weight Bearing: Weight bearing as tolerated Other Position/Activity Restrictions: sling at all times, AROM of R elbow, wrist, and hand only     Mobility  Bed Mobility Overal bed mobility: Needs Assistance Bed Mobility: Sit to Supine;Supine to Sit     Supine to sit: Mod assist;+2 for physical assistance;+2 for safety/equipment Sit to supine: Mod assist;+2 for physical assistance;+2 for safety/equipment   General bed mobility comments: Increased time with cues for sequence and use of L LE to self assist.  Physical assist to manage R LE and to control trunk  Transfers Overall transfer level: Needs assistance Equipment used: Rolling walker (2 wheeled) Transfers: Sit to/from Stand Sit to Stand: +2 physical assistance;Min assist;Mod assist;From elevated surface         General transfer comment: cues for LE management and use of UEs to self assist.   Physical assist to bring wt up and fwd and to balance in standing  Ambulation/Gait             General Gait Details: Pt stood x 2 for total ~5 min - no c/o dizziness with BP in standing 126/72   Stairs             Wheelchair Mobility    Modified Rankin (Stroke Patients Only)       Balance Overall balance assessment: Needs assistance;History of Falls Sitting-balance support: Feet supported;Bilateral upper extremity supported Sitting balance-Leahy Scale: Fair     Standing balance support: During functional activity;Bilateral upper extremity supported Standing balance-Leahy Scale: Poor Standing balance comment: needs 1 UE for support at all times.                            Cognition Arousal/Alertness: Awake/alert Behavior During Therapy: Anxious Overall Cognitive Status: No family/caregiver present to determine baseline cognitive functioning                                        Exercises Total Joint Exercises Ankle Circles/Pumps: AROM;Both;15 reps;Supine Quad Sets: AROM;Both;10 reps;Supine Heel Slides: AAROM;Right;20 reps;Supine Hip ABduction/ADduction: AAROM;Right;15 reps;Supine    General Comments        Pertinent Vitals/Pain Pain Assessment: 0-10 Pain Score: 8 ("I always say 8") Pain Location: R HIP  Pain Descriptors / Indicators: Discomfort Pain Intervention(s): Limited activity within patient's tolerance;Premedicated before session;Monitored during session;Ice  applied    Home Living                      Prior Function            PT Goals (current goals can now be found in the care plan section) Acute Rehab PT Goals Patient Stated Goal: Regain IND PT Goal Formulation: With patient Time For Goal Achievement: 02/16/20 Potential to Achieve Goals: Fair Progress towards PT goals: Progressing toward goals    Frequency    Min 3X/week      PT Plan Current plan remains appropriate    Co-evaluation               AM-PAC PT "6 Clicks" Mobility   Outcome Measure  Help needed turning from your back to your side while in a flat bed without using bedrails?: A Lot Help needed moving from lying on your back to sitting on the side of a flat bed without using bedrails?: A Lot Help needed moving to and from a bed to a chair (including a wheelchair)?: A Lot Help needed standing up from a chair using your arms (e.g., wheelchair or bedside chair)?: A Lot Help needed to walk in hospital room?: Total Help needed climbing 3-5 steps with a railing? : Total 6 Click Score: 10    End of Session Equipment Utilized During Treatment: Gait belt Activity Tolerance: Patient limited by fatigue Patient left: in bed;with call bell/phone within reach;with nursing/sitter in room Nurse Communication: Mobility status PT Visit Diagnosis: Unsteadiness on feet (R26.81);Difficulty in walking, not elsewhere classified (R26.2);Repeated falls (R29.6)     Time: [id number] PT Time Calculation (min) (ACUTE ONLY): 38 min  Charges:  $Therapeutic Exercise: 8-22 mins $Therapeutic Activity: 23-37 mins                     [place] Pager [phone] Office [phone]    Nyanna Heideman 02/03/2020, 5:15 PM

## 2020-02-03 NOTE — Progress Notes (Signed)
Orthopaedic Trauma Progress Note  S:  Had some dizziness and hypotension when working with physical therapy yesterday morning, was transfused 2 units PRBCs.  Blood pressure improved after transfusion, was able to move around some with therapy.  Feeling okay this morning.  Denies any lightheadedness, dizziness, nausea.    O:  Vitals:   02/03/20 0129 02/03/20 0530  BP: (!) 101/53 (!) 109/59  Pulse: 93 89  Resp: 16 16  Temp: 98.8 F (37.1 C) 99.5 F (37.5 C)  SpO2: 96% 96%    General - Sitting up in bed, no acute distress.   Respiratory -  No increased work of breathing.  Right upper extremity - Sling in place.  Dressing over shoulder is clean, dry, and intact.  Bruising noted throughout shoulder and upper arm.  Swelling throughout extremity.  Mild global tenderness with palpation of shoulder.  Nontender in the elbow or forearm.  Tolerates some elbow motion. Able to flex, extend, and abduct all fingers of right hand. Distal sensation intact.  Compartments remain soft and compressible.  Intact radial pulse.  Imaging: Stable post op imaging.   Labs:  Results for orders placed or performed during the hospital encounter of 01/29/20 (from the past 24 hour(s))  Type and screen [place]     Status: None (Preliminary result)   Collection Time: 02/02/20  9:42 AM  Result Value Ref Range   ABO/RH(D) O POS    Antibody Screen NEG    Sample Expiration 02/05/2020,2359    Unit Number [id number]    Blood Component Type RED CELLS,LR    Unit division 00    Status of Unit ISSUED    Transfusion Status OK TO TRANSFUSE    Crossmatch Result Compatible    Unit Number [id number]    Blood Component Type RED CELLS,LR    Unit division 00    Status of Unit ISSUED    Transfusion Status OK TO TRANSFUSE    Crossmatch Result      Compatible Performed at [facility], [place] [address], [place], [place] [address]   Prepare RBC     Status: None   Collection Time:  02/02/20  9:42 AM  Result Value Ref Range   Order Confirmation      ORDER PROCESSED BY BLOOD BANK Performed at [facility], [place] [address], [place], [place] [address]   ABO/Rh     Status: None   Collection Time: 02/02/20  9:42 AM  Result Value Ref Range   ABO/RH(D)      O POS Performed at [facility], [place] [address] [place], [place] [address]   Hemoglobin and hematocrit, blood     Status: Abnormal   Collection Time: 02/02/20  9:58 PM  Result Value Ref Range   Hemoglobin 7.9 (L) 12.0 - 15.0 g/dL   HCT 24.3 (L) 36.0 - 46.0 %  CBC     Status: Abnormal   Collection Time: 02/03/20  4:55 AM  Result Value Ref Range   WBC 7.9 4.0 - 10.5 K/uL   RBC 2.49 (L) 3.87 - 5.11 MIL/uL   Hemoglobin 7.5 (L) 12.0 - 15.0 g/dL   HCT 23.4 (L) 36.0 - 46.0 %   MCV 94.0 80.0 - 100.0 fL   MCH 30.1 26.0 - 34.0 pg   MCHC 32.1 30.0 - 36.0 g/dL   RDW 14.4 11.5 - 15.5 %   Platelets 181 150 - 400 K/uL   nRBC 0.0 0.0 - 0.2 %  Assessment: 74 year old female 1.  Right proximal humerus fracture s/p reverse shoulder arthroplasty on 01/29/2020 2.  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 Ninfa Linden   Weightbearing: NWB RUE, WBAT RLE   Insicional and dressing care: Continue to change dressings as needed  Orthopedic device(s): None   CV/Blood loss: Acute blood loss anemia, Hgb 7.5 this morning.  Redraw CBC now.  Received 2 units PRBCs yesterday.  Remains hemodynamically stable.  Continue to monitor CBC  Pain management:  1. Tylenol 325- 650 mg q 6 hours PRN 2. Robaxin 500 mg q 6 hours PRN 3. Oxycodone 5-15 mg q 4 hours PRN 4. Neurontin 100 mg TID 5. Dilaudid 0.5-1 mg q 4 hours PRN 6. Morphine 0.5-1 mg q 2 hours PRN  VTE prophylaxis: Aspirin 81 mg BID SCDs: Ordered. Not currently on, legs get hot. Will be re-applied later todday  ID: Ancef completed  Foley/Lines: Foley in place, will be removed today.  Dispo: Not ready for  discharge today.  Patient with continued anemia. Monitor CBC today, may need additional transfusion.   Follow - up plan: 2 weeks with Dr. Preston Fleeting information:  Katha Hamming MD, Patrecia Pace PA-C   Mattalynn Crandle A. [facility] [phone] (office) [email or web address]

## 2020-02-03 NOTE — Progress Notes (Signed)
Subjective: 2 Days Post-Op Procedure(s) (LRB): TOTAL HIP ARTHROPLASTY ANTERIOR APPROACH (Right) HARDWARE REMOVAL (Right) Patient reports pain as mild.  C/o fatigue and weakness.  No lightheadedness/dizziness, chest pain/pressure/palpitations/sob.  Was transfused with one unit of blood yesterday.  Hemoglobin stable today.     Objective: Vital signs in last 24 hours: Temp:  [98.8 F (37.1 C)-100.3 F (37.9 C)] 99.5 F (37.5 C) (02/28 0530) Pulse Rate:  [65-101] 89 (02/28 0530) Resp:  [16] 16 (02/28 0530) BP: (93-112)/(49-86) 109/59 (02/28 0530) SpO2:  [87 %-96 %] 96 % (02/28 0530)  Intake/Output from previous day: 02/27 0701 - 02/28 0700 In: 1517.6 [P.O.:480; I.V.:306.4; Blood:731.2] Out: [id number] [Urine:1450] Intake/Output this shift: Total I/O In: 240 [P.O.:240] Out: 400 [Urine:400]  Recent Labs    02/01/20 1509 02/02/20 0343 02/02/20 2158 02/03/20 0455 02/03/20 0901  HGB 9.3* 7.6* 7.9* 7.5* 7.6*   Recent Labs    02/03/20 0455 02/03/20 0901  WBC 7.9 10.0  RBC 2.49* 2.49*  HCT 23.4* 22.7*  PLT 181 194   Recent Labs    02/02/20 0343  NA 137  K 4.7  CL 104  CO2 26  BUN 18  CREATININE 0.66  GLUCOSE 120*  CALCIUM 8.6*   No results for input(s): LABPT, INR in the last 72 hours.  Neurologically intact Neurovascular intact Sensation intact distally Intact pulses distally Dorsiflexion/Plantar flexion intact Incision: dressing C/D/I No cellulitis present Compartment soft   Assessment/Plan: 2 Days Post-Op Procedure(s) (LRB): TOTAL HIP ARTHROPLASTY ANTERIOR APPROACH (Right) HARDWARE REMOVAL (Right) Up with therapy WBAT RLE ABLA- transfused with one unit prbc yesterday.  Hemoglobin somewhat stable today.  BP improving.  Only complaint is fatigue/weakness.  Will continue to monitor and transfuse with one more unit if becomes more symptomatic of if hemoglobin starts trending down      Aundra Dubin 02/03/2020, 9:37 AM

## 2020-02-04 ENCOUNTER — Encounter: Payer: Self-pay | Admitting: *Deleted

## 2020-02-04 LAB — CBC
HCT: 22.8 % — ABNORMAL LOW (ref 36.0–46.0)
Hemoglobin: 7 g/dL — ABNORMAL LOW (ref 12.0–15.0)
MCH: 29.7 pg (ref 26.0–34.0)
MCHC: 30.7 g/dL (ref 30.0–36.0)
MCV: 96.6 fL (ref 80.0–100.0)
Platelets: 192 10*3/uL (ref 150–400)
RBC: 2.36 MIL/uL — ABNORMAL LOW (ref 3.87–5.11)
RDW: 13.9 % (ref 11.5–15.5)
WBC: 10.2 10*3/uL (ref 4.0–10.5)
nRBC: 0 % (ref 0.0–0.2)

## 2020-02-04 LAB — PREPARE RBC (CROSSMATCH)

## 2020-02-04 MED ORDER — SODIUM CHLORIDE 0.9% IV SOLUTION: Freq: Once | INTRAVENOUS | Status: DC

## 2020-02-04 MED ORDER — FUROSEMIDE 10 MG/ML IJ SOLN
20.0000 mg | Freq: Once | INTRAMUSCULAR | Status: AC
  Administered 2020-02-04: 20 mg via INTRAVENOUS
  Filled 2020-02-04: qty 2

## 2020-02-04 NOTE — Progress Notes (Signed)
Physical Therapy Treatment Patient Details Name: Emily Horton MRN: [id number] DOB: 1946/07/05 Today's Date: 02/04/2020    History of Present Illness Emily Horton is a 74 y.o. female who fell pushing garbage can on 01/27/20 and sustained right proximal humerus fracture. 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 S/P THA through direct anterior approach and retained  hardware/IMN    PT Comments    The patient is progressing in assisting with mobility. Stood  X 2 from bed and beginning to move feet to side step/scoot along bed edge. Continue  Progressive mobilty.     Follow Up Recommendations  SNF     Equipment Recommendations  None recommended by PT    Recommendations for Other Services       Precautions / Restrictions Precautions Precautions: Fall Precaution Comments: very painful right thigh/hip Required Braces or Orthoses: Sling Restrictions RUE Weight Bearing: Non weight bearing RLE Weight Bearing: Weight bearing as tolerated    Mobility  Bed Mobility Overal bed mobility: Needs Assistance Bed Mobility: Supine to Sit;Sit to Supine     Supine to sit: Mod assist;+2 for physical assistance;+2 for safety/equipment Sit to supine: Max assist   General bed mobility comments: Increased time with cues for sequence and use of L LE to self assist, use of bed rail to turn towards right side, PT supported right leg, assist to pull to sit up. Marland Kitchen assist right leg back onto bed.  Transfers Overall transfer level: Needs assistance Equipment used: Rolling walker (2 wheeled) Transfers: Sit to/from Stand Sit to Stand: +2 physical assistance;Min assist;Mod assist;From elevated surface   patient stood from bed x 2 at RW holding onto with left hand only, cues to now try using right hand.         General transfer comment: cues for LE management and use of L UE to pull to stand.  Patient able to stand up on second  trial with much less asssitance. Patient attempted to scoot the  right leg to right, assistance to lift leg to step, scooted on the left foot to move along the bed edge.  Ambulation/Gait                 Stairs             Wheelchair Mobility    Modified Rankin (Stroke Patients Only)       Balance                                            Cognition Arousal/Alertness: Awake/alert Behavior During Therapy: Anxious                                 Problem Solving: Slow processing General Comments: appears to be more on track this visit.      Exercises Total Joint Exercises Ankle Circles/Pumps: AROM;Both;15 reps;Supine Heel Slides: AAROM;Right;20 reps;Supine Hip ABduction/ADduction: AAROM;Right;15 reps;Supine RUE removed hand from sling, performed active elbow extension and flexion and supination/pronation.   General Comments        Pertinent Vitals/Pain Faces Pain Scale: Hurts even more Pain Location: R thigh Pain Descriptors / Indicators: Discomfort;Cramping;Burning Pain Intervention(s): Monitored during session;Premedicated before session;Ice applied;Limited activity within patient's tolerance    Home Living  Prior Function            PT Goals (current goals can now be found in the care plan section) Progress towards PT goals: Progressing toward goals    Frequency    Min 3X/week      PT Plan Current plan remains appropriate    Co-evaluation              AM-PAC PT "6 Clicks" Mobility   Outcome Measure  Help needed turning from your back to your side while in a flat bed without using bedrails?: A Lot Help needed moving from lying on your back to sitting on the side of a flat bed without using bedrails?: A Lot Help needed moving to and from a bed to a chair (including a wheelchair)?: A Lot Help needed standing up from a chair using your arms (e.g., wheelchair or bedside  chair)?: A Lot Help needed to walk in hospital room?: Total Help needed climbing 3-5 steps with a railing? : Total 6 Click Score: 10    End of Session Equipment Utilized During Treatment: Gait belt Activity Tolerance: Patient tolerated treatment well Patient left: in bed;with call bell/phone within reach Nurse Communication: Mobility status PT Visit Diagnosis: Unsteadiness on feet (R26.81);Difficulty in walking, not elsewhere classified (R26.2);Repeated falls (R29.6)     Time: [id number] PT Time Calculation (min) (ACUTE ONLY): 44 min  Charges:  $Therapeutic Exercise: 8-22 mins $Therapeutic Activity: 23-37 mins                     Emily Horton PT Acute Rehabilitation Services Pager [phone] Office [phone]'    Emily Horton 02/04/2020, 3:18 PM

## 2020-02-04 NOTE — Progress Notes (Signed)
350 mL of amber/clear urine out total for night shift.

## 2020-02-04 NOTE — Progress Notes (Signed)
     Subjective:  Patient reports pain as mild.  She does report some lightheadedness to me.  She has had 2 units so far over the weekend, to the best that we could interpret the epic nursing documentation.  She has been slow to progress with physical therapy.  Objective:   VITALS:   Vitals:   02/03/20 0530 02/03/20 1016 02/03/20 2043 02/04/20 0548  BP: (!) 109/59 108/62 (!) 115/52 109/64  Pulse: 89 88 89 84  Resp: 16 16 16    Temp: 99.5 F (37.5 C) 99.8 F (37.7 C) 98.3 F (36.8 C) 98.7 F (37.1 C)  TempSrc: Oral Oral  Oral  SpO2: 96% 95% 99% 93%  Weight:      Height:        Neurologically intact Dorsiflexion/Plantar flexion intact Incision: scant drainage Right shoulder dressing is clean and intact, deltoid seemed to be firing, sensation intact distally.    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  Plan for additional transfusion, discussed this with Dr. Ninfa Linden.  She has significant care needs in the postacute setting, given that she is nonweightbearing on her right upper extremity, and is just had a revision hip surgery as well as complex shoulder reconstruction.  She may need skilled nursing placement versus  inpatient rehab if she qualifies, final disposition is pending.  Anticipated LOS equal to or greater than 2 midnights due to - Age 45 and older with one or more of the following:  - Obesity  - Expected need for hospital services (PT, OT, Nursing) required for safe  discharge  - Anticipated need for postoperative skilled nursing care or inpatient rehab  - Active co-morbidities: Anemia OR   - Unanticipated findings during/Post Surgery: Slow post-op progression: GI, pain control, mobility  - Patient is a high risk of re-admission due to: She is status post shoulder replacement and hip replacement, with complex functional limitations.     Johnny Bridge 02/04/2020, 7:41 AM   Marchia Bond, MD Cell [phone]

## 2020-02-04 NOTE — Progress Notes (Signed)
Patient ID: Emily Horton, female   DOB: [birth year], 74 y.o.   MRN: [id number] The patient feels fine this morning.  However, her hemoglobin is down to 7.0.  Her vital signs seems stable but she has dark urine output.  I have spoken to the patient in length.  I will also talk with Dr. Mardelle Matte.  Our plan will be to transfuse 2 more units of blood today and check an H&H again tomorrow.  The family is considering skilled nursing for the patient versus home care with significant supervision which will be needed.  On examination of her right thigh, her dressings are clean and dry.  There is no significant swelling.  We will continue to monitor closely.

## 2020-02-05 LAB — TYPE AND SCREEN
ABO/RH(D): O POS
Antibody Screen: NEGATIVE
Unit division: 0
Unit division: 0
Unit division: 0
Unit division: 0

## 2020-02-05 LAB — BPAM RBC
Blood Product Expiration Date: 202103302359
Blood Product Expiration Date: 202103302359
Blood Product Expiration Date: 202103312359
Blood Product Expiration Date: 202104032359
ISSUE DATE / TIME: 202102271331
ISSUE DATE / TIME: 202102271639
ISSUE DATE / TIME: 202103011040
ISSUE DATE / TIME: 202103011500
Unit Type and Rh: 5100
Unit Type and Rh: 5100
Unit Type and Rh: 5100
Unit Type and Rh: 5100

## 2020-02-05 LAB — CBC
HCT: 28.5 % — ABNORMAL LOW (ref 36.0–46.0)
Hemoglobin: 9.2 g/dL — ABNORMAL LOW (ref 12.0–15.0)
MCH: 30.5 pg (ref 26.0–34.0)
MCHC: 32.3 g/dL (ref 30.0–36.0)
MCV: 94.4 fL (ref 80.0–100.0)
Platelets: 229 10*3/uL (ref 150–400)
RBC: 3.02 MIL/uL — ABNORMAL LOW (ref 3.87–5.11)
RDW: 13.9 % (ref 11.5–15.5)
WBC: 9.7 10*3/uL (ref 4.0–10.5)
nRBC: 0 % (ref 0.0–0.2)

## 2020-02-05 NOTE — Progress Notes (Signed)
Patient ID: Emily Horton, female   DOB: 02-12-1946, 74 y.o.   MRN: [id number] I have not seen Ms. Ivanov yet today but I have reviewed her chart.  Her vital signs are stable and her hemoglobin/hematocrit responded appropriately with a transfusion yesterday.  This morning her hemoglobin is 9.2.  According to notes from therapy they are still recommending short-term skilled nursing placement.  The transitional care team has been consulted.  I am not sure if she would qualify from inpatient rehab but certainly that can be looked into.  She will continue to mobilize with weightbearing as tolerated on her right lower extremity.

## 2020-02-05 NOTE — Anesthesia Postprocedure Evaluation (Signed)
Anesthesia Post Note  Patient: Emily Horton  Procedure(s) Performed: TOTAL HIP ARTHROPLASTY ANTERIOR APPROACH (Right Hip) HARDWARE REMOVAL (Right Hip)     Patient location during evaluation: PACU Anesthesia Type: General Level of consciousness: sedated Pain management: pain level controlled Vital Signs Assessment: post-procedure vital signs reviewed and stable Respiratory status: spontaneous breathing Cardiovascular status: stable Postop Assessment: no apparent nausea or vomiting Anesthetic complications: no    Last Vitals:  Vitals:   02/04/20 2008 02/05/20 0631  BP: (!) 103/59 (!) 129/50  Pulse: 81 76  Resp: 18 18  Temp: 36.8 C 36.7 C  SpO2: 99% 97%    Last Pain:  Vitals:   02/05/20 1000  TempSrc:   PainSc: 7    Pain Goal: Patients Stated Pain Goal: 2 (02/05/20 0156)                 Huston Foley

## 2020-02-05 NOTE — Plan of Care (Signed)
  Problem: Activity: Goal: Ability to avoid complications of mobility impairment will improve Outcome: Progressing Goal: Ability to tolerate increased activity will improve Outcome: Progressing   Problem: Clinical Measurements: Goal: Postoperative complications will be avoided or minimized Outcome: Progressing   Problem: Pain Management: Goal: Pain level will decrease with appropriate interventions Outcome: Progressing   Problem: Skin Integrity: Goal: Will show signs of wound healing Outcome: Progressing   Problem: Clinical Measurements: Goal: Diagnostic test results will improve Outcome: Progressing Goal: Respiratory complications will improve Outcome: Progressing Goal: Cardiovascular complication will be avoided Outcome: Progressing   Problem: Nutrition: Goal: Adequate nutrition will be maintained Outcome: Progressing   Problem: Elimination: Goal: Will not experience complications related to bowel motility Outcome: Progressing

## 2020-02-05 NOTE — TOC Progression Note (Signed)
Transition of Care [facility]) - Progression Note    Patient Details  Name: Emily Horton MRN: [id number] Date of Birth: January 31, 1946  Transition of Care [facility]) CM/SW [place], [place] Phone Number: 02/05/2020, 1:26 PM  Clinical Narrative:    Patient daughter chose [facility] and has been actively working with the Admission Coordinator Katie to provide requested documentation. CSW reached out to [facility] for bed confirmation. CSW waiting for a response.  The patient will need an updated covid test. Nurse to page physician.    Expected Discharge Plan: [place] Barriers to Discharge: Continued Medical Work up  Expected Discharge Plan and Services Expected Discharge Plan: [place]   Discharge Planning Services: CM Consult Post Acute Care Choice: [place] Living arrangements for the past 2 months: Single Family Home                                       Social Determinants of Health (SDOH) Interventions    Readmission Risk Interventions No flowsheet data found.

## 2020-02-05 NOTE — Care Management Important Message (Signed)
Important Message  Patient Details IM Letter given to [name] SW Case Manager to present to the Patient Name: Emily Horton MRN: [id number] Date of Birth: 05-20-1946   Medicare Important Message Given:  Yes     [name] 02/05/2020, 11:02 AM

## 2020-02-05 NOTE — Progress Notes (Signed)
Physical Therapy Treatment Patient Details Name: Emily Horton MRN: [id number] DOB: [birth year] Today's Date: 02/05/2020    History of Present Illness Emily Horton is a 74 y.o. female who fell pushing garbage can on 01/27/20 and sustained right proximal humerus fracture. 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 S/P THA through direct anterior approach and retained  hardware/IMN    PT Comments    The patient assisted self   With moving legs towards left bed edge. Patient used bed rail, extra time to  Push self up to sitting, assisting with pelvis, using bed pad. Patient transferred to recliner  To have bed changed and assisted  To stand and pivot back to bed. Continue Mobility as tolerated.   Follow Up Recommendations  SNF     Equipment Recommendations  None recommended by PT    Recommendations for Other Services       Precautions / Restrictions Precautions Precautions: Fall Precaution Comments: very painful right thigh/hip Required Braces or Orthoses: Sling Restrictions Weight Bearing Restrictions: Yes RUE Weight Bearing: Non weight bearing RLE Weight Bearing: Weight bearing as tolerated Other Position/Activity Restrictions: sling at all times, AROM of R elbow, wrist, and hand only     Mobility  Bed Mobility   Bed Mobility: Supine to Sit;Sit to Supine     Supine to sit: Mod assist;+2 for physical assistance;+2 for safety/equipment Sit to supine: Max assist   General bed mobility comments: Increased time with cues for sequence and use of L LE to self assist, use of bed rail to turn towards left side, PT supported right leg, assist to pull to sit up. Marland Kitchen assist right leg back onto bed.  Transfers Overall transfer level: Needs assistance Equipment used: Rolling walker (2 wheeled) Transfers: Sit to/from Stand Sit to Stand: +2 physical assistance;Min assist;Mod assist;From elevated surface Stand pivot  transfers: Max assist;+2 physical assistance;+2 safety/equipment       General transfer comment: assist to rise and   steady at RW, . Scooted /stepped to the left with mod asisst. To return  back to bed, 2 assist to rise from recliner, required more assistance to move to the right back to bed, assit with the  right leg.  Ambulation/Gait                 Stairs             Wheelchair Mobility    Modified Rankin (Stroke Patients Only)       Balance Overall balance assessment: Needs assistance;History of Falls Sitting-balance support: Feet supported;Bilateral upper extremity supported Sitting balance-Leahy Scale: Fair     Standing balance support: During functional activity;Bilateral upper extremity supported Standing balance-Leahy Scale: Poor Standing balance comment: needs 1 UE for support at all times.                            Cognition Arousal/Alertness: Awake/alert Behavior During Therapy: Anxious                                          Exercises      General Comments        Pertinent Vitals/Pain Faces Pain Scale: Hurts whole lot Pain Location: R thigh Pain Descriptors / Indicators: Discomfort;Cramping;Burning Pain Intervention(s): Monitored during session;Premedicated before session  Home Living                      Prior Function            PT Goals (current goals can now be found in the care plan section)      Frequency    Min 3X/week      PT Plan Current plan remains appropriate    Co-evaluation              AM-PAC PT "6 Clicks" Mobility   Outcome Measure  Help needed turning from your back to your side while in a flat bed without using bedrails?: A Lot Help needed moving from lying on your back to sitting on the side of a flat bed without using bedrails?: A Lot Help needed moving to and from a bed to a chair (including a wheelchair)?: A Lot Help needed standing up from a chair  using your arms (e.g., wheelchair or bedside chair)?: A Lot Help needed to walk in hospital room?: Total Help needed climbing 3-5 steps with a railing? : Total 6 Click Score: 10    End of Session Equipment Utilized During Treatment: Gait belt Activity Tolerance: Patient tolerated treatment well Patient left: in bed;with call bell/phone within reach;with family/visitor present Nurse Communication: Mobility status PT Visit Diagnosis: Unsteadiness on feet (R26.81);Difficulty in walking, not elsewhere classified (R26.2);Repeated falls (R29.6)     Time: [id number] PT Time Calculation (min) (ACUTE ONLY): 35 min  Charges:  $Therapeutic Activity: 23-37 mins                     Tresa Endo PT Acute Rehabilitation Services Pager [phone] Office [phone]    Claretha Cooper 02/05/2020, 4:19 PM

## 2020-02-05 NOTE — Progress Notes (Addendum)
     Subjective: 4 Days Post-Op s/p Procedure(s): TOTAL HIP ARTHROPLASTY ANTERIOR APPROACH HARDWARE REMOVAL 7 days post-op right reverse total shoulder arthroplasty   Patient is alert, oriented. Tearful this morning as she feels she's been through a lot physically and mentally but able to be consoled. States pain was severe prior to my exam but that she recently received pain medication and pain is only mild at that point. Pain is located in both right hip and right shoulder. Denies fatigue, chest pain, shortness of breath, nausea, or vomiting.    Objective:  PE: VITALS:   Vitals:   02/04/20 1541 02/04/20 1804 02/04/20 2008 02/05/20 0631  BP: 122/68 111/63 (!) 103/59 (!) 129/50  Pulse: 80 82 81 76  Resp: 16 16 18 18   Temp: 97.9 F (36.6 C) 98.8 F (37.1 C) 98.3 F (36.8 C) 98.1 F (36.7 C)  TempSrc: Oral Oral Oral Oral  SpO2: 97% 96% 99% 97%  Weight:      Height:       General: patient lying comfortably in bed, in no acute distress Resp: no use of accessory musculature Abd: Soft, non-tender MSK: RUE - Right arm in sling. Surgical dressings appear dry and in place with scant drainage. Able to flex, extend, and abduct all fingers of right hand. Distal sensation intact. 2+ radial pulse. RLE - EHL and FHL intact, distal sensation intact. Surgical dressings intact with scant drainage.  LABS  Results for orders placed or performed during the hospital encounter of 01/29/20 (from the past 24 hour(s))  CBC     Status: Abnormal   Collection Time: 02/05/20  3:24 AM  Result Value Ref Range   WBC 9.7 4.0 - 10.5 K/uL   RBC 3.02 (L) 3.87 - 5.11 MIL/uL   Hemoglobin 9.2 (L) 12.0 - 15.0 g/dL   HCT 28.5 (L) 36.0 - 46.0 %   MCV 94.4 80.0 - 100.0 fL   MCH 30.5 26.0 - 34.0 pg   MCHC 32.3 30.0 - 36.0 g/dL   RDW 13.9 11.5 - 15.5 %   Platelets 229 150 - 400 K/uL   nRBC 0.0 0.0 - 0.2 %    No results found.  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 Hbg 9.2 this morning.   Weightbearing: NWB RUE, WBAT RLE - up with therapy Insicional and dressing care: Prn dressing changes VTE prophylaxis: Aspirin 81 mg BID Pain control: Continue current Prn pain control regimen Follow - up plan: Follow up with Dr. Mardelle Matte in 2 weeks Dispo: PT recommending SNF on discharge.  Contact information:   Weekdays 8-5 [address], [place] [phone] A fter hours and holidays please check [email or web address] for group call information for [facility]  Ventura Bruns 02/05/2020, 12:18 PM

## 2020-02-06 DIAGNOSIS — D649 Anemia, unspecified: Secondary | ICD-10-CM | POA: Diagnosis not present

## 2020-02-06 DIAGNOSIS — F339 Major depressive disorder, recurrent, unspecified: Secondary | ICD-10-CM | POA: Diagnosis not present

## 2020-02-06 DIAGNOSIS — R41841 Cognitive communication deficit: Secondary | ICD-10-CM | POA: Diagnosis not present

## 2020-02-06 DIAGNOSIS — S42201D Unspecified fracture of upper end of right humerus, subsequent encounter for fracture with routine healing: Secondary | ICD-10-CM | POA: Diagnosis not present

## 2020-02-06 DIAGNOSIS — M62838 Other muscle spasm: Secondary | ICD-10-CM | POA: Diagnosis not present

## 2020-02-06 DIAGNOSIS — K5730 Diverticulosis of large intestine without perforation or abscess without bleeding: Secondary | ICD-10-CM | POA: Diagnosis not present

## 2020-02-06 DIAGNOSIS — M25511 Pain in right shoulder: Secondary | ICD-10-CM | POA: Diagnosis not present

## 2020-02-06 DIAGNOSIS — W19XXXA Unspecified fall, initial encounter: Secondary | ICD-10-CM | POA: Diagnosis not present

## 2020-02-06 DIAGNOSIS — F419 Anxiety disorder, unspecified: Secondary | ICD-10-CM | POA: Diagnosis not present

## 2020-02-06 DIAGNOSIS — R109 Unspecified abdominal pain: Secondary | ICD-10-CM | POA: Diagnosis not present

## 2020-02-06 DIAGNOSIS — M2550 Pain in unspecified joint: Secondary | ICD-10-CM | POA: Diagnosis not present

## 2020-02-06 DIAGNOSIS — M25551 Pain in right hip: Secondary | ICD-10-CM | POA: Diagnosis not present

## 2020-02-06 DIAGNOSIS — R079 Chest pain, unspecified: Secondary | ICD-10-CM | POA: Diagnosis not present

## 2020-02-06 DIAGNOSIS — S72141D Displaced intertrochanteric fracture of right femur, subsequent encounter for closed fracture with routine healing: Secondary | ICD-10-CM | POA: Diagnosis not present

## 2020-02-06 DIAGNOSIS — K581 Irritable bowel syndrome with constipation: Secondary | ICD-10-CM | POA: Diagnosis not present

## 2020-02-06 DIAGNOSIS — R1312 Dysphagia, oropharyngeal phase: Secondary | ICD-10-CM | POA: Diagnosis not present

## 2020-02-06 DIAGNOSIS — L089 Local infection of the skin and subcutaneous tissue, unspecified: Secondary | ICD-10-CM | POA: Diagnosis not present

## 2020-02-06 DIAGNOSIS — Z96611 Presence of right artificial shoulder joint: Secondary | ICD-10-CM | POA: Diagnosis not present

## 2020-02-06 DIAGNOSIS — Z7401 Bed confinement status: Secondary | ICD-10-CM | POA: Diagnosis not present

## 2020-02-06 DIAGNOSIS — D500 Iron deficiency anemia secondary to blood loss (chronic): Secondary | ICD-10-CM | POA: Diagnosis not present

## 2020-02-06 DIAGNOSIS — M6281 Muscle weakness (generalized): Secondary | ICD-10-CM | POA: Diagnosis not present

## 2020-02-06 DIAGNOSIS — J3089 Other allergic rhinitis: Secondary | ICD-10-CM | POA: Diagnosis not present

## 2020-02-06 DIAGNOSIS — Z23 Encounter for immunization: Secondary | ICD-10-CM | POA: Diagnosis not present

## 2020-02-06 DIAGNOSIS — J159 Unspecified bacterial pneumonia: Secondary | ICD-10-CM | POA: Diagnosis not present

## 2020-02-06 DIAGNOSIS — F329 Major depressive disorder, single episode, unspecified: Secondary | ICD-10-CM | POA: Diagnosis not present

## 2020-02-06 DIAGNOSIS — K219 Gastro-esophageal reflux disease without esophagitis: Secondary | ICD-10-CM | POA: Diagnosis not present

## 2020-02-06 DIAGNOSIS — J189 Pneumonia, unspecified organism: Secondary | ICD-10-CM | POA: Diagnosis not present

## 2020-02-06 DIAGNOSIS — Z96641 Presence of right artificial hip joint: Secondary | ICD-10-CM | POA: Diagnosis not present

## 2020-02-06 DIAGNOSIS — Z03818 Encounter for observation for suspected exposure to other biological agents ruled out: Secondary | ICD-10-CM | POA: Diagnosis not present

## 2020-02-06 DIAGNOSIS — R52 Pain, unspecified: Secondary | ICD-10-CM | POA: Diagnosis not present

## 2020-02-06 DIAGNOSIS — Z7389 Other problems related to life management difficulty: Secondary | ICD-10-CM | POA: Diagnosis not present

## 2020-02-06 DIAGNOSIS — R2681 Unsteadiness on feet: Secondary | ICD-10-CM | POA: Diagnosis not present

## 2020-02-06 DIAGNOSIS — D509 Iron deficiency anemia, unspecified: Secondary | ICD-10-CM | POA: Diagnosis not present

## 2020-02-06 DIAGNOSIS — Z792 Long term (current) use of antibiotics: Secondary | ICD-10-CM | POA: Diagnosis not present

## 2020-02-06 LAB — SARS CORONAVIRUS 2 (TAT 6-24 HRS): SARS Coronavirus 2: NEGATIVE

## 2020-02-06 MED ORDER — ASPIRIN 81 MG PO CHEW: 81.0000 mg | CHEWABLE_TABLET | Freq: Two times a day (BID) | ORAL | 0 refills | Status: DC

## 2020-02-06 MED ORDER — OXYCODONE HCL 5 MG PO TABS: 5.0000 mg | ORAL_TABLET | ORAL | 0 refills | Status: DC | PRN

## 2020-02-06 MED ORDER — METHOCARBAMOL 500 MG PO TABS: 500.0000 mg | ORAL_TABLET | Freq: Four times a day (QID) | ORAL | 0 refills | Status: DC | PRN

## 2020-02-06 NOTE — Progress Notes (Signed)
Patient ID: Emily Horton, female   DOB: [birth year], 74 y.o.   MRN: [id number] No acute changes. She is in a good mood this am and is very motivated.  Her vitals are stable and her right shoulder and right hip are stable.  Can be discharged to skilled nursing as soon as today if bed available.  Will have nursing change her dressings today.

## 2020-02-06 NOTE — Progress Notes (Signed)
Occupational Therapy Treatment Patient Details Name: Emily Horton MRN: [id number] DOB: Jun 23, 1946 Today's Date: 02/06/2020    History of present illness Emily Horton is a 74 y.o. female who fell pushing garbage can on 01/27/20 and sustained right proximal humerus fracture. 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 S/P THA through direct anterior approach and retained  hardware/IMN   OT comments  Patient required educational reminder on shoulder positioning while sitting in recliner and in bed. Patient educated on use of pillows for support to assist with reducing swelling and AROM exercises of wrist, elbow, and hand. Patient required Moderate Assist for supine to sit with extra time. Patient was able to complete [facility] transfer with Moderate assist x 2 with rolling walker. Patient able to follow NWB precautions of R UE during mobilization than previous sessions. Patient given a demo of the use of Adaptive equipment to assist with LB dressing. Patient will benefit from continued skilled acute OT services.   Follow Up Recommendations  SNF    Equipment Recommendations  None recommended by OT    Recommendations for Other Services      Precautions / Restrictions Precautions Precautions: Fall Precaution Comments: very painful right thigh/hip Restrictions Weight Bearing Restrictions: Yes RUE Weight Bearing: Non weight bearing RLE Weight Bearing: Weight bearing as tolerated       Mobility Bed Mobility                  Transfers   Equipment used: Rolling walker (2 wheeled) Transfers: Sit to/from Stand Sit to Stand: Min assist;+2 physical assistance Stand pivot transfers: Mod assist;+2 physical assistance;+2 safety/equipment            Balance   Sitting-balance support: Feet supported;Bilateral upper extremity supported Sitting balance-Leahy Scale: Good       Standing balance-Leahy Scale: Poor                             ADL either performed or assessed with clinical judgement   ADL                                               Vision       Perception     Praxis      Cognition Arousal/Alertness: Awake/alert Behavior During Therapy: Anxious Overall Cognitive Status: No family/caregiver present to determine baseline cognitive functioning Area of Impairment: Memory;Problem solving;Orientation                                        Exercises Exercises: General Upper Extremity General Exercises - Upper Extremity Elbow Flexion: AROM;10 reps Elbow Extension: AROM;10 reps Wrist Flexion: AROM;10 reps Wrist Extension: AROM;10 reps Hand Exercises Forearm Supination: AROM;10 reps Forearm Pronation: AROM;10 reps   Shoulder Instructions Shoulder Instructions Donning/doffing sling/immobilizer: Moderate assistance Correct positioning of sling/immobilizer: Minimal assistance ROM for elbow, wrist and digits of operated UE: Supervision/safety Sling wearing schedule (on at all times/off for ADL's): Supervision/safety Positioning of UE while sleeping: Moderate assistance     General Comments swelling noted with R UE/LE    Pertinent Vitals/ Pain       Pain Assessment: No/denies pain Pain Score: 5  Faces Pain Scale: Hurts even more Pain Location: R thigh, R shoulder Pain Descriptors / Indicators: Discomfort  Home Living                                          Prior Functioning/Environment              Frequency           Progress Toward Goals  OT Goals(current goals can now be found in the care plan section)  Progress towards OT goals: Progressing toward goals     Plan Discharge plan remains appropriate    Co-evaluation                 AM-PAC OT "6 Clicks" Daily Activity     Outcome Measure   Help from another person eating meals?: A Little Help from another person taking care of  personal grooming?: A Little Help from another person toileting, which includes using toliet, bedpan, or urinal?: A Lot Help from another person bathing (including washing, rinsing, drying)?: A Lot Help from another person to put on and taking off regular upper body clothing?: A Lot Help from another person to put on and taking off regular lower body clothing?: A Lot 6 Click Score: 14    End of Session        Activity Tolerance Patient limited by pain   Patient Left in chair;with call bell/phone within reach;with nursing/sitter in room;with chair alarm set   Nurse Communication Mobility status        Time: 1000-1055 OT Time Calculation (min): 55 min  Charges: OT General Charges $OT Visit: 1 Visit OT Treatments $Self Care/Home Management : 23-37 mins $Therapeutic Activity: 8-22 mins $Therapeutic Exercise: 8-22 mins  Yosiel Thieme OTR/L    Adarian Bur 02/06/2020, 3:34 PM

## 2020-02-06 NOTE — Progress Notes (Signed)
     Subjective: 5 Days Post-Op s/p Procedure(s): TOTAL HIP ARTHROPLASTY ANTERIOR APPROACH HARDWARE REMOVAL 8 days post-op right reverse total shoulder arthroplasty   Patient is alert, oriented, laying comfortably in bed. Patient states pain is mild this morning. Denies chest pain, SOB, nausea, and vomiting. No other complaints. No changes overnight.    Objective:  PE: VITALS:   Vitals:   02/05/20 0631 02/05/20 1311 02/05/20 2223 02/06/20 0542  BP: (!) 129/50 117/66 131/66 136/66  Pulse: 76 83 81 73  Resp: 18  16 16   Temp: 98.1 F (36.7 C) 98.3 F (36.8 C) 99.1 F (37.3 C) 98 F (36.7 C)  TempSrc: Oral Oral Oral Oral  SpO2: 97% 99% 96% 95%  Weight:      Height:       General: patient lying comfortably in bed, in no acute distress Resp: no use of accessory musculature Abd: Soft, non-tender MSK: RUE - Right arm in sling. Surgical dressings appear dry and in place with scant drainage. Able to flex, extend, and abduct all fingers of right hand. Moderate swelling of right wrist. Distal sensation intact. 2+ radial pulse. RLE - EHL and FHL intact, distal sensation intact. Surgical dressings intact with scant drainage.  LABS  Results for orders placed or performed during the hospital encounter of 01/29/20 (from the past 24 hour(s))  SARS CORONAVIRUS 2 (TAT 6-24 HRS) Nasopharyngeal Nasopharyngeal Swab     Status: None   Collection Time: 02/05/20  7:31 PM   Specimen: Nasopharyngeal Swab  Result Value Ref Range   SARS Coronavirus 2 NEGATIVE NEGATIVE    No results found.  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 Mardelle Matte in 2 weeks Dispo: PT recommending SNF on discharge. [place] for discharge today to SNF. COVID test performed yesterday resulted negative.    Contact information:   Weekdays 8-5 [address], [place] [phone] A fter hours and holidays please check [email or web address] for group call information for [facility]  Ventura Bruns 02/06/2020, 8:25 AM

## 2020-02-06 NOTE — Discharge Instructions (Signed)
Diet: As you were doing prior to hospitalization   Shower:  May shower but keep the wounds dry, use an occlusive plastic wrap, NO SOAKING IN TUB.  If the bandage gets wet, change with a clean dry gauze.  If you have a splint on, leave the splint in place and keep the splint dry with a plastic bag.  Dressing:  You may change your dressing 3-5 days after surgery, unless you have a splint.  If you have a splint, then just leave the splint in place and we will change your bandages during your first follow-up appointment.    If you had hand or foot surgery, we will plan to remove your stitches in about 2 weeks in the office.  For all other surgeries, there are sticky tapes (steri-strips) on your wounds and all the stitches are absorbable.  Leave the steri-strips in place when changing your dressings, they will peel off with time, usually 2-3 weeks.  Activity:  Increase activity slowly as tolerated, but follow the weight bearing instructions below.  The rules on driving is that you can not be taking narcotics while you drive, and you must feel in control of the vehicle.    Weight Bearing:   No weight bearing in right arm. Continue to use sling until follow-up with Dr. Mardelle Matte.    To prevent constipation: you may use a stool softener such as -  Colace (over the counter) 100 mg by mouth twice a day  Drink plenty of fluids (prune juice may be helpful) and high fiber foods Miralax (over the counter) for constipation as needed.    Itching:  If you experience itching with your medications, try taking only a single pain pill, or even half a pain pill at a time.  You may take up to 10 pain pills per day, and you can also use benadryl over the counter for itching or also to help with sleep.   Precautions:  If you experience chest pain or shortness of breath - call 911 immediately for transfer to the hospital emergency department!!  If you develop a fever greater that 101 F, purulent drainage from wound,  increased redness or drainage from wound, or calf pain -- Call the office at [phone]                                                Follow- Up Appointment:  Please call for an appointment to be seen in 2 weeks [facility] - [phone]    You may put all of your weight on your right hip as comfort allows. The dressings on your right thigh can get wet in the shower. Full wright bearing as tolerated on the right hip.

## 2020-02-06 NOTE — TOC Progression Note (Addendum)
Transition of Care [facility]) - Progression Note    Patient Details  Name: Emily Horton MRN: [id number] Date of Birth: 02/11/1946  [facility] CM/SW [place], LCSW Phone Number: 02/06/2020, 12:07 PM  Clinical Narrative:    Facility ready to accept the patient.  [facility] will transport.  Daughter notified.  Nurse call report to: Hayden 36, "ask to speak to the nurse"  Expected Discharge Plan: Skilled Nursing Facility Barriers to Discharge: Continued Medical Work up  Expected Discharge Plan and Services Expected Discharge Plan: [place]   Discharge Planning Services: CM Consult Post Acute Care Choice: [place] Living arrangements for the past 2 months: Single Family Home Expected Discharge Date: 02/06/20                                     Social Determinants of Health (SDOH) Interventions    Readmission Risk Interventions No flowsheet data found.

## 2020-02-06 NOTE — Discharge Summary (Signed)
Patient ID: Emily Horton MRN: [id number] DOB/AGE: 1946-08-04 74 y.o.  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 Colon polyps 09-25-2007   Colonoscopy  . Depression   . Diverticulosis of colon (without mention of hemorrhage) 09-25-2007   Colonoscopy  . GERD (gastroesophageal reflux disease)   . Gynecological examination    sees Dr. Carren Rang   . Hyperlipidemia     Surgeries: Procedure(s): TOTAL HIP ARTHROPLASTY ANTERIOR APPROACH HARDWARE REMOVAL on 02/01/2020   Consultants: Treatment Team:  Mcarthur Rossetti, MD  Discharged Condition: Improved  Hospital Course: Emily Horton is an 74 y.o. female who was admitted 01/29/2020 for operative treatment ofClosed fracture of right proximal humerus. Patient has severe unremitting pain that affects sleep, daily activities, and work/hobbies. After pre-op clearance the patient was taken to the operating room on 02/01/2020 and underwent  Procedure(s): TOTAL HIP ARTHROPLASTY ANTERIOR APPROACH HARDWARE REMOVAL.    Patient was given perioperative antibiotics:  Anti-infectives (From admission, onward)   Start     Dose/Rate Route Frequency Ordered Stop   02/01/20 1800  ceFAZolin (ANCEF) IVPB 1 g/50 mL premix     1 g 100 mL/hr over 30 Minutes Intravenous Every 6 hours 02/01/20 1759 02/01/20 2341   02/01/20 0830  ceFAZolin (ANCEF) IVPB 2g/100 mL premix     2 g 200 mL/hr over 30 Minutes Intravenous On call to O.R. 02/01/20 0649 02/01/20 1013   01/29/20 2000  ceFAZolin (ANCEF) IVPB 1 g/50 mL premix     1 g 100 mL/hr over 30 Minutes Intravenous Every 6 hours 01/29/20 1829 01/30/20 1012   01/29/20 1115  ceFAZolin (ANCEF) IVPB 2g/100 mL premix      2 g 200 mL/hr over 30 Minutes Intravenous On call to O.R. 01/29/20 1112 01/29/20 1429       Patient was given sequential compression devices, early ambulation, and chemoprophylaxis to prevent DVT.  Patient benefited maximally from hospital stay and there were no complications.  Although the patient was here for her elective right shoulder replacement, she was experiencing significant right hip pain.  She had had previous hip surgery from intertrochanteric fracture in July 2020.  A CT scan was obtained while she was in the hospital and it showed a nonunion of the right hip fracture.  It was then requested during this hospitalization that she have an additional surgery which was removal of the hardware from her right hip and conversion to a total hip arthroplasty.  She did tolerate this well.  There was an episode of acute blood loss anemia that did require transfusions.  By the day of discharge she is mobilizing well with stable vital signs and her hemoglobin has responded appropriately.  It was felt that she could be discharged safely to skilled nursing for further convalescence and mobility as she participates in activities that will increase her mobility, her actives daily living and her quality of life.  Recent vital signs:  Patient Vitals for the past 24 hrs:  BP Temp Temp src Pulse Resp SpO2  02/06/20 0542 136/66 98 F (36.7 C) Oral 73 16 95 %  02/05/20 2223 131/66 99.1 F (37.3 C) Oral 81 16 96 %  02/05/20 1311 117/66 98.3 F (36.8 C) Oral 83 -  99 %     Recent laboratory studies:  Recent Labs    02/04/20 0418 02/05/20 0324  WBC 10.2 9.7  HGB 7.0* 9.2*  HCT 22.8* 28.5*  PLT 192 229     Discharge Medications:   Allergies as of 02/06/2020      Reactions   Eggs Or Egg-derived Products    Hives   Zoloft [sertraline Hcl]    Headaches       Medication List    STOP taking these medications   aspirin-acetaminophen-caffeine 250-250-65 MG tablet Commonly known as: EXCEDRIN  MIGRAINE   [employer]ys Extra Strength 500-325-65 MG Pack Generic drug: Aspirin-Acetaminophen-Caffeine     TAKE these medications   ADULT ONE DAILY GUMMIES PO Take 1 tablet by mouth daily.   aspirin 81 MG chewable tablet Chew 1 tablet (81 mg total) by mouth 2 (two) times daily.   buPROPion 300 MG 24 hr tablet Commonly known as: WELLBUTRIN XL TAKE 1 TABLET BY MOUTH EVERY MORNING What changed: when to take this   cetirizine 10 MG tablet Commonly known as: ZYRTEC TAKE 1 TABLET(10 MG) BY MOUTH DAILY What changed: See the new instructions.   EPINEPHrine 0.3 mg/0.3 mL Soaj injection Commonly known as: EPI-PEN Inject 0.3 mLs into the muscle daily as needed for anaphylaxis.   hyoscyamine 0.125 MG SL tablet Commonly known as: LEVSIN SL Place 1 tablet (0.125 mg total) under the tongue every 4 (four) hours as needed.   methocarbamol 500 MG tablet Commonly known as: ROBAXIN Take 1 tablet (500 mg total) by mouth every 6 (six) hours as needed for muscle spasms.   omeprazole 40 MG capsule Commonly known as: PRILOSEC TAKE 1 CAPSULE BY MOUTH TWICE DAILY What changed: when to take this   ondansetron 4 MG tablet Commonly known as: Zofran Take 1 tablet (4 mg total) by mouth every 8 (eight) hours as needed for nausea or vomiting.   oxyCODONE 5 MG immediate release tablet Commonly known as: Roxicodone Take 1-2 tablets (5-10 mg total) by mouth every 4 (four) hours as needed for severe pain. What changed: how much to take   PARoxetine 10 MG tablet Commonly known as: PAXIL Take 10 mg by mouth daily.   polyethylene glycol 17 g packet Commonly known as: MIRALAX / GLYCOLAX Take 17 g by mouth daily as needed for moderate constipation or severe constipation.   senna-docusate 8.6-50 MG tablet Commonly known as: Senokot-S Take 1 tablet by mouth 2 (two) times daily. What changed: Another medication with the same name was added. Make sure you understand how and when to take each.    sennosides-docusate sodium 8.6-50 MG tablet Commonly known as: SENOKOT-S Take 2 tablets by mouth daily. What changed: You were already taking a medication with the same name, and this prescription was added. Make sure you understand how and when to take each.   triamcinolone cream 0.1 % Commonly known as: KENALOG Apply 1 application topically 2 (two) times daily.   Vitamin D3 125 MCG (5000 UT) Tabs Take 5,000 Units by mouth daily.       Diagnostic Studies: DG Shoulder Right  Result Date: 01/27/2020 CLINICAL DATA:  74 year old female with fall and trauma to the right shoulder. EXAM: RIGHT SHOULDER - 2+ VIEW; RIGHT HUMERUS - 2+ VIEW; RIGHT ELBOW - COMPLETE 3+ VIEW COMPARISON:  Chest radiograph dated 06/21/2019. FINDINGS: There is a comminuted and mildly displaced fracture of the right humeral neck and head with extension into the articular surface of the humerus with glenoid. Evaluation  for right shoulder dislocation is limited. There is widening of the joint suggestive of effusion or hematoma. No other acute fracture identified. The soft tissues are grossly unremarkable. IMPRESSION: Comminuted, intra-articular, and displaced fracture of the right humeral head and neck. There may be associated degree of subluxation of the right shoulder. Electronically Signed   By: Anner Crete M.D.   On: 01/27/2020 20:34   DG Elbow Complete Right  Result Date: 01/27/2020 CLINICAL DATA:  74 year old female with fall and trauma to the right shoulder. EXAM: RIGHT SHOULDER - 2+ VIEW; RIGHT HUMERUS - 2+ VIEW; RIGHT ELBOW - COMPLETE 3+ VIEW COMPARISON:  Chest radiograph dated 06/21/2019. FINDINGS: There is a comminuted and mildly displaced fracture of the right humeral neck and head with extension into the articular surface of the humerus with glenoid. Evaluation for right shoulder dislocation is limited. There is widening of the joint suggestive of effusion or hematoma. No other acute fracture identified. The  soft tissues are grossly unremarkable. IMPRESSION: Comminuted, intra-articular, and displaced fracture of the right humeral head and neck. There may be associated degree of subluxation of the right shoulder. Electronically Signed   By: Anner Crete M.D.   On: 01/27/2020 20:34   CT SHOULDER RIGHT WO CONTRAST  Result Date: 01/28/2020 CLINICAL DATA:  [place] and fractured right shoulder. EXAM: CT OF THE UPPER RIGHT EXTREMITY WITHOUT CONTRAST TECHNIQUE: Multidetector CT imaging of the upper right extremity was performed according to the standard protocol. COMPARISON:  Radiographs 01/27/2020 FINDINGS: Complex comminuted humeral head and neck fractures. The transverse fracture through the humeral neck demonstrates comminution and mild impaction. Maximum displacement is 10.5 mm. There is also a vertical fracture through the greater tuberosity which becomes a head split fracture superiorly splitting the humeral head. Maximal displacement is 14 mm. The glenoid is intact. No acute glenoid fracture. Calcification along the glenoid suggesting chondrocalcinosis. The [facility] joint is intact. Moderate degenerative changes with calcified pannus. The acromion is type 2 in shape. No significant lateral downsloping or undersurface spurring. Grossly by CT the rotator cuff tendons are intact. The humeroacromial space is normal. The visualized right ribs are intact and the visualized right lung is grossly clear. Fairly significant inflammation/edema/fluid/hematoma involving the right shoulder muscles and subcutaneous tissues. IMPRESSION: 1. Complex comminuted humeral head and neck fractures as described above. Head split fracture component as detailed above. 2. No acute glenoid fracture. 3. Intact rotator cuff tendons. 4. Fairly significant inflammation/edema/fluid/hematoma involving the right shoulder muscles and subcutaneous tissues. Electronically Signed   By: Marijo Sanes M.D.   On: 01/28/2020 17:04   DG Pelvis Portable  Result  Date: 02/01/2020 CLINICAL DATA:  Right hip arthroplasty. EXAM: PORTABLE PELVIS 1-2 VIEWS COMPARISON:  CT 01/30/2020. FINDINGS: Total right hip replacement. Hardware intact. Anatomic alignment. Diffuse osteopenia and degenerative change. IMPRESSION: Total right hip arthroplasty.  Hardware intact.  Anatomic alignment. Electronically Signed   By: Marcello Moores  Register   On: 02/01/2020 15:00   CT HIP RIGHT WO CONTRAST  Result Date: 01/30/2020 CLINICAL DATA:  Right hip pain since surgery 6 months ago. EXAM: CT OF THE RIGHT HIP WITHOUT CONTRAST TECHNIQUE: Multidetector CT imaging of the right hip was performed according to the standard protocol. Multiplanar CT image reconstructions were also generated. COMPARISON:  CT right hip 11/23/2019. Radiographs 06/22/2019. FINDINGS: Bones/Joint/Cartilage Status post dynamic compression screw plate fixation of the right femur with a long intramedullary nail, the distal components of which are not imaged by this examination. The visualized hardware is intact. There is stable mild  lucency surrounding the proximal aspect of the intramedullary nail. There has been no change in the alignment of the comminuted intertrochanteric right femur fracture. Small areas of bridging bone are demonstrated anteriorly and laterally, although many of the fracture lines are still visible, consistent with incomplete healing. No evidence of acute fracture, dislocation or femoral head avascular necrosis. The visualized inferior right hemipelvis is intact. No significant right hip arthropathy, although there is a possible small joint effusion. Ligaments Suboptimally assessed by CT. Muscles and Tendons Unremarkable. Soft tissues Stable postsurgical changes in the lateral subcutaneous fat without focal fluid collection. IMPRESSION: 1. Stable appearance of the comminuted intertrochanteric right femur fracture post ORIF. The fracture demonstrates incomplete healing but stable alignment. 2. No evidence of acute  fracture, dislocation or femoral head avascular necrosis. Electronically Signed   By: Richardean Sale M.D.   On: 01/30/2020 20:22   DG Shoulder Right Port  Result Date: 01/29/2020 CLINICAL DATA:  Fall fracture EXAM: PORTABLE RIGHT SHOULDER COMPARISON:  CT January 28, 2020 FINDINGS: The patient is status post reverse total shoulder arthroplasty. No new periprosthetic lucency seen. The comminuted proximal humerus shaft fracture is seen. No AC joint widening. Mild overlying soft tissue swelling. IMPRESSION: Status post right shoulder total arthroplasty without acute hardware complication. Electronically Signed   By: Prudencio Pair M.D.   On: 01/29/2020 19:38   DG Humerus Right  Result Date: 01/27/2020 CLINICAL DATA:  74 year old female with fall and trauma to the right shoulder. EXAM: RIGHT SHOULDER - 2+ VIEW; RIGHT HUMERUS - 2+ VIEW; RIGHT ELBOW - COMPLETE 3+ VIEW COMPARISON:  Chest radiograph dated 06/21/2019. FINDINGS: There is a comminuted and mildly displaced fracture of the right humeral neck and head with extension into the articular surface of the humerus with glenoid. Evaluation for right shoulder dislocation is limited. There is widening of the joint suggestive of effusion or hematoma. No other acute fracture identified. The soft tissues are grossly unremarkable. IMPRESSION: Comminuted, intra-articular, and displaced fracture of the right humeral head and neck. There may be associated degree of subluxation of the right shoulder. Electronically Signed   By: Anner Crete M.D.   On: 01/27/2020 20:34   DG C-Arm 1-60 Min-No Report  Result Date: 02/01/2020 Fluoroscopy was utilized by the requesting physician.  No radiographic interpretation.   DG HIP OPERATIVE UNILAT WITH PELVIS RIGHT  Result Date: 02/01/2020 CLINICAL DATA:  Right hip arthroplasty. EXAM: OPERATIVE RIGHT HIP (WITH PELVIS IF PERFORMED) 5 VIEWS TECHNIQUE: Fluoroscopic spot image(s) were submitted for interpretation post-operatively.  COMPARISON:  CT 01/30/2020. FINDINGS: Total right hip replacement. Hardware intact. Anatomic alignment. Surgical screw noted in the distal femur. No acute bony abnormality identified. IMPRESSION: Total right hip replacement.  Hardware intact.  Anatomic alignment. Electronically Signed   By: Marcello Moores  Register   On: 02/01/2020 14:05    Disposition: Discharge disposition: 03-Skilled [place]    Marchia Bond, MD. Schedule an appointment as soon as possible for a visit in 2 weeks.   Specialty: Orthopedic Surgery Contact information: [place] [address] [phone]        Mcarthur Rossetti, MD. Schedule an appointment as soon as possible for a visit in 2 week(s).   Specialty: Orthopedic Surgery Contact information: [address] [place] [address] [phone]            Signed: Mcarthur Rossetti 02/06/2020, 8:16 AM

## 2020-02-06 NOTE — Progress Notes (Signed)
Physical Therapy Treatment Patient Details Name: Emily Horton MRN: [id number] DOB: 1946/05/13 Today's Date: 02/06/2020    History of Present Illness Emily Horton is a 74 y.o. female who fell pushing garbage can on 01/27/20 and sustained right proximal humerus fracture. 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 S/P THA through direct anterior approach and retained  hardware/IMN    PT Comments    Patient is making slow gains in bed mobility, standing and transfers. Plans SNF for rehab.   Follow Up Recommendations  SNF     Equipment Recommendations  None recommended by PT    Recommendations for Other Services       Precautions / Restrictions Precautions Precautions: Fall Precaution Comments: very painful right thigh/hip Required Braces or Orthoses: Sling Restrictions Weight Bearing Restrictions: Yes RUE Weight Bearing: Non weight bearing RLE Weight Bearing: Weight bearing as tolerated Other Position/Activity Restrictions: sling at all times, AROM of R elbow, wrist, and hand only     Mobility  Bed Mobility   Bed Mobility: Supine to Sit;Sit to Supine       Sit to supine: Max assist   General bed mobility comments: assist withn legs and trunk  Transfers Overall transfer level: Needs assistance Equipment used: Rolling walker (2 wheeled) Transfers: Sit to/from Stand Sit to Stand: Min assist;+2 physical assistance Stand pivot transfers: Mod assist;+2 physical assistance;+2 safety/equipment       General transfer comment: assist to rise and   steady at [name], . Scooted /stepped to the left with mod asisst  Ambulation/Gait                 Stairs             Wheelchair Mobility    Modified Rankin (Stroke Patients Only)       Balance   Sitting-balance support: Feet supported;Bilateral upper extremity supported Sitting balance-Leahy Scale: Good       Standing balance-Leahy  Scale: Poor                              Cognition Arousal/Alertness: Awake/alert Behavior During Therapy: Anxious Overall Cognitive Status: No family/caregiver present to determine baseline cognitive functioning Area of Impairment: Memory;Problem solving;Orientation                                      Exercises General Exercises - Upper Extremity Elbow Flexion: AROM;10 reps Elbow Extension: AROM;10 reps Wrist Flexion: AROM;10 reps Wrist Extension: AROM;10 reps Hand Exercises Forearm Supination: AROM;10 reps Forearm Pronation: AROM;10 reps Donning/doffing sling/immobilizer: Moderate assistance Correct positioning of sling/immobilizer: Minimal assistance ROM for elbow, wrist and digits of operated UE: Supervision/safety Sling wearing schedule (on at all times/off for ADL's): Supervision/safety Positioning of UE while sleeping: Moderate assistance    General Comments General comments (skin integrity, edema, etc.): swelling noted with R UE/LE      Pertinent Vitals/Pain Pain Assessment: No/denies pain Pain Score: 8  Faces Pain Scale: Hurts even more Pain Location: R thigh, R shoulder Pain Descriptors / Indicators: Discomfort;Burning Pain Intervention(s): Monitored during session;Premedicated before session    Home Living                      Prior Function            PT  Goals (current goals can now be found in the care plan section) Progress towards PT goals: Progressing toward goals    Frequency    Min 3X/week      PT Plan Current plan remains appropriate    Co-evaluation              AM-PAC PT "6 Clicks" Mobility   Outcome Measure  Help needed turning from your back to your side while in a flat bed without using bedrails?: A Lot Help needed moving from lying on your back to sitting on the side of a flat bed without using bedrails?: A Lot Help needed moving to and from a bed to a chair (including a wheelchair)?: A  Lot Help needed standing up from a chair using your arms (e.g., wheelchair or bedside chair)?: A Lot Help needed to walk in hospital room?: Total Help needed climbing 3-5 steps with a railing? : Total 6 Click Score: 10    End of Session Equipment Utilized During Treatment: Gait belt Activity Tolerance: Patient tolerated treatment well Patient left: in bed;with call bell/phone within reach;with family/visitor present Nurse Communication: Mobility status PT Visit Diagnosis: Unsteadiness on feet (R26.81);Difficulty in walking, not elsewhere classified (R26.2);Repeated falls (R29.6)     Time: 1145-1200 PT Time Calculation (min) (ACUTE ONLY): 15 min  Charges:  $Therapeutic Activity: 8-22 mins                     Tresa Endo PT Acute Rehabilitation Services Pager [phone] Office [phone]    Claretha Cooper 02/06/2020, 5:45 PM

## 2020-02-07 ENCOUNTER — Encounter: Payer: Self-pay | Admitting: Nurse Practitioner

## 2020-02-07 ENCOUNTER — Non-Acute Institutional Stay (SKILLED_NURSING_FACILITY): Payer: Medicare Other | Admitting: Nurse Practitioner

## 2020-02-07 DIAGNOSIS — J3089 Other allergic rhinitis: Secondary | ICD-10-CM | POA: Diagnosis not present

## 2020-02-07 DIAGNOSIS — M25551 Pain in right hip: Secondary | ICD-10-CM | POA: Diagnosis not present

## 2020-02-07 DIAGNOSIS — D500 Iron deficiency anemia secondary to blood loss (chronic): Secondary | ICD-10-CM

## 2020-02-07 DIAGNOSIS — M25511 Pain in right shoulder: Secondary | ICD-10-CM | POA: Diagnosis not present

## 2020-02-07 DIAGNOSIS — K581 Irritable bowel syndrome with constipation: Secondary | ICD-10-CM | POA: Diagnosis not present

## 2020-02-07 DIAGNOSIS — K219 Gastro-esophageal reflux disease without esophagitis: Secondary | ICD-10-CM

## 2020-02-07 DIAGNOSIS — F339 Major depressive disorder, recurrent, unspecified: Secondary | ICD-10-CM | POA: Diagnosis not present

## 2020-02-07 NOTE — Progress Notes (Signed)
Location:  [facility] Room Number: 34-A Place of Service:  SNF [phone]) Provider:  Jillane Po Darlina Rumpf, NP   Patient Care Team: Laurey Morale, MD as PCP - General  Extended Emergency Contact Information Primary Emergency Contact: [place] Phone: [phone] Mobile Phone: [phone] Relation: Son Secondary Emergency Contact: Rulon [place] Phone: [phone] Mobile Phone: [phone] Relation: Daughter  Code Status:  FULL CODE Goals of care: Advanced Directive information Advanced Directives 01/30/2020  Does Patient Have a Medical Advance Directive? -  Type of Advance Directive -  Does patient want to make changes to medical advance directive? No - Patient declined  Copy of [place] in Chart? -  Would patient like information on creating a medical advance directive? -     Chief Complaint  Patient presents with  . Acute Visit    Patient is seen for constipation, cough, and right shoulder pain    HPI:  Pt is a 74 y.o. female seen today for an acute visit for constipation, no BM x 4-5 days, on Senokot S II qd, prn MiraLax, denied abd pain, nausea, vomiting, or change in appetite. Stated she had hacking cough this morning, denied SOB, chest pain/pressures, palpitation, no O2 desaturation, she is afebrile. R should pain, no tingling, numbness, or weakness in right arm/hand. Hx of seasonal allergies,  on Zyrtec 10mg  qd. IBS, prn Levsin 0.125mg  available. GERD, stable on Omeprazole 40mg  qd, prn Zofran. S/p R hip, R shoulder surgeries, prn Oxycodone, prn Methocabamol 500mg  q6hr.  Her mood is stable, on Paxil 10mg  qd, Wellbutrin 300mg  qd.   Hospitalized 01/29/20-02/06/20 closed fracture of right proximal humerus, s/p right shoulder total arthroplasty. . 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 Post op anemia, transfused.    Past Medical History:  Diagnosis Date  . Colon polyps 09-25-2007   Colonoscopy  . Depression   . Diverticulosis of colon (without mention of hemorrhage) 09-25-2007   Colonoscopy  . GERD (gastroesophageal reflux disease)   . Gynecological examination    sees Dr. Carren Rang   . Hyperlipidemia    Past Surgical History:  Procedure Laterality Date  . COLONOSCOPY  12/30/2015   per Dr. Henrene Pastor, benign  polyps, repeat in 5  yrs.   . ESOPHAGOGASTRODUODENOSCOPY  06-30-12   per Dr. Sharlett Iles, reflux but no Barretts seen   . HARDWARE REMOVAL Right 02/01/2020   Procedure: HARDWARE REMOVAL;  Surgeon: Mcarthur Rossetti, MD;  Location: [facility];  Service: Orthopedics;  Laterality: Right;  . INTRAMEDULLARY (IM) NAIL INTERTROCHANTERIC Right 06/22/2019  . INTRAMEDULLARY (IM) NAIL INTERTROCHANTERIC Right 06/22/2019   Procedure: INTRAMEDULLARY (IM) NAIL INTERTROCHANTRIC;  Surgeon: Altamese Palomas, MD;  Location: [facility];  Service: Orthopedics;  Laterality: Right;  . REVERSE SHOULDER ARTHROPLASTY Right 01/29/2020   Procedure: RIGHT REVERSE SHOULDER ARTHROPLASTY;  Surgeon: Marchia Bond, MD;  Location: [facility];  Service: Orthopedics;  Laterality: Right;  . TOTAL HIP ARTHROPLASTY Right 02/01/2020   Procedure: TOTAL HIP ARTHROPLASTY ANTERIOR APPROACH;  Surgeon: Mcarthur Rossetti, MD;  Location: [facility];  Service: Orthopedics;  Laterality: Right;    Allergies  Allergen Reactions  . Eggs Or Egg-Derived Products     Hives   . Zoloft [Sertraline Hcl]     Headaches     Outpatient Encounter Medications as of 02/07/2020  Medication Sig  . aspirin 81 MG chewable tablet Chew 1 tablet (81 mg total) by mouth 2 (two) times daily.  Marland Kitchen buPROPion (WELLBUTRIN XL) 300 MG 24 hr tablet TAKE 1 TABLET BY MOUTH EVERY MORNING  . cetirizine (ZYRTEC) 10 MG tablet TAKE 1 TABLET(10 MG) BY MOUTH  DAILY  . Cholecalciferol (VITAMIN D3) 125 MCG (5000 UT) TABS Take 5,000 Units by mouth daily.  Marland Kitchen EPINEPHrine 0.3 mg/0.3 mL IJ SOAJ injection Inject 0.3 mLs into the muscle daily as needed for anaphylaxis.   . hyoscyamine (LEVSIN SL) 0.125 MG SL tablet Place 1 tablet (0.125 mg total) under the tongue every 4 (four) hours as needed.  . methocarbamol (ROBAXIN) 500 MG tablet Take 1 tablet (500 mg total) by mouth every 6 (six) hours as needed for muscle spasms.  Marland Kitchen omeprazole (PRILOSEC) 40 MG capsule TAKE 1 CAPSULE BY MOUTH TWICE DAILY  . ondansetron (ZOFRAN) 4 MG tablet Take 1 tablet (4 mg total) by mouth every 8 (eight) hours as needed for nausea or vomiting.  Marland Kitchen oxyCODONE (ROXICODONE) 5 MG immediate release tablet Take 1-2 tablets (5-10 mg total) by mouth every 4 (four) hours as needed for severe pain.  Marland Kitchen PARoxetine (PAXIL) 10 MG tablet Take 10 mg by mouth daily.  . polyethylene glycol (MIRALAX / GLYCOLAX) 17 g packet Take 17 g by mouth daily as needed for moderate constipation or severe constipation.  . senna-docusate (SENOKOT-S) 8.6-50 MG tablet Take 1 tablet by mouth 2 (two) times daily.  . sennosides-docusate sodium (SENOKOT-S) 8.6-50 MG tablet Take 2 tablets by mouth daily.  Marland Kitchen triamcinolone cream (KENALOG) 0.1 % Apply 1 application topically 2 (two) times daily.  . [DISCONTINUED] Multiple Vitamins-Minerals (ADULT ONE DAILY GUMMIES PO) Take 1 tablet by mouth daily.   No facility-administered encounter medications on file as of 02/07/2020.    Review of Systems  Constitutional: Positive for fatigue. Negative for activity change, appetite change, chills, diaphoresis and fever.  HENT: Positive for hearing loss. Negative for congestion and voice change.   Eyes: Negative for visual disturbance.  Respiratory: Positive for cough. Negative for shortness of breath and wheezing.   Cardiovascular: Positive for leg swelling. Negative for chest pain and palpitations.  Gastrointestinal: Positive for  constipation. Negative for abdominal distention, abdominal pain, diarrhea, nausea and vomiting.  Genitourinary: Negative for difficulty urinating, dysuria and urgency.  Musculoskeletal: Positive for arthralgias, gait problem and joint swelling.  Skin: Positive for wound.       Right shoulder, hip  Neurological: Negative for dizziness, speech difficulty, weakness and headaches.  Psychiatric/Behavioral: Positive for sleep disturbance. Negative for agitation, behavioral problems, confusion and hallucinations.    Immunization History  Administered Date(s) Administered  . Pneumococcal Conjugate-13 10/11/2014  . Pneumococcal Polysaccharide-23 09/19/2012  . Tdap 09/13/2011  . Zoster 09/13/2011   Pertinent  Health Maintenance Due  Topic Date Due  . PNA vac Low Risk Adult (2 of 2 - PPSV23) 09/19/2017  . INFLUENZA VACCINE  07/07/2019  . COLONOSCOPY  12/29/2020  . MAMMOGRAM  12/02/2021  . DEXA SCAN  Completed   Fall Risk  06/19/2019 06/12/2018 07/30/2016  Falls in the past year? 1 No No  Comment - - Emmi Telephone Survey: data to providers prior to load  Number falls in past yr: 1 - -  Injury with Fall? 0 - -  Risk for fall due to : History of fall(s);Impaired balance/gait - -  Follow up Falls evaluation completed - -   Functional Status Survey:    Vitals:  02/07/20 1608  BP: 136/78  Pulse: 86  Resp: 20  Temp: 97.9 F (36.6 C)  TempSrc: Oral  SpO2: 96%  Weight: 197 lb (89.4 kg)  Height: 5\' 2"  (1.575 m)   Body mass index is 36.03 kg/m. Physical Exam Vitals and nursing note reviewed.  Constitutional:      General: She is not in acute distress.    Appearance: Normal appearance. She is obese. She is not ill-appearing, toxic-appearing or diaphoretic.  HENT:     Head: Normocephalic and atraumatic.     Nose: Nose normal.     Mouth/Throat:     Mouth: Mucous membranes are moist.  Eyes:     Extraocular Movements: Extraocular movements intact.     Conjunctiva/sclera: Conjunctivae  normal.     Pupils: Pupils are equal, round, and reactive to light.  Cardiovascular:     Rate and Rhythm: Normal rate and regular rhythm.     Heart sounds: No murmur.  Pulmonary:     Breath sounds: No wheezing, rhonchi or rales.  Abdominal:     General: Bowel sounds are normal. There is no distension.     Palpations: Abdomen is soft.     Tenderness: There is no abdominal tenderness. There is no right CVA tenderness, left CVA tenderness, guarding or rebound.  Musculoskeletal:        General: Tenderness present.     Cervical back: Normal range of motion and neck supple.     Right lower leg: Edema present.     Comments: Right arm, right leg, trace edema. Pain with ROM of the right shoulder and hip.   Skin:    General: Skin is warm and dry.     Comments: Right shoulder, right hip surgical wounds are covered during my examination today.   Neurological:     General: No focal deficit present.     Mental Status: She is alert and oriented to person, place, and time. Mental status is at baseline.     Cranial Nerves: No cranial nerve deficit.     Motor: No weakness.     Coordination: Coordination normal.     Gait: Gait abnormal.  Psychiatric:        Mood and Affect: Mood normal.        Behavior: Behavior normal.        Thought Content: Thought content normal.        Judgment: Judgment normal.     Labs reviewed: Recent Labs    06/24/19 0338 06/24/19 0338 06/25/19 0431 06/25/19 0431 06/26/19 0241 06/26/19 0241 01/28/20 1400 01/30/20 0422 02/02/20 0343  NA 134*   < > 137   < > 136   < > 138 135 137  K 4.1   < > 3.8   < > 3.6   < > 4.2 4.9 4.7  CL 102   < > 104   < > 104   < > 107 104 104  CO2 24   < > 24   < > 25   < > 22 24 26   GLUCOSE 116*   < > 111*   < > 105*   < > 131* 115* 120*  BUN 15   < > 19   < > 24*   < > 25* 23 18  CREATININE 0.81   < > 0.83   < > 0.85   < > 0.84 0.83 0.66  CALCIUM 8.7*   < > 8.9   < > 9.1   < >  9.6 8.4* 8.6*  MG 2.0  --  1.9  --  2.1  --   --   --    --    < > = values in this interval not displayed.   Recent Labs    06/19/19 1121  AST 8  ALT 6  ALKPHOS 91  BILITOT 0.6  PROT 6.4  ALBUMIN 4.4   Recent Labs    06/19/19 1121 06/21/19 2308 06/22/19 1633 06/23/19 0359 02/03/20 0901 02/04/20 0418 02/05/20 0324  WBC 7.8   < > 14.7*   < > 10.0 10.2 9.7  NEUTROABS 4.5  --  12.4*  --   --   --   --   HGB 14.5   < > 14.2   < > 7.6* 7.0* 9.2*  HCT 43.8   < > 43.4   < > 22.7* 22.8* 28.5*  MCV 93.1   < > 93.7   < > 91.2 96.6 94.4  PLT 284.0   < > 276   < > 194 192 229   < > = values in this interval not displayed.   Lab Results  Component Value Date   TSH 2.52 06/19/2019   No results found for: HGBA1C Lab Results  Component Value Date   CHOL 242 (H) 06/19/2019   HDL 45.50 06/19/2019   LDLCALC 157 (H) 06/19/2019   LDLDIRECT 208.4 08/17/2007   TRIG 196.0 (H) 06/19/2019   CHOLHDL 5 06/19/2019    Significant Diagnostic Results in last 30 days:  DG Shoulder Right  Result Date: 01/27/2020 CLINICAL DATA:  74 year old female with fall and trauma to the right shoulder. EXAM: RIGHT SHOULDER - 2+ VIEW; RIGHT HUMERUS - 2+ VIEW; RIGHT ELBOW - COMPLETE 3+ VIEW COMPARISON:  Chest radiograph dated 06/21/2019. FINDINGS: There is a comminuted and mildly displaced fracture of the right humeral neck and head with extension into the articular surface of the humerus with glenoid. Evaluation for right shoulder dislocation is limited. There is widening of the joint suggestive of effusion or hematoma. No other acute fracture identified. The soft tissues are grossly unremarkable. IMPRESSION: Comminuted, intra-articular, and displaced fracture of the right humeral head and neck. There may be associated degree of subluxation of the right shoulder. Electronically Signed   By: Anner Crete M.D.   On: 01/27/2020 20:34   DG Elbow Complete Right  Result Date: 01/27/2020 CLINICAL DATA:  73 year old female with fall and trauma to the right shoulder.  EXAM: RIGHT SHOULDER - 2+ VIEW; RIGHT HUMERUS - 2+ VIEW; RIGHT ELBOW - COMPLETE 3+ VIEW COMPARISON:  Chest radiograph dated 06/21/2019. FINDINGS: There is a comminuted and mildly displaced fracture of the right humeral neck and head with extension into the articular surface of the humerus with glenoid. Evaluation for right shoulder dislocation is limited. There is widening of the joint suggestive of effusion or hematoma. No other acute fracture identified. The soft tissues are grossly unremarkable. IMPRESSION: Comminuted, intra-articular, and displaced fracture of the right humeral head and neck. There may be associated degree of subluxation of the right shoulder. Electronically Signed   By: Anner Crete M.D.   On: 01/27/2020 20:34   CT SHOULDER RIGHT WO CONTRAST  Result Date: 01/28/2020 CLINICAL DATA:  [place] and fractured right shoulder. EXAM: CT OF THE UPPER RIGHT EXTREMITY WITHOUT CONTRAST TECHNIQUE: Multidetector CT imaging of the upper right extremity was performed according to the standard protocol. COMPARISON:  Radiographs 01/27/2020 FINDINGS: Complex comminuted humeral head and neck fractures. The transverse fracture through the  humeral neck demonstrates comminution and mild impaction. Maximum displacement is 10.5 mm. There is also a vertical fracture through the greater tuberosity which becomes a head split fracture superiorly splitting the humeral head. Maximal displacement is 14 mm. The glenoid is intact. No acute glenoid fracture. Calcification along the glenoid suggesting chondrocalcinosis. The [facility] joint is intact. Moderate degenerative changes with calcified pannus. The acromion is type 2 in shape. No significant lateral downsloping or undersurface spurring. Grossly by CT the rotator cuff tendons are intact. The humeroacromial space is normal. The visualized right ribs are intact and the visualized right lung is grossly clear. Fairly significant inflammation/edema/fluid/hematoma involving the  right shoulder muscles and subcutaneous tissues. IMPRESSION: 1. Complex comminuted humeral head and neck fractures as described above. Head split fracture component as detailed above. 2. No acute glenoid fracture. 3. Intact rotator cuff tendons. 4. Fairly significant inflammation/edema/fluid/hematoma involving the right shoulder muscles and subcutaneous tissues. Electronically Signed   By: Marijo Sanes M.D.   On: 01/28/2020 17:04   DG Pelvis Portable  Result Date: 02/01/2020 CLINICAL DATA:  Right hip arthroplasty. EXAM: PORTABLE PELVIS 1-2 VIEWS COMPARISON:  CT 01/30/2020. FINDINGS: Total right hip replacement. Hardware intact. Anatomic alignment. Diffuse osteopenia and degenerative change. IMPRESSION: Total right hip arthroplasty.  Hardware intact.  Anatomic alignment. Electronically Signed   By: Marcello Moores  Register   On: 02/01/2020 15:00   CT HIP RIGHT WO CONTRAST  Result Date: 01/30/2020 CLINICAL DATA:  Right hip pain since surgery 6 months ago. EXAM: CT OF THE RIGHT HIP WITHOUT CONTRAST TECHNIQUE: Multidetector CT imaging of the right hip was performed according to the standard protocol. Multiplanar CT image reconstructions were also generated. COMPARISON:  CT right hip 11/23/2019. Radiographs 06/22/2019. FINDINGS: Bones/Joint/Cartilage Status post dynamic compression screw plate fixation of the right femur with a long intramedullary nail, the distal components of which are not imaged by this examination. The visualized hardware is intact. There is stable mild lucency surrounding the proximal aspect of the intramedullary nail. There has been no change in the alignment of the comminuted intertrochanteric right femur fracture. Small areas of bridging bone are demonstrated anteriorly and laterally, although many of the fracture lines are still visible, consistent with incomplete healing. No evidence of acute fracture, dislocation or femoral head avascular necrosis. The visualized inferior right hemipelvis is  intact. No significant right hip arthropathy, although there is a possible small joint effusion. Ligaments Suboptimally assessed by CT. Muscles and Tendons Unremarkable. Soft tissues Stable postsurgical changes in the lateral subcutaneous fat without focal fluid collection. IMPRESSION: 1. Stable appearance of the comminuted intertrochanteric right femur fracture post ORIF. The fracture demonstrates incomplete healing but stable alignment. 2. No evidence of acute fracture, dislocation or femoral head avascular necrosis. Electronically Signed   By: Richardean Sale M.D.   On: 01/30/2020 20:22   DG Shoulder Right Port  Result Date: 01/29/2020 CLINICAL DATA:  Fall fracture EXAM: PORTABLE RIGHT SHOULDER COMPARISON:  CT January 28, 2020 FINDINGS: The patient is status post reverse total shoulder arthroplasty. No new periprosthetic lucency seen. The comminuted proximal humerus shaft fracture is seen. No AC joint widening. Mild overlying soft tissue swelling. IMPRESSION: Status post right shoulder total arthroplasty without acute hardware complication. Electronically Signed   By: Prudencio Pair M.D.   On: 01/29/2020 19:38   DG Humerus Right  Result Date: 01/27/2020 CLINICAL DATA:  74 year old female with fall and trauma to the right shoulder. EXAM: RIGHT SHOULDER - 2+ VIEW; RIGHT HUMERUS - 2+ VIEW; RIGHT ELBOW - COMPLETE  3+ VIEW COMPARISON:  Chest radiograph dated 06/21/2019. FINDINGS: There is a comminuted and mildly displaced fracture of the right humeral neck and head with extension into the articular surface of the humerus with glenoid. Evaluation for right shoulder dislocation is limited. There is widening of the joint suggestive of effusion or hematoma. No other acute fracture identified. The soft tissues are grossly unremarkable. IMPRESSION: Comminuted, intra-articular, and displaced fracture of the right humeral head and neck. There may be associated degree of subluxation of the right shoulder. Electronically  Signed   By: Anner Crete M.D.   On: 01/27/2020 20:34   DG C-Arm 1-60 Min-No Report  Result Date: 02/01/2020 Fluoroscopy was utilized by the requesting physician.  No radiographic interpretation.   DG HIP OPERATIVE UNILAT WITH PELVIS RIGHT  Result Date: 02/01/2020 CLINICAL DATA:  Right hip arthroplasty. EXAM: OPERATIVE RIGHT HIP (WITH PELVIS IF PERFORMED) 5 VIEWS TECHNIQUE: Fluoroscopic spot image(s) were submitted for interpretation post-operatively. COMPARISON:  CT 01/30/2020. FINDINGS: Total right hip replacement. Hardware intact. Anatomic alignment. Surgical screw noted in the distal femur. No acute bony abnormality identified. IMPRESSION: Total right hip replacement.  Hardware intact.  Anatomic alignment. Electronically Signed   By: Marcello Moores  Register   On: 02/01/2020 14:05    Assessment/Plan IBS (irritable bowel syndrome) no BM x 4-5 days, continue  Senokot S II qd, prn MiraLax, prn Levsin, she denied abd pain, nausea, vomiting, or change in appetite. She declined change of Laxatives, stated it will resolved after she adjusted to the new environment and relaxed. Observe.   GERD Stable, continue Omeprazole, prn Zofran.   Depression, recurrent ([place]) Her mood is stable, continue Paxil, Wellbutrin.   Environmental and seasonal allergies Continue Zyrtec, prn Epinephrine inj. Continue to monitor cough, the patient is recommended to breathe deeply, increase activity gradually. Observe.   Right shoulder pain S/p total right shoulder replacement for closed fracture of right proximal humerus, sling, prn Oxycodone, Methocarbamol.   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 Continue therapy, prn Oxycodone, prn Methocarbamol.   Blood loss anemia Hgb 9.2 02/05/20, s/p  transfusion, may consider Fe supplement.      Family/ staff Communication: plan of care reviewed with the patient and charge nurse.   Labs/tests ordered: none  Time spend 35 minutes.

## 2020-02-08 ENCOUNTER — Encounter: Payer: Self-pay | Admitting: Nurse Practitioner

## 2020-02-08 DIAGNOSIS — D500 Iron deficiency anemia secondary to blood loss (chronic): Secondary | ICD-10-CM | POA: Insufficient documentation

## 2020-02-08 DIAGNOSIS — M25511 Pain in right shoulder: Secondary | ICD-10-CM | POA: Insufficient documentation

## 2020-02-08 DIAGNOSIS — M25551 Pain in right hip: Secondary | ICD-10-CM | POA: Insufficient documentation

## 2020-02-08 NOTE — Assessment & Plan Note (Signed)
Hgb 9.2 02/05/20, s/p transfusion, may consider Fe supplement.

## 2020-02-08 NOTE — Assessment & Plan Note (Signed)
no BM x 4-5 days, continue  Senokot S II qd, prn MiraLax, prn Levsin, she denied abd pain, nausea, vomiting, or change in appetite. She declined change of Laxatives, stated it will resolved after she adjusted to the new environment and relaxed. Observe.

## 2020-02-08 NOTE — Assessment & Plan Note (Signed)
Stable, continue Omeprazole, prn Zofran.

## 2020-02-08 NOTE — Assessment & Plan Note (Signed)
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 Continue therapy, prn Oxycodone, prn Methocarbamol.

## 2020-02-08 NOTE — Assessment & Plan Note (Signed)
Continue Zyrtec, prn Epinephrine inj. Continue to monitor cough, the patient is recommended to breathe deeply, increase activity gradually. Observe.

## 2020-02-08 NOTE — Assessment & Plan Note (Signed)
S/p total right shoulder replacement for closed fracture of right proximal humerus, sling, prn Oxycodone, Methocarbamol.

## 2020-02-08 NOTE — Assessment & Plan Note (Signed)
Her mood is stable, continue Paxil, Wellbutrin.

## 2020-02-11 DIAGNOSIS — S42201D Unspecified fracture of upper end of right humerus, subsequent encounter for fracture with routine healing: Secondary | ICD-10-CM | POA: Diagnosis not present

## 2020-02-12 ENCOUNTER — Other Ambulatory Visit: Payer: Self-pay | Admitting: Internal Medicine

## 2020-02-12 ENCOUNTER — Non-Acute Institutional Stay (SKILLED_NURSING_FACILITY): Payer: Medicare Other | Admitting: Internal Medicine

## 2020-02-12 ENCOUNTER — Telehealth: Payer: Self-pay | Admitting: Family Medicine

## 2020-02-12 ENCOUNTER — Encounter: Payer: Self-pay | Admitting: Internal Medicine

## 2020-02-12 DIAGNOSIS — Z96641 Presence of right artificial hip joint: Secondary | ICD-10-CM | POA: Diagnosis not present

## 2020-02-12 DIAGNOSIS — D649 Anemia, unspecified: Secondary | ICD-10-CM | POA: Diagnosis not present

## 2020-02-12 DIAGNOSIS — F419 Anxiety disorder, unspecified: Secondary | ICD-10-CM

## 2020-02-12 DIAGNOSIS — F329 Major depressive disorder, single episode, unspecified: Secondary | ICD-10-CM | POA: Diagnosis not present

## 2020-02-12 DIAGNOSIS — Z96611 Presence of right artificial shoulder joint: Secondary | ICD-10-CM | POA: Diagnosis not present

## 2020-02-12 DIAGNOSIS — F32A Depression, unspecified: Secondary | ICD-10-CM

## 2020-02-12 MED ORDER — LORAZEPAM 0.5 MG PO TABS: 0.5000 mg | ORAL_TABLET | Freq: Every day | ORAL | 0 refills | Status: AC | PRN

## 2020-02-12 NOTE — Telephone Encounter (Signed)
Danielle from [place] is requesting pt immunization records be faxed to their office.   Fax: [phone] Attn:Danielle

## 2020-02-12 NOTE — Progress Notes (Signed)
Provider:  Veleta Miners MD Location:   [facility] Room Number: [id number] Place of Service:  SNF ([phone])  PCP: Laurey Morale, MD Patient Care Team: Laurey Morale, MD as PCP - General  Extended Emergency Contact Information Primary Emergency Contact: [place] Phone: [phone] Mobile Phone: [phone] Relation: Son Secondary Emergency Contact: Rulon [place] Phone: [phone] Mobile Phone: [phone] Relation: Daughter  Code Status: Full Code Goals of Care: Advanced Directive information Advanced Directives 01/30/2020  Does Patient Have a Medical Advance Directive? -  Type of Advance Directive -  Does patient want to make changes to medical advance directive? No - Patient declined  Copy of [place] in Chart? -  Would patient like information on creating a medical advance directive? -      Chief Complaint  Patient presents with  . New Admit To SNF    Admission    HPI: Patient is a 74 y.o. female seen today for admission to SNF for therapy  Patient has h/o Depression , Osteoporosis, GERD, S/P Right Hip Fracture in 7/20  She was admitted in the hospital 2/23-3/3  for Right THR and Right Reverse Total Shoulder Arthroplasty Patient had a mechanical fall at home when she was getting her Garbage. She sustained a proximal humerus fracture.  Was admitted electively after 2 days for total shoulder arthroplasty.  During that she also was complaining of right hip pain and it was decided that she needs a total hip replacement to.  She tolerated both procedures well.  She did have anemia postop which was treated with transfusion. Patient is doing well with therapy.  Her only problem is anxiety and feeling depressed. Bowels are moving well.  Denies any weakness cough. Pain Seems Controlled She lives by herself in a house.  Lost her husband a few years ago to Alzheimer's.  Has a daughter in town.  Does  have steps going into her house   Past Medical History:  Diagnosis Date  . Colon polyps 09-25-2007   Colonoscopy  . Depression   . Diverticulosis of colon (without mention of hemorrhage) 09-25-2007   Colonoscopy  . GERD (gastroesophageal reflux disease)   . Gynecological examination    sees Dr. Carren Rang   . Hyperlipidemia    Past Surgical History:  Procedure Laterality Date  . COLONOSCOPY  12/30/2015   per Dr. Henrene Pastor, benign  polyps, repeat in 5  yrs.   . ESOPHAGOGASTRODUODENOSCOPY  06-30-12   per Dr. Sharlett Iles, reflux but no Barretts seen   . HARDWARE REMOVAL Right 02/01/2020   Procedure: HARDWARE REMOVAL;  Surgeon: Mcarthur Rossetti, MD;  Location: [facility];  Service: Orthopedics;  Laterality: Right;  . INTRAMEDULLARY (IM) NAIL INTERTROCHANTERIC Right 06/22/2019  . INTRAMEDULLARY (IM) NAIL INTERTROCHANTERIC Right 06/22/2019   Procedure: INTRAMEDULLARY (IM) NAIL INTERTROCHANTRIC;  Surgeon: Altamese Capitanejo, MD;  Location: [place];  Service: Orthopedics;  Laterality: Right;  . REVERSE SHOULDER ARTHROPLASTY Right 01/29/2020   Procedure: RIGHT REVERSE SHOULDER ARTHROPLASTY;  Surgeon: Marchia Bond, MD;  Location: [facility];  Service: Orthopedics;  Laterality: Right;  . TOTAL HIP ARTHROPLASTY Right 02/01/2020   Procedure: TOTAL HIP ARTHROPLASTY ANTERIOR APPROACH;  Surgeon: Mcarthur Rossetti, MD;  Location: [facility];  Service: Orthopedics;  Laterality: Right;    reports that she has been smoking cigarettes. She has a 15.00 pack-year smoking history. She has never used smokeless tobacco. She reports current alcohol use. She reports that she does not use drugs. Social  History   Socioeconomic History  . Marital status: Married    Spouse name: Not on file  . Number of children: 2  . Years of education: Not on file  . Highest education level: Not on file  Occupational History  . Occupation: retired Pharmacist, hospital  Tobacco Use  . Smoking status: Current Every Day Smoker    Packs/day: 0.50     Years: 30.00    Pack years: 15.00    Types: Cigarettes  . Smokeless tobacco: Never Used  . Tobacco comment: or less  Substance and Sexual Activity  . Alcohol use: Yes    Alcohol/week: 0.0 standard drinks    Comment: occasional  . Drug use: No  . Sexual activity: Not on file  Other Topics Concern  . Not on file  Social History Narrative  . Not on file   Social Determinants of Health   Financial Resource Strain:   . Difficulty of Paying Living Expenses: Not on file  Food Insecurity:   . Worried About Charity fundraiser in the Last Year: Not on file  . Ran Out of Food in the Last Year: Not on file  Transportation Needs:   . Lack of Transportation (Medical): Not on file  . Lack of Transportation (Non-Medical): Not on file  Physical Activity:   . Days of Exercise per Week: Not on file  . Minutes of Exercise per Session: Not on file  Stress:   . Feeling of Stress : Not on file  Social Connections:   . Frequency of Communication with Friends and Family: Not on file  . Frequency of Social Gatherings with Friends and Family: Not on file  . Attends Religious Services: Not on file  . Active Member of Clubs or Organizations: Not on file  . Attends Archivist Meetings: Not on file  . Marital Status: Not on file  Intimate Partner Violence:   . Fear of Current or Ex-Partner: Not on file  . Emotionally Abused: Not on file  . Physically Abused: Not on file  . Sexually Abused: Not on file    Functional Status Survey:    Family History  Problem Relation Age of Onset  . Breast cancer Maternal Grandmother   . Crohn's disease Father   . Colon polyps Father   . Stroke Paternal Grandfather     Health Maintenance  Topic Date Due  . PNA vac Low Risk Adult (2 of 2 - PPSV23) 09/19/2017  . INFLUENZA VACCINE  07/07/2019  . COLONOSCOPY  12/29/2020  . TETANUS/TDAP  09/12/2021  . MAMMOGRAM  12/02/2021  . DEXA SCAN  Completed  . Hepatitis C Screening  Completed     Allergies  Allergen Reactions  . Eggs Or Egg-Derived Products     Hives   . Zoloft [Sertraline Hcl]     Headaches     Allergies as of 02/12/2020      Reactions   Eggs Or Egg-derived Products    Hives   Zoloft [sertraline Hcl]    Headaches       Medication List       Accurate as of February 12, 2020  9:49 AM. If you have any questions, ask your nurse or doctor.        aspirin 81 MG chewable tablet Chew 1 tablet (81 mg total) by mouth 2 (two) times daily.   buPROPion 300 MG 24 hr tablet Commonly known as: WELLBUTRIN XL TAKE 1 TABLET BY MOUTH EVERY MORNING   cetirizine 10  MG tablet Commonly known as: ZYRTEC TAKE 1 TABLET(10 MG) BY MOUTH DAILY   EPINEPHrine 0.3 mg/0.3 mL Soaj injection Commonly known as: EPI-PEN Inject 0.3 mLs into the muscle daily as needed for anaphylaxis.   hyoscyamine 0.125 MG SL tablet Commonly known as: LEVSIN SL Place 1 tablet (0.125 mg total) under the tongue every 4 (four) hours as needed.   methocarbamol 500 MG tablet Commonly known as: ROBAXIN Take 1 tablet (500 mg total) by mouth every 6 (six) hours as needed for muscle spasms.   omeprazole 40 MG capsule Commonly known as: PRILOSEC TAKE 1 CAPSULE BY MOUTH TWICE DAILY   ondansetron 4 MG tablet Commonly known as: Zofran Take 1 tablet (4 mg total) by mouth every 8 (eight) hours as needed for nausea or vomiting.   oxyCODONE 5 MG immediate release tablet Commonly known as: Roxicodone Take 1-2 tablets (5-10 mg total) by mouth every 4 (four) hours as needed for severe pain.   PARoxetine 10 MG tablet Commonly known as: PAXIL Take 10 mg by mouth daily.   polyethylene glycol 17 g packet Commonly known as: MIRALAX / GLYCOLAX Take 17 g by mouth daily as needed for moderate constipation or severe constipation.   senna-docusate 8.6-50 MG tablet Commonly known as: Senokot-S Take 1 tablet by mouth 2 (two) times daily. What changed: Another medication with the same name was removed.  Continue taking this medication, and follow the directions you see here. Changed by: Virgie Dad, MD   triamcinolone cream 0.1 % Commonly known as: KENALOG Apply 1 application topically 2 (two) times daily.   Vitamin D3 125 MCG (5000 UT) Tabs Take 5,000 Units by mouth daily.       Review of Systems  Review of Systems  Constitutional: Negative for activity change, appetite change, chills, diaphoresis, fatigue and fever.  HENT: Negative for mouth sores, postnasal drip, rhinorrhea, sinus pain and sore throat.   Respiratory: Negative for apnea, cough, chest tightness, shortness of breath and wheezing.   Cardiovascular: Negative for chest pain, palpitations and leg swelling.  Gastrointestinal: Negative for abdominal distention, abdominal pain, constipation, diarrhea, nausea and vomiting.  Genitourinary: Negative for dysuria and frequency.  Musculoskeletal: Negative for arthralgias, joint swelling and myalgias.  Skin: Negative for rash.  Neurological: Negative for dizziness, syncope, weakness, light-headedness and numbness.  Psychiatric/Behavioral: Negative for behavioral problems, confusion and sleep disturbance.  Does C/O Anxiety and Depression   Vitals:   02/12/20 0943  BP: 120/70  Pulse: 82  Resp: 20  Temp: 98.1 F (36.7 C)  SpO2: 98%  Weight: 197 lb (89.4 kg)  Height: 5\' 2"  (1.575 m)   Body mass index is 36.03 kg/m. Physical Exam  Constitutional: Oriented to person, place, and time. Well-developed and well-nourished.  HENT:  Head: Normocephalic.  Mouth/Throat: Oropharynx is clear and moist.  Eyes: Pupils are equal, round, and reactive to light.  Neck: Neck supple.  Cardiovascular: Normal rate and normal heart sounds.  No murmur heard. Pulmonary/Chest: Effort normal and breath sounds normal. No respiratory distress. No wheezes. She has no rales.  Abdominal: Soft. Bowel sounds are normal. No distension. There is no tenderness. There is no rebound.  Musculoskeletal:  Mild Edema Bilateral  Lymphadenopathy: none Neurological: Alert and oriented to person, place, and time.  Skin: Skin is warm and dry.  Psychiatric: Normal mood and affect. Behavior is normal. Thought content normal.    Labs reviewed: Basic Metabolic Panel: Recent Labs    06/24/19 0338 06/24/19 0338 06/25/19 0431 06/25/19 0431 06/26/19  [id number] 06/26/19 0241 01/28/20 1400 01/30/20 0422 02/02/20 0343  NA 134*   < > 137   < > 136   < > 138 135 137  K 4.1   < > 3.8   < > 3.6   < > 4.2 4.9 4.7  CL 102   < > 104   < > 104   < > 107 104 104  CO2 24   < > 24   < > 25   < > 22 24 26   GLUCOSE 116*   < > 111*   < > 105*   < > 131* 115* 120*  BUN 15   < > 19   < > 24*   < > 25* 23 18  CREATININE 0.81   < > 0.83   < > 0.85   < > 0.84 0.83 0.66  CALCIUM 8.7*   < > 8.9   < > 9.1   < > 9.6 8.4* 8.6*  MG 2.0  --  1.9  --  2.1  --   --   --   --    < > = values in this interval not displayed.   Liver Function Tests: Recent Labs    06/19/19 1121  AST 8  ALT 6  ALKPHOS 91  BILITOT 0.6  PROT 6.4  ALBUMIN 4.4   No results for input(s): LIPASE, AMYLASE in the last 8760 hours. No results for input(s): AMMONIA in the last 8760 hours. CBC: Recent Labs    06/19/19 1121 06/21/19 2308 06/22/19 1633 06/23/19 0359 02/03/20 0901 02/04/20 0418 02/05/20 0324  WBC 7.8   < > 14.7*   < > 10.0 10.2 9.7  NEUTROABS 4.5  --  12.4*  --   --   --   --   HGB 14.5   < > 14.2   < > 7.6* 7.0* 9.2*  HCT 43.8   < > 43.4   < > 22.7* 22.8* 28.5*  MCV 93.1   < > 93.7   < > 91.2 96.6 94.4  PLT 284.0   < > 276   < > 194 192 229   < > = values in this interval not displayed.   Cardiac Enzymes: No results for input(s): CKTOTAL, CKMB, CKMBINDEX, TROPONINI in the last 8760 hours. BNP: Invalid input(s): POCBNP No results found for: HGBA1C Lab Results  Component Value Date   TSH 2.52 06/19/2019   No results found for: VITAMINB12 No results found for: FOLATE No results found for: IRON, TIBC,  FERRITIN  Imaging and Procedures obtained prior to SNF admission: CT SHOULDER RIGHT WO CONTRAST  Result Date: 01/28/2020 CLINICAL DATA:  [place] and fractured right shoulder. EXAM: CT OF THE UPPER RIGHT EXTREMITY WITHOUT CONTRAST TECHNIQUE: Multidetector CT imaging of the upper right extremity was performed according to the standard protocol. COMPARISON:  Radiographs 01/27/2020 FINDINGS: Complex comminuted humeral head and neck fractures. The transverse fracture through the humeral neck demonstrates comminution and mild impaction. Maximum displacement is 10.5 mm. There is also a vertical fracture through the greater tuberosity which becomes a head split fracture superiorly splitting the humeral head. Maximal displacement is 14 mm. The glenoid is intact. No acute glenoid fracture. Calcification along the glenoid suggesting chondrocalcinosis. The [facility] joint is intact. Moderate degenerative changes with calcified pannus. The acromion is type 2 in shape. No significant lateral downsloping or undersurface spurring. Grossly by CT the rotator cuff tendons are intact. The humeroacromial space is normal. The visualized right  ribs are intact and the visualized right lung is grossly clear. Fairly significant inflammation/edema/fluid/hematoma involving the right shoulder muscles and subcutaneous tissues. IMPRESSION: 1. Complex comminuted humeral head and neck fractures as described above. Head split fracture component as detailed above. 2. No acute glenoid fracture. 3. Intact rotator cuff tendons. 4. Fairly significant inflammation/edema/fluid/hematoma involving the right shoulder muscles and subcutaneous tissues. Electronically Signed   By: Marijo Sanes M.D.   On: 01/28/2020 17:04   DG Shoulder Right Port  Result Date: 01/29/2020 CLINICAL DATA:  Fall fracture EXAM: PORTABLE RIGHT SHOULDER COMPARISON:  CT January 28, 2020 FINDINGS: The patient is status post reverse total shoulder arthroplasty. No new periprosthetic  lucency seen. The comminuted proximal humerus shaft fracture is seen. No AC joint widening. Mild overlying soft tissue swelling. IMPRESSION: Status post right shoulder total arthroplasty without acute hardware complication. Electronically Signed   By: Prudencio Pair M.D.   On: 01/29/2020 19:38    Assessment/Plan  Total Right Hip Arthroplasty and S/P reverse total shoulder arthroplasty, right Doing well Pain Controlled Oxycodone and Robaxin Follow up with Ortho   Anxiety and depression Will start on Ativan 0.5mg  QD pRN for 14 days On Wellbutrin and Paxil Anemia, unspecified type Started on iron GERD On Prilosec Osteoporosis ON Vit D  Plan for treatment by her PCP  Family/ staff Communication:   Labs/tests ordered: BMP,CBC   Total time spent in this patient care encounter was  45_  minutes; greater than 50% of the visit spent counseling patient and staff, reviewing records , Labs and coordinating care for problems addressed at this encounter.

## 2020-02-12 NOTE — Telephone Encounter (Signed)
Request will be sent to HIM per protocol.

## 2020-02-13 ENCOUNTER — Other Ambulatory Visit: Payer: Self-pay | Admitting: *Deleted

## 2020-02-13 MED ORDER — OXYCODONE HCL 5 MG PO TABS: ORAL_TABLET | ORAL | 0 refills | Status: DC

## 2020-02-13 NOTE — Telephone Encounter (Signed)
Received fax from [facility] Pended Rx and sent to Dr. Gupta for approval.  

## 2020-02-14 LAB — CBC AND DIFFERENTIAL
HCT: 33 — AB (ref 36–46)
Hemoglobin: 10.5 — AB (ref 12.0–16.0)
Neutrophils Absolute: 6791
Platelets: 524 — AB (ref 150–399)
WBC: 9.9

## 2020-02-14 LAB — BASIC METABOLIC PANEL
BUN: 10 (ref 4–21)
CO2: 29 — AB (ref 13–22)
Chloride: 102 (ref 99–108)
Creatinine: 0.7 (ref 0.5–1.1)
Glucose: 92
Potassium: 4.3 (ref 3.4–5.3)
Sodium: 137 (ref 137–147)

## 2020-02-14 LAB — COMPREHENSIVE METABOLIC PANEL
Albumin: 3.6 (ref 3.5–5.0)
Calcium: 9.3 (ref 8.7–10.7)
Globulin: 2

## 2020-02-14 LAB — CBC: RBC: 3.62 — AB (ref 3.87–5.11)

## 2020-02-14 LAB — HEPATIC FUNCTION PANEL
ALT: 5 — AB (ref 7–35)
AST: 6 — AB (ref 13–35)
Alkaline Phosphatase: 140 — AB (ref 25–125)
Bilirubin, Total: 0.6

## 2020-02-15 NOTE — Progress Notes (Signed)
A user error has taken place.

## 2020-02-21 ENCOUNTER — Ambulatory Visit (INDEPENDENT_AMBULATORY_CARE_PROVIDER_SITE_OTHER): Payer: Medicare Other | Admitting: Orthopaedic Surgery

## 2020-02-21 ENCOUNTER — Other Ambulatory Visit: Payer: Self-pay

## 2020-02-21 ENCOUNTER — Encounter: Payer: Self-pay | Admitting: Orthopaedic Surgery

## 2020-02-21 DIAGNOSIS — Z96641 Presence of right artificial hip joint: Secondary | ICD-10-CM

## 2020-02-21 NOTE — Progress Notes (Signed)
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  We then converted this to a total hip arthroplasty.  She is convalescing at a skilled nursing facility getting therapy.  She reports that her range of motion and strength are slowly increasing.  She does still use a wheelchair mostly.  She is also recovering from a right reverse total shoulder arthroplasty.  On examination of her right hip incisions we removed all the staples in place Steri-Strips.  The most proximal incision at the right hip does have some serous drainage.  There is some slight erythema as well.  I would like to put her on doxycycline 100 mg twice daily.  Thus far this does not look like she needs a hospitalization or an irrigation and debridement based on this being just very minimal what I am seeing today.  With that being said though, I would like to see her back in just 7 days for repeat wound check.  No x-rays are needed.  If things worsen before then, her daughter know to give [place] a call.

## 2020-02-28 ENCOUNTER — Other Ambulatory Visit: Payer: Self-pay

## 2020-02-28 ENCOUNTER — Telehealth: Payer: Self-pay

## 2020-02-28 ENCOUNTER — Ambulatory Visit (INDEPENDENT_AMBULATORY_CARE_PROVIDER_SITE_OTHER): Payer: Medicare Other | Admitting: Orthopaedic Surgery

## 2020-02-28 ENCOUNTER — Encounter: Payer: Self-pay | Admitting: Orthopaedic Surgery

## 2020-02-28 DIAGNOSIS — Z96641 Presence of right artificial hip joint: Secondary | ICD-10-CM

## 2020-02-28 NOTE — Progress Notes (Signed)
The patient is returning at 1 week after I saw her in her first postoperative visit from an extensive right hip operation.  We took hardware out from her hip and converted her to a total hip arthroplasty.  At her first visit I was concerned about what her soft tissue look like.  I had her on doxycycline.  We did remove all the staples at the last visit.  She reports that she is doing well.  She is mainly here for a wound check.  Her daughter is with her as well.  She was able to stand on her own today.  I was able to assess all her hip incisions and the all look good.  There is no cellulitis or drainage.  At this point she can stop her antibiotics.  I would like to see her back in 4 weeks to see how she is doing overall.  At that visit I would like a standing low AP pelvis and a lateral of her right operative hip.  Obviously if there are any issues before then they know to let us know immediately.

## 2020-02-28 NOTE — Telephone Encounter (Signed)
I injected 1 cc Depo 80 in left buttock per Benjiman Core PA. She did very well.

## 2020-03-10 ENCOUNTER — Non-Acute Institutional Stay (SKILLED_NURSING_FACILITY): Payer: Medicare Other | Admitting: Nurse Practitioner

## 2020-03-10 ENCOUNTER — Encounter: Payer: Self-pay | Admitting: Nurse Practitioner

## 2020-03-10 ENCOUNTER — Encounter: Payer: Self-pay | Admitting: Internal Medicine

## 2020-03-10 DIAGNOSIS — M62838 Other muscle spasm: Secondary | ICD-10-CM

## 2020-03-10 DIAGNOSIS — F339 Major depressive disorder, recurrent, unspecified: Secondary | ICD-10-CM

## 2020-03-10 DIAGNOSIS — D500 Iron deficiency anemia secondary to blood loss (chronic): Secondary | ICD-10-CM | POA: Diagnosis not present

## 2020-03-10 DIAGNOSIS — K219 Gastro-esophageal reflux disease without esophagitis: Secondary | ICD-10-CM | POA: Diagnosis not present

## 2020-03-10 DIAGNOSIS — J189 Pneumonia, unspecified organism: Secondary | ICD-10-CM | POA: Diagnosis not present

## 2020-03-10 DIAGNOSIS — R109 Unspecified abdominal pain: Secondary | ICD-10-CM

## 2020-03-10 DIAGNOSIS — K581 Irritable bowel syndrome with constipation: Secondary | ICD-10-CM | POA: Diagnosis not present

## 2020-03-10 NOTE — Assessment & Plan Note (Signed)
C/o pain or spasm in the right lateral chest, flank, ? CVA tenderness with  deep breathing, coughing, movement, ? + Murphy's, last BM yesterday, will obtain CBC/diff, CMP/eGFR, UA C/S. CXR 3 views, prn Oxycodone for pain, Hx of IBS, prn Levsin, GERD, still c/o heart burns, will change to pantoprazole 53m qd. She denied dysuria, urinary frequency or urgency.

## 2020-03-10 NOTE — Progress Notes (Signed)
Location:   [facility] Room Number: 35 Place of Service:  SNF (31) Provider:  Mast, Man NP  Virgie Dad, MD  Patient Care Team: Virgie Dad, MD as PCP - General (Internal Medicine)  Extended Emergency Contact Information Primary Emergency Contact: [place] Phone: [phone] Mobile Phone: [phone] Relation: Son Secondary Emergency Contact: Rulon [place] Phone: [phone] Mobile Phone: [phone] Relation: Daughter  Code Status:  Full Code Goals of care: Advanced Directive information Advanced Directives 01/30/2020  Does Patient Have a Medical Advance Directive? -  Type of Advance Directive -  Does patient want to make changes to medical advance directive? No - Patient declined  Copy of [place] in Chart? -  Would patient like information on creating a medical advance directive? -     Chief Complaint  Patient presents with  . Acute Visit    Muscle spasm    HPI:  Pt is a 74 y.o. female seen today for an acute visit for In the right flank area, Robaxin prn is not effective, the patient stated its pain, catching when she takes a deep breath or with body movement.   Hx of IBS, prn Levsin, GERD, heart burns, on Omeprazole. Constipation, stable, on Senokot S bid, prn MiraLax, her mood is stable on Paxil 79m qd, prn Lorazepam, wellbutrin 3068mqd.  Anemia, stable, on Fe.    Past Medical History:  Diagnosis Date  . Colon polyps 09-25-2007   Colonoscopy  . Depression   . Diverticulosis of colon (without mention of hemorrhage) 09-25-2007   Colonoscopy  . GERD (gastroesophageal reflux disease)   . Gynecological examination    sees Dr. MeCarren Rang . Hyperlipidemia    Past Surgical History:  Procedure Laterality Date  . COLONOSCOPY  12/30/2015   per Dr. PeHenrene Pastorbenign  polyps, repeat in 5  yrs.   . ESOPHAGOGASTRODUODENOSCOPY  06-30-12   per Dr. PaSharlett Ilesreflux but no  Barretts seen   . HARDWARE REMOVAL Right 02/01/2020   Procedure: HARDWARE REMOVAL;  Surgeon: BlMcarthur RossettiMD;  Location: [facility];  Service: Orthopedics;  Laterality: Right;  . INTRAMEDULLARY (IM) NAIL INTERTROCHANTERIC Right 06/22/2019  . INTRAMEDULLARY (IM) NAIL INTERTROCHANTERIC Right 06/22/2019   Procedure: INTRAMEDULLARY (IM) NAIL INTERTROCHANTRIC;  Surgeon: HaAltamese CarolinaMD;  [place] [facility] Service: Orthopedics;  Laterality: Right;  . REVERSE SHOULDER ARTHROPLASTY Right 01/29/2020   Procedure: RIGHT REVERSE SHOULDER ARTHROPLASTY;  Surgeon: LaMarchia BondMD;  Location: [facility];  Service: Orthopedics;  Laterality: Right;  . TOTAL HIP ARTHROPLASTY Right 02/01/2020   Procedure: TOTAL HIP ARTHROPLASTY ANTERIOR APPROACH;  Surgeon: BlMcarthur RossettiMD;  Location: [facility];  Service: Orthopedics;  Laterality: Right;    Allergies  Allergen Reactions  . Eggs Or Egg-Derived Products     Hives   . Zoloft [Sertraline Hcl]     Headaches     Allergies as of 03/10/2020      Reactions   Eggs Or Egg-derived Products    Hives   Zoloft [sertraline Hcl]    Headaches       Medication List       Accurate as of March 10, 2020 11:59 PM. If you have any questions, ask your nurse or doctor.        STOP taking these medications   omeprazole 40 MG capsule Commonly known as: PRILOSEC Stopped by: Man X Mast, NP     TAKE these medications   aspirin  81 MG chewable tablet Chew 1 tablet (81 mg total) by mouth 2 (two) times daily.   buPROPion 300 MG 24 hr tablet Commonly known as: WELLBUTRIN XL TAKE 1 TABLET BY MOUTH EVERY MORNING   cetirizine 10 MG tablet Commonly known as: ZYRTEC TAKE 1 TABLET(10 MG) BY MOUTH DAILY   EPINEPHrine 0.3 mg/0.3 mL Soaj injection Commonly known as: EPI-PEN Inject 0.3 mLs into the muscle daily as needed for anaphylaxis.   ferrous sulfate 325 (65 FE) MG tablet Take 325 mg by mouth. Once A Day on Mon, Wed, Fri   hyoscyamine 0.125 MG SL  tablet Commonly known as: LEVSIN SL Place 1 tablet (0.125 mg total) under the tongue every 4 (four) hours as needed.   LORazepam 0.5 MG tablet Commonly known as: ATIVAN Take 0.5 mg by mouth daily as needed for anxiety.   methocarbamol 500 MG tablet Commonly known as: ROBAXIN Take 1 tablet (500 mg total) by mouth every 6 (six) hours as needed for muscle spasms.   ondansetron 4 MG tablet Commonly known as: Zofran Take 1 tablet (4 mg total) by mouth every 8 (eight) hours as needed for nausea or vomiting.   oxyCODONE 5 MG immediate release tablet Commonly known as: Roxicodone Take one tablet by mouth every 4 hours as needed for moderate pain; Take two tablets by mouth every 4 hours as needed for severe pain.   pantoprazole 40 MG tablet Commonly known as: PROTONIX Take 40 mg by mouth daily.   PARoxetine 10 MG tablet Commonly known as: PAXIL Take 10 mg by mouth daily.   polyethylene glycol 17 g packet Commonly known as: MIRALAX / GLYCOLAX Take 17 g by mouth daily as needed for moderate constipation or severe constipation.   senna-docusate 8.6-50 MG tablet Commonly known as: Senokot-S Take 1 tablet by mouth 2 (two) times daily.   triamcinolone cream 0.1 % Commonly known as: KENALOG Apply 1 application topically 2 (two) times daily.   Vitamin D3 125 MCG (5000 UT) Tabs Take 5,000 Units by mouth daily.       Review of Systems  Constitutional: Negative for activity change, appetite change, chills, diaphoresis, fatigue and fever.  HENT: Positive for hearing loss. Negative for congestion and voice change.   Eyes: Negative for visual disturbance.  Respiratory: Positive for cough. Negative for chest tightness, shortness of breath and wheezing.   Cardiovascular: Positive for leg swelling. Negative for chest pain and palpitations.  Gastrointestinal: Negative for abdominal distention, abdominal pain, constipation, diarrhea, nausea and vomiting.  Genitourinary: Negative for  difficulty urinating, dysuria and urgency.  Musculoskeletal: Positive for arthralgias, gait problem and joint swelling.       Right flank pain/spasm.   Skin: Positive for wound.  Neurological: Negative for speech difficulty, weakness, light-headedness and headaches.  Psychiatric/Behavioral: Negative for agitation, behavioral problems, confusion and sleep disturbance.    Immunization History  Administered Date(s) Administered  . Moderna SARS-COVID-2 Vaccination 02/29/2020  . Pneumococcal Conjugate-13 10/11/2014  . Pneumococcal Polysaccharide-23 09/19/2012  . Tdap 09/13/2011  . Zoster 09/13/2011   Pertinent  Health Maintenance Due  Topic Date Due  . PNA vac Low Risk Adult (2 of 2 - PPSV23) 09/19/2017  . INFLUENZA VACCINE  07/06/2020  . COLONOSCOPY  12/29/2020  . MAMMOGRAM  12/02/2021  . DEXA SCAN  Completed   Fall Risk  06/19/2019 06/12/2018 07/30/2016  Falls in the past year? 1 No No  Comment - - Emmi Telephone Survey: data to providers prior to load  Number falls in past  yr: 1 - -  Injury with Fall? 0 - -  Risk for fall due to : History of fall(s);Impaired balance/gait - -  Follow up Falls evaluation completed - -   Functional Status Survey:    Vitals:   03/10/20 1321  BP: 130/70  Pulse: 82  Resp: 20  Temp: 99 F (37.2 C)  SpO2: 95%  Weight: 189 lb 14.4 oz (86.1 kg)  Height: _0  (1.626 m)   Body mass index is 32.6 kg/m. Physical Exam Vitals and nursing note reviewed.  Constitutional:      General: She is not in acute distress.    Appearance: Normal appearance. She is obese. She is not ill-appearing, toxic-appearing or diaphoretic.  HENT:     Head: Normocephalic and atraumatic.     Nose: Nose normal.     Mouth/Throat:     Mouth: Mucous membranes are moist.  Eyes:     Extraocular Movements: Extraocular movements intact.     Conjunctiva/sclera: Conjunctivae normal.     Pupils: Pupils are equal, round, and reactive to light.  Cardiovascular:     Rate and  Rhythm: Normal rate and regular rhythm.     Heart sounds: No murmur.  Pulmonary:     Breath sounds: No wheezing, rhonchi or rales.  Abdominal:     General: Bowel sounds are normal. There is no distension.     Palpations: Abdomen is soft.     Tenderness: There is no abdominal tenderness. There is no right CVA tenderness, left CVA tenderness, guarding or rebound.  Musculoskeletal:        General: Tenderness present.     Cervical back: Normal range of motion and neck supple.     Right lower leg: Edema present.     Comments: RLE trace edema. Pain or spasm in the right flank with body movement and deep breathing. R hip/R shoulder surgical wound healing nicely.   Skin:    General: Skin is warm and dry.  Neurological:     General: No focal deficit present.     Mental Status: She is alert and oriented to person, place, and time. Mental status is at baseline.     Gait: Gait abnormal.  Psychiatric:        Mood and Affect: Mood normal.        Behavior: Behavior normal.        Thought Content: Thought content normal.        Judgment: Judgment normal.     Labs reviewed: Recent Labs    06/24/19 0338 06/24/19 0338 06/25/19 0431 06/25/19 0431 06/26/19 0241 06/26/19 0241 01/28/20 1400 01/28/20 1400 01/30/20 0422 02/02/20 0343 02/14/20 0000  NA 134*   < > 137   < > 136   < > 138   < > 135 137 137  K 4.1   < > 3.8   < > 3.6   < > 4.2   < > 4.9 4.7 4.3  CL 102   < > 104   < > 104   < > 107   < > 104 104 102  CO2 24   < > 24   < > 25   < > 22   < > 24 26 29*  GLUCOSE 116*   < > 111*   < > 105*   < > 131*  --  115* 120*  --   BUN 15   < > 19   < > 24*   < >  25*   < > _0 CREATININE 0.81   < > 0.83   < > 0.85   < > 0.84   < > 0.83 0.66 0.7  CALCIUM 8.7*   < > 8.9   < > 9.1   < > 9.6   < > 8.4* 8.6* 9.3  MG 2.0  --  1.9  --  2.1  --   --   --   --   --   --    < > = values in this interval not displayed.   Recent Labs    06/19/19 1121 02/14/20 0000  AST 8 6*  ALT 6 5*  ALKPHOS  91 140*  BILITOT 0.6  --   PROT 6.4  --   ALBUMIN 4.4 3.6   Recent Labs    06/19/19 1121 06/21/19 2308 06/22/19 1633 06/23/19 0359 02/03/20 0901 02/03/20 0901 02/04/20 0418 02/05/20 0324 02/14/20 0000  WBC 7.8   < > 14.7*   < > 10.0   < > 10.2 9.7 9.9  NEUTROABS 4.5  --  12.4*  --   --   --   --   --  6,791  HGB 14.5   < > 14.2   < > 7.6*   < > 7.0* 9.2* 10.5*  HCT 43.8   < > 43.4   < > 22.7*   < > 22.8* 28.5* 33*  MCV 93.1   < > 93.7   < > 91.2  --  96.6 94.4  --   PLT 284.0   < > 276   < > 194   < > 192 229 524*   < > = values in this interval not displayed.   Lab Results  Component Value Date   TSH 2.52 06/19/2019   No results found for: HGBA1C Lab Results  Component Value Date   CHOL 242 (H) 06/19/2019   HDL 45.50 06/19/2019   LDLCALC 157 (H) 06/19/2019   LDLDIRECT 208.4 08/17/2007   TRIG 196.0 (H) 06/19/2019   CHOLHDL 5 06/19/2019    Significant Diagnostic Results in last 30 days:  No results found.  Assessment/Plan Muscle spasm In the right flank area, Robaxin prn is not effective, the patient stated its pain, catching when she takes a deep breath or with body movement, update CBC/diff, CMP/eGFR,   Right flank pain C/o pain or spasm in the right lateral chest, flank, ? CVA tenderness with  deep breathing, coughing, movement, ? + Murphy's, last BM yesterday, will obtain CBC/diff, CMP/eGFR, UA C/S. CXR 3 views, prn Oxycodone for pain, Hx of IBS, prn Levsin, GERD, still c/o heart burns, will change to pantoprazole 46m qd. She denied dysuria, urinary frequency or urgency.   Pneumonia 03/10/20 CXR pulmonary infiltrate right lung base, small pleural effusion,  X-ray R ribs, no fxs. Levaquin 5050mqd x 7days, Florastor bid x 10 days, Atrovent q6h prn x 72 hours.  03/11/20 wbc 12.0, Hgb 11.1, plt 236, neutrophils 78.4, Na 133, K 4.2, Bun 18, creat 0.70, eGFR 87  GERD Not well controlled, c/o heart burns, will switch to Pantoprazole/Omeprazole in setting of the right  flank pain/spasm.   IBS (irritable bowel syndrome) Stable, on Senokot S bid, prn MiraLax for constipation, prn Levsin is available for abd pain.   Blood loss anemia 02/14/20 Hgb 10.1, continue Fe  Depression, recurrent (HCC) Her mood is stable, continue Wellbutrin, Paxil, prn Lorazepam.     Family/ staff Communication: plan of care  re  Labs/tests ordered:  CXR, X-ray ribs, CBC/diff, CMP/eGFR, UA C/S.   Time spend 25 minutes.

## 2020-03-10 NOTE — Assessment & Plan Note (Addendum)
In the right flank area, Robaxin prn is not effective, the patient stated its pain, catching when she takes a deep breath or with body movement, update CBC/diff, CMP/eGFR,

## 2020-03-11 ENCOUNTER — Other Ambulatory Visit: Payer: Self-pay

## 2020-03-11 DIAGNOSIS — J189 Pneumonia, unspecified organism: Secondary | ICD-10-CM | POA: Insufficient documentation

## 2020-03-11 LAB — BASIC METABOLIC PANEL
BUN: 18 (ref 4–21)
CO2: 27 — AB (ref 13–22)
Chloride: 100 (ref 99–108)
Creatinine: 0.7 (ref 0.5–1.1)
Glucose: 108
Potassium: 4.2 (ref 3.4–5.3)
Sodium: 133 — AB (ref 137–147)

## 2020-03-11 LAB — COMPREHENSIVE METABOLIC PANEL
Albumin: 3.4 — AB (ref 3.5–5.0)
Calcium: 9.4 (ref 8.7–10.7)
Globulin: 2

## 2020-03-11 LAB — HEPATIC FUNCTION PANEL
ALT: 3 — AB (ref 7–35)
AST: 5 — AB (ref 13–35)
Alkaline Phosphatase: 110 (ref 25–125)
Bilirubin, Total: 0.7

## 2020-03-11 LAB — CBC AND DIFFERENTIAL
HCT: 33 — AB (ref 36–46)
Hemoglobin: 11.1 — AB (ref 12.0–16.0)
Neutrophils Absolute: 9408
Platelets: 236 (ref 150–399)
WBC: 12

## 2020-03-11 LAB — CBC: RBC: 3.79 — AB (ref 3.87–5.11)

## 2020-03-11 NOTE — Telephone Encounter (Signed)
Incoming fax received from [facility] requesting refill for Ativan for a 14 day supply.  RX forwarded to [employer]

## 2020-03-11 NOTE — Assessment & Plan Note (Addendum)
03/10/20 CXR pulmonary infiltrate right lung base, small pleural effusion,  X-ray R ribs, no fxs. Levaquin 527m qd x 7days, Florastor bid x 10 days, Atrovent q6h prn x 72 hours.  03/11/20 wbc 12.0, Hgb 11.1, plt 236, neutrophils 78.4, Na 133, K 4.2, Bun 18, creat 0.70, eGFR 87

## 2020-03-12 DIAGNOSIS — S42201D Unspecified fracture of upper end of right humerus, subsequent encounter for fracture with routine healing: Secondary | ICD-10-CM | POA: Diagnosis not present

## 2020-03-12 MED ORDER — LORAZEPAM 0.5 MG PO TABS: 0.5000 mg | ORAL_TABLET | Freq: Every day | ORAL | 0 refills | Status: AC | PRN

## 2020-03-13 ENCOUNTER — Encounter: Payer: Self-pay | Admitting: Nurse Practitioner

## 2020-03-13 NOTE — Assessment & Plan Note (Signed)
Stable, on Senokot S bid, prn MiraLax for constipation, prn Levsin is available for abd pain.

## 2020-03-13 NOTE — Assessment & Plan Note (Signed)
Her mood is stable, continue Wellbutrin, Paxil, prn Lorazepam.

## 2020-03-13 NOTE — Assessment & Plan Note (Signed)
Not well controlled, c/o heart burns, will switch to Pantoprazole/Omeprazole in setting of the right flank pain/spasm.

## 2020-03-13 NOTE — Assessment & Plan Note (Signed)
02/14/20 Hgb 10.1, continue Fe

## 2020-03-17 ENCOUNTER — Other Ambulatory Visit: Payer: Self-pay | Admitting: *Deleted

## 2020-03-17 MED ORDER — OXYCODONE HCL 5 MG PO TABS: ORAL_TABLET | ORAL | 0 refills | Status: DC

## 2020-03-17 NOTE — Telephone Encounter (Signed)
Received fax refill Request from [facility] and sent to Dr. Gupta for approval.  

## 2020-03-27 ENCOUNTER — Ambulatory Visit (INDEPENDENT_AMBULATORY_CARE_PROVIDER_SITE_OTHER): Payer: Medicare Other

## 2020-03-27 ENCOUNTER — Other Ambulatory Visit: Payer: Self-pay

## 2020-03-27 ENCOUNTER — Ambulatory Visit (INDEPENDENT_AMBULATORY_CARE_PROVIDER_SITE_OTHER): Payer: Medicare Other | Admitting: Orthopaedic Surgery

## 2020-03-27 ENCOUNTER — Encounter: Payer: Self-pay | Admitting: Orthopaedic Surgery

## 2020-03-27 DIAGNOSIS — Z96641 Presence of right artificial hip joint: Secondary | ICD-10-CM

## 2020-03-27 NOTE — Progress Notes (Signed)
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  In the same setting she has been recovering from a right total shoulder replacement by one of my colleagues Dr. Mardelle Matte performed that same week.  She is still staying in skilled nursing but is transitioning to home soon.  She is still concerned about "dragging her leg".  She says that she is doing well from a pain standpoint and her mobility standpoint.  Overall from last time I saw her until today she is definitely improving in terms of her pain tolerance and mobility.  I can move her right hip much easier through internal extra rotation.  Her incisions of healed nicely.  A low AP pelvis lateral the right hip shows a well-seated total hip arthroplasty with no complicating features.  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  At this point she will continue aggressive therapy and transition to home therapy.  They can work on balance and coordination with hip strengthening and gait training.  I would like to see her back in 4 weeks to see how she is doing overall but no x-rays are needed.

## 2020-04-08 ENCOUNTER — Non-Acute Institutional Stay (SKILLED_NURSING_FACILITY): Payer: Medicare Other | Admitting: Internal Medicine

## 2020-04-08 ENCOUNTER — Encounter: Payer: Self-pay | Admitting: Internal Medicine

## 2020-04-08 DIAGNOSIS — F329 Major depressive disorder, single episode, unspecified: Secondary | ICD-10-CM | POA: Diagnosis not present

## 2020-04-08 DIAGNOSIS — F419 Anxiety disorder, unspecified: Secondary | ICD-10-CM | POA: Diagnosis not present

## 2020-04-08 DIAGNOSIS — F339 Major depressive disorder, recurrent, unspecified: Secondary | ICD-10-CM

## 2020-04-08 DIAGNOSIS — D500 Iron deficiency anemia secondary to blood loss (chronic): Secondary | ICD-10-CM | POA: Diagnosis not present

## 2020-04-08 DIAGNOSIS — Z96611 Presence of right artificial shoulder joint: Secondary | ICD-10-CM | POA: Diagnosis not present

## 2020-04-08 DIAGNOSIS — F32A Depression, unspecified: Secondary | ICD-10-CM

## 2020-04-08 NOTE — Progress Notes (Signed)
Location:  [facility] Room Number: 35-A Place of Service:  SNF [phone]) Provider:  Virgie Dad, MD  Patient Care Team: Virgie Dad, MD as PCP - General (Internal Medicine)  Extended Emergency Contact Information Primary Emergency Contact: [place] Phone: [phone] Mobile Phone: [phone] Relation: Son Secondary Emergency Contact: Rulon Eisenmenger [place] Phone: [phone] Mobile Phone: [phone] Relation: Daughter  Code Status:  Full Code Goals of care: Advanced Directive information Advanced Directives 01/30/2020  Does Patient Have a Medical Advance Directive? -  Type of Advance Directive -  Does patient want to make changes to medical advance directive? No - Patient declined  Copy of [place] in Chart? -  Would patient like information on creating a medical advance directive? -     Chief Complaint  Patient presents with  . Medical Management of Chronic Issues    Routine [facility] visit    HPI:  Pt is a 74 y.o. female seen today for medical management of chronic diseases.     Patient has h/o Depression , Osteoporosis, GERD, S/P Right Hip Fracture in 7/20 She was admitted in the hospital 2/23-3/3  for Right THR and Right Reverse Total Shoulder Arthroplasty Patient had a mechanical fall at home when she was getting her Garbage. She sustained a proximal humerus fracture.  Was admitted electively after 2 days for total shoulder arthroplasty.  During that she also was complaining of right hip pain and it was decided that she needs a total hip replacement to.  She tolerated both procedures well.  She did have anemia postop which was treated with transfusion.  Doing well with therapy. Still shuffling her Right leg as cannot raise much. But over all walking with no Pain No Falls.  Anxiety and Depression continues to be issue Taking Ativan every night Has lost almost 10  lbs Appetite is poor  Past Medical History:  Diagnosis Date  . Colon polyps 09-25-2007   Colonoscopy  . Depression   . Diverticulosis of colon (without mention of hemorrhage) 09-25-2007   Colonoscopy  . GERD (gastroesophageal reflux disease)   . Gynecological examination    sees Dr. Carren Rang   . Hyperlipidemia    Past Surgical History:  Procedure Laterality Date  . COLONOSCOPY  12/30/2015   per Dr. Henrene Pastor, benign  polyps, repeat in 5  yrs.   . ESOPHAGOGASTRODUODENOSCOPY  06-30-12   per Dr. Sharlett Iles, reflux but no Barretts seen   . HARDWARE REMOVAL Right 02/01/2020   Procedure: HARDWARE REMOVAL;  Surgeon: Mcarthur Rossetti, MD;  Location: [facility];  Service: Orthopedics;  Laterality: Right;  . INTRAMEDULLARY (IM) NAIL INTERTROCHANTERIC Right 06/22/2019  . INTRAMEDULLARY (IM) NAIL INTERTROCHANTERIC Right 06/22/2019   Procedure: INTRAMEDULLARY (IM) NAIL INTERTROCHANTRIC;  Surgeon: Altamese [place], MD;  Location: [facility];  Service: Orthopedics;  Laterality: Right;  . REVERSE SHOULDER ARTHROPLASTY Right 01/29/2020   Procedure: RIGHT REVERSE SHOULDER ARTHROPLASTY;  Surgeon: Marchia Bond, MD;  Location: [facility];  Service: Orthopedics;  Laterality: Right;  . TOTAL HIP ARTHROPLASTY Right 02/01/2020   Procedure: TOTAL HIP ARTHROPLASTY ANTERIOR APPROACH;  Surgeon: Mcarthur Rossetti, MD;  [facility];  Service: Orthopedics;  Laterality: Right;    Allergies  Allergen Reactions  . Eggs Or Egg-Derived Products     Hives   . Zoloft [Sertraline Hcl]     Headaches     Outpatient Encounter Medications as of 04/08/2020  Medication Sig  . aspirin 81 MG  chewable tablet Chew 1 tablet (81 mg total) by mouth 2 (two) times daily.  Marland Kitchen buPROPion (WELLBUTRIN XL) 300 MG 24 hr tablet TAKE 1 TABLET BY MOUTH EVERY MORNING  . cetirizine (ZYRTEC) 10 MG tablet TAKE 1 TABLET(10 MG) BY MOUTH DAILY  . Cholecalciferol (VITAMIN D3) 125 MCG (5000 UT) TABS Take 5,000 Units by mouth daily.  Marland Kitchen EPINEPHrine 0.3  mg/0.3 mL IJ SOAJ injection Inject 0.3 mLs into the muscle daily as needed for anaphylaxis.   . ferrous sulfate 325 (65 FE) MG tablet Take 325 mg by mouth. Once A Day on Mon, Wed, Fri  . hyoscyamine (LEVSIN SL) 0.125 MG SL tablet Place 1 tablet (0.125 mg total) under the tongue every 4 (four) hours as needed.  . methocarbamol (ROBAXIN) 500 MG tablet Take 1 tablet (500 mg total) by mouth every 6 (six) hours as needed for muscle spasms.  . ondansetron (ZOFRAN) 4 MG tablet Take 1 tablet (4 mg total) by mouth every 8 (eight) hours as needed for nausea or vomiting.  Marland Kitchen oxyCODONE (ROXICODONE) 5 MG immediate release tablet Take one tablet by mouth every 4 hours as needed for moderate pain; Take two tablets by mouth every 4 hours as needed for severe pain.  . pantoprazole (PROTONIX) 40 MG tablet Take 40 mg by mouth daily.  Marland Kitchen PARoxetine (PAXIL) 10 MG tablet Take 10 mg by mouth daily.  . polyethylene glycol (MIRALAX / GLYCOLAX) 17 g packet Take 17 g by mouth daily as needed for moderate constipation or severe constipation.  . senna-docusate (SENOKOT-S) 8.6-50 MG tablet Take 1 tablet by mouth 2 (two) times daily.  Marland Kitchen triamcinolone cream (KENALOG) 0.1 % Apply 1 application topically 2 (two) times daily.   No facility-administered encounter medications on file as of 04/08/2020.    Review of Systems  Constitutional: Positive for appetite change and unexpected weight change.  HENT: Positive for rhinorrhea.   Respiratory: Negative.   Cardiovascular: Negative.   Gastrointestinal: Negative.   Genitourinary: Negative.   Musculoskeletal: Positive for gait problem.  Neurological: Positive for weakness.  Psychiatric/Behavioral: Positive for dysphoric mood. The patient is nervous/anxious.     Immunization History  Administered Date(s) Administered  . Moderna SARS-COVID-2 Vaccination 02/29/2020  . Pneumococcal Conjugate-13 10/11/2014  . Pneumococcal Polysaccharide-23 09/19/2012  . Tdap 09/13/2011  . Zoster  09/13/2011   Pertinent  Health Maintenance Due  Topic Date Due  . PNA vac Low Risk Adult (2 of 2 - PPSV23) 09/19/2017  . INFLUENZA VACCINE  07/06/2020  . COLONOSCOPY  12/29/2020  . MAMMOGRAM  12/02/2021  . DEXA SCAN  Completed   Fall Risk  06/19/2019 06/12/2018 07/30/2016  Falls in the past year? 1 No No  Comment - - Emmi Telephone Survey: data to providers prior to load  Number falls in past yr: 1 - -  Injury with Fall? 0 - -  Risk for fall due to : History of fall(s);Impaired balance/gait - -  Follow up Falls evaluation completed - -   Functional Status Survey:    Vitals:   04/08/20 1220  BP: 110/80  Pulse: 66  Resp: 18  Temp: 98.1 F (36.7 C)  TempSrc: Oral  SpO2: 96%  Weight: 189 lb 9.6 oz (86 kg)  Height: 5\' 4"  (1.626 m)   Body mass index is 32.54 kg/m. Physical Exam  Constitutional: Oriented to person, place, and time. Well-developed and well-nourished.  HENT:  Head: Normocephalic.  Mouth/Throat: Oropharynx is clear and moist.  Eyes: Pupils are equal, round, and reactive  to light.  Neck: Neck supple.  Cardiovascular: Normal rate and normal heart sounds.  No murmur heard. Pulmonary/Chest: Effort normal and breath sounds normal. No respiratory distress. No wheezes. She has no rales.  Abdominal: Soft. Bowel sounds are normal. No distension. There is no tenderness. There is no rebound.  Musculoskeletal: No edema.  Lymphadenopathy: none Neurological: Alert and oriented to person, place, and time.  Good strength in Right lower leg. Walking with walker Skin: Skin is warm and dry.  Psychiatric: Normal mood and affect. Behavior is normal. Thought content normal.    Labs reviewed: Recent Labs    06/24/19 0338 06/24/19 0338 06/25/19 0431 06/25/19 0431 06/26/19 0241 06/26/19 0241 01/28/20 1400 01/28/20 1400 01/30/20 0422 02/02/20 0343 02/14/20 0000  NA 134*   < > 137   < > 136   < > 138   < > 135 137 137  K 4.1   < > 3.8   < > 3.6   < > 4.2   < > 4.9 4.7  4.3  CL 102   < > 104   < > 104   < > 107   < > 104 104 102  CO2 24   < > 24   < > 25   < > 22   < > 24 26 29*  GLUCOSE 116*   < > 111*   < > 105*   < > 131*  --  115* 120*  --   BUN 15   < > 19   < > 24*   < > 25*   < > 23 18 10   CREATININE 0.81   < > 0.83   < > 0.85   < > 0.84   < > 0.83 0.66 0.7  CALCIUM 8.7*   < > 8.9   < > 9.1   < > 9.6   < > 8.4* 8.6* 9.3  MG 2.0  --  1.9  --  2.1  --   --   --   --   --   --    < > = values in this interval not displayed.   Recent Labs    06/19/19 1121 02/14/20 0000  AST 8 6*  ALT 6 5*  ALKPHOS 91 140*  BILITOT 0.6  --   PROT 6.4  --   ALBUMIN 4.4 3.6   Recent Labs    06/19/19 1121 06/21/19 2308 06/22/19 1633 06/23/19 0359 02/03/20 0901 02/03/20 0901 02/04/20 0418 02/05/20 0324 02/14/20 0000  WBC 7.8   < > 14.7*   < > 10.0   < > 10.2 9.7 9.9  NEUTROABS 4.5  --  12.4*  --   --   --   --   --  6,791  HGB 14.5   < > 14.2   < > 7.6*   < > 7.0* 9.2* 10.5*  HCT 43.8   < > 43.4   < > 22.7*   < > 22.8* 28.5* 33*  MCV 93.1   < > 93.7   < > 91.2  --  96.6 94.4  --   PLT 284.0   < > 276   < > 194   < > 192 229 524*   < > = values in this interval not displayed.   Lab Results  Component Value Date   TSH 2.52 06/19/2019   No results found for: HGBA1C Lab Results  Component Value Date   CHOL 242 (H) 06/19/2019  HDL 45.50 06/19/2019   LDLCALC 157 (H) 06/19/2019   LDLDIRECT 208.4 08/17/2007   TRIG 196.0 (H) 06/19/2019   CHOLHDL 5 06/19/2019    Significant Diagnostic Results in last 30 days:  XR HIP UNILAT W OR W/O PELVIS 1V RIGHT  Result Date: 03/27/2020 A low AP pelvis and lateral the right hip shows a well-seated total hip arthroplasty with no complicating features.   Assessment/Plan  Total Right Hip Arthroplasty and S/P reverse total shoulder arthroplasty, right Doing well On Aspirin Pain Controlled on Robaxin and  Oxycodone  Anxiety and depression Ativan QD helping Continue  Also ON Wellbutrin and Paxil for long  time No Change right now Anemia, unspecified type On Iron Last hgb 11.1 Pneumonia Was treated few weeks ago Doing well GERD On Prilosec Osteoporosis ON Vit D  Plan for treatment by her PCP Discharge Planning Plan to go back to her Home with help of her Daughter  Family/ staff Communication:   Labs/tests ordered:    Total time spent in this patient care encounter was  25_  minutes; greater than 50% of the visit spent counseling patient and staff, reviewing records , Labs and coordinating care for problems addressed at this encounter.

## 2020-04-09 DIAGNOSIS — S42201D Unspecified fracture of upper end of right humerus, subsequent encounter for fracture with routine healing: Secondary | ICD-10-CM | POA: Diagnosis not present

## 2020-04-23 ENCOUNTER — Other Ambulatory Visit: Payer: Self-pay | Admitting: *Deleted

## 2020-04-23 MED ORDER — LORAZEPAM 0.5 MG PO TABS: ORAL_TABLET | ORAL | 0 refills | Status: DC

## 2020-04-23 NOTE — Telephone Encounter (Signed)
Received refill Request from [facility] Medication was not listed in Current medication list.  Reviewed Dr. Steve Rattler last [place] note:  Anxiety and depression Ativan QD helping Continue  Also ON Wellbutrin and Paxil for long time No Change right now   Pended Rx and sent to Dr. Lyndel Safe for approval.

## 2020-04-24 ENCOUNTER — Non-Acute Institutional Stay (SKILLED_NURSING_FACILITY): Payer: Medicare Other | Admitting: Internal Medicine

## 2020-04-24 ENCOUNTER — Ambulatory Visit: Payer: Medicare Other | Admitting: Orthopaedic Surgery

## 2020-04-24 ENCOUNTER — Encounter: Payer: Self-pay | Admitting: Internal Medicine

## 2020-04-24 DIAGNOSIS — D649 Anemia, unspecified: Secondary | ICD-10-CM | POA: Diagnosis not present

## 2020-04-24 DIAGNOSIS — Z96641 Presence of right artificial hip joint: Secondary | ICD-10-CM

## 2020-04-24 DIAGNOSIS — Z96611 Presence of right artificial shoulder joint: Secondary | ICD-10-CM

## 2020-04-24 DIAGNOSIS — F419 Anxiety disorder, unspecified: Secondary | ICD-10-CM | POA: Diagnosis not present

## 2020-04-24 DIAGNOSIS — F32A Depression, unspecified: Secondary | ICD-10-CM

## 2020-04-24 DIAGNOSIS — F329 Major depressive disorder, single episode, unspecified: Secondary | ICD-10-CM

## 2020-04-24 DIAGNOSIS — K219 Gastro-esophageal reflux disease without esophagitis: Secondary | ICD-10-CM

## 2020-04-24 NOTE — Progress Notes (Signed)
Location:   [place] Room Number: 35 Place of Service:  SNF [phone])  Provider: Veleta Miners MD   PCP: Laurey Morale, MD Patient Care Team: Laurey Morale, MD as PCP - General (Family Medicine)  Extended Emergency Contact Information Primary Emergency Contact: [place] Phone: [phone] Mobile Phone: [phone] Relation: Son Secondary Emergency Contact: Rulon Eisenmenger [place] Phone: [phone] Mobile Phone: [phone] Relation: Daughter  Code Status: Full Code Goals of care:  Advanced Directive information Advanced Directives 01/30/2020  Does Patient Have a Medical Advance Directive? -  Type of Advance Directive -  Does patient want to make changes to medical advance directive? No - Patient declined  Copy of [place] in Chart? -  Would patient like information on creating a medical advance directive? -     Allergies  Allergen Reactions  . Eggs Or Egg-Derived Products     Hives   . Zoloft [Sertraline Hcl]     Headaches     Chief Complaint  Patient presents with  . Discharge Note    Discharge from SNF    HPI:  74 y.o. female      Past Medical History:  Diagnosis Date  . Colon polyps 09-25-2007   Colonoscopy  . Depression   . Diverticulosis of colon (without mention of hemorrhage) 09-25-2007   Colonoscopy  . GERD (gastroesophageal reflux disease)   . Gynecological examination    sees Dr. Carren Rang   . Hyperlipidemia     Past Surgical History:  Procedure Laterality Date  . COLONOSCOPY  12/30/2015   per Dr. Henrene Pastor, benign  polyps, repeat in 5  yrs.   . ESOPHAGOGASTRODUODENOSCOPY  06-30-12   per Dr. Sharlett Iles, reflux but no Barretts seen   . HARDWARE REMOVAL Right 02/01/2020   Procedure: HARDWARE REMOVAL;  Surgeon: Mcarthur Rossetti, MD;  Location: [facility];  Service: Orthopedics;  Laterality: Right;  . INTRAMEDULLARY (IM) NAIL INTERTROCHANTERIC Right 06/22/2019  .  INTRAMEDULLARY (IM) NAIL INTERTROCHANTERIC Right 06/22/2019   Procedure: INTRAMEDULLARY (IM) NAIL INTERTROCHANTRIC;  Surgeon: Altamese Steele, MD;  Location: [facility];  Service: Orthopedics;  Laterality: Right;  . REVERSE SHOULDER ARTHROPLASTY Right 01/29/2020   Procedure: RIGHT REVERSE SHOULDER ARTHROPLASTY;  Surgeon: Marchia Bond, MD;  Location: [facility];  Service: Orthopedics;  Laterality: Right;  . TOTAL HIP ARTHROPLASTY Right 02/01/2020   Procedure: TOTAL HIP ARTHROPLASTY ANTERIOR APPROACH;  Surgeon: Mcarthur Rossetti, MD;  Location: [facility];  Service: Orthopedics;  Laterality: Right;      reports that she has been smoking cigarettes. She has a 15.00 pack-year smoking history. She has never used smokeless tobacco. She reports current alcohol use. She reports that she does not use drugs. Social History   Socioeconomic History  . Marital status: Married    Spouse name: Not on file  . Number of children: 2  . Years of education: Not on file  . Highest education level: Not on file  Occupational History  . Occupation: retired Pharmacist, hospital  Tobacco Use  . Smoking status: Current Every Day Smoker    Packs/day: 0.50    Years: 30.00    Pack years: 15.00    Types: Cigarettes  . Smokeless tobacco: Never Used  . Tobacco comment: or less  Substance and Sexual Activity  . Alcohol use: Yes    Alcohol/week: 0.0 standard drinks    Comment: occasional  . Drug use: No  . Sexual activity: Not on file  Other Topics Concern  .  Not on file  Social History Narrative  . Not on file   Social Determinants of Health   Financial Resource Strain:   . Difficulty of Paying Living Expenses:   Food Insecurity:   . Worried About Charity fundraiser in the Last Year:   . Arboriculturist in the Last Year:   Transportation Needs:   . Film/video editor (Medical):   Marland Kitchen Lack of Transportation (Non-Medical):   Physical Activity:   . Days of Exercise per Week:   . Minutes of Exercise per Session:   Stress:    . Feeling of Stress :   Social Connections:   . Frequency of Communication with Friends and Family:   . Frequency of Social Gatherings with Friends and Family:   . Attends Religious Services:   . Active Member of Clubs or Organizations:   . Attends Archivist Meetings:   Marland Kitchen Marital Status:   Intimate Partner Violence:   . Fear of Current or Ex-Partner:   . Emotionally Abused:   Marland Kitchen Physically Abused:   . Sexually Abused:    Functional Status Survey:    Allergies  Allergen Reactions  . Eggs Or Egg-Derived Products     Hives   . Zoloft [Sertraline Hcl]     Headaches     Pertinent  Health Maintenance Due  Topic Date Due  . PNA vac Low Risk Adult (2 of 2 - PPSV23) 09/19/2017  . INFLUENZA VACCINE  07/06/2020  . COLONOSCOPY  12/29/2020  . MAMMOGRAM  12/02/2021  . DEXA SCAN  Completed    Medications: Allergies as of 04/24/2020      Reactions   Eggs Or Egg-derived Products    Hives   Zoloft [sertraline Hcl]    Headaches       Medication List       Accurate as of Apr 24, 2020  4:08 PM. If you have any questions, ask your nurse or doctor.        aspirin 81 MG chewable tablet Chew 1 tablet (81 mg total) by mouth 2 (two) times daily.   Biofreeze 4 % Gel Generic drug: Menthol (Topical Analgesic) Apply topically. APPLY TO RIGHT SHOULDER Twice A Day - PRN   buPROPion 300 MG 24 hr tablet Commonly known as: WELLBUTRIN XL TAKE 1 TABLET BY MOUTH EVERY MORNING   cetirizine 10 MG tablet Commonly known as: ZYRTEC TAKE 1 TABLET(10 MG) BY MOUTH DAILY   EPINEPHrine 0.3 mg/0.3 mL Soaj injection Commonly known as: EPI-PEN Inject 0.3 mLs into the muscle daily as needed for anaphylaxis.   ferrous sulfate 325 (65 FE) MG tablet Take 325 mg by mouth. Once A Day on Mon, Wed, Fri   hyoscyamine 0.125 MG SL tablet Commonly known as: LEVSIN SL Place 1 tablet (0.125 mg total) under the tongue every 4 (four) hours as needed.   LORazepam 0.5 MG tablet Commonly known  as: ATIVAN Take one tablet by mouth once daily as needed for anxiety   methocarbamol 500 MG tablet Commonly known as: ROBAXIN Take 1 tablet (500 mg total) by mouth every 6 (six) hours as needed for muscle spasms.   ondansetron 4 MG tablet Commonly known as: Zofran Take 1 tablet (4 mg total) by mouth every 8 (eight) hours as needed for nausea or vomiting.   oxycodone 5 MG capsule Commonly known as: OXY-IR Take 10 mg by mouth every 4 (four) hours as needed.   oxyCODONE 5 MG immediate release tablet Commonly known  as: Roxicodone Take one tablet by mouth every 4 hours as needed for moderate pain; Take two tablets by mouth every 4 hours as needed for severe pain.   pantoprazole 40 MG tablet Commonly known as: PROTONIX Take 40 mg by mouth daily.   PARoxetine 10 MG tablet Commonly known as: PAXIL Take 10 mg by mouth daily.   polyethylene glycol 17 g packet Commonly known as: MIRALAX / GLYCOLAX Take 17 g by mouth daily as needed for moderate constipation or severe constipation.   sennosides-docusate sodium 8.6-50 MG tablet Commonly known as: SENOKOT-S Take 2 tablets by mouth daily. What changed: Another medication with the same name was removed. Continue taking this medication, and follow the directions you see here. Changed by: Virgie Dad, MD   triamcinolone cream 0.1 % Commonly known as: KENALOG Apply 1 application topically 2 (two) times daily.   Vitamin D3 125 MCG (5000 UT) Tabs Take 5,000 Units by mouth daily.       Review of Systems  Vitals:   04/24/20 1551  BP: 140/82  Pulse: 80  Resp: 20  Temp: 98.2 F (36.8 C)  SpO2: 95%  Weight: 189 lb 9.6 oz (86 kg)  Height: 5\' 4"  (1.626 m)   Body mass index is 32.54 kg/m. Physical Exam  Labs reviewed: Basic Metabolic Panel: Recent Labs    06/24/19 0338 06/24/19 0338 06/25/19 0431 06/25/19 0431 06/26/19 0241 06/26/19 0241 01/28/20 1400 01/28/20 1400 01/30/20 0422 01/30/20 0422 02/02/20 0343  02/14/20 0000 03/11/20 0000  NA 134*   < > 137   < > 136   < > 138   < > 135   < > 137 137 133*  K 4.1   < > 3.8   < > 3.6   < > 4.2   < > 4.9   < > 4.7 4.3 4.2  CL 102   < > 104   < > 104   < > 107   < > 104   < > 104 102 100  CO2 24   < > 24   < > 25   < > 22   < > 24   < > 26 29* 27*  GLUCOSE 116*   < > 111*   < > 105*   < > 131*  --  115*  --  120*  --   --   BUN 15   < > 19   < > 24*   < > 25*   < > 23   < > 18 10 18   CREATININE 0.81   < > 0.83   < > 0.85   < > 0.84   < > 0.83   < > 0.66 0.7 0.7  CALCIUM 8.7*   < > 8.9   < > 9.1   < > 9.6   < > 8.4*   < > 8.6* 9.3 9.4  MG 2.0  --  1.9  --  2.1  --   --   --   --   --   --   --   --    < > = values in this interval not displayed.   Liver Function Tests: Recent Labs    06/19/19 1121 02/14/20 0000 03/11/20 0000  AST 8 6* 5*  ALT 6 5* 3*  ALKPHOS 91 140* 110  BILITOT 0.6  --   --   PROT 6.4  --   --   ALBUMIN 4.4 3.6  3.4*   No results for input(s): LIPASE, AMYLASE in the last 8760 hours. No results for input(s): AMMONIA in the last 8760 hours. CBC: Recent Labs    06/22/19 1633 06/23/19 0359 02/03/20 0901 02/03/20 0901 02/04/20 0418 02/04/20 0418 02/05/20 0324 02/14/20 0000 03/11/20 0000  WBC 14.7*   < > 10.0   < > 10.2   < > 9.7 9.9 12.0  NEUTROABS 12.4*  --   --   --   --   --   --  6,791 9,408  HGB 14.2   < > 7.6*   < > 7.0*   < > 9.2* 10.5* 11.1*  HCT 43.4   < > 22.7*   < > 22.8*   < > 28.5* 33* 33*  MCV 93.7   < > 91.2  --  96.6  --  94.4  --   --   PLT 276   < > 194   < > 192   < > 229 524* 236   < > = values in this interval not displayed.   Cardiac Enzymes: No results for input(s): CKTOTAL, CKMB, CKMBINDEX, TROPONINI in the last 8760 hours. BNP: Invalid input(s): POCBNP CBG: Recent Labs    02/01/20 1030  GLUCAP 130*    Procedures and Imaging Studies During Stay: XR HIP UNILAT W OR W/O PELVIS 1V RIGHT  Result Date: 03/27/2020 A low AP pelvis and lateral the right hip shows a well-seated total hip  arthroplasty with no complicating features.   Assessment/Plan:   There are no diagnoses linked to this encounter.   Patient is being discharged with the following home health services:    Patient is being discharged with the following durable medical equipment:    Patient has been advised to f/u with their PCP in 1-2 weeks to bring them up to date on their rehab stay.  Social services at facility was responsible for arranging this appointment.  Pt was provided with a 30 day supply of prescriptions for medications and refills must be obtained from their PCP.  For controlled substances, a more limited supply may be provided adequate until PCP appointment only.  Future labs/tests needed:

## 2020-04-24 NOTE — Progress Notes (Signed)
Location: [facility] Room Number: 35 Place of Service:  SNF ([id number])  Provider:   Code Status:  Goals of Care:  Advanced Directives 01/30/2020  Does Patient Have a Medical Advance Directive? -  Type of Advance Directive -  Does patient want to make changes to medical advance directive? No - Patient declined  Copy of [place] in Chart? -  Would patient like information on creating a medical advance directive? -     Chief Complaint  Patient presents with  . Discharge Note    Discharge from SNF    HPI: Patient is a 74 y.o. female seen today for an acute visit for Discharge from SNF to [facility].  Patient has h/o Depression , Osteoporosis, GERD, S/P Right Hip Fracture in 7/20 She was admitted in the hospital 2/23-3/3 for Right THR and Right Reverse Total Shoulder Arthroplasty Patient had a mechanical fall at home when she wasgettingher Garbage. She sustained a proximal humerus fracture. Was admitted electively after 2 days fortotal shoulder arthroplasty. During that she also was complaining of right hip pain and it was decided that she needs a total hip replacement to. She tolerated both procedures well. She did have anemia postop which was treated with transfusion.  Patient has done very well. She is walking with a walker with mild assist. Still feels little weak in her right leg. She states that it does hurt and she needs oxycodone at night to help her relax. Also using Robaxin as needed and Ativan for her anxiety Patient is planning to go to AL for a month before her discharge home. Past Medical History:  Diagnosis Date  . Colon polyps 09-25-2007   Colonoscopy  . Depression   . Diverticulosis of colon (without mention of hemorrhage) 09-25-2007   Colonoscopy  . GERD (gastroesophageal reflux disease)   . Gynecological examination    sees Dr. Carren Rang   . Hyperlipidemia     Past Surgical History:  Procedure Laterality Date  .  COLONOSCOPY  12/30/2015   per Dr. Henrene Pastor, benign  polyps, repeat in 5  yrs.   . ESOPHAGOGASTRODUODENOSCOPY  06-30-12   per Dr. Sharlett Iles, reflux but no Barretts seen   . HARDWARE REMOVAL Right 02/01/2020   Procedure: HARDWARE REMOVAL;  Surgeon: Mcarthur Rossetti, MD;  Location: [facility];  Service: Orthopedics;  Laterality: Right;  . INTRAMEDULLARY (IM) NAIL INTERTROCHANTERIC Right 06/22/2019  . INTRAMEDULLARY (IM) NAIL INTERTROCHANTERIC Right 06/22/2019   Procedure: INTRAMEDULLARY (IM) NAIL INTERTROCHANTRIC;  Surgeon: Altamese Woodruff, MD;  Location: [facility] [place];  Service: Orthopedics;  Laterality: Right;  . REVERSE SHOULDER ARTHROPLASTY Right 01/29/2020   Procedure: RIGHT REVERSE SHOULDER ARTHROPLASTY;  Surgeon: Marchia Bond, MD;  Location: [facility];  Service: Orthopedics;  Laterality: Right;  . TOTAL HIP ARTHROPLASTY Right 02/01/2020   Procedure: TOTAL HIP ARTHROPLASTY ANTERIOR APPROACH;  Surgeon: Mcarthur Rossetti, MD;  Location: [facility];  Service: Orthopedics;  Laterality: Right;    Allergies  Allergen Reactions  . Eggs Or Egg-Derived Products     Hives   . Zoloft [Sertraline Hcl]     Headaches     Outpatient Encounter Medications as of 04/24/2020  Medication Sig  . aspirin 81 MG chewable tablet Chew 1 tablet (81 mg total) by mouth 2 (two) times daily. (Patient taking differently: Chew 81 mg by mouth once. )  . buPROPion (WELLBUTRIN XL) 300 MG 24 hr tablet TAKE 1 TABLET BY MOUTH EVERY MORNING  . cetirizine (ZYRTEC) 10 MG tablet TAKE  1 TABLET(10 MG) BY MOUTH DAILY  . Cholecalciferol (VITAMIN D3) 125 MCG (5000 UT) TABS Take 5,000 Units by mouth daily.  Marland Kitchen EPINEPHrine 0.3 mg/0.3 mL IJ SOAJ injection Inject 0.3 mLs into the muscle daily as needed for anaphylaxis.   . ferrous sulfate 325 (65 FE) MG tablet Take 325 mg by mouth. Once A Day on Mon, Wed, Fri  . hyoscyamine (LEVSIN SL) 0.125 MG SL tablet Place 1 tablet (0.125 mg total) under the tongue every 4 (four) hours as needed.  Marland Kitchen LORazepam  (ATIVAN) 0.5 MG tablet Take one tablet by mouth once daily as needed for anxiety (Patient taking differently: 0.5 mg at bedtime. )  . Menthol, Topical Analgesic, (BIOFREEZE) 4 % GEL Apply topically. APPLY TO RIGHT SHOULDER Twice A Day - PRN  . methocarbamol (ROBAXIN) 500 MG tablet Take 1 tablet (500 mg total) by mouth every 6 (six) hours as needed for muscle spasms.  . ondansetron (ZOFRAN) 4 MG tablet Take 1 tablet (4 mg total) by mouth every 8 (eight) hours as needed for nausea or vomiting.  Marland Kitchen oxycodone (OXY-IR) 5 MG capsule Take 5 mg by mouth at bedtime as needed.  . pantoprazole (PROTONIX) 40 MG tablet Take 40 mg by mouth daily.  Marland Kitchen PARoxetine (PAXIL) 10 MG tablet Take 10 mg by mouth daily.  . polyethylene glycol (MIRALAX / GLYCOLAX) 17 g packet Take 17 g by mouth daily as needed for moderate constipation or severe constipation.  . sennosides-docusate sodium (SENOKOT-S) 8.6-50 MG tablet Take 2 tablets by mouth daily.  Marland Kitchen triamcinolone cream (KENALOG) 0.1 % Apply 1 application topically 2 (two) times daily.  . [DISCONTINUED] oxycodone (OXY-IR) 5 MG capsule Take 10 mg by mouth every 4 (four) hours as needed.  . [DISCONTINUED] oxyCODONE (ROXICODONE) 5 MG immediate release tablet Take one tablet by mouth every 4 hours as needed for moderate pain; Take two tablets by mouth every 4 hours as needed for severe pain.  . [DISCONTINUED] senna-docusate (SENOKOT-S) 8.6-50 MG tablet Take 1 tablet by mouth 2 (two) times daily. (Patient taking differently: Take 2 tablets by mouth daily. )   No facility-administered encounter medications on file as of 04/24/2020.    Review of Systems:  Review of Systems  Health Maintenance  Topic Date Due  . PNA vac Low Risk Adult (2 of 2 - PPSV23) 09/19/2017  . COVID-19 Vaccine (2 - Moderna 2-dose series) 03/28/2020  . INFLUENZA VACCINE  07/06/2020  . COLONOSCOPY  12/29/2020  . TETANUS/TDAP  09/12/2021  . MAMMOGRAM  12/02/2021  . DEXA SCAN  Completed  . Hepatitis C  Screening  Completed    Physical Exam: Vitals:   04/24/20 1551  BP: 140/82  Pulse: 80  Resp: 20  Temp: 98.2 F (36.8 C)  SpO2: 95%  Weight: 189 lb 9.6 oz (86 kg)  Height: 5\' 4"  (1.626 m)   Body mass index is 32.54 kg/m. Physical Exam Constitutional: Oriented to person, place, and time. Well-developed and well-nourished.  HENT:  Head: Normocephalic.  Mouth/Throat: Oropharynx is clear and moist.  Eyes: Pupils are equal, round, and reactive to light.  Neck: Neck supple.  Cardiovascular: Normal rate and normal heart sounds.  No murmur heard. Pulmonary/Chest: Effort normal and breath sounds normal. No respiratory distress. No wheezes. She has no rales.  Abdominal: Soft. Bowel sounds are normal. No distension. There is no tenderness. There is no rebound.  Musculoskeletal: Mild Edema in Right leg Lymphadenopathy: none Neurological: Alert and oriented to person, place, and time.  Skin:  Skin is warm and dry.  Psychiatric: Normal mood and affect. Behavior is normal. Thought content normal.    Labs reviewed: Basic Metabolic Panel: Recent Labs    06/19/19 1121 06/21/19 2308 06/24/19 0338 06/24/19 0338 06/25/19 0431 06/25/19 0431 06/26/19 0241 06/26/19 0241 01/28/20 1400 01/28/20 1400 01/30/20 0422 01/30/20 0422 02/02/20 0343 02/14/20 0000 03/11/20 0000  NA 137   < > 134*   < > 137   < > 136   < > 138   < > 135   < > 137 137 133*  K 4.2   < > 4.1   < > 3.8   < > 3.6   < > 4.2   < > 4.9   < > 4.7 4.3 4.2  CL 102   < > 102   < > 104   < > 104   < > 107   < > 104   < > 104 102 100  CO2 26   < > 24   < > 24   < > 25   < > 22   < > 24   < > 26 29* 27*  GLUCOSE 95   < > 116*   < > 111*   < > 105*   < > 131*  --  115*  --  120*  --   --   BUN 14   < > 15   < > 19   < > 24*   < > 25*   < > 23   < > 18 10 18   CREATININE 0.91   < > 0.81   < > 0.83   < > 0.85   < > 0.84   < > 0.83   < > 0.66 0.7 0.7  CALCIUM 9.5   < > 8.7*   < > 8.9   < > 9.1   < > 9.6   < > 8.4*   < > 8.6* 9.3  9.4  MG  --    < > 2.0  --  1.9  --  2.1  --   --   --   --   --   --   --   --   TSH 2.52  --   --   --   --   --   --   --   --   --   --   --   --   --   --    < > = values in this interval not displayed.   Liver Function Tests: Recent Labs    06/19/19 1121 02/14/20 0000 03/11/20 0000  AST 8 6* 5*  ALT 6 5* 3*  ALKPHOS 91 140* 110  BILITOT 0.6  --   --   PROT 6.4  --   --   ALBUMIN 4.4 3.6 3.4*   No results for input(s): LIPASE, AMYLASE in the last 8760 hours. No results for input(s): AMMONIA in the last 8760 hours. CBC: Recent Labs    06/22/19 1633 06/23/19 0359 02/03/20 0901 02/03/20 0901 02/04/20 0418 02/04/20 0418 02/05/20 0324 02/14/20 0000 03/11/20 0000  WBC 14.7*   < > 10.0   < > 10.2   < > 9.7 9.9 12.0  NEUTROABS 12.4*  --   --   --   --   --   --  6,791 9,408  HGB 14.2   < > 7.6*   < > 7.0*   < >  9.2* 10.5* 11.1*  HCT 43.4   < > 22.7*   < > 22.8*   < > 28.5* 33* 33*  MCV 93.7   < > 91.2  --  96.6  --  94.4  --   --   PLT 276   < > 194   < > 192   < > 229 524* 236   < > = values in this interval not displayed.   Lipid Panel: Recent Labs    06/19/19 1121  CHOL 242*  HDL 45.50  LDLCALC 157*  TRIG 196.0*  CHOLHDL 5   No results found for: HGBA1C  Procedures since last visit: XR HIP UNILAT W OR W/O PELVIS 1V RIGHT  Result Date: 03/27/2020 A low AP pelvis and lateral the right hip shows a well-seated total hip arthroplasty with no complicating features.   Assessment/Plan  Total Right Hip Arthroplasty andS/P reverse total shoulder arthroplasty, right Discharge to AL Continue to work with therapy Change Aspirin to QD Change Oxycodone to QD PRN Continue Robaxin Doing well with walker  Anxiety and depression Started on Ativan QHS Continue Wellbutrin and Paxil Anemia, unspecified type Stable on Iron Pneumonia Was treated few weeks ago Doing well GERD On Prilosec Osteoporosis ON Vit D  Plan for treatment by her PCP Discharge  Planning Patient would go to AL and then home would follow with her PCP  Labs/tests ordered:  Will repeat CBC and CMP in 2 weeks Next appt:  Visit date not found

## 2020-04-25 ENCOUNTER — Other Ambulatory Visit: Payer: Self-pay | Admitting: *Deleted

## 2020-04-25 MED ORDER — OXYCODONE HCL 5 MG PO CAPS: 5.0000 mg | ORAL_CAPSULE | Freq: Every evening | ORAL | 0 refills | Status: DC | PRN

## 2020-04-25 NOTE — Telephone Encounter (Signed)
Received refill Request from [facility] Pended Rx and sent to Dr. Gupta for approval.  

## 2020-04-30 ENCOUNTER — Encounter: Payer: Self-pay | Admitting: Nurse Practitioner

## 2020-04-30 ENCOUNTER — Encounter: Payer: Self-pay | Admitting: Orthopaedic Surgery

## 2020-04-30 ENCOUNTER — Other Ambulatory Visit: Payer: Self-pay

## 2020-04-30 ENCOUNTER — Ambulatory Visit (INDEPENDENT_AMBULATORY_CARE_PROVIDER_SITE_OTHER): Payer: Medicare Other | Admitting: Orthopaedic Surgery

## 2020-04-30 ENCOUNTER — Non-Acute Institutional Stay: Payer: Medicare Other | Admitting: Nurse Practitioner

## 2020-04-30 DIAGNOSIS — K581 Irritable bowel syndrome with constipation: Secondary | ICD-10-CM

## 2020-04-30 DIAGNOSIS — K219 Gastro-esophageal reflux disease without esophagitis: Secondary | ICD-10-CM

## 2020-04-30 DIAGNOSIS — M159 Polyosteoarthritis, unspecified: Secondary | ICD-10-CM | POA: Insufficient documentation

## 2020-04-30 DIAGNOSIS — D500 Iron deficiency anemia secondary to blood loss (chronic): Secondary | ICD-10-CM

## 2020-04-30 DIAGNOSIS — Z96641 Presence of right artificial hip joint: Secondary | ICD-10-CM

## 2020-04-30 DIAGNOSIS — M8949 Other hypertrophic osteoarthropathy, multiple sites: Secondary | ICD-10-CM

## 2020-04-30 DIAGNOSIS — W19XXXA Unspecified fall, initial encounter: Secondary | ICD-10-CM | POA: Insufficient documentation

## 2020-04-30 DIAGNOSIS — F339 Major depressive disorder, recurrent, unspecified: Secondary | ICD-10-CM | POA: Diagnosis not present

## 2020-04-30 NOTE — Assessment & Plan Note (Addendum)
R hip, R shoulder, s/p surgical repair, prn Oxycodone, Methocarbamol available to her. F/u Ortho today

## 2020-04-30 NOTE — Assessment & Plan Note (Signed)
Stable, continue Pantoprazole.  

## 2020-04-30 NOTE — Assessment & Plan Note (Signed)
Stable, continue Fe, Hgb 11.1 03/2020

## 2020-04-30 NOTE — Progress Notes (Signed)
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  She is doing well from a hip standpoint.  She did have a mechanical fall last night and was scared about her back but overall is doing well.  My colleague Dr. Mardelle Matte replaced her right shoulder at the same hospital visit where we replaced her hip.  She says the shoulder is doing well.  On examination of her right hip I can put her right hip the range of motion and is well located and has good range of motion.  Her incisions of healed nicely.  Her leg lengths are equal.  She has good strength in the bilateral lower extremities.  At this point she will continue increase her activities as comfort allows.  I will see her back in 3 months.  At that visit like a standing low AP pelvis and lateral of her right operative hip.

## 2020-04-30 NOTE — Assessment & Plan Note (Signed)
Her mood is stable, continue Wellbutrin, Paxil, Lorazepam

## 2020-04-30 NOTE — Assessment & Plan Note (Signed)
Unsteady gait is contributory, adjusting to her new environment/surroudings/safety awareness emphasized-shower with supervision reinforced.

## 2020-04-30 NOTE — Assessment & Plan Note (Addendum)
Stable, prn Lvsin, will add FloraStor per HPOA's request. Continue MiraLax, Senokot S for constipation.

## 2020-04-30 NOTE — Progress Notes (Addendum)
Location:   [place] Room Number: 906 Place of Service:  ALF [id number]) Provider:  Liara Holm, NP   Patient Care Team: Laurey Morale, MD as PCP - General (Family Medicine)  Extended Emergency Contact Information Primary Emergency Contact: [place] Phone: [phone] Mobile Phone: [phone] Relation: Son Secondary Emergency Contact: Rulon [place] Phone: [phone] Mobile Phone: [phone] Relation: Daughter  Code Status:  FULL CODE Goals of care: Advanced Directive information Advanced Directives 04/30/2020  Does Patient Have a Medical Advance Directive? No  Type of Advance Directive -  Does patient want to make changes to medical advance directive? No - Patient declined  Copy of [place] in Chart? -  Would patient like information on creating a medical advance directive? -     Chief Complaint  Patient presents with  . Acute Visit    Fall    HPI:  Pt is a 74 y.o. female seen today for an acute visit for reported the patient's fall when the patient was found on the floor in her bathroom. The patient stated she fell when she attempted picking up hairspray after shower. No apparent injury noted, safety awareness emphasized   Hx of .depression, her mood is stable, on Paxil 10mg  qd, Lorazepam 0.5mg  hs, Wellbutrin 300mg  qd. Anemia, stable, on Fe 325mg  MWF. Hx of IBS,  prn Levsin. S/p R shoulder and hip surgery, healed, mild pain in the left shoulder sometimes, prn Methocarbamol 500mg  q6hr, prn Oxycodone 5mg . GERD, stable, on Pantoprazole 40mg  qd. Constipation, stable, on Miralax prn, Senokot S II qd   Past Medical History:  Diagnosis Date  . Colon polyps 09-25-2007   Colonoscopy  . Depression   . Diverticulosis of colon (without mention of hemorrhage) 09-25-2007   Colonoscopy  . GERD (gastroesophageal reflux disease)   . Gynecological examination    sees Dr. Carren Rang   . Hyperlipidemia      Past Surgical History:  Procedure Laterality Date  . COLONOSCOPY  12/30/2015   per Dr. Henrene Pastor, benign  polyps, repeat in 5  yrs.   . ESOPHAGOGASTRODUODENOSCOPY  06-30-12   per Dr. Sharlett Iles, reflux but no Barretts seen   . HARDWARE REMOVAL Right 02/01/2020   Procedure: HARDWARE REMOVAL;  Surgeon: Mcarthur Rossetti, MD;  Location: [facility];  Service: Orthopedics;  Laterality: Right;  . INTRAMEDULLARY (IM) NAIL INTERTROCHANTERIC Right 06/22/2019  . INTRAMEDULLARY (IM) NAIL INTERTROCHANTERIC Right 06/22/2019   Procedure: INTRAMEDULLARY (IM) NAIL INTERTROCHANTRIC;  Surgeon: Altamese [place], MD;  Location: [facility];  Service: Orthopedics;  Laterality: Right;  . REVERSE SHOULDER ARTHROPLASTY Right 01/29/2020   Procedure: RIGHT REVERSE SHOULDER ARTHROPLASTY;  Surgeon: Marchia Bond, MD;  Location: [facility];  Service: Orthopedics;  Laterality: Right;  . TOTAL HIP ARTHROPLASTY Right 02/01/2020   Procedure: TOTAL HIP ARTHROPLASTY ANTERIOR APPROACH;  Surgeon: Mcarthur Rossetti, MD;  Location: [facility];  Service: Orthopedics;  Laterality: Right;    Allergies  Allergen Reactions  . Eggs Or Egg-Derived Products     Hives   . Zoloft [Sertraline Hcl]     Headaches     Allergies as of 04/30/2020      Reactions   Eggs Or Egg-derived Products    Hives   Zoloft [sertraline Hcl]    Headaches       Medication List       Accurate as of Apr 30, 2020  2:28 PM. If you have any questions, ask your nurse or doctor.  ASPIRIN 81 PO Take 81 mg by mouth daily. What changed: Another medication with the same name was removed. Continue taking this medication, and follow the directions you see here. Changed by: Mikhala Kenan X Tabathia Knoche, NP   Biofreeze 4 % Gel Generic drug: Menthol (Topical Analgesic) Apply topically. APPLY TO RIGHT SHOULDER Twice A Day - PRN   buPROPion 300 MG 24 hr tablet Commonly known as: WELLBUTRIN XL TAKE 1 TABLET BY MOUTH EVERY MORNING   cetirizine 10 MG tablet Commonly known as:  ZYRTEC TAKE 1 TABLET(10 MG) BY MOUTH DAILY   EPINEPHrine 0.3 mg/0.3 mL Soaj injection Commonly known as: EPI-PEN Inject 0.3 mLs into the muscle daily as needed for anaphylaxis.   ferrous sulfate 325 (65 FE) MG tablet Take 325 mg by mouth. Once A Day on Mon, Wed, Fri   hyoscyamine 0.125 MG SL tablet Commonly known as: LEVSIN SL Place 1 tablet (0.125 mg total) under the tongue every 4 (four) hours as needed.   LORazepam 0.5 MG tablet Commonly known as: ATIVAN Take 0.5 mg by mouth at bedtime. What changed: Another medication with the same name was removed. Continue taking this medication, and follow the directions you see here. Changed by: Josiel Gahm X Mikhai Bienvenue, NP   methocarbamol 500 MG tablet Commonly known as: ROBAXIN Take 1 tablet (500 mg total) by mouth every 6 (six) hours as needed for muscle spasms.   ondansetron 4 MG tablet Commonly known as: Zofran Take 1 tablet (4 mg total) by mouth every 8 (eight) hours as needed for nausea or vomiting.   oxycodone 5 MG capsule Commonly known as: OXY-IR Take 1 capsule (5 mg total) by mouth at bedtime as needed.   pantoprazole 40 MG tablet Commonly known as: PROTONIX Take 40 mg by mouth daily.   PARoxetine 10 MG tablet Commonly known as: PAXIL Take 10 mg by mouth daily.   polyethylene glycol 17 g packet Commonly known as: MIRALAX / GLYCOLAX Take 17 g by mouth daily as needed for moderate constipation or severe constipation.   sennosides-docusate sodium 8.6-50 MG tablet Commonly known as: SENOKOT-S Take 2 tablets by mouth daily.   triamcinolone cream 0.1 % Commonly known as: KENALOG Apply 1 application topically 2 (two) times daily.   Vitamin D3 125 MCG (5000 UT) Tabs Take 5,000 Units by mouth daily.       Review of Systems  Constitutional: Negative for activity change, appetite change and fever.  HENT: Positive for hearing loss. Negative for congestion and voice change.   Eyes: Negative for visual disturbance.  Respiratory:  Positive for cough. Negative for shortness of breath.   Cardiovascular: Negative for chest pain, palpitations and leg swelling.  Gastrointestinal: Negative for abdominal pain, constipation and diarrhea.  Genitourinary: Negative for difficulty urinating, dysuria and urgency.  Musculoskeletal: Positive for arthralgias, gait problem and joint swelling.       Right shoulder pain with movement  Skin: Negative for color change.  Neurological: Negative for dizziness, speech difficulty and weakness.  Psychiatric/Behavioral: Negative for behavioral problems and sleep disturbance. The patient is not nervous/anxious.     Immunization History  Administered Date(s) Administered  . Moderna SARS-COVID-2 Vaccination 02/29/2020  . Pneumococcal Conjugate-13 10/11/2014  . Pneumococcal Polysaccharide-23 09/19/2012  . Tdap 09/13/2011  . Zoster 09/13/2011   Pertinent  Health Maintenance Due  Topic Date Due  . PNA vac Low Risk Adult (2 of 2 - PPSV23) 09/19/2017  . INFLUENZA VACCINE  07/06/2020  . COLONOSCOPY  12/29/2020  . MAMMOGRAM  12/02/2021  .  DEXA SCAN  Completed   Fall Risk  06/19/2019 06/12/2018 07/30/2016  Falls in the past year? 1 No No  Comment - - Emmi Telephone Survey: data to providers prior to load  Number falls in past yr: 1 - -  Injury with Fall? 0 - -  Risk for fall due to : History of fall(s);Impaired balance/gait - -  Follow up Falls evaluation completed - -   Functional Status Survey:    Vitals:   04/30/20 1154  BP: 134/90  Pulse: 68  Resp: 20  Temp: 97.9 F (36.6 C)  SpO2: 96%  Weight: 188 lb 6.4 oz (85.5 kg)  Height: 5\' 4"  (1.626 m)   Body mass index is 32.34 kg/m. Physical Exam Vitals and nursing note reviewed.  Constitutional:      Appearance: Normal appearance.  HENT:     Head: Normocephalic and atraumatic.     Mouth/Throat:     Mouth: Mucous membranes are moist.  Eyes:     Extraocular Movements: Extraocular movements intact.     Conjunctiva/sclera:  Conjunctivae normal.     Pupils: Pupils are equal, round, and reactive to light.  Cardiovascular:     Rate and Rhythm: Normal rate and regular rhythm.     Heart sounds: No murmur.  Pulmonary:     Breath sounds: No rales.  Abdominal:     General: Bowel sounds are normal. There is no distension.     Palpations: Abdomen is soft.     Tenderness: There is no abdominal tenderness.  Musculoskeletal:        General: Tenderness present.     Cervical back: Normal range of motion and neck supple.     Right lower leg: No edema.     Left lower leg: No edema.     Comments: R hip/R shoulder surgical wound healed  Skin:    General: Skin is warm and dry.  Neurological:     General: No focal deficit present.     Mental Status: She is alert and oriented to person, place, and time. Mental status is at baseline.     Motor: No weakness.     Coordination: Coordination normal.     Gait: Gait abnormal.  Psychiatric:        Mood and Affect: Mood normal.        Behavior: Behavior normal.        Thought Content: Thought content normal.     Labs reviewed: Recent Labs    06/24/19 0338 06/24/19 0338 06/25/19 0431 06/25/19 0431 06/26/19 0241 06/26/19 0241 01/28/20 1400 01/28/20 1400 01/30/20 0422 01/30/20 0422 02/02/20 0343 02/14/20 0000 03/11/20 0000  NA 134*   < > 137   < > 136   < > 138   < > 135   < > 137 137 133*  K 4.1   < > 3.8   < > 3.6   < > 4.2   < > 4.9   < > 4.7 4.3 4.2  CL 102   < > 104   < > 104   < > 107   < > 104   < > 104 102 100  CO2 24   < > 24   < > 25   < > 22   < > 24   < > 26 29* 27*  GLUCOSE 116*   < > 111*   < > 105*   < > 131*  --  115*  --  120*  --   --  BUN 15   < > 19   < > 24*   < > 25*   < > 23   < > 18 10 18   CREATININE 0.81   < > 0.83   < > 0.85   < > 0.84   < > 0.83   < > 0.66 0.7 0.7  CALCIUM 8.7*   < > 8.9   < > 9.1   < > 9.6   < > 8.4*   < > 8.6* 9.3 9.4  MG 2.0  --  1.9  --  2.1  --   --   --   --   --   --   --   --    < > = values in this interval  not displayed.   Recent Labs    06/19/19 1121 02/14/20 0000 03/11/20 0000  AST 8 6* 5*  ALT 6 5* 3*  ALKPHOS 91 140* 110  BILITOT 0.6  --   --   PROT 6.4  --   --   ALBUMIN 4.4 3.6 3.4*   Recent Labs    06/22/19 1633 06/23/19 0359 02/03/20 0901 02/03/20 0901 02/04/20 0418 02/04/20 0418 02/05/20 0324 02/14/20 0000 03/11/20 0000  WBC 14.7*   < > 10.0   < > 10.2   < > 9.7 9.9 12.0  NEUTROABS 12.4*  --   --   --   --   --   --  6,791 9,408  HGB 14.2   < > 7.6*   < > 7.0*   < > 9.2* 10.5* 11.1*  HCT 43.4   < > 22.7*   < > 22.8*   < > 28.5* 33* 33*  MCV 93.7   < > 91.2  --  96.6  --  94.4  --   --   PLT 276   < > 194   < > 192   < > 229 524* 236   < > = values in this interval not displayed.   Lab Results  Component Value Date   TSH 2.52 06/19/2019   No results found for: HGBA1C Lab Results  Component Value Date   CHOL 242 (H) 06/19/2019   HDL 45.50 06/19/2019   LDLCALC 157 (H) 06/19/2019   LDLDIRECT 208.4 08/17/2007   TRIG 196.0 (H) 06/19/2019   CHOLHDL 5 06/19/2019    Significant Diagnostic Results in last 30 days:  No results found.  Assessment/Plan Fall Unsteady gait is contributory, adjusting to her new environment/surroudings/safety awareness emphasized-shower with supervision reinforced.   Depression, recurrent ([place]) Her mood is stable, continue Wellbutrin, Paxil, Lorazepam  Blood loss anemia Stable, continue Fe, Hgb 11.1 03/2020  IBS (irritable bowel syndrome) Stable, prn Lvsin, will add FloraStor per HPOA's request. Continue MiraLax, Senokot S for constipation.   GERD Stable, continue Pantoprazole.   Osteoarthritis, multiple sites R hip, R shoulder, s/p surgical repair, prn Oxycodone, Methocarbamol available to her. F/u Ortho today     Family/ staff Communication: plan of care reviewed with the patient and charge nurse.   Labs/tests ordered:  none  Time spend 40 minutes.

## 2020-05-03 DIAGNOSIS — M545 Low back pain: Secondary | ICD-10-CM | POA: Diagnosis not present

## 2020-05-03 DIAGNOSIS — M533 Sacrococcygeal disorders, not elsewhere classified: Secondary | ICD-10-CM | POA: Diagnosis not present

## 2020-05-06 ENCOUNTER — Encounter: Payer: Self-pay | Admitting: Internal Medicine

## 2020-05-06 ENCOUNTER — Non-Acute Institutional Stay: Payer: Medicare Other | Admitting: Internal Medicine

## 2020-05-06 DIAGNOSIS — F329 Major depressive disorder, single episode, unspecified: Secondary | ICD-10-CM | POA: Diagnosis not present

## 2020-05-06 DIAGNOSIS — M545 Low back pain, unspecified: Secondary | ICD-10-CM

## 2020-05-06 DIAGNOSIS — F419 Anxiety disorder, unspecified: Secondary | ICD-10-CM | POA: Diagnosis not present

## 2020-05-06 DIAGNOSIS — Z96641 Presence of right artificial hip joint: Secondary | ICD-10-CM

## 2020-05-06 DIAGNOSIS — F32A Depression, unspecified: Secondary | ICD-10-CM

## 2020-05-06 DIAGNOSIS — W19XXXA Unspecified fall, initial encounter: Secondary | ICD-10-CM | POA: Diagnosis not present

## 2020-05-06 NOTE — Progress Notes (Signed)
Location:   [facility] Room Number: [place] of Service:  ALF [phone]) Provider:  Veleta Miners MD   Laurey Morale, MD  Patient Care Team: Laurey Morale, MD as PCP - General (Family Medicine)  Extended Emergency Contact Information Primary Emergency Contact: [place] Phone: [phone] Mobile Phone: [phone] Relation: Son Secondary Emergency Contact: Rulon Eisenmenger [place] of [place] Phone: [phone] Mobile Phone: [phone] Relation: Daughter  Code Status:  Full Code Goals of care: Advanced Directive information Advanced Directives 04/30/2020  Does Patient Have a Medical Advance Directive? No  Type of Advance Directive -  Does patient want to make changes to medical advance directive? No - Patient declined  Copy of [place] in Chart? -  Would patient like information on creating a medical advance directive? -     Chief Complaint  Patient presents with  . Acute Visit    Pain    HPI:  Pt is a 74 y.o. female seen today for an acute visit for the patient for Fall, weakness, pain And questionable confusion  Patient has h/o Depression , Osteoporosis, GERD, S/P Right Hip Fracture in 7/20 She was admitted in the hospital 2/23-3/3 for Right THR and Right Reverse Total Shoulder Arthroplasty Patient was recently transferred from SNF to [facility].  She had a fall on her first day.  Was evaluated.  But yesterday patient started having some pain especially the lower back.  X-ray was done. It did not show any acute fractures but did show chronic degenerative changes with loss of vertebral disc space The daughter also is concerned because of the fall.  She says that her mom is more weaker.  She also thinks that sometimes she is confused. Patient states that she does sometimes gets confused.  Also complaining of some dizziness which she was not very specific about seems more like weakness.  She continues to feel weak  in her right leg.  Her pain seems to be controlled.  She just had a visit with Ortho  Past Medical History:  Diagnosis Date  . Colon polyps 09-25-2007   Colonoscopy  . Depression   . Diverticulosis of colon (without mention of hemorrhage) 09-25-2007   Colonoscopy  . GERD (gastroesophageal reflux disease)   . Gynecological examination    sees Dr. Carren Rang   . Hyperlipidemia    Past Surgical History:  Procedure Laterality Date  . COLONOSCOPY  12/30/2015   per Dr. Henrene Pastor, benign  polyps, repeat in 5  yrs.   . ESOPHAGOGASTRODUODENOSCOPY  06-30-12   per Dr. Sharlett Iles, reflux but no Barretts seen   . HARDWARE REMOVAL Right 02/01/2020   Procedure: HARDWARE REMOVAL;  Surgeon: Mcarthur Rossetti, MD;  Location: [facility];  Service: Orthopedics;  Laterality: Right;  . INTRAMEDULLARY (IM) NAIL INTERTROCHANTERIC Right 06/22/2019  . INTRAMEDULLARY (IM) NAIL INTERTROCHANTERIC Right 06/22/2019   Procedure: INTRAMEDULLARY (IM) NAIL INTERTROCHANTRIC;  Surgeon: Altamese , MD;  Location: [facility];  Service: Orthopedics;  Laterality: Right;  . REVERSE SHOULDER ARTHROPLASTY Right 01/29/2020   Procedure: RIGHT REVERSE SHOULDER ARTHROPLASTY;  Surgeon: Marchia Bond, MD;  Location: [facility];  Service: Orthopedics;  Laterality: Right;  . TOTAL HIP ARTHROPLASTY Right 02/01/2020   Procedure: TOTAL HIP ARTHROPLASTY ANTERIOR APPROACH;  Surgeon: Mcarthur Rossetti, MD;  [facility]: [facility];  Service: Orthopedics;  Laterality: Right;    Allergies  Allergen Reactions  . Eggs Or Egg-Derived Products     Hives   . Zoloft [Sertraline Hcl]  Headaches     Allergies as of 05/06/2020      Reactions   Eggs Or Egg-derived Products    Hives   Zoloft [sertraline Hcl]    Headaches       Medication List       Accurate as of May 06, 2020 10:51 AM. If you have any questions, ask your nurse or doctor.        ASPIRIN 81 PO Take 81 mg by mouth daily.   Biofreeze 4 % Gel Generic drug: Menthol (Topical  Analgesic) Apply topically. APPLY TO RIGHT SHOULDER Twice A Day - PRN   buPROPion 300 MG 24 hr tablet Commonly known as: WELLBUTRIN XL TAKE 1 TABLET BY MOUTH EVERY MORNING   cetirizine 10 MG tablet Commonly known as: ZYRTEC TAKE 1 TABLET(10 MG) BY MOUTH DAILY   EPINEPHrine 0.3 mg/0.3 mL Soaj injection Commonly known as: EPI-PEN Inject 0.3 mLs into the muscle daily as needed for anaphylaxis.   ferrous sulfate 325 (65 FE) MG tablet Take 325 mg by mouth. Once A Day on Mon, Wed, Fri   hyoscyamine 0.125 MG SL tablet Commonly known as: LEVSIN SL Place 1 tablet (0.125 mg total) under the tongue every 4 (four) hours as needed.   LORazepam 0.5 MG tablet Commonly known as: ATIVAN Take 0.5 mg by mouth at bedtime.   methocarbamol 500 MG tablet Commonly known as: ROBAXIN Take 1 tablet (500 mg total) by mouth every 6 (six) hours as needed for muscle spasms.   ondansetron 4 MG tablet Commonly known as: Zofran Take 1 tablet (4 mg total) by mouth every 8 (eight) hours as needed for nausea or vomiting.   oxycodone 5 MG capsule Commonly known as: OXY-IR Take 1 capsule (5 mg total) by mouth at bedtime as needed.   pantoprazole 40 MG tablet Commonly known as: PROTONIX Take 40 mg by mouth daily.   PARoxetine 10 MG tablet Commonly known as: PAXIL Take 10 mg by mouth daily.   polyethylene glycol 17 g packet Commonly known as: MIRALAX / GLYCOLAX Take 17 g by mouth daily as needed for moderate constipation or severe constipation.   sennosides-docusate sodium 8.6-50 MG tablet Commonly known as: SENOKOT-S Take 2 tablets by mouth daily.   triamcinolone cream 0.1 % Commonly known as: KENALOG Apply 1 application topically 2 (two) times daily.   Vitamin D3 125 MCG (5000 UT) Tabs Take 5,000 Units by mouth daily.       Review of Systems  Constitutional: Positive for activity change.  HENT: Negative.   Respiratory: Negative.   Cardiovascular: Negative.   Gastrointestinal: Negative.    Genitourinary: Negative.   Musculoskeletal: Positive for gait problem.  Skin: Negative.   Neurological: Positive for weakness.  Psychiatric/Behavioral: Positive for confusion.  All other systems reviewed and are negative.   Immunization History  Administered Date(s) Administered  . Moderna SARS-COVID-2 Vaccination 02/29/2020  . Pneumococcal Conjugate-13 10/11/2014  . Pneumococcal Polysaccharide-23 09/19/2012  . Tdap 09/13/2011  . Zoster 09/13/2011   Pertinent  Health Maintenance Due  Topic Date Due  . PNA vac Low Risk Adult (2 of 2 - PPSV23) 09/19/2017  . INFLUENZA VACCINE  07/06/2020  . COLONOSCOPY  12/29/2020  . MAMMOGRAM  12/02/2021  . DEXA SCAN  Completed   Fall Risk  06/19/2019 06/12/2018 07/30/2016  Falls in the past year? 1 No No  Comment - - Emmi Telephone Survey: data to providers prior to load  Number falls in past yr: 1 - -  Injury with  Fall? 0 - -  Risk for fall due to : History of fall(s);Impaired balance/gait - -  Follow up Falls evaluation completed - -   Functional Status Survey:    Vitals:   05/06/20 1046  BP: 124/72  Pulse: 76  Resp: 18  Temp: 98.1 F (36.7 C)  SpO2: 97%  Weight: 188 lb 6.4 oz (85.5 kg)  Height: 5\' 4"  (1.626 m)   Body mass index is 32.34 kg/m. Physical Exam  Constitutional: Oriented to person, place, and time. Well-developed and well-nourished.  HENT:  Head: Normocephalic.  Mouth/Throat: Oropharynx is clear and moist.  Eyes: Pupils are equal, round, and reactive to light.  Neck: Neck supple.  Cardiovascular: Normal rate and normal heart sounds.  No murmur heard. Pulmonary/Chest: Effort normal and breath sounds normal. No respiratory distress. No wheezes. She has no rales.  Abdominal: Soft. Bowel sounds are normal. No distension. There is no tenderness. There is no rebound.  Musculoskeletal: No edema.  Lymphadenopathy: none Neurological: Alert and oriented to person, place, and time.  Her mental status seems at baseline to  me. Was able to stand up with the walker and walk.  Though she does it very closely and carefully Skin: Skin is warm and dry.  Psychiatric: Normal mood and affect. Behavior is normal. Thought content normal.    Labs reviewed: Recent Labs    06/24/19 0338 06/24/19 0338 06/25/19 0431 06/25/19 0431 06/26/19 0241 06/26/19 0241 01/28/20 1400 01/28/20 1400 01/30/20 0422 01/30/20 0422 02/02/20 0343 02/14/20 0000 03/11/20 0000  NA 134*   < > 137   < > 136   < > 138   < > 135   < > 137 137 133*  K 4.1   < > 3.8   < > 3.6   < > 4.2   < > 4.9   < > 4.7 4.3 4.2  CL 102   < > 104   < > 104   < > 107   < > 104   < > 104 102 100  CO2 24   < > 24   < > 25   < > 22   < > 24   < > 26 29* 27*  GLUCOSE 116*   < > 111*   < > 105*   < > 131*  --  115*  --  120*  --   --   BUN 15   < > 19   < > 24*   < > 25*   < > 23   < > 18 10 18   CREATININE 0.81   < > 0.83   < > 0.85   < > 0.84   < > 0.83   < > 0.66 0.7 0.7  CALCIUM 8.7*   < > 8.9   < > 9.1   < > 9.6   < > 8.4*   < > 8.6* 9.3 9.4  MG 2.0  --  1.9  --  2.1  --   --   --   --   --   --   --   --    < > = values in this interval not displayed.   Recent Labs    06/19/19 1121 02/14/20 0000 03/11/20 0000  AST 8 6* 5*  ALT 6 5* 3*  ALKPHOS 91 140* 110  BILITOT 0.6  --   --   PROT 6.4  --   --   ALBUMIN 4.4 3.6 3.4*  Recent Labs    06/22/19 1633 06/23/19 0359 02/03/20 0901 02/03/20 0901 02/04/20 0418 02/04/20 0418 02/05/20 0324 02/14/20 0000 03/11/20 0000  WBC 14.7*   < > 10.0   < > 10.2   < > 9.7 9.9 12.0  NEUTROABS 12.4*  --   --   --   --   --   --  6,791 9,408  HGB 14.2   < > 7.6*   < > 7.0*   < > 9.2* 10.5* 11.1*  HCT 43.4   < > 22.7*   < > 22.8*   < > 28.5* 33* 33*  MCV 93.7   < > 91.2  --  96.6  --  94.4  --   --   PLT 276   < > 194   < > 192   < > 229 524* 236   < > = values in this interval not displayed.   Lab Results  Component Value Date   TSH 2.52 06/19/2019   No results found for: HGBA1C Lab Results  Component  Value Date   CHOL 242 (H) 06/19/2019   HDL 45.50 06/19/2019   LDLCALC 157 (H) 06/19/2019   LDLDIRECT 208.4 08/17/2007   TRIG 196.0 (H) 06/19/2019   CHOLHDL 5 06/19/2019    Significant Diagnostic Results in last 30 days:  No results found.  Assessment/Plan Pain in Lower Back Patient states that her pain is better Will continue Tylenol and oxycodone as needed S/p fall with some concern about confusion Will repeat labs including CBC and a CMP Also will discontinue Ativan Will consider decreasing the dose of Wellbutrin Patient's daughter wanted [place] to check the B12 we will check vitamin B12 and with Methyl Malonic acid I do not see any signs of confusion.  Patient is very anxious daughter wants to know if she needs a referral to neurology.  If her weakness does not improve will consider. Total Right Hip Arthroplasty andS/P reverse total shoulder arthroplasty, right Discharged to [facility] Continue to work with therapy  Aspirin to QD  Oxycodone to QD PRN Continue Robaxin Doing well with walker  Anxiety and depression Continue Wellbutrin and Paxil Anemia, unspecified type Stable on Iron Pneumonia Was treated few weeks ago Doing well GERD On Prilosec Osteoporosis ON Vit D  Plan for treatment by her PCP Discharge Planning Patient would go to AL and then home would follow with her PCP Family/ staff Communication:  Labs/tests ordered: CBC,CMP,Vit B 12 level

## 2020-05-27 ENCOUNTER — Encounter: Payer: Self-pay | Admitting: Internal Medicine

## 2020-05-27 ENCOUNTER — Non-Acute Institutional Stay: Payer: Medicare Other | Admitting: Internal Medicine

## 2020-05-27 DIAGNOSIS — D500 Iron deficiency anemia secondary to blood loss (chronic): Secondary | ICD-10-CM

## 2020-05-27 DIAGNOSIS — Z96641 Presence of right artificial hip joint: Secondary | ICD-10-CM | POA: Diagnosis not present

## 2020-05-27 DIAGNOSIS — K219 Gastro-esophageal reflux disease without esophagitis: Secondary | ICD-10-CM

## 2020-05-27 DIAGNOSIS — K581 Irritable bowel syndrome with constipation: Secondary | ICD-10-CM | POA: Diagnosis not present

## 2020-05-27 DIAGNOSIS — Z96611 Presence of right artificial shoulder joint: Secondary | ICD-10-CM

## 2020-05-27 DIAGNOSIS — F339 Major depressive disorder, recurrent, unspecified: Secondary | ICD-10-CM | POA: Diagnosis not present

## 2020-05-27 LAB — BASIC METABOLIC PANEL
BUN: 13 (ref 4–21)
CO2: 27 — AB (ref 13–22)
Chloride: 103 (ref 99–108)
Creatinine: 1 (ref 0.5–1.1)
Glucose: 85
Potassium: 4.2 (ref 3.4–5.3)
Sodium: 138 (ref 137–147)

## 2020-05-27 LAB — COMPREHENSIVE METABOLIC PANEL
Albumin: 3.9 (ref 3.5–5.0)
Calcium: 9.9 (ref 8.7–10.7)
Globulin: 2.4

## 2020-05-27 LAB — CBC AND DIFFERENTIAL
HCT: 40 (ref 36–46)
Hemoglobin: 13.2 (ref 12.0–16.0)
Platelets: 252 (ref 150–399)
WBC: 6.7

## 2020-05-27 LAB — HEPATIC FUNCTION PANEL
ALT: 3 — AB (ref 7–35)
AST: 7 — AB (ref 13–35)
Alkaline Phosphatase: 108 (ref 25–125)
Bilirubin, Total: 0.4

## 2020-05-27 LAB — CBC: RBC: 4.47 (ref 3.87–5.11)

## 2020-05-27 LAB — VITAMIN B12: Vitamin B-12: 291

## 2020-05-27 MED ORDER — OXYCODONE HCL 5 MG PO CAPS: 5.0000 mg | ORAL_CAPSULE | Freq: Every evening | ORAL | 0 refills | Status: DC | PRN

## 2020-05-27 MED ORDER — PANTOPRAZOLE SODIUM 40 MG PO TBEC: 40.0000 mg | DELAYED_RELEASE_TABLET | Freq: Every day | ORAL | 0 refills | Status: DC

## 2020-05-27 MED ORDER — BUPROPION HCL ER (XL) 300 MG PO TB24: 300.0000 mg | ORAL_TABLET | Freq: Every morning | ORAL | 0 refills | Status: DC

## 2020-05-27 MED ORDER — EPINEPHRINE 0.3 MG/0.3ML IJ SOAJ: 0.3000 mg | Freq: Every day | INTRAMUSCULAR | 0 refills | Status: AC | PRN

## 2020-05-27 MED ORDER — PAROXETINE HCL 10 MG PO TABS: 10.0000 mg | ORAL_TABLET | Freq: Every day | ORAL | 0 refills | Status: DC

## 2020-05-27 NOTE — Progress Notes (Signed)
Location:  [name] Room Number: [place] of Service:  ALF ([phone])  Provider:Juanell Saffo MD   PCP: Laurey Morale, MD Patient Care Team: Laurey Morale, MD as PCP - General (Family Medicine)  Extended Emergency Contact Information Primary Emergency Contact: [place] Phone: [phone] Mobile Phone: [phone] Relation: Son Secondary Emergency Contact: Rulon Eisenmenger States of [place] Phone: [phone] Mobile Phone: [phone] Relation: Daughter  Code Status: Full Code Goals of care:  Advanced Directive information Advanced Directives 04/30/2020  Does Patient Have a Medical Advance Directive? No  Type of Advance Directive -  Does patient want to make changes to medical advance directive? No - Patient declined  Copy of [place] in Chart? -  Would patient like information on creating a medical advance directive? -     Allergies  Allergen Reactions  . Eggs Or Egg-Derived Products     Hives   . Zoloft [Sertraline Hcl]     Headaches     Chief Complaint  Patient presents with  . Discharge Note    Discharge    HPI:  74 y.o. female  Seen today for discharge  Patient has h/o Depression , Osteoporosis, GERD, S/P Right Hip Fracture in 7/20 She was admitted in the hospital 2/23-3/3 for Right THR and Right Reverse Total Shoulder Arthroplasty Patient had a mechanical fall at home when she wasgettingher Garbage. She sustained a proximal humerus fracture. Was admitted electively after 2 days fortotal shoulder arthroplasty. During that she also was complaining of right hip pain and it was decided that she needs a total hip replacement to. She tolerated both procedures well. She did have anemia postop which was treated with transfusion.  Did well with Therapy in the facility. Was transferred to [facility] for transition before going back home. Her active issues here were Anxiety  Needed Ativan PRN Right  hip pain and Weakness Controlled on Oxycodone pRN and Tylenol Did well with therapy Anemia Post Op Treated with Iron supplement Pneumonia Resolved with Antibiotics.  Going home with support of her daughter. Will have home PT and OT Walking without assist with her walker  Past Medical History:  Diagnosis Date  . Colon polyps 09-25-2007   Colonoscopy  . Depression   . Diverticulosis of colon (without mention of hemorrhage) 09-25-2007   Colonoscopy  . GERD (gastroesophageal reflux disease)   . Gynecological examination    sees Dr. Carren Rang   . Hyperlipidemia     Past Surgical History:  Procedure Laterality Date  . COLONOSCOPY  12/30/2015   per Dr. Henrene Pastor, benign  polyps, repeat in 5  yrs.   . ESOPHAGOGASTRODUODENOSCOPY  06-30-12   per Dr. Sharlett Iles, reflux but no Barretts seen   . HARDWARE REMOVAL Right 02/01/2020   Procedure: HARDWARE REMOVAL;  Surgeon: Mcarthur Rossetti, MD;  [facility]: [facility];  Service: Orthopedics;  Laterality: Right;  . INTRAMEDULLARY (IM) NAIL INTERTROCHANTERIC Right 06/22/2019  . INTRAMEDULLARY (IM) NAIL INTERTROCHANTERIC Right 06/22/2019   Procedure: INTRAMEDULLARY (IM) NAIL INTERTROCHANTRIC;  Surgeon: Altamese Arden, MD;  Location: [facility];  Service: Orthopedics;  Laterality: Right;  . REVERSE SHOULDER ARTHROPLASTY Right 01/29/2020   Procedure: RIGHT REVERSE SHOULDER ARTHROPLASTY;  Surgeon: Marchia Bond, MD;  Location: [facility];  Service: Orthopedics;  Laterality: Right;  . TOTAL HIP ARTHROPLASTY Right 02/01/2020   Procedure: TOTAL HIP ARTHROPLASTY ANTERIOR APPROACH;  Surgeon: Mcarthur Rossetti, MD;  [facility];  Service: Orthopedics;  Laterality: Right;      reports  that she has been smoking cigarettes. She has a 15.00 pack-year smoking history. She has never used smokeless tobacco. She reports current alcohol use. She reports that she does not use drugs. Social History   Socioeconomic History  . Marital status: Married    Spouse name: Not  on file  . Number of children: 2  . Years of education: Not on file  . Highest education level: Not on file  Occupational History  . Occupation: retired Pharmacist, hospital  Tobacco Use  . Smoking status: Current Every Day Smoker    Packs/day: 0.50    Years: 30.00    Pack years: 15.00    Types: Cigarettes  . Smokeless tobacco: Never Used  . Tobacco comment: or less  Vaping Use  . Vaping Use: Never used  Substance and Sexual Activity  . Alcohol use: Yes    Alcohol/week: 0.0 standard drinks    Comment: occasional  . Drug use: No  . Sexual activity: Not on file  Other Topics Concern  . Not on file  Social History Narrative  . Not on file   Social Determinants of Health   Financial Resource Strain:   . Difficulty of Paying Living Expenses:   Food Insecurity:   . Worried About Charity fundraiser in the Last Year:   . Arboriculturist in the Last Year:   Transportation Needs:   . Film/video editor (Medical):   Marland Kitchen Lack of Transportation (Non-Medical):   Physical Activity:   . Days of Exercise per Week:   . Minutes of Exercise per Session:   Stress:   . Feeling of Stress :   Social Connections:   . Frequency of Communication with Friends and Family:   . Frequency of Social Gatherings with Friends and Family:   . Attends Religious Services:   . Active Member of Clubs or Organizations:   . Attends Archivist Meetings:   Marland Kitchen Marital Status:   Intimate Partner Violence:   . Fear of Current or Ex-Partner:   . Emotionally Abused:   Marland Kitchen Physically Abused:   . Sexually Abused:    Functional Status Survey:    Allergies  Allergen Reactions  . Eggs Or Egg-Derived Products     Hives   . Zoloft [Sertraline Hcl]     Headaches     Pertinent  Health Maintenance Due  Topic Date Due  . PNA vac Low Risk Adult (2 of 2 - PPSV23) 09/19/2017  . INFLUENZA VACCINE  07/06/2020  . COLONOSCOPY  12/29/2020  . MAMMOGRAM  12/02/2021  . DEXA SCAN  Completed    Medications:  Outpatient Encounter Medications as of 05/27/2020  Medication Sig  . ASPIRIN 81 PO Take 81 mg by mouth daily.  Marland Kitchen buPROPion (WELLBUTRIN XL) 300 MG 24 hr tablet TAKE 1 TABLET BY MOUTH EVERY MORNING  . cetirizine (ZYRTEC) 10 MG tablet TAKE 1 TABLET(10 MG) BY MOUTH DAILY  . Cholecalciferol (VITAMIN D3) 125 MCG (5000 UT) TABS Take 5,000 Units by mouth daily.  Marland Kitchen EPINEPHrine 0.3 mg/0.3 mL IJ SOAJ injection Inject 0.3 mLs into the muscle daily as needed for anaphylaxis.   . ferrous sulfate 325 (65 FE) MG tablet Take 325 mg by mouth. Once A Day on Mon, Wed, Fri  . hyoscyamine (LEVSIN SL) 0.125 MG SL tablet Place 1 tablet (0.125 mg total) under the tongue every 4 (four) hours as needed.  . Menthol, Topical Analgesic, (BIOFREEZE) 4 % GEL Apply topically. APPLY TO RIGHT SHOULDER Twice  A Day - PRN  . methocarbamol (ROBAXIN) 500 MG tablet Take 1 tablet (500 mg total) by mouth every 6 (six) hours as needed for muscle spasms.  . ondansetron (ZOFRAN) 4 MG tablet Take 1 tablet (4 mg total) by mouth every 8 (eight) hours as needed for nausea or vomiting.  Marland Kitchen oxycodone (OXY-IR) 5 MG capsule Take 1 capsule (5 mg total) by mouth at bedtime as needed.  . pantoprazole (PROTONIX) 40 MG tablet Take 40 mg by mouth daily.  Marland Kitchen PARoxetine (PAXIL) 10 MG tablet Take 10 mg by mouth daily.  . polyethylene glycol (MIRALAX / GLYCOLAX) 17 g packet Take 17 g by mouth daily as needed for moderate constipation or severe constipation.  . sennosides-docusate sodium (SENOKOT-S) 8.6-50 MG tablet Take 2 tablets by mouth daily.  Marland Kitchen triamcinolone cream (KENALOG) 0.1 % Apply 1 application topically 2 (two) times daily.   No facility-administered encounter medications on file as of 05/27/2020.    Review of Systems  Vitals:   05/27/20 1508  BP: 126/70  Pulse: 70  Resp: 18  Temp: 98.2 F (36.8 C)  SpO2: 96%  Weight: 190 lb (86.2 kg)  Height: 5\' 4"  (1.626 m)   Body mass index is 32.61 kg/m. Physical Exam  Constitutional: Oriented to  person, place, and time. Well-developed and well-nourished.  HENT:  Head: Normocephalic.  Mouth/Throat: Oropharynx is clear and moist.  Eyes: Pupils are equal, round, and reactive to light.  Neck: Neck supple.  Cardiovascular: Normal rate and normal heart sounds.  No murmur heard. Pulmonary/Chest: Effort normal and breath sounds normal. No respiratory distress. No wheezes. She has no rales.  Abdominal: Soft. Bowel sounds are normal. No distension. There is no tenderness. There is no rebound.  Musculoskeletal: MILD Edema Bilateral Right more then Left Lymphadenopathy: none Neurological: Alert and oriented to person, place, and time.  Skin: Skin is warm and dry.  Psychiatric: Normal mood and affect. Behavior is normal. Thought content normal.    Labs reviewed: Basic Metabolic Panel: Recent Labs    06/24/19 0338 06/24/19 0338 06/25/19 0431 06/25/19 0431 06/26/19 0241 06/26/19 0241 01/28/20 1400 01/28/20 1400 01/30/20 0422 01/30/20 0422 02/02/20 0343 02/02/20 0343 02/14/20 0000 03/11/20 0000 05/27/20 0000  NA 134*   < > 137   < > 136   < > 138   < > 135   < > 137  --  137 133* 138  K 4.1   < > 3.8   < > 3.6   < > 4.2   < > 4.9   < > 4.7   < > 4.3 4.2 4.2  CL 102   < > 104   < > 104   < > 107   < > 104   < > 104   < > 102 100 103  CO2 24   < > 24   < > 25   < > 22   < > 24   < > 26   < > 29* 27* 27*  GLUCOSE 116*   < > 111*   < > 105*   < > 131*  --  115*  --  120*  --   --   --   --   BUN 15   < > 19   < > 24*   < > 25*   < > 23   < > 18  --  10 18 13   CREATININE 0.81   < > 0.83   < >  0.85   < > 0.84   < > 0.83   < > 0.66  --  0.7 0.7 1.0  CALCIUM 8.7*   < > 8.9   < > 9.1   < > 9.6   < > 8.4*   < > 8.6*   < > 9.3 9.4 9.9  MG 2.0  --  1.9  --  2.1  --   --   --   --   --   --   --   --   --   --    < > = values in this interval not displayed.   Liver Function Tests: Recent Labs    06/19/19 1121 06/19/19 1121 02/14/20 0000 03/11/20 0000 05/27/20 0000  AST 8   < > 6*  5* 7*  ALT 6   < > 5* 3* 3*  ALKPHOS 91   < > 140* 110 108  BILITOT 0.6  --   --   --   --   PROT 6.4  --   --   --   --   ALBUMIN 4.4   < > 3.6 3.4* 3.9   < > = values in this interval not displayed.   No results for input(s): LIPASE, AMYLASE in the last 8760 hours. No results for input(s): AMMONIA in the last 8760 hours. CBC: Recent Labs    06/22/19 1633 06/23/19 0359 02/03/20 0901 02/03/20 0901 02/04/20 0418 02/04/20 0418 02/05/20 0324 02/05/20 0324 02/14/20 0000 03/11/20 0000 05/27/20 0000  WBC 14.7*   < > 10.0   < > 10.2   < > 9.7  --  9.9 12.0 6.7  NEUTROABS 12.4*  --   --   --   --   --   --   --  6,791 9,408  --   HGB 14.2   < > 7.6*   < > 7.0*   < > 9.2*   < > 10.5* 11.1* 13.2  HCT 43.4   < > 22.7*   < > 22.8*   < > 28.5*   < > 33* 33* 40  MCV 93.7   < > 91.2  --  96.6  --  94.4  --   --   --   --   PLT 276   < > 194   < > 192   < > 229   < > 524* 236 252   < > = values in this interval not displayed.   Cardiac Enzymes: No results for input(s): CKTOTAL, CKMB, CKMBINDEX, TROPONINI in the last 8760 hours. BNP: Invalid input(s): POCBNP CBG: Recent Labs    02/01/20 1030  GLUCAP 130*    Procedures and Imaging Studies During Stay: No results found.  Assessment/Plan:    History of total right hip arthroplasty Doing well Independent in her transfers and Walking with no assist just her walker Pain controlled on Oxycodone PRN and Tylenol Does not want Robaxin Follow up with Ortho Is going to have Home PT   S/P reverse total shoulder arthroplasty, right Pain Controlled Still little weak Will keep working with therapy Depression, recurrent (HCC) Continue Wellbutrin and Paxil Ativan discontinued Blood loss anemia Improved on iron Will need follow up with PCP Hgb Normal here  Osteopenia On Vit D and calcium Needs Follow up with her PCP  GERD On Prilosec   Patient is being discharged with the following home health services:  Therapy  Patient is  being discharged with the  following durable medical equipment:  Walker  Patient has been advised to f/u with their PCP in 1-2 weeks to for a transitions of care visit.  Social services at their facility was responsible for arranging this appointment.  Pt was provided with adequate prescriptions of noncontrolled medications to reach the scheduled appointment .  For controlled substances, a limited supply was provided as appropriate for the individual patient.  If the pt normally receives these medications from a pain clinic or has a contract with another physician, these medications should be received from that clinic or physician only).    Future labs/tests needed:   Discharge Planning of more then 30 min

## 2020-05-28 ENCOUNTER — Other Ambulatory Visit: Payer: Self-pay | Admitting: Internal Medicine

## 2020-05-29 DIAGNOSIS — S72001D Fracture of unspecified part of neck of right femur, subsequent encounter for closed fracture with routine healing: Secondary | ICD-10-CM | POA: Diagnosis not present

## 2020-05-29 DIAGNOSIS — S42301D Unspecified fracture of shaft of humerus, right arm, subsequent encounter for fracture with routine healing: Secondary | ICD-10-CM | POA: Diagnosis not present

## 2020-05-29 DIAGNOSIS — F329 Major depressive disorder, single episode, unspecified: Secondary | ICD-10-CM | POA: Diagnosis not present

## 2020-05-29 DIAGNOSIS — Z9181 History of falling: Secondary | ICD-10-CM | POA: Diagnosis not present

## 2020-05-29 DIAGNOSIS — M545 Low back pain: Secondary | ICD-10-CM | POA: Diagnosis not present

## 2020-05-29 DIAGNOSIS — D649 Anemia, unspecified: Secondary | ICD-10-CM | POA: Diagnosis not present

## 2020-05-29 DIAGNOSIS — M810 Age-related osteoporosis without current pathological fracture: Secondary | ICD-10-CM | POA: Diagnosis not present

## 2020-05-29 DIAGNOSIS — R1310 Dysphagia, unspecified: Secondary | ICD-10-CM | POA: Diagnosis not present

## 2020-05-29 DIAGNOSIS — K219 Gastro-esophageal reflux disease without esophagitis: Secondary | ICD-10-CM | POA: Diagnosis not present

## 2020-05-30 ENCOUNTER — Telehealth: Payer: Self-pay | Admitting: Family Medicine

## 2020-05-30 DIAGNOSIS — S72001D Fracture of unspecified part of neck of right femur, subsequent encounter for closed fracture with routine healing: Secondary | ICD-10-CM | POA: Diagnosis not present

## 2020-05-30 DIAGNOSIS — M545 Low back pain: Secondary | ICD-10-CM | POA: Diagnosis not present

## 2020-05-30 DIAGNOSIS — D649 Anemia, unspecified: Secondary | ICD-10-CM | POA: Diagnosis not present

## 2020-05-30 DIAGNOSIS — S42301D Unspecified fracture of shaft of humerus, right arm, subsequent encounter for fracture with routine healing: Secondary | ICD-10-CM | POA: Diagnosis not present

## 2020-05-30 DIAGNOSIS — R1310 Dysphagia, unspecified: Secondary | ICD-10-CM | POA: Diagnosis not present

## 2020-05-30 DIAGNOSIS — M810 Age-related osteoporosis without current pathological fracture: Secondary | ICD-10-CM | POA: Diagnosis not present

## 2020-05-30 NOTE — Telephone Encounter (Signed)
Will with [facility] is calling to get verbal orders for OT for 1 time a week for 3 weeks.  Can leave msg on secured voice mail [phone].

## 2020-06-03 DIAGNOSIS — M810 Age-related osteoporosis without current pathological fracture: Secondary | ICD-10-CM | POA: Diagnosis not present

## 2020-06-03 DIAGNOSIS — R1310 Dysphagia, unspecified: Secondary | ICD-10-CM | POA: Diagnosis not present

## 2020-06-03 DIAGNOSIS — M545 Low back pain: Secondary | ICD-10-CM | POA: Diagnosis not present

## 2020-06-03 DIAGNOSIS — S72001D Fracture of unspecified part of neck of right femur, subsequent encounter for closed fracture with routine healing: Secondary | ICD-10-CM | POA: Diagnosis not present

## 2020-06-03 DIAGNOSIS — D649 Anemia, unspecified: Secondary | ICD-10-CM | POA: Diagnosis not present

## 2020-06-03 DIAGNOSIS — S42301D Unspecified fracture of shaft of humerus, right arm, subsequent encounter for fracture with routine healing: Secondary | ICD-10-CM | POA: Diagnosis not present

## 2020-06-03 NOTE — Telephone Encounter (Signed)
Please okay the orders  

## 2020-06-03 NOTE — Telephone Encounter (Signed)
Verbal orders were left on the secure voice mail. Nothing further needed.

## 2020-06-04 DIAGNOSIS — M810 Age-related osteoporosis without current pathological fracture: Secondary | ICD-10-CM | POA: Diagnosis not present

## 2020-06-04 DIAGNOSIS — S72001D Fracture of unspecified part of neck of right femur, subsequent encounter for closed fracture with routine healing: Secondary | ICD-10-CM | POA: Diagnosis not present

## 2020-06-04 DIAGNOSIS — M545 Low back pain: Secondary | ICD-10-CM | POA: Diagnosis not present

## 2020-06-04 DIAGNOSIS — R1310 Dysphagia, unspecified: Secondary | ICD-10-CM | POA: Diagnosis not present

## 2020-06-04 DIAGNOSIS — D649 Anemia, unspecified: Secondary | ICD-10-CM | POA: Diagnosis not present

## 2020-06-04 DIAGNOSIS — S42301D Unspecified fracture of shaft of humerus, right arm, subsequent encounter for fracture with routine healing: Secondary | ICD-10-CM | POA: Diagnosis not present

## 2020-06-04 NOTE — Telephone Encounter (Signed)
This encounter was created in error - please disregard.

## 2020-06-06 DIAGNOSIS — S72001D Fracture of unspecified part of neck of right femur, subsequent encounter for closed fracture with routine healing: Secondary | ICD-10-CM | POA: Diagnosis not present

## 2020-06-06 DIAGNOSIS — M545 Low back pain: Secondary | ICD-10-CM | POA: Diagnosis not present

## 2020-06-06 DIAGNOSIS — R1310 Dysphagia, unspecified: Secondary | ICD-10-CM | POA: Diagnosis not present

## 2020-06-06 DIAGNOSIS — S42301D Unspecified fracture of shaft of humerus, right arm, subsequent encounter for fracture with routine healing: Secondary | ICD-10-CM | POA: Diagnosis not present

## 2020-06-06 DIAGNOSIS — D649 Anemia, unspecified: Secondary | ICD-10-CM | POA: Diagnosis not present

## 2020-06-06 DIAGNOSIS — M810 Age-related osteoporosis without current pathological fracture: Secondary | ICD-10-CM | POA: Diagnosis not present

## 2020-06-10 ENCOUNTER — Ambulatory Visit (INDEPENDENT_AMBULATORY_CARE_PROVIDER_SITE_OTHER): Payer: Medicare Other | Admitting: Family Medicine

## 2020-06-10 ENCOUNTER — Encounter: Payer: Self-pay | Admitting: Family Medicine

## 2020-06-10 ENCOUNTER — Other Ambulatory Visit: Payer: Self-pay

## 2020-06-10 VITALS — BP 120/66 | HR 82 | Temp 98.1°F | Ht 64.0 in | Wt 197.0 lb

## 2020-06-10 DIAGNOSIS — M25511 Pain in right shoulder: Secondary | ICD-10-CM

## 2020-06-10 DIAGNOSIS — M79604 Pain in right leg: Secondary | ICD-10-CM

## 2020-06-10 DIAGNOSIS — F339 Major depressive disorder, recurrent, unspecified: Secondary | ICD-10-CM

## 2020-06-10 DIAGNOSIS — F419 Anxiety disorder, unspecified: Secondary | ICD-10-CM

## 2020-06-10 DIAGNOSIS — M150 Primary generalized (osteo)arthritis: Secondary | ICD-10-CM

## 2020-06-10 DIAGNOSIS — M25551 Pain in right hip: Secondary | ICD-10-CM | POA: Diagnosis not present

## 2020-06-10 DIAGNOSIS — M8949 Other hypertrophic osteoarthropathy, multiple sites: Secondary | ICD-10-CM

## 2020-06-10 DIAGNOSIS — M159 Polyosteoarthritis, unspecified: Secondary | ICD-10-CM

## 2020-06-10 MED ORDER — PAROXETINE HCL 10 MG PO TABS: 10.0000 mg | ORAL_TABLET | Freq: Every day | ORAL | 3 refills | Status: DC

## 2020-06-10 MED ORDER — DENOSUMAB 60 MG/ML ~~LOC~~ SOSY
60.0000 mg | PREFILLED_SYRINGE | Freq: Once | SUBCUTANEOUS | Status: AC
  Administered 2020-06-10: 60 mg via SUBCUTANEOUS

## 2020-06-10 MED ORDER — CETIRIZINE HCL 10 MG PO TABS: ORAL_TABLET | ORAL | 3 refills | Status: DC

## 2020-06-10 MED ORDER — BUPROPION HCL ER (XL) 300 MG PO TB24: 300.0000 mg | ORAL_TABLET | Freq: Every morning | ORAL | 3 refills | Status: DC

## 2020-06-10 MED ORDER — METHOCARBAMOL 500 MG PO TABS: 500.0000 mg | ORAL_TABLET | Freq: Four times a day (QID) | ORAL | 2 refills | Status: DC | PRN

## 2020-06-10 MED ORDER — LORAZEPAM 0.5 MG PO TABS
0.5000 mg | ORAL_TABLET | Freq: Three times a day (TID) | ORAL | 2 refills | Status: DC | PRN
Start: 2020-06-10 — End: 2022-08-02

## 2020-06-10 NOTE — Progress Notes (Signed)
   Subjective:    Patient ID: Emily Horton, female    DOB: 1946/09/23, 74 y.o.   MRN: [id number]  HPI She is here to follow up on some chronic issues and with new problems. Her depression has been fairly stable on Wellbutrin and Paxil, but her anxiety flares up from time to time. She has used Lorazepam on an as needed basis in the past, and she asks for another supply to keep on hand. She has had a rough year, with a right shoulder replacement surgery on 01-29-20 per Dr. Mardelle Matte and then a right hip replacement surgery on 01-31-20 per Dr. Ninfa Linden. She then had a long rehab stay at [facility], and now she is back home. She was taken off aspirin for awhile after the surgeries, but she has been back on ASA for a month or so. The other issue is she noticed the sudden swelling of her right lower leg about one week ago. She feels pressure and mild pain in the right calf and behind the right knee. No recent trauma. The left leg never swells. She denies any chest pain or SOB.    Review of Systems  Constitutional: Negative.   Respiratory: Negative.   Cardiovascular: Positive for leg swelling. Negative for chest pain and palpitations.  Gastrointestinal: Negative.   Genitourinary: Negative.   Neurological: Negative.   Psychiatric/Behavioral: Negative for agitation, behavioral problems, confusion, decreased concentration, dysphoric mood and hallucinations. The patient is nervous/anxious.        Objective:   Physical Exam Constitutional:      General: She is not in acute distress.    Appearance: Normal appearance.  Cardiovascular:     Rate and Rhythm: Normal rate and regular rhythm.     Pulses: Normal pulses.     Heart sounds: Normal heart sounds.  Pulmonary:     Effort: Pulmonary effort is normal.     Breath sounds: Normal breath sounds.  Musculoskeletal:     Comments: There is 2+ edema in the right lower leg. The calf is mildly tender ans she is tender in the right popliteal space. No masses felt,  no cords. Bevelyn Buckles is negative.   Neurological:     General: No focal deficit present.     Mental Status: She is alert and oriented to person, place, and time.  Psychiatric:        Mood and Affect: Mood normal.        Behavior: Behavior normal.        Thought Content: Thought content normal.        Judgment: Judgment normal.           Assessment & Plan:  She has had one week of asymmetrical swelling in the right lower leg, this after prolonged periods of inactivity. I am concerned about the possibility of a DVT, so we will set up a venous doppler asap. Her depression and anxiety are fairly stable, but we will give her some Lorazepam to use prn. She is to receive a Prolia injection today as well.  Alysia Penna, MD

## 2020-06-10 NOTE — Patient Instructions (Signed)
Health Maintenance Due  Topic Date Due  . PNA vac Low Risk Adult (2 of 2 - PPSV23) 09/19/2017  . COVID-19 Vaccine (2 - Moderna 2-dose series) 03/28/2020    Depression screen [facility] 2/9 06/19/2019 06/12/2018  Decreased Interest 3 1  Down, Depressed, Hopeless 3 1  PHQ - 2 Score 6 2  Altered sleeping 2 -  Tired, decreased energy 3 -  Change in appetite 2 -  Feeling bad or failure about yourself  3 -  Trouble concentrating 0 -  Moving slowly or fidgety/restless 3 -  Suicidal thoughts 1 -  PHQ-9 Score 20 -  Difficult doing work/chores Somewhat difficult -

## 2020-06-12 ENCOUNTER — Other Ambulatory Visit: Payer: Self-pay

## 2020-06-12 ENCOUNTER — Ambulatory Visit (HOSPITAL_COMMUNITY)
Admission: RE | Admit: 2020-06-12 | Discharge: 2020-06-12 | Disposition: A | Discharge status: Home / Self Care | Payer: Medicare Other | Source: Ambulatory Visit | Attending: Family Medicine | Admitting: Family Medicine

## 2020-06-12 ENCOUNTER — Telehealth: Payer: Self-pay | Admitting: *Deleted

## 2020-06-12 DIAGNOSIS — S72001D Fracture of unspecified part of neck of right femur, subsequent encounter for closed fracture with routine healing: Secondary | ICD-10-CM | POA: Diagnosis not present

## 2020-06-12 DIAGNOSIS — M79604 Pain in right leg: Secondary | ICD-10-CM | POA: Diagnosis present

## 2020-06-12 DIAGNOSIS — M810 Age-related osteoporosis without current pathological fracture: Secondary | ICD-10-CM

## 2020-06-12 DIAGNOSIS — M545 Low back pain: Secondary | ICD-10-CM | POA: Diagnosis not present

## 2020-06-12 DIAGNOSIS — Z9181 History of falling: Secondary | ICD-10-CM

## 2020-06-12 DIAGNOSIS — F329 Major depressive disorder, single episode, unspecified: Secondary | ICD-10-CM

## 2020-06-12 DIAGNOSIS — R1310 Dysphagia, unspecified: Secondary | ICD-10-CM | POA: Diagnosis not present

## 2020-06-12 DIAGNOSIS — K219 Gastro-esophageal reflux disease without esophagitis: Secondary | ICD-10-CM

## 2020-06-12 DIAGNOSIS — D649 Anemia, unspecified: Secondary | ICD-10-CM

## 2020-06-12 DIAGNOSIS — S42301D Unspecified fracture of shaft of humerus, right arm, subsequent encounter for fracture with routine healing: Secondary | ICD-10-CM | POA: Diagnosis not present

## 2020-06-12 NOTE — Telephone Encounter (Signed)
Will was calling to report patient fall from Tuesday. Reports that patient verbalized when she moves the arm she fell on (right arm which she had surgery on) her pain is 8/10. If she does not move it her pain 0/10.

## 2020-06-13 DIAGNOSIS — S42201D Unspecified fracture of upper end of right humerus, subsequent encounter for fracture with routine healing: Secondary | ICD-10-CM | POA: Diagnosis not present

## 2020-06-16 DIAGNOSIS — M810 Age-related osteoporosis without current pathological fracture: Secondary | ICD-10-CM | POA: Diagnosis not present

## 2020-06-16 DIAGNOSIS — R1310 Dysphagia, unspecified: Secondary | ICD-10-CM | POA: Diagnosis not present

## 2020-06-16 DIAGNOSIS — S72001D Fracture of unspecified part of neck of right femur, subsequent encounter for closed fracture with routine healing: Secondary | ICD-10-CM | POA: Diagnosis not present

## 2020-06-16 DIAGNOSIS — D649 Anemia, unspecified: Secondary | ICD-10-CM | POA: Diagnosis not present

## 2020-06-16 DIAGNOSIS — M545 Low back pain: Secondary | ICD-10-CM | POA: Diagnosis not present

## 2020-06-16 DIAGNOSIS — S42301D Unspecified fracture of shaft of humerus, right arm, subsequent encounter for fracture with routine healing: Secondary | ICD-10-CM | POA: Diagnosis not present

## 2020-06-16 NOTE — Telephone Encounter (Signed)
She should see her orthopedist (Dr. Rush Farmer) about the shoulder

## 2020-06-16 NOTE — Telephone Encounter (Signed)
Clinic RN attempted to reach patient. No answer. LVM for patient to return call.

## 2020-06-17 ENCOUNTER — Telehealth: Payer: Self-pay | Admitting: Family Medicine

## 2020-06-17 DIAGNOSIS — M545 Low back pain: Secondary | ICD-10-CM | POA: Diagnosis not present

## 2020-06-17 DIAGNOSIS — D649 Anemia, unspecified: Secondary | ICD-10-CM | POA: Diagnosis not present

## 2020-06-17 DIAGNOSIS — R1310 Dysphagia, unspecified: Secondary | ICD-10-CM | POA: Diagnosis not present

## 2020-06-17 DIAGNOSIS — M810 Age-related osteoporosis without current pathological fracture: Secondary | ICD-10-CM | POA: Diagnosis not present

## 2020-06-17 DIAGNOSIS — S42301D Unspecified fracture of shaft of humerus, right arm, subsequent encounter for fracture with routine healing: Secondary | ICD-10-CM | POA: Diagnosis not present

## 2020-06-17 DIAGNOSIS — S72001D Fracture of unspecified part of neck of right femur, subsequent encounter for closed fracture with routine healing: Secondary | ICD-10-CM | POA: Diagnosis not present

## 2020-06-17 NOTE — Telephone Encounter (Signed)
I am glad to hear that!

## 2020-06-17 NOTE — Telephone Encounter (Signed)
Patient returned call. Patient reports she went to her orthopedist and they did an x-ray. Xray showed that everything was still connected and in place.

## 2020-06-17 NOTE — Telephone Encounter (Signed)
Will with [facility], is requesting a verbal order for occupational therapy for 1 time a week for 3 weeks. Thanks

## 2020-06-18 NOTE — Telephone Encounter (Signed)
Please okay the orders  

## 2020-06-18 NOTE — Telephone Encounter (Signed)
Spoke with Will. Orders have been given. Nothing further needed.

## 2020-06-24 ENCOUNTER — Telehealth: Payer: Self-pay | Admitting: Family Medicine

## 2020-06-24 DIAGNOSIS — D649 Anemia, unspecified: Secondary | ICD-10-CM | POA: Diagnosis not present

## 2020-06-24 DIAGNOSIS — S42301D Unspecified fracture of shaft of humerus, right arm, subsequent encounter for fracture with routine healing: Secondary | ICD-10-CM | POA: Diagnosis not present

## 2020-06-24 DIAGNOSIS — S72001D Fracture of unspecified part of neck of right femur, subsequent encounter for closed fracture with routine healing: Secondary | ICD-10-CM | POA: Diagnosis not present

## 2020-06-24 DIAGNOSIS — M810 Age-related osteoporosis without current pathological fracture: Secondary | ICD-10-CM | POA: Diagnosis not present

## 2020-06-24 DIAGNOSIS — M545 Low back pain: Secondary | ICD-10-CM | POA: Diagnosis not present

## 2020-06-24 DIAGNOSIS — R1310 Dysphagia, unspecified: Secondary | ICD-10-CM | POA: Diagnosis not present

## 2020-06-24 MED ORDER — FERROUS SULFATE 325 (65 FE) MG PO TABS: ORAL_TABLET | ORAL | 3 refills | Status: DC

## 2020-06-24 NOTE — Telephone Encounter (Signed)
rx filled by a historical provider. [place] for refills?

## 2020-06-24 NOTE — Telephone Encounter (Signed)
Done

## 2020-06-24 NOTE — Telephone Encounter (Signed)
Patient needs a refill on Ferrous Sulfate.  Pharmacy- [facility] on [address] and [address]

## 2020-06-28 DIAGNOSIS — S42301D Unspecified fracture of shaft of humerus, right arm, subsequent encounter for fracture with routine healing: Secondary | ICD-10-CM | POA: Diagnosis not present

## 2020-06-28 DIAGNOSIS — F329 Major depressive disorder, single episode, unspecified: Secondary | ICD-10-CM | POA: Diagnosis not present

## 2020-06-28 DIAGNOSIS — R1310 Dysphagia, unspecified: Secondary | ICD-10-CM | POA: Diagnosis not present

## 2020-06-28 DIAGNOSIS — D649 Anemia, unspecified: Secondary | ICD-10-CM | POA: Diagnosis not present

## 2020-06-28 DIAGNOSIS — K219 Gastro-esophageal reflux disease without esophagitis: Secondary | ICD-10-CM | POA: Diagnosis not present

## 2020-06-28 DIAGNOSIS — Z9181 History of falling: Secondary | ICD-10-CM | POA: Diagnosis not present

## 2020-06-28 DIAGNOSIS — M810 Age-related osteoporosis without current pathological fracture: Secondary | ICD-10-CM | POA: Diagnosis not present

## 2020-06-28 DIAGNOSIS — S72001D Fracture of unspecified part of neck of right femur, subsequent encounter for closed fracture with routine healing: Secondary | ICD-10-CM | POA: Diagnosis not present

## 2020-06-28 DIAGNOSIS — M545 Low back pain: Secondary | ICD-10-CM | POA: Diagnosis not present

## 2020-06-30 DIAGNOSIS — S72001D Fracture of unspecified part of neck of right femur, subsequent encounter for closed fracture with routine healing: Secondary | ICD-10-CM | POA: Diagnosis not present

## 2020-06-30 DIAGNOSIS — R1310 Dysphagia, unspecified: Secondary | ICD-10-CM | POA: Diagnosis not present

## 2020-06-30 DIAGNOSIS — D649 Anemia, unspecified: Secondary | ICD-10-CM | POA: Diagnosis not present

## 2020-06-30 DIAGNOSIS — M810 Age-related osteoporosis without current pathological fracture: Secondary | ICD-10-CM | POA: Diagnosis not present

## 2020-06-30 DIAGNOSIS — S42301D Unspecified fracture of shaft of humerus, right arm, subsequent encounter for fracture with routine healing: Secondary | ICD-10-CM | POA: Diagnosis not present

## 2020-06-30 DIAGNOSIS — M545 Low back pain: Secondary | ICD-10-CM | POA: Diagnosis not present

## 2020-07-03 DIAGNOSIS — M545 Low back pain: Secondary | ICD-10-CM | POA: Diagnosis not present

## 2020-07-03 DIAGNOSIS — S72001D Fracture of unspecified part of neck of right femur, subsequent encounter for closed fracture with routine healing: Secondary | ICD-10-CM | POA: Diagnosis not present

## 2020-07-03 DIAGNOSIS — D649 Anemia, unspecified: Secondary | ICD-10-CM | POA: Diagnosis not present

## 2020-07-03 DIAGNOSIS — S42301D Unspecified fracture of shaft of humerus, right arm, subsequent encounter for fracture with routine healing: Secondary | ICD-10-CM | POA: Diagnosis not present

## 2020-07-03 DIAGNOSIS — R1310 Dysphagia, unspecified: Secondary | ICD-10-CM | POA: Diagnosis not present

## 2020-07-03 DIAGNOSIS — M810 Age-related osteoporosis without current pathological fracture: Secondary | ICD-10-CM | POA: Diagnosis not present

## 2020-07-08 ENCOUNTER — Telehealth: Payer: Self-pay | Admitting: Family Medicine

## 2020-07-08 DIAGNOSIS — R1310 Dysphagia, unspecified: Secondary | ICD-10-CM | POA: Diagnosis not present

## 2020-07-08 DIAGNOSIS — D649 Anemia, unspecified: Secondary | ICD-10-CM | POA: Diagnosis not present

## 2020-07-08 DIAGNOSIS — S72001D Fracture of unspecified part of neck of right femur, subsequent encounter for closed fracture with routine healing: Secondary | ICD-10-CM | POA: Diagnosis not present

## 2020-07-08 DIAGNOSIS — M810 Age-related osteoporosis without current pathological fracture: Secondary | ICD-10-CM | POA: Diagnosis not present

## 2020-07-08 DIAGNOSIS — S42301D Unspecified fracture of shaft of humerus, right arm, subsequent encounter for fracture with routine healing: Secondary | ICD-10-CM | POA: Diagnosis not present

## 2020-07-08 DIAGNOSIS — M545 Low back pain: Secondary | ICD-10-CM | POA: Diagnosis not present

## 2020-07-08 NOTE — Telephone Encounter (Signed)
Will [facility] [phone]  He needs verbal orders to extend the patients OT  1 time a week for 3 weeks

## 2020-07-09 NOTE — Telephone Encounter (Signed)
Please okay these orders  ?

## 2020-07-09 NOTE — Telephone Encounter (Signed)
Orders have been given. Nothing further needed.  °

## 2020-07-10 DIAGNOSIS — M810 Age-related osteoporosis without current pathological fracture: Secondary | ICD-10-CM | POA: Diagnosis not present

## 2020-07-10 DIAGNOSIS — S72001D Fracture of unspecified part of neck of right femur, subsequent encounter for closed fracture with routine healing: Secondary | ICD-10-CM | POA: Diagnosis not present

## 2020-07-10 DIAGNOSIS — D649 Anemia, unspecified: Secondary | ICD-10-CM | POA: Diagnosis not present

## 2020-07-10 DIAGNOSIS — R1310 Dysphagia, unspecified: Secondary | ICD-10-CM | POA: Diagnosis not present

## 2020-07-10 DIAGNOSIS — S42301D Unspecified fracture of shaft of humerus, right arm, subsequent encounter for fracture with routine healing: Secondary | ICD-10-CM | POA: Diagnosis not present

## 2020-07-10 DIAGNOSIS — M545 Low back pain: Secondary | ICD-10-CM | POA: Diagnosis not present

## 2020-07-16 ENCOUNTER — Telehealth: Payer: Self-pay | Admitting: Family Medicine

## 2020-07-16 DIAGNOSIS — M810 Age-related osteoporosis without current pathological fracture: Secondary | ICD-10-CM | POA: Diagnosis not present

## 2020-07-16 DIAGNOSIS — D649 Anemia, unspecified: Secondary | ICD-10-CM | POA: Diagnosis not present

## 2020-07-16 DIAGNOSIS — M545 Low back pain: Secondary | ICD-10-CM | POA: Diagnosis not present

## 2020-07-16 DIAGNOSIS — R1310 Dysphagia, unspecified: Secondary | ICD-10-CM | POA: Diagnosis not present

## 2020-07-16 DIAGNOSIS — S72001D Fracture of unspecified part of neck of right femur, subsequent encounter for closed fracture with routine healing: Secondary | ICD-10-CM | POA: Diagnosis not present

## 2020-07-16 DIAGNOSIS — S42301D Unspecified fracture of shaft of humerus, right arm, subsequent encounter for fracture with routine healing: Secondary | ICD-10-CM | POA: Diagnosis not present

## 2020-07-16 NOTE — Telephone Encounter (Signed)
Pt was going to  be discharge today.  Continues to be unsteady will extend her services.  1 x 5 weeks  Donata Duff Physical Therapist [phone]  Please advise

## 2020-07-16 NOTE — Telephone Encounter (Signed)
Please okay these orders  ?

## 2020-07-17 DIAGNOSIS — M810 Age-related osteoporosis without current pathological fracture: Secondary | ICD-10-CM | POA: Diagnosis not present

## 2020-07-17 DIAGNOSIS — R1310 Dysphagia, unspecified: Secondary | ICD-10-CM | POA: Diagnosis not present

## 2020-07-17 DIAGNOSIS — D649 Anemia, unspecified: Secondary | ICD-10-CM | POA: Diagnosis not present

## 2020-07-17 DIAGNOSIS — S72001D Fracture of unspecified part of neck of right femur, subsequent encounter for closed fracture with routine healing: Secondary | ICD-10-CM | POA: Diagnosis not present

## 2020-07-17 DIAGNOSIS — M545 Low back pain: Secondary | ICD-10-CM | POA: Diagnosis not present

## 2020-07-17 DIAGNOSIS — S42301D Unspecified fracture of shaft of humerus, right arm, subsequent encounter for fracture with routine healing: Secondary | ICD-10-CM | POA: Diagnosis not present

## 2020-07-17 NOTE — Telephone Encounter (Signed)
Left a detailed message for Emily Horton giving the [facility] for orders.

## 2020-07-23 DIAGNOSIS — S72001D Fracture of unspecified part of neck of right femur, subsequent encounter for closed fracture with routine healing: Secondary | ICD-10-CM | POA: Diagnosis not present

## 2020-07-23 DIAGNOSIS — M545 Low back pain: Secondary | ICD-10-CM | POA: Diagnosis not present

## 2020-07-23 DIAGNOSIS — R1310 Dysphagia, unspecified: Secondary | ICD-10-CM | POA: Diagnosis not present

## 2020-07-23 DIAGNOSIS — S42301D Unspecified fracture of shaft of humerus, right arm, subsequent encounter for fracture with routine healing: Secondary | ICD-10-CM | POA: Diagnosis not present

## 2020-07-23 DIAGNOSIS — M810 Age-related osteoporosis without current pathological fracture: Secondary | ICD-10-CM | POA: Diagnosis not present

## 2020-07-23 DIAGNOSIS — D649 Anemia, unspecified: Secondary | ICD-10-CM | POA: Diagnosis not present

## 2020-07-25 DIAGNOSIS — S42301D Unspecified fracture of shaft of humerus, right arm, subsequent encounter for fracture with routine healing: Secondary | ICD-10-CM | POA: Diagnosis not present

## 2020-07-25 DIAGNOSIS — D649 Anemia, unspecified: Secondary | ICD-10-CM | POA: Diagnosis not present

## 2020-07-25 DIAGNOSIS — M545 Low back pain: Secondary | ICD-10-CM | POA: Diagnosis not present

## 2020-07-25 DIAGNOSIS — R1310 Dysphagia, unspecified: Secondary | ICD-10-CM | POA: Diagnosis not present

## 2020-07-25 DIAGNOSIS — S72001D Fracture of unspecified part of neck of right femur, subsequent encounter for closed fracture with routine healing: Secondary | ICD-10-CM | POA: Diagnosis not present

## 2020-07-25 DIAGNOSIS — M810 Age-related osteoporosis without current pathological fracture: Secondary | ICD-10-CM | POA: Diagnosis not present

## 2020-07-28 DIAGNOSIS — R1310 Dysphagia, unspecified: Secondary | ICD-10-CM | POA: Diagnosis not present

## 2020-07-28 DIAGNOSIS — D649 Anemia, unspecified: Secondary | ICD-10-CM | POA: Diagnosis not present

## 2020-07-28 DIAGNOSIS — F329 Major depressive disorder, single episode, unspecified: Secondary | ICD-10-CM | POA: Diagnosis not present

## 2020-07-28 DIAGNOSIS — K219 Gastro-esophageal reflux disease without esophagitis: Secondary | ICD-10-CM | POA: Diagnosis not present

## 2020-07-28 DIAGNOSIS — S42301D Unspecified fracture of shaft of humerus, right arm, subsequent encounter for fracture with routine healing: Secondary | ICD-10-CM | POA: Diagnosis not present

## 2020-07-28 DIAGNOSIS — S72001D Fracture of unspecified part of neck of right femur, subsequent encounter for closed fracture with routine healing: Secondary | ICD-10-CM | POA: Diagnosis not present

## 2020-07-28 DIAGNOSIS — M810 Age-related osteoporosis without current pathological fracture: Secondary | ICD-10-CM | POA: Diagnosis not present

## 2020-07-28 DIAGNOSIS — Z9181 History of falling: Secondary | ICD-10-CM | POA: Diagnosis not present

## 2020-07-28 DIAGNOSIS — M545 Low back pain: Secondary | ICD-10-CM | POA: Diagnosis not present

## 2020-07-31 ENCOUNTER — Encounter: Payer: Self-pay | Admitting: Orthopaedic Surgery

## 2020-07-31 ENCOUNTER — Ambulatory Visit (INDEPENDENT_AMBULATORY_CARE_PROVIDER_SITE_OTHER): Payer: Medicare Other | Admitting: Orthopaedic Surgery

## 2020-07-31 ENCOUNTER — Ambulatory Visit (INDEPENDENT_AMBULATORY_CARE_PROVIDER_SITE_OTHER): Payer: Medicare Other

## 2020-07-31 DIAGNOSIS — Z96641 Presence of right artificial hip joint: Secondary | ICD-10-CM

## 2020-07-31 NOTE — Progress Notes (Signed)
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  This was done the same week she had a total shoulder arthroplasty.  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  She still ambulates with a walker and says she lacks full confidence to go without assistive device but overall feels like she is made a lot of progress.  She reports a little bit of burning in her incision but otherwise no significant issues.  She has had no acute change in medical status.  She does walk slowly with her walker.  Her leg lengths are near equal.  She tolerates me easily putting her right operative hip through internal and external rotation and the hip is stable on exam.  An AP pelvis lateral of the right hip shows a well-seated total hip arthroplasty with evidence of bone ingrowth and no complicating features.  This standpoint follow-up can be as needed since she is doing well.  We had a long thorough discussion about her hip.  All questions and concerns were answered and addressed.  If there is any issues at all she knows to not hesitate to give [place] a call

## 2020-08-01 DIAGNOSIS — S72001D Fracture of unspecified part of neck of right femur, subsequent encounter for closed fracture with routine healing: Secondary | ICD-10-CM | POA: Diagnosis not present

## 2020-08-01 DIAGNOSIS — D649 Anemia, unspecified: Secondary | ICD-10-CM | POA: Diagnosis not present

## 2020-08-01 DIAGNOSIS — M810 Age-related osteoporosis without current pathological fracture: Secondary | ICD-10-CM | POA: Diagnosis not present

## 2020-08-01 DIAGNOSIS — R1310 Dysphagia, unspecified: Secondary | ICD-10-CM | POA: Diagnosis not present

## 2020-08-01 DIAGNOSIS — M545 Low back pain: Secondary | ICD-10-CM | POA: Diagnosis not present

## 2020-08-01 DIAGNOSIS — S42301D Unspecified fracture of shaft of humerus, right arm, subsequent encounter for fracture with routine healing: Secondary | ICD-10-CM | POA: Diagnosis not present

## 2020-08-06 DIAGNOSIS — M545 Low back pain: Secondary | ICD-10-CM | POA: Diagnosis not present

## 2020-08-06 DIAGNOSIS — M810 Age-related osteoporosis without current pathological fracture: Secondary | ICD-10-CM | POA: Diagnosis not present

## 2020-08-06 DIAGNOSIS — R1310 Dysphagia, unspecified: Secondary | ICD-10-CM | POA: Diagnosis not present

## 2020-08-06 DIAGNOSIS — S72001D Fracture of unspecified part of neck of right femur, subsequent encounter for closed fracture with routine healing: Secondary | ICD-10-CM | POA: Diagnosis not present

## 2020-08-06 DIAGNOSIS — D649 Anemia, unspecified: Secondary | ICD-10-CM | POA: Diagnosis not present

## 2020-08-06 DIAGNOSIS — S42301D Unspecified fracture of shaft of humerus, right arm, subsequent encounter for fracture with routine healing: Secondary | ICD-10-CM | POA: Diagnosis not present

## 2020-08-12 ENCOUNTER — Other Ambulatory Visit: Payer: Self-pay

## 2020-08-12 ENCOUNTER — Telehealth (INDEPENDENT_AMBULATORY_CARE_PROVIDER_SITE_OTHER): Payer: Medicare Other | Admitting: Family Medicine

## 2020-08-12 DIAGNOSIS — H00012 Hordeolum externum right lower eyelid: Secondary | ICD-10-CM | POA: Diagnosis not present

## 2020-08-12 DIAGNOSIS — H5789 Other specified disorders of eye and adnexa: Secondary | ICD-10-CM

## 2020-08-12 MED ORDER — ERYTHROMYCIN 5 MG/GM OP OINT
1.0000 | TOPICAL_OINTMENT | Freq: Four times a day (QID) | OPHTHALMIC | 0 refills | Status: DC
Start: 2020-08-12 — End: 2021-01-29

## 2020-08-12 NOTE — Patient Instructions (Signed)
-  I sent the medication(s) we discussed to your pharmacy: Meds ordered this encounter  Medications  . erythromycin ophthalmic ointment    Sig: Place 1 application into the right eye 4 (four) times daily. For 1 week.    Dispense:  3.5 g    Refill:  0    I hope you are feeling better soon! Seek care promptly if your symptoms worsen, new concerns arise or you are not improving with treatment.

## 2020-08-12 NOTE — Progress Notes (Signed)
Virtual Visit via Video Note  I connected with Emily Horton  on 08/12/20 at  4:00 PM EDT by a video enabled telemedicine application and verified that I am speaking with the correct person using two identifiers.  Location patient: home Location provider:work or home office Persons participating in the virtual visit: patient, provider  I discussed the limitations of evaluation and management by telemedicine and the availability of in person appointments. The patient expressed understanding and agreed to proceed.   HPI:  Acute visit for "Pink Eye": -R eye -started 3-4 days ago -symptoms include itchiness, feels irritated, some muck in the eye, bump on bottom eyelid -denies fevers, malaise, pain in the eye, vision loss, swelling or redness of the face, HA  ROS: See pertinent positives and negatives per HPI.  Past Medical History:  Diagnosis Date  . Colon polyps 09-25-2007   Colonoscopy  . Depression   . Diverticulosis of colon (without mention of hemorrhage) 09-25-2007   Colonoscopy  . GERD (gastroesophageal reflux disease)   . Gynecological examination    sees Dr. Carren Rang   . Hyperlipidemia     Past Surgical History:  Procedure Laterality Date  . COLONOSCOPY  12/30/2015   per Dr. Henrene Pastor, benign  polyps, repeat in 5  yrs.   . ESOPHAGOGASTRODUODENOSCOPY  06-30-12   per Dr. Sharlett Iles, reflux but no Barretts seen   . HARDWARE REMOVAL Right 02/01/2020   Procedure: HARDWARE REMOVAL;  Surgeon: Mcarthur Rossetti, MD;  Location: [facility];  Service: Orthopedics;  Laterality: Right;  . INTRAMEDULLARY (IM) NAIL INTERTROCHANTERIC Right 06/22/2019  . INTRAMEDULLARY (IM) NAIL INTERTROCHANTERIC Right 06/22/2019   Procedure: INTRAMEDULLARY (IM) NAIL INTERTROCHANTRIC;  Surgeon: Altamese Livingston, MD;  Location: [facility];  Service: Orthopedics;  Laterality: Right;  . REVERSE SHOULDER ARTHROPLASTY Right 01/29/2020   Procedure: RIGHT REVERSE SHOULDER ARTHROPLASTY;  Surgeon: Marchia Bond, MD;  Location: [facility];  Service: Orthopedics;  Laterality: Right;  . TOTAL HIP ARTHROPLASTY Right 02/01/2020   Procedure: TOTAL HIP ARTHROPLASTY ANTERIOR APPROACH;  Surgeon: Mcarthur Rossetti, MD;  Location: [facility];  Service: Orthopedics;  Laterality: Right;    Family History  Problem Relation Age of Onset  . Breast cancer Maternal Grandmother   . Crohn's disease Father   . Colon polyps Father   . Stroke Paternal Grandfather     SOCIAL HX: see hpi   Current Outpatient Medications:  .  ASPIRIN 81 PO, Take 81 mg by mouth daily., Disp: , Rfl:  .  buPROPion (WELLBUTRIN XL) 300 MG 24 hr tablet, Take 1 tablet (300 mg total) by mouth every morning., Disp: 90 tablet, Rfl: 3 .  cetirizine (ZYRTEC) 10 MG tablet, TAKE 1 TABLET(10 MG) BY MOUTH DAILY, Disp: 90 tablet, Rfl: 3 .  Cholecalciferol (VITAMIN D3) 125 MCG (5000 UT) TABS, Take 5,000 Units by mouth daily., Disp: , Rfl:  .  EPINEPHrine 0.3 mg/0.3 mL IJ SOAJ injection, Inject 0.3 mLs (0.3 mg total) into the muscle daily as needed for anaphylaxis., Disp: 1 each, Rfl: 0 .  erythromycin ophthalmic ointment, Place 1 application into the right eye 4 (four) times daily. For 1 week., Disp: 3.5 g, Rfl: 0 .  ferrous sulfate 325 (65 FE) MG tablet, Once A Day on Mon, Wed, Fri, Disp: 36 tablet, Rfl: 3 .  hyoscyamine (LEVSIN SL) 0.125 MG SL tablet, Place 1 tablet (0.125 mg total) under the tongue every 4 (four) hours as needed., Disp: 60 tablet, Rfl: 11 .  LORazepam (ATIVAN) 0.5 MG tablet, Take 1 tablet (0.5  mg total) by mouth every 8 (eight) hours as needed for anxiety., Disp: 90 tablet, Rfl: 2 .  Menthol, Topical Analgesic, (BIOFREEZE) 4 % GEL, Apply topically. APPLY TO RIGHT SHOULDER Twice A Day - PRN, Disp: , Rfl:  .  methocarbamol (ROBAXIN) 500 MG tablet, Take 1 tablet (500 mg total) by mouth every 6 (six) hours as needed for muscle spasms., Disp: 60 tablet, Rfl: 2 .  ondansetron (ZOFRAN) 4 MG tablet, Take 1 tablet (4 mg total) by mouth every 8 (eight) hours as  needed for nausea or vomiting., Disp: 10 tablet, Rfl: 0 .  oxycodone (OXY-IR) 5 MG capsule, Take 1 capsule (5 mg total) by mouth at bedtime as needed., Disp: 30 capsule, Rfl: 0 .  pantoprazole (PROTONIX) 40 MG tablet, Take 1 tablet (40 mg total) by mouth daily., Disp: 30 tablet, Rfl: 0 .  PARoxetine (PAXIL) 10 MG tablet, Take 1 tablet (10 mg total) by mouth daily., Disp: 90 tablet, Rfl: 3 .  polyethylene glycol (MIRALAX / GLYCOLAX) 17 g packet, Take 17 g by mouth daily as needed for moderate constipation or severe constipation., Disp: 14 each, Rfl: 0 .  sennosides-docusate sodium (SENOKOT-S) 8.6-50 MG tablet, Take 2 tablets by mouth daily., Disp: , Rfl:  .  triamcinolone cream (KENALOG) 0.1 %, Apply 1 application topically 2 (two) times daily., Disp: 453.6 g, Rfl: 3  EXAM:  VITALS per patient if applicable:denies fever  GENERAL: alert, oriented, appears well and in no acute distress  HEENT: atraumatic, conjunttiva erythematous in R eye, small swelling of R lower eye lid, no purulence or drainage appreciated at the time of this video visit, EOMI, PER, she reports vision in this eye is unchanged from baseline, no obvious abnormalities on inspection of external nose and ears  NECK: normal movements of the head and neck  LUNGS: on inspection no signs of respiratory distress, breathing rate appears normal, no obvious gross SOB, gasping or wheezing  CV: no obvious cyanosis  MS: moves all visible extremities without noticeable abnormality  PSYCH/NEURO: pleasant and cooperative, no obvious depression or anxiety, speech and thought processing grossly intact  ASSESSMENT AND PLAN:  Discussed the following assessment and plan:  Eye irritation  Hordeolum externum of right lower eyelid  -we discussed possible serious and likely etiologies, options for evaluation and workup, limitations of telemedicine visit vs in person visit, treatment, treatment risks and precautions. Pt prefers to treat via  telemedicine empirically rather then risking or undertaking an in person visit at this moment. She opted for compresses and erythro optho oint Rx sent given drainage reported. Advised to seek prompt follow up telemedicine visit or in person care if worsening, new symptoms arise, or if is not improving with treatment. I discussed the assessment and treatment plan with the patient. The patient was provided an opportunity to ask questions and all were answered. The patient agreed with the plan and demonstrated an understanding of the instructions.      Lucretia Kern, DO

## 2020-08-15 DIAGNOSIS — S42301D Unspecified fracture of shaft of humerus, right arm, subsequent encounter for fracture with routine healing: Secondary | ICD-10-CM | POA: Diagnosis not present

## 2020-08-15 DIAGNOSIS — D649 Anemia, unspecified: Secondary | ICD-10-CM | POA: Diagnosis not present

## 2020-08-15 DIAGNOSIS — M810 Age-related osteoporosis without current pathological fracture: Secondary | ICD-10-CM | POA: Diagnosis not present

## 2020-08-15 DIAGNOSIS — S72001D Fracture of unspecified part of neck of right femur, subsequent encounter for closed fracture with routine healing: Secondary | ICD-10-CM | POA: Diagnosis not present

## 2020-08-15 DIAGNOSIS — R1310 Dysphagia, unspecified: Secondary | ICD-10-CM | POA: Diagnosis not present

## 2020-08-15 DIAGNOSIS — M545 Low back pain: Secondary | ICD-10-CM | POA: Diagnosis not present

## 2020-08-20 DIAGNOSIS — S42301D Unspecified fracture of shaft of humerus, right arm, subsequent encounter for fracture with routine healing: Secondary | ICD-10-CM | POA: Diagnosis not present

## 2020-08-20 DIAGNOSIS — D649 Anemia, unspecified: Secondary | ICD-10-CM | POA: Diagnosis not present

## 2020-08-20 DIAGNOSIS — M545 Low back pain: Secondary | ICD-10-CM | POA: Diagnosis not present

## 2020-08-20 DIAGNOSIS — R1310 Dysphagia, unspecified: Secondary | ICD-10-CM | POA: Diagnosis not present

## 2020-08-20 DIAGNOSIS — S72001D Fracture of unspecified part of neck of right femur, subsequent encounter for closed fracture with routine healing: Secondary | ICD-10-CM | POA: Diagnosis not present

## 2020-08-20 DIAGNOSIS — M810 Age-related osteoporosis without current pathological fracture: Secondary | ICD-10-CM | POA: Diagnosis not present

## 2020-08-26 DIAGNOSIS — S72001D Fracture of unspecified part of neck of right femur, subsequent encounter for closed fracture with routine healing: Secondary | ICD-10-CM | POA: Diagnosis not present

## 2020-08-26 DIAGNOSIS — R1310 Dysphagia, unspecified: Secondary | ICD-10-CM | POA: Diagnosis not present

## 2020-08-26 DIAGNOSIS — S42301D Unspecified fracture of shaft of humerus, right arm, subsequent encounter for fracture with routine healing: Secondary | ICD-10-CM | POA: Diagnosis not present

## 2020-08-26 DIAGNOSIS — M545 Low back pain: Secondary | ICD-10-CM | POA: Diagnosis not present

## 2020-08-26 DIAGNOSIS — M810 Age-related osteoporosis without current pathological fracture: Secondary | ICD-10-CM | POA: Diagnosis not present

## 2020-08-26 DIAGNOSIS — D649 Anemia, unspecified: Secondary | ICD-10-CM | POA: Diagnosis not present

## 2020-08-27 DIAGNOSIS — M545 Low back pain: Secondary | ICD-10-CM | POA: Diagnosis not present

## 2020-08-27 DIAGNOSIS — K219 Gastro-esophageal reflux disease without esophagitis: Secondary | ICD-10-CM | POA: Diagnosis not present

## 2020-08-27 DIAGNOSIS — S72001D Fracture of unspecified part of neck of right femur, subsequent encounter for closed fracture with routine healing: Secondary | ICD-10-CM | POA: Diagnosis not present

## 2020-08-27 DIAGNOSIS — D649 Anemia, unspecified: Secondary | ICD-10-CM | POA: Diagnosis not present

## 2020-08-27 DIAGNOSIS — M810 Age-related osteoporosis without current pathological fracture: Secondary | ICD-10-CM | POA: Diagnosis not present

## 2020-08-27 DIAGNOSIS — R1310 Dysphagia, unspecified: Secondary | ICD-10-CM | POA: Diagnosis not present

## 2020-08-27 DIAGNOSIS — S42301D Unspecified fracture of shaft of humerus, right arm, subsequent encounter for fracture with routine healing: Secondary | ICD-10-CM | POA: Diagnosis not present

## 2020-08-27 DIAGNOSIS — F329 Major depressive disorder, single episode, unspecified: Secondary | ICD-10-CM | POA: Diagnosis not present

## 2020-08-27 DIAGNOSIS — Z9181 History of falling: Secondary | ICD-10-CM | POA: Diagnosis not present

## 2020-09-01 DIAGNOSIS — M810 Age-related osteoporosis without current pathological fracture: Secondary | ICD-10-CM | POA: Diagnosis not present

## 2020-09-01 DIAGNOSIS — S42301D Unspecified fracture of shaft of humerus, right arm, subsequent encounter for fracture with routine healing: Secondary | ICD-10-CM | POA: Diagnosis not present

## 2020-09-01 DIAGNOSIS — S72001D Fracture of unspecified part of neck of right femur, subsequent encounter for closed fracture with routine healing: Secondary | ICD-10-CM | POA: Diagnosis not present

## 2020-09-01 DIAGNOSIS — M545 Low back pain: Secondary | ICD-10-CM | POA: Diagnosis not present

## 2020-09-01 DIAGNOSIS — D649 Anemia, unspecified: Secondary | ICD-10-CM | POA: Diagnosis not present

## 2020-09-01 DIAGNOSIS — R1310 Dysphagia, unspecified: Secondary | ICD-10-CM | POA: Diagnosis not present

## 2020-09-16 ENCOUNTER — Telehealth: Payer: Self-pay | Admitting: Family Medicine

## 2020-09-16 NOTE — Telephone Encounter (Signed)
Right leg is swelling more.  She is concerned and wants a doppler done.  Has a hard time moving it when she is up and about for awhile.  She had hip surgery back in July.

## 2020-09-18 NOTE — Telephone Encounter (Signed)
She will need an in person OV

## 2020-09-24 NOTE — Telephone Encounter (Signed)
Patient is scheduled to come in office on 09/29/2020 at 1 PM

## 2020-09-29 ENCOUNTER — Ambulatory Visit (INDEPENDENT_AMBULATORY_CARE_PROVIDER_SITE_OTHER): Payer: Medicare Other | Admitting: Family Medicine

## 2020-09-29 ENCOUNTER — Encounter: Payer: Self-pay | Admitting: Family Medicine

## 2020-09-29 ENCOUNTER — Other Ambulatory Visit: Payer: Self-pay

## 2020-09-29 VITALS — BP 110/66 | HR 78 | Temp 97.7°F | Ht 64.0 in | Wt 200.4 lb

## 2020-09-29 DIAGNOSIS — L989 Disorder of the skin and subcutaneous tissue, unspecified: Secondary | ICD-10-CM | POA: Diagnosis not present

## 2020-09-29 DIAGNOSIS — F339 Major depressive disorder, recurrent, unspecified: Secondary | ICD-10-CM

## 2020-09-29 DIAGNOSIS — M7989 Other specified soft tissue disorders: Secondary | ICD-10-CM | POA: Diagnosis not present

## 2020-09-29 MED ORDER — PAROXETINE HCL 10 MG PO TABS: 10.0000 mg | ORAL_TABLET | Freq: Every day | ORAL | 3 refills | Status: DC

## 2020-09-29 NOTE — Progress Notes (Signed)
   Subjective:    Patient ID: Emily Horton, female    DOB: [birth year], 74 y.o.   MRN: [id number]  HPI Here with her daughter for several issues. First she has continued to have unilateral swelling in the right leg since this past summer. In July she had a doppler which was negative for DVT. Now she has also developed some pain in the right lateral thigh area in the past few weeks. No recent trauma. She is S/P a total arthroplasty of the right hip earlier this year. Also she says she has had a lump on the left upper arm for several years, but about 2 weeks ago it opened and drained. Since then is has become painful and it drains clear fluid. She has already set up a visit to see Dr. Renda Rolls, her dermatologist, for tomorrow. Finally her nerves have been acting up the past month or so, and she feels sad and anxious all the time. She cries frequently and worries about things. She has been taking Wellbutrin for years. She was tried briefly on Paxil this summer but this was stopped for some reason when she went to the rehab facility after her hip surgery. She uses Lorazepam occasionally.    Review of Systems  Constitutional: Negative.   Respiratory: Negative.   Cardiovascular: Positive for leg swelling. Negative for chest pain and palpitations.  Skin: Positive for wound.  Psychiatric/Behavioral: Positive for dysphoric mood. Negative for agitation, behavioral problems, confusion and hallucinations. The patient is nervous/anxious.        Objective:   Physical Exam Constitutional:      Comments: She walks with a limp and she requires assistance to walk  Cardiovascular:     Rate and Rhythm: Normal rate and regular rhythm.     Pulses: Normal pulses.     Heart sounds: Normal heart sounds.  Pulmonary:     Effort: Pulmonary effort is normal.     Breath sounds: Normal breath sounds.  Musculoskeletal:     Comments: The right lower leg has 2+ edema and she is tender along the lateral right thigh. The  right calf is tender, no cords are felt   Skin:    Comments: Left upper arm has a 3 cm raised annular lesion with a central umbilication. This is red and tender, and it appears to be granulomatous in origin. There is sebaceous material coming out of it   Neurological:     Mental Status: She is alert.  Psychiatric:        Thought Content: Thought content normal.        Judgment: Judgment normal.     Comments: Very anxious            Assessment & Plan:  For the leg swelling, we will set up another venous doppler to rule out obstructions. Her pain may be from an iliotibial band syndrome. I suggested she see her orthopedist (Dr. Rush Farmer) soon for this. For the arm lesion, she will see Dr. Renda Rolls tomorrow. For the depression and anxiety we will add back Paxil 10 mg daily to the Wellbutrin. Recheck in 3 weeks.  Alysia Penna, MD

## 2020-09-30 DIAGNOSIS — L02414 Cutaneous abscess of left upper limb: Secondary | ICD-10-CM | POA: Diagnosis not present

## 2020-10-07 ENCOUNTER — Ambulatory Visit (HOSPITAL_COMMUNITY)
Admission: RE | Admit: 2020-10-07 | Payer: Medicare Other | Source: Ambulatory Visit | Attending: Family Medicine | Admitting: Family Medicine

## 2020-10-13 DIAGNOSIS — L929 Granulomatous disorder of the skin and subcutaneous tissue, unspecified: Secondary | ICD-10-CM | POA: Diagnosis not present

## 2020-11-03 ENCOUNTER — Other Ambulatory Visit (HOSPITAL_COMMUNITY): Payer: Self-pay | Admitting: Family Medicine

## 2020-11-03 DIAGNOSIS — M7989 Other specified soft tissue disorders: Secondary | ICD-10-CM

## 2020-11-03 DIAGNOSIS — M79661 Pain in right lower leg: Secondary | ICD-10-CM

## 2020-11-07 ENCOUNTER — Ambulatory Visit (HOSPITAL_COMMUNITY)
Admission: RE | Admit: 2020-11-07 | Discharge: 2020-11-07 | Disposition: A | Discharge status: Home / Self Care | Payer: Medicare Other | Source: Ambulatory Visit | Attending: Cardiovascular Disease | Admitting: Cardiovascular Disease

## 2020-11-07 ENCOUNTER — Other Ambulatory Visit: Payer: Self-pay

## 2020-11-07 DIAGNOSIS — M79661 Pain in right lower leg: Secondary | ICD-10-CM

## 2020-12-12 ENCOUNTER — Telehealth: Payer: Self-pay | Admitting: *Deleted

## 2020-12-12 NOTE — Telephone Encounter (Signed)
Patient has been approved for Prolia.  No charge for the patient as long as there is no deductible or it has been met.  Nurse visit scheduled for 12/18/20.

## 2020-12-18 ENCOUNTER — Ambulatory Visit (INDEPENDENT_AMBULATORY_CARE_PROVIDER_SITE_OTHER): Payer: Medicare Other

## 2020-12-18 ENCOUNTER — Other Ambulatory Visit: Payer: Self-pay

## 2020-12-18 DIAGNOSIS — M8949 Other hypertrophic osteoarthropathy, multiple sites: Secondary | ICD-10-CM

## 2020-12-18 DIAGNOSIS — M159 Polyosteoarthritis, unspecified: Secondary | ICD-10-CM

## 2020-12-18 MED ORDER — DENOSUMAB 60 MG/ML ~~LOC~~ SOSY
60.0000 mg | PREFILLED_SYRINGE | Freq: Once | SUBCUTANEOUS | Status: AC
  Administered 2020-12-18: 60 mg via SUBCUTANEOUS

## 2020-12-18 NOTE — Progress Notes (Signed)
Patient here for prolia.  Injection given, patient tolerated well.  Patient will be contacted when next injection is approved by insurance.

## 2020-12-19 ENCOUNTER — Other Ambulatory Visit: Payer: Self-pay | Admitting: Family Medicine

## 2020-12-22 ENCOUNTER — Ambulatory Visit: Payer: Medicare Other

## 2020-12-22 ENCOUNTER — Ambulatory Visit: Payer: Medicare Other | Admitting: Orthopaedic Surgery

## 2020-12-31 ENCOUNTER — Encounter: Payer: Self-pay | Admitting: Orthopaedic Surgery

## 2020-12-31 ENCOUNTER — Ambulatory Visit (INDEPENDENT_AMBULATORY_CARE_PROVIDER_SITE_OTHER): Payer: Medicare Other | Admitting: Orthopaedic Surgery

## 2020-12-31 ENCOUNTER — Ambulatory Visit (INDEPENDENT_AMBULATORY_CARE_PROVIDER_SITE_OTHER): Payer: Medicare Other

## 2020-12-31 DIAGNOSIS — M25571 Pain in right ankle and joints of right foot: Secondary | ICD-10-CM

## 2020-12-31 DIAGNOSIS — Z96641 Presence of right artificial hip joint: Secondary | ICD-10-CM

## 2020-12-31 NOTE — Progress Notes (Signed)
The patient is almost a year out from hardware removal from the right hip and conversion to a total hip arthroplasty.  She only had home therapy after that.  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  She is also had a right shoulder replacement.  I replaced the hip and Dr. Mardelle Matte replaced the shoulder.  She has a lot of anxiety and has not been out of the house much.  She is 74 and very active but she is afraid she is going to fall.  She feels like she drags that leg.  She already has x-rays that have shown the right hip replacement has done well.  She denies any hip pain at all but again feels like she drags the right leg.  She has had some ankle pain as well.  She feels like there was some discoloration of the right ankle in terms of the skin.  Previous Doppler studies were negative for any blood flow issues or DVT.  Examination of her right hip shows excellent strength in her right hip.  There is good strength in her right knee as well as her right foot and ankle.  Her foot is well-perfused.  Her calf is soft.  Her ankles ligamentously stable.  Her knee is ligamentously stable.  This is on the right side.  X-rays of the right ankle with normal.  I did go over her previous hip x-rays in terms of her original fracture and then her nonunion followed by eventually her hip replacement.  She is someone who is definitely deconditioned and would really benefit from formal outpatient physical therapy to work on strengthening her bilateral lower extremities and working on her balance and coordination to give her confidence with getting out more.  I have recommended at least a cane for now her opposite hand if she has anxiety about being a fall risk.  She does not look unsteady in her gait to me today and her leg lengths are equal and she had been short for a while due to the original fracture.  She is interested in outpatient physical therapy and her daughter is  with her encouraged her for this as well.  We will work on getting this scheduled and I will see her back in about 6 weeks to see how she is done overall.  All questions and concerns were answered and addressed.

## 2021-01-01 ENCOUNTER — Other Ambulatory Visit: Payer: Self-pay

## 2021-01-01 DIAGNOSIS — M25571 Pain in right ankle and joints of right foot: Secondary | ICD-10-CM

## 2021-01-10 ENCOUNTER — Other Ambulatory Visit: Payer: Self-pay | Admitting: Family Medicine

## 2021-01-12 ENCOUNTER — Telehealth: Payer: Self-pay

## 2021-01-12 ENCOUNTER — Ambulatory Visit: Payer: Medicare Other | Admitting: Physical Therapy

## 2021-01-12 NOTE — Telephone Encounter (Signed)
Received a call from triage line asking for Emily Horton to call patient.  Spoke to patient VIA phone and she states that she is having anxiety when she trys to leave her house for appointments, going to the store, ect....  She doesn't like to ask for help and gets very frustrated and feels like she is locked in her home.  She canceled her PT appt today due to this even though her cousin came by to take her to her appt.  She is feeling like she has no confidence and is wondering if her Paxil needs to be raised in dose.   LOV 09/29/20, FOV  None scheduled.    Please review and advise.  Thanks. Dm/cma

## 2021-01-12 NOTE — Telephone Encounter (Signed)
Called patient and scheduled a VV @ 2 pm 01/13/21.  Dm/cma

## 2021-01-12 NOTE — Telephone Encounter (Signed)
Set up an OV so we can discuss this  

## 2021-01-13 ENCOUNTER — Telehealth (INDEPENDENT_AMBULATORY_CARE_PROVIDER_SITE_OTHER): Payer: Medicare Other | Admitting: Family Medicine

## 2021-01-13 ENCOUNTER — Encounter: Payer: Self-pay | Admitting: Family Medicine

## 2021-01-13 DIAGNOSIS — F339 Major depressive disorder, recurrent, unspecified: Secondary | ICD-10-CM | POA: Diagnosis not present

## 2021-01-13 DIAGNOSIS — F411 Generalized anxiety disorder: Secondary | ICD-10-CM | POA: Diagnosis not present

## 2021-01-13 MED ORDER — BUSPIRONE HCL 15 MG PO TABS: 15.0000 mg | ORAL_TABLET | Freq: Two times a day (BID) | ORAL | 2 refills | Status: DC

## 2021-01-13 NOTE — Progress Notes (Signed)
Subjective:    Patient ID: Emily Horton, female    DOB: 29-Feb-1946, 75 y.o.   MRN: [id number]  HPI Virtual Visit via Video Note  I connected with the patient on 01/13/21 at  2:00 PM EST by a video enabled telemedicine application and verified that I am speaking with the correct person using two identifiers.  Location patient: home Location provider:work or home office Persons participating in the virtual visit: patient, provider  I discussed the limitations of evaluation and management by telemedicine and the availability of in person appointments. The patient expressed understanding and agreed to proceed.   HPI: Here to discuss her anxiety about leaving her house and going outside. This has been an issue for the past 2 years. She thinks part of this is due to fear of catching the Covid-19 virus, and part of it is due to a fear that she may fall again and seriously injure herself. She his taking Wellbutrin and Paxil. Her depression is stable. She sleeps well.    ROS: See pertinent positives and negatives per HPI.  Past Medical History:  Diagnosis Date  . Colon polyps 09-25-2007   Colonoscopy  . Depression   . Diverticulosis of colon (without mention of hemorrhage) 09-25-2007   Colonoscopy  . GERD (gastroesophageal reflux disease)   . Gynecological examination    sees Dr. Carren Rang   . Hyperlipidemia     Past Surgical History:  Procedure Laterality Date  . COLONOSCOPY  12/30/2015   per Dr. Henrene Pastor, benign  polyps, repeat in 5  yrs.   . ESOPHAGOGASTRODUODENOSCOPY  06-30-12   per Dr. Sharlett Iles, reflux but no Barretts seen   . HARDWARE REMOVAL Right 02/01/2020   Procedure: HARDWARE REMOVAL;  Surgeon: Mcarthur Rossetti, MD;  Location: [facility];  Service: Orthopedics;  Laterality: Right;  . INTRAMEDULLARY (IM) NAIL INTERTROCHANTERIC Right 06/22/2019  . INTRAMEDULLARY (IM) NAIL INTERTROCHANTERIC Right 06/22/2019   Procedure: INTRAMEDULLARY (IM) NAIL INTERTROCHANTRIC;  Surgeon: Altamese , MD;  Location: [facility];  Service: Orthopedics;  Laterality: Right;  . REVERSE SHOULDER ARTHROPLASTY Right 01/29/2020   Procedure: RIGHT REVERSE SHOULDER ARTHROPLASTY;  Surgeon: Marchia Bond, MD;  Location: [facility];  Service: Orthopedics;  Laterality: Right;  . TOTAL HIP ARTHROPLASTY Right 02/01/2020   Procedure: TOTAL HIP ARTHROPLASTY ANTERIOR APPROACH;  Surgeon: Mcarthur Rossetti, MD;  Location: [facility];  Service: Orthopedics;  Laterality: Right;    Family History  Problem Relation Age of Onset  . Breast cancer Maternal Grandmother   . Crohn's disease Father   . Colon polyps Father   . Stroke Paternal Grandfather      Current Outpatient Medications:  .  buPROPion (WELLBUTRIN XL) 300 MG 24 hr tablet, Take 1 tablet (300 mg total) by mouth every morning., Disp: 90 tablet, Rfl: 3 .  busPIRone (BUSPAR) 15 MG tablet, Take 1 tablet (15 mg total) by mouth 2 (two) times daily., Disp: 60 tablet, Rfl: 2 .  cetirizine (ZYRTEC) 10 MG tablet, TAKE 1 TABLET(10 MG) BY MOUTH DAILY, Disp: 90 tablet, Rfl: 3 .  Cholecalciferol (VITAMIN D3) 125 MCG (5000 UT) TABS, Take 5,000 Units by mouth daily., Disp: , Rfl:  .  EPINEPHrine 0.3 mg/0.3 mL IJ SOAJ injection, Inject 0.3 mLs (0.3 mg total) into the muscle daily as needed for anaphylaxis., Disp: 1 each, Rfl: 0 .  erythromycin ophthalmic ointment, Place 1 application into the right eye 4 (four) times daily. For 1 week., Disp: 3.5 g, Rfl: 0 .  ferrous sulfate 325 (65  FE) MG tablet, Once A Day on Mon, Wed, Fri, Disp: 36 tablet, Rfl: 3 .  Menthol, Topical Analgesic, (BIOFREEZE) 4 % GEL, Apply topically. APPLY TO RIGHT SHOULDER Twice A Day - PRN, Disp: , Rfl:  .  omeprazole (PRILOSEC) 40 MG capsule, TAKE 1 CAPSULE BY MOUTH TWICE DAILY, Disp: 180 capsule, Rfl: 3 .  pantoprazole (PROTONIX) 40 MG tablet, Take 1 tablet (40 mg total) by mouth daily., Disp: 30 tablet, Rfl: 0 .  polyethylene glycol (MIRALAX / GLYCOLAX) 17 g packet, Take 17 g by mouth daily as  needed for moderate constipation or severe constipation., Disp: 14 each, Rfl: 0 .  sennosides-docusate sodium (SENOKOT-S) 8.6-50 MG tablet, Take 2 tablets by mouth daily., Disp: , Rfl:  .  triamcinolone cream (KENALOG) 0.1 %, Apply 1 application topically 2 (two) times daily., Disp: 453.6 g, Rfl: 3 .  ASPIRIN 81 PO, Take 81 mg by mouth daily. (Patient not taking: Reported on 01/13/2021), Disp: , Rfl:  .  hyoscyamine (LEVSIN SL) 0.125 MG SL tablet, Place 1 tablet (0.125 mg total) under the tongue every 4 (four) hours as needed. (Patient not taking: Reported on 01/13/2021), Disp: 60 tablet, Rfl: 11 .  LORazepam (ATIVAN) 0.5 MG tablet, Take 1 tablet (0.5 mg total) by mouth every 8 (eight) hours as needed for anxiety. (Patient not taking: Reported on 01/13/2021), Disp: 90 tablet, Rfl: 2 .  methocarbamol (ROBAXIN) 500 MG tablet, Take 1 tablet (500 mg total) by mouth every 6 (six) hours as needed for muscle spasms. (Patient not taking: Reported on 01/13/2021), Disp: 60 tablet, Rfl: 2 .  ondansetron (ZOFRAN) 4 MG tablet, Take 1 tablet (4 mg total) by mouth every 8 (eight) hours as needed for nausea or vomiting. (Patient not taking: Reported on 01/13/2021), Disp: 10 tablet, Rfl: 0 .  oxycodone (OXY-IR) 5 MG capsule, Take 1 capsule (5 mg total) by mouth at bedtime as needed. (Patient not taking: Reported on 01/13/2021), Disp: 30 capsule, Rfl: 0  EXAM:  VITALS per patient if applicable:  GENERAL: alert, oriented, appears well and in no acute distress  HEENT: atraumatic, conjunttiva clear, no obvious abnormalities on inspection of external nose and ears  NECK: normal movements of the head and neck  LUNGS: on inspection no signs of respiratory distress, breathing rate appears normal, no obvious gross SOB, gasping or wheezing  CV: no obvious cyanosis  MS: moves all visible extremities without noticeable abnormality  PSYCH/NEURO: pleasant and cooperative, no obvious depression or anxiety, speech and thought  processing grossly intact  ASSESSMENT AND PLAN: She has generalized anxiety and possibly a form of OCD. We will stop the Paxil and instead, she will try Buspar 15 mg BID along with the Wellbutrin. She will follow up with [place] in 3 weeks. I also suggested she talk to a therapist and she agreed.  Alysia Penna, MD  Discussed the following assessment and plan:  No diagnosis found.     I discussed the assessment and treatment plan with the patient. The patient was provided an opportunity to ask questions and all were answered. The patient agreed with the plan and demonstrated an understanding of the instructions.   The patient was advised to call back or seek an in-person evaluation if the symptoms worsen or if the condition fails to improve as anticipated.     Review of Systems     Objective:   Physical Exam        Assessment & Plan:

## 2021-01-23 ENCOUNTER — Encounter: Payer: Self-pay | Admitting: Rehabilitative and Restorative Service Providers"

## 2021-01-23 ENCOUNTER — Ambulatory Visit (INDEPENDENT_AMBULATORY_CARE_PROVIDER_SITE_OTHER): Payer: Medicare Other | Admitting: Rehabilitative and Restorative Service Providers"

## 2021-01-23 ENCOUNTER — Other Ambulatory Visit: Payer: Self-pay

## 2021-01-23 DIAGNOSIS — R262 Difficulty in walking, not elsewhere classified: Secondary | ICD-10-CM

## 2021-01-23 DIAGNOSIS — M25571 Pain in right ankle and joints of right foot: Secondary | ICD-10-CM | POA: Diagnosis not present

## 2021-01-23 DIAGNOSIS — M6281 Muscle weakness (generalized): Secondary | ICD-10-CM | POA: Diagnosis not present

## 2021-01-23 NOTE — Therapy (Signed)
[facility] [place], [place], [address] Phone: [phone]   Fax:  [phone]  Physical Therapy Evaluation  Patient Details  Name: Emily Horton MRN: [id number] Date of Birth: 12/05/1946 Referring Provider (PT): Dr. Ninfa Linden   Encounter Date: 01/23/2021   PT End of Session - 01/23/21 1058    Visit Number 1    Number of Visits 18    Date for PT Re-Evaluation 04/03/21    Authorization Type Medicare    Progress Note Due on Visit 10    PT Start Time 1105    PT Stop Time 1145    PT Time Calculation (min) 40 min    Equipment Utilized During Treatment Gait belt;Other (comment)   FWW   Activity Tolerance Patient tolerated treatment well    Behavior During Therapy WFL for tasks assessed/performed           Past Medical History:  Diagnosis Date  . Colon polyps 09-25-2007   Colonoscopy  . Depression   . Diverticulosis of colon (without mention of hemorrhage) 09-25-2007   Colonoscopy  . GERD (gastroesophageal reflux disease)   . Gynecological examination    sees Dr. Carren Rang   . Hyperlipidemia     Past Surgical History:  Procedure Laterality Date  . COLONOSCOPY  12/30/2015   per Dr. Henrene Pastor, benign  polyps, repeat in 5  yrs.   . ESOPHAGOGASTRODUODENOSCOPY  06-30-12   per Dr. Sharlett Iles, reflux but no Barretts seen   . HARDWARE REMOVAL Right 02/01/2020   Procedure: HARDWARE REMOVAL;  Surgeon: Mcarthur Rossetti, MD;  [facility];  Service: Orthopedics;  Laterality: Right;  . INTRAMEDULLARY (IM) NAIL INTERTROCHANTERIC Right 06/22/2019  . INTRAMEDULLARY (IM) NAIL INTERTROCHANTERIC Right 06/22/2019   Procedure: INTRAMEDULLARY (IM) NAIL INTERTROCHANTRIC;  Surgeon: Altamese Cartwright, MD;  Location: [facility];  Service: Orthopedics;  Laterality: Right;  . REVERSE SHOULDER ARTHROPLASTY Right 01/29/2020   Procedure: RIGHT REVERSE SHOULDER ARTHROPLASTY;  Surgeon: Marchia Bond, MD;  [facility];  Service: Orthopedics;  Laterality: Right;   . TOTAL HIP ARTHROPLASTY Right 02/01/2020   Procedure: TOTAL HIP ARTHROPLASTY ANTERIOR APPROACH;  Surgeon: Mcarthur Rossetti, MD;  [facility];  Service: Orthopedics;  Laterality: Right;    There were no vitals filed for this visit.    Subjective Assessment - 01/23/21 1108    Subjective Pt. stated she is scared of falling and having difficulty getting and moving around.  Pt. stated Rt side is more troublesome with strength and movement difficulty.   Pt. stated fell on shoulder 01/08/2020.  Pt. stated continued trouble over the year.  Pt. stated no pain in leg, sometimes shoulder pain.  Pt. stated having rehabilitation in [facility] in last year or so.  Pt. stated in last 6 months, Pt. indicated about 3 fall.  Soreness from falls but not painful.  All falls at home when walking independent.  Has walker at home and uses it some.  Pt. indicated having upstairs but doesnt have to go up.  Pt. stated rail with 3 steps outside.  Lives alone.    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    Currently in Pain? No/denies    Pain Location Ankle    Pain Orientation Right              [facility] PT Assessment - 01/23/21 0001  Assessment   Medical Diagnosis Rt ankle pain/foot pain, LE weakness    Referring Provider (PT) Dr. Ninfa Linden    Onset Date/Surgical Date 01/07/20      Precautions   Precautions None      Restrictions   Weight Bearing Restrictions No      Balance Screen   Has the patient fallen in the past 6 months Yes    How many times? 3    Has the patient had a decrease in activity level because of a fear of falling?  Yes    Is the patient reluctant to leave their home because of a fear of falling?  Yes      [place] residence      Prior Function   Level of Independence Independent      Cognition    Overall Cognitive Status Within Functional Limits for tasks assessed      Observation/Other Assessments   Focus on Therapeutic Outcomes (FOTO)  intake 45%      ROM / Strength   AROM / PROM / Strength Strength;PROM;AROM      AROM   AROM Assessment Site Ankle    Right/Left Ankle Left;Right      Strength   Strength Assessment Site Ankle;Knee;Hip    Right/Left Hip Left;Right    Right Hip Flexion 5/5    Right Hip ABduction 2+/5    Left Hip Flexion 5/5    Left Hip ABduction 2+/5    Right/Left Knee Left;Right    Right Knee Flexion 5/5    Right Knee Extension 4/5    Left Knee Flexion 5/5    Left Knee Extension 4/5    Right/Left Ankle Right;Left    Right Ankle Dorsiflexion 5/5    Left Ankle Dorsiflexion 5/5      Transfers   Five time sit to stand comments  14.65    Comments Able to perform sit to stand from 18 inch chair s UE      Ambulation/Gait   Assistive device Rolling walker    Gait Pattern Decreased weight shift to right;Lateral hip instability;Antalgic;Wide base of support   Decreased step length on Rt LE   Gait Comments Arrived c SPC use variable to UE c grasping of objects with other hand.  Marked difficulty in balance control.  FWW use provided stability while in clinic distances < 150 ft)      Balance   Balance Assessed Yes      Standardized Balance Assessment   Standardized Balance Assessment Berg Balance Test;Timed Up and Go Test      Berg Balance Test   Sit to Stand Able to stand without using hands and stabilize independently    Standing Unsupported Able to stand safely 2 minutes    Sitting with Back Unsupported but Feet Supported on Floor or Stool Able to sit safely and securely 2 minutes    Stand to Sit Sits safely with minimal use of hands    Transfers Able to transfer safely, minor use of hands    Standing Unsupported with Eyes Closed Able to stand 10 seconds with supervision    Standing Unsupported with Feet Together Needs help to attain position but  able to stand for 30 seconds with feet together    From Standing, Reach Forward with Outstretched Arm Can reach forward >12 cm safely (5")    From Standing Position, Pick up Object from Floor Unable to pick up and needs supervision    From  Standing Position, Turn to Look Behind Over each Shoulder Turn sideways only but maintains balance    Turn 360 Degrees Needs close supervision or verbal cueing    Standing Unsupported, Alternately Place Feet on Step/Stool Able to complete >2 steps/needs minimal assist    Standing Unsupported, One Foot in Front Able to take small step independently and hold 30 seconds    Standing on One Leg Tries to lift leg/unable to hold 3 seconds but remains standing independently    Total Score 35      Timed Up and Go Test   Normal TUG (seconds) 39   FWW, second attempt 32 seconds                     Objective measurements completed on examination: See above findings.       [facility] PT Treatment/Exercise - 01/23/21 0001      Exercises   Exercises Other Exercises    Other Exercises  HEP instruction/performance c cues for techniques and trial set consisting of seated marching 3 x 10, sit to stand 3 x 10 per handout.  Education on importance of FWW use vs. SPC for safety.                  PT Education - 01/23/21 1215    Education Details HEP, POC.  Education on importance of FWW use due to fall risk vs. SPC and grasping other items.  Education on possible mental health (anxiety) therapy per Pt.'s question.    Person(s) Educated Patient    Methods Explanation;Demonstration;Verbal cues;Handout    Comprehension Returned demonstration;Verbalized understanding            PT Short Term Goals - 01/23/21 1058      PT SHORT TERM GOAL #1   Title Patient will demonstrate independent use of home exercise program to maintain progress from in clinic treatments.    Time 3    Period Weeks    Status New    Target Date 02/13/21              PT Long Term Goals - 01/23/21 1208      PT LONG TERM GOAL #1   Title Patient will demonstrate independent use of home exercise program to facilitate ability to maintain/progress functional gains from skilled physical therapy services.    Time 10    Period Weeks    Status New    Target Date 04/03/21      PT LONG TERM GOAL #2   Title Pt. will demonstrate bilateral hip MMT > or = 4/5 throughout to facilitate improved stabilty in ambulation, transfers.    Time 10    Period Weeks    Status New    Target Date 04/03/21      PT LONG TERM GOAL #3   Title Pt. will demonstrate BERG > 45 to indicated reduced fall risk.    Time 10    Period Weeks    Status New    Target Date 04/03/21      PT LONG TERM GOAL #4   Title Pt. will demonstrate TUG score < 14 seconds to facilitate reduced fall risk, improved stability in ambulation, performed c LRAD.    Time 10    Period Weeks    Status New    Target Date 04/03/21      PT LONG TERM GOAL #5   Title Pt. will demonstrate FOTO outcome > or = 56.    Time  10    Period Weeks    Status New    Target Date 04/03/21                  Plan - 01/23/21 1210    Clinical Impression Statement Patient is a 75 y.o. female who comes to clinic with complaints of history of Rt ankle/leg pain (none reported at this time) with mobility, strength and movement coordination deficits that impair their ability to perform usual daily and recreational functional activities without increase difficulty/symptoms at this time.  Noted increased fall risk and anxiety related to balance control observed in clinic today. Patient to benefit from skilled PT services to address impairments and limitations to improve to previous level of function without restriction secondary to condition.    Personal Factors and Comorbidities Other   anxiety, fear of falling, Rt THA   Examination-Activity Limitations Squat;Bend;Bed Mobility;Stairs;Stand;Transfers;Lift;Locomotion Level     Examination-Participation Restrictions Community Activity;Yard Work;Laundry;Cleaning   community integration   Stability/Clinical Decision Making Evolving/Moderate complexity    Clinical Decision Making Moderate    Rehab Potential Fair    PT Frequency 2x / week    PT Duration Other (comment)   10 weeks   PT Treatment/Interventions ADLs/Self Care Home Management;Cryotherapy;Electrical Stimulation;Iontophoresis 4mg /ml Dexamethasone;Moist Heat;Balance training;Therapeutic exercise;Therapeutic activities;Functional mobility training;Stair training;Gait training;DME Instruction;Ultrasound;Patient/family education;Manual techniques;Dry needling;Passive range of motion;Spinal Manipulations;Joint Manipulations    PT Next Visit Plan Reassess HEP, progress hip strength in clinic and in HEP (table based), static non compliant surface balance intervention    PT Home Exercise Plan [email or web address]    Consulted and Agree with Plan of Care Patient           Patient will benefit from skilled therapeutic intervention in order to improve the following deficits and impairments:  Abnormal gait,Decreased endurance,Decreased strength,Decreased activity tolerance,Decreased balance,Decreased mobility,Difficulty walking,Impaired perceived functional ability,Decreased coordination,Decreased safety awareness  Visit Diagnosis: Pain in right ankle and joints of right foot  Muscle weakness (generalized)  Difficulty in walking, not elsewhere classified     Problem List Patient Active Problem List   Diagnosis Date Noted  . Generalized anxiety disorder 01/13/2021  . Fall 04/30/2020  . Osteoarthritis, multiple sites 04/30/2020  . Pneumonia 03/11/2020  . Muscle spasm 03/10/2020  . Right flank pain 03/10/2020  . Right shoulder pain 02/08/2020  . Right hip pain 02/08/2020  . Blood loss anemia 02/08/2020  . 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 Retained orthopedic  hardware   . Closed fracture of right proximal humerus 01/29/2020  . Closed right hip fracture, initial encounter ([place]) 06/22/2019  . Environmental and seasonal allergies 06/12/2018  . Dyslipidemia 08/26/2017  . IBS (irritable bowel syndrome) 08/26/2017  . Angio-edema 11/03/2016  . Diverticulosis of colon without hemorrhage 09/30/2015  . Depression, recurrent ([place]) 08/22/2007  . GERD 07/04/2007    Scot Jun, PT, DPT, OCS, ATC 01/23/21  12:25 PM    [facility] [address] [place], [place], [address] Phone: [phone]   Fax:  [phone]  Name: Emily Horton MRN: [id number] Date of Birth: [birth year]

## 2021-01-23 NOTE — Patient Instructions (Signed)
Access Code: [email or web address] URL: [email or web address] Date: 01/23/2021 Prepared by: Scot Jun  Exercises Seated March - 2 x daily - 7 x weekly - 3 sets - 10 reps Sit to Stand - 1 x daily - 7 x weekly - 3 sets - 10 reps

## 2021-01-28 ENCOUNTER — Encounter: Payer: Medicare Other | Admitting: Adult Health

## 2021-01-29 ENCOUNTER — Encounter: Payer: Self-pay | Admitting: Adult Health

## 2021-01-29 ENCOUNTER — Other Ambulatory Visit: Payer: Self-pay

## 2021-01-29 ENCOUNTER — Telehealth (INDEPENDENT_AMBULATORY_CARE_PROVIDER_SITE_OTHER): Payer: Medicare Other | Admitting: Adult Health

## 2021-01-29 VITALS — Wt 200.0 lb

## 2021-01-29 DIAGNOSIS — R300 Dysuria: Secondary | ICD-10-CM | POA: Diagnosis not present

## 2021-01-29 DIAGNOSIS — R110 Nausea: Secondary | ICD-10-CM | POA: Diagnosis not present

## 2021-01-29 LAB — POC URINALSYSI DIPSTICK (AUTOMATED)
Bilirubin, UA: NEGATIVE
Blood, UA: NEGATIVE
Glucose, UA: NEGATIVE
Ketones, UA: NEGATIVE
Nitrite, UA: NEGATIVE
Protein, UA: NEGATIVE
Spec Grav, UA: >=1.03 — AB (ref 1.010–1.025)
Urobilinogen, UA: 0.2 E.U./dL
pH, UA: 6 (ref 5.0–8.0)

## 2021-01-29 MED ORDER — CIPROFLOXACIN HCL 500 MG PO TABS: 500.0000 mg | ORAL_TABLET | Freq: Two times a day (BID) | ORAL | 0 refills | Status: DC

## 2021-01-29 MED ORDER — ONDANSETRON HCL 4 MG PO TABS: 4.0000 mg | ORAL_TABLET | Freq: Three times a day (TID) | ORAL | 0 refills | Status: DC | PRN

## 2021-01-29 NOTE — Progress Notes (Signed)
Virtual Visit via Video Note  I connected with Emily Horton on 01/29/21 at  4:00 PM EST by a video enabled telemedicine application and verified that I am speaking with the correct person using two identifiers.  Location patient: home Location provider:work or home office Persons participating in the virtual visit: patient, provider  I discussed the limitations of evaluation and management by telemedicine and the availability of in person appointments. The patient expressed understanding and agreed to proceed.   HPI: 75 year old female who is being evaluated today for concern of a urinary tract infection.  She reports that her symptoms started roughly 2 weeks ago.  Symptoms include nausea, intermittent burning with urination, questionable frequency, odorous urine, low back pain, and lower abdominal pain.  She denies fevers or chills.  She does not drink a lot of water throughout the day but does try to supplement with juices.  Has had UTI in the past and feels as though this is similar   ROS: See pertinent positives and negatives per HPI.  Past Medical History:  Diagnosis Date  . Colon polyps 09-25-2007   Colonoscopy  . Depression   . Diverticulosis of colon (without mention of hemorrhage) 09-25-2007   Colonoscopy  . GERD (gastroesophageal reflux disease)   . Gynecological examination    sees Dr. Carren Rang   . Hyperlipidemia     Past Surgical History:  Procedure Laterality Date  . COLONOSCOPY  12/30/2015   per Dr. Henrene Pastor, benign  polyps, repeat in 5  yrs.   . ESOPHAGOGASTRODUODENOSCOPY  06-30-12   per Dr. Sharlett Iles, reflux but no Barretts seen   . HARDWARE REMOVAL Right 02/01/2020   Procedure: HARDWARE REMOVAL;  Surgeon: Mcarthur Rossetti, MD;  Location: [facility];  Service: Orthopedics;  Laterality: Right;  . INTRAMEDULLARY (IM) NAIL INTERTROCHANTERIC Right 06/22/2019  . INTRAMEDULLARY (IM) NAIL INTERTROCHANTERIC Right 06/22/2019   Procedure: INTRAMEDULLARY (IM) NAIL  INTERTROCHANTRIC;  Surgeon: Altamese McMullen, MD;  Location: [facility];  Service: Orthopedics;  Laterality: Right;  . REVERSE SHOULDER ARTHROPLASTY Right 01/29/2020   Procedure: RIGHT REVERSE SHOULDER ARTHROPLASTY;  Surgeon: Marchia Bond, MD;  Location[facility];  Service: Orthopedics;  Laterality: Right;  . TOTAL HIP ARTHROPLASTY Right 02/01/2020   Procedure: TOTAL HIP ARTHROPLASTY ANTERIOR APPROACH;  Surgeon: Mcarthur Rossetti, MD;  [facility];  Service: Orthopedics;  Laterality: Right;    Family History  Problem Relation Age of Onset  . Breast cancer Maternal Grandmother   . Crohn's disease Father   . Colon polyps Father   . Stroke Paternal Grandfather        Current Outpatient Medications:  .  buPROPion (WELLBUTRIN XL) 300 MG 24 hr tablet, Take 1 tablet (300 mg total) by mouth every morning., Disp: 90 tablet, Rfl: 3 .  busPIRone (BUSPAR) 15 MG tablet, Take 1 tablet (15 mg total) by mouth 2 (two) times daily., Disp: 60 tablet, Rfl: 2 .  cetirizine (ZYRTEC) 10 MG tablet, TAKE 1 TABLET(10 MG) BY MOUTH DAILY, Disp: 90 tablet, Rfl: 3 .  ciprofloxacin (CIPRO) 500 MG tablet, Take 1 tablet (500 mg total) by mouth 2 (two) times daily., Disp: 6 tablet, Rfl: 0 .  EPINEPHrine 0.3 mg/0.3 mL IJ SOAJ injection, Inject 0.3 mLs (0.3 mg total) into the muscle daily as needed for anaphylaxis., Disp: 1 each, Rfl: 0 .  ferrous sulfate 325 (65 FE) MG tablet, Once A Day on Mon, Wed, Fri, Disp: 36 tablet, Rfl: 3 .  hyoscyamine (LEVSIN SL) 0.125 MG SL tablet, Place 1 tablet (  0.125 mg total) under the tongue every 4 (four) hours as needed., Disp: 60 tablet, Rfl: 11 .  Menthol, Topical Analgesic, (BIOFREEZE) 4 % GEL, Apply topically. APPLY TO RIGHT SHOULDER Twice A Day - PRN, Disp: , Rfl:  .  omeprazole (PRILOSEC) 40 MG capsule, TAKE 1 CAPSULE BY MOUTH TWICE DAILY, Disp: 180 capsule, Rfl: 3 .  ondansetron (ZOFRAN) 4 MG tablet, Take 1 tablet (4 mg total) by mouth every 8 (eight) hours as needed for nausea or  vomiting., Disp: 10 tablet, Rfl: 0 .  ondansetron (ZOFRAN) 4 MG tablet, Take 1 tablet (4 mg total) by mouth every 8 (eight) hours as needed for nausea or vomiting., Disp: 20 tablet, Rfl: 0 .  polyethylene glycol (MIRALAX / GLYCOLAX) 17 g packet, Take 17 g by mouth daily as needed for moderate constipation or severe constipation., Disp: 14 each, Rfl: 0 .  sennosides-docusate sodium (SENOKOT-S) 8.6-50 MG tablet, Take 2 tablets by mouth daily., Disp: , Rfl:  .  triamcinolone cream (KENALOG) 0.1 %, Apply 1 application topically 2 (two) times daily., Disp: 453.6 g, Rfl: 3 .  Cholecalciferol (VITAMIN D3) 125 MCG (5000 UT) TABS, Take 5,000 Units by mouth daily. (Patient not taking: Reported on 01/29/2021), Disp: , Rfl:  .  LORazepam (ATIVAN) 0.5 MG tablet, Take 1 tablet (0.5 mg total) by mouth every 8 (eight) hours as needed for anxiety. (Patient not taking: Reported on 01/29/2021), Disp: 90 tablet, Rfl: 2  EXAM:  VITALS per patient if applicable:  GENERAL: alert, oriented, appears well and in no acute distress  HEENT: atraumatic, conjunttiva clear, no obvious abnormalities on inspection of external nose and ears  NECK: normal movements of the head and neck  LUNGS: on inspection no signs of respiratory distress, breathing rate appears normal, no obvious gross SOB, gasping or wheezing  CV: no obvious cyanosis  MS: moves all visible extremities without noticeable abnormality  PSYCH/NEURO: pleasant and cooperative, no obvious depression or anxiety, speech and thought processing grossly intact  ASSESSMENT AND PLAN:  Discussed the following assessment and plan:  1. Dysuria  - POCT Urinalysis Dipstick (Automated)- + leuks. SG 1.030. No nit. Will treat for UTI d/t symptoms  - ciprofloxacin (CIPRO) 500 MG tablet; Take 1 tablet (500 mg total) by mouth 2 (two) times daily.  Dispense: 6 tablet; Refill: 0 - Culture, Urine; Future  2. Nausea  - ondansetron (ZOFRAN) 4 MG tablet; Take 1 tablet (4 mg  total) by mouth every 8 (eight) hours as needed for nausea or vomiting.  Dispense: 20 tablet; Refill: 0     I discussed the assessment and treatment plan with the patient. The patient was provided an opportunity to ask questions and all were answered. The patient agreed with the plan and demonstrated an understanding of the instructions.   The patient was advised to call back or seek an in-person evaluation if the symptoms worsen or if the condition fails to improve as anticipated.   Dorothyann Peng, NP

## 2021-01-29 NOTE — Progress Notes (Signed)
Erroneous entry

## 2021-02-04 ENCOUNTER — Ambulatory Visit (INDEPENDENT_AMBULATORY_CARE_PROVIDER_SITE_OTHER): Payer: Medicare Other | Admitting: Rehabilitative and Restorative Service Providers"

## 2021-02-04 ENCOUNTER — Encounter: Payer: Self-pay | Admitting: Rehabilitative and Restorative Service Providers"

## 2021-02-04 ENCOUNTER — Other Ambulatory Visit: Payer: Self-pay

## 2021-02-04 DIAGNOSIS — M6281 Muscle weakness (generalized): Secondary | ICD-10-CM

## 2021-02-04 DIAGNOSIS — R262 Difficulty in walking, not elsewhere classified: Secondary | ICD-10-CM

## 2021-02-04 DIAGNOSIS — M25571 Pain in right ankle and joints of right foot: Secondary | ICD-10-CM | POA: Diagnosis not present

## 2021-02-04 NOTE — Therapy (Addendum)
[facility] [address] [place], [place], [address] Phone: [phone]   Fax:  [phone]  Physical Therapy Treatment  Patient Details  Name: Emily Horton MRN: [id number] Date of Birth: 13-Apr-1946 Referring Provider (PT): Dr. Ninfa Linden   Encounter Date: 02/04/2021   PT End of Session - 02/04/21 1111    Visit Number 2    Number of Visits 18    Date for PT Re-Evaluation 04/03/21    Authorization Type Medicare    Progress Note Due on Visit 10    PT Start Time 1059    PT Stop Time 1140    PT Time Calculation (min) 41 min    Equipment Utilized During Treatment Gait belt;Other (comment)   FWW   Activity Tolerance Patient tolerated treatment well    Behavior During Therapy WFL for tasks assessed/performed           Past Medical History:  Diagnosis Date  . Colon polyps 09-25-2007   Colonoscopy  . Depression   . Diverticulosis of colon (without mention of hemorrhage) 09-25-2007   Colonoscopy  . GERD (gastroesophageal reflux disease)   . Gynecological examination    sees Dr. Carren Rang   . Hyperlipidemia     Past Surgical History:  Procedure Laterality Date  . COLONOSCOPY  12/30/2015   per Dr. Henrene Pastor, benign  polyps, repeat in 5  yrs.   . ESOPHAGOGASTRODUODENOSCOPY  06-30-12   per Dr. Sharlett Iles, reflux but no Barretts seen   . HARDWARE REMOVAL Right 02/01/2020   Procedure: HARDWARE REMOVAL;  Surgeon: Mcarthur Rossetti, MD;  [facility];  Service: Orthopedics;  Laterality: Right;  . INTRAMEDULLARY (IM) NAIL INTERTROCHANTERIC Right 06/22/2019  . INTRAMEDULLARY (IM) NAIL INTERTROCHANTERIC Right 06/22/2019   Procedure: INTRAMEDULLARY (IM) NAIL INTERTROCHANTRIC;  Surgeon: Altamese Gearhart, MD;  Location: [facility];  Service: Orthopedics;  Laterality: Right;  . REVERSE SHOULDER ARTHROPLASTY Right 01/29/2020   Procedure: RIGHT REVERSE SHOULDER ARTHROPLASTY;  Surgeon: Marchia Bond, MD;  [facility];  Service: Orthopedics;  Laterality: Right;   . TOTAL HIP ARTHROPLASTY Right 02/01/2020   Procedure: TOTAL HIP ARTHROPLASTY ANTERIOR APPROACH;  Surgeon: Mcarthur Rossetti, MD;  [facility];  Service: Orthopedics;  Laterality: Right;    There were no vitals filed for this visit.   Subjective Assessment - 02/04/21 1110    Subjective Pt. indicated continued fear of falling.  Pt. stated she has been using FWW as we discussed.  Pt. stated doing well on standing and sitting exercise.  Raising leg is hard but she stated she was doing it.    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    Currently in Pain? No/denies                             [facility] Adult PT Treatment/Exercise - 02/04/21 0001      Neuro Re-ed    Neuro Re-ed Details  standing feet together EC 1 min x 2, modified tandem stance 2 mins bilateral, alt toe tapping 4 inch step 2 x 15 bilateral.  All performed in // bars c occasional HHA, SBA/CGA for patient confidence.      Exercises   Exercises Knee/Hip;Ankle      Knee/Hip Exercises: Seated   Marching 2 sets;10 reps;Both   no UE or back support   Sit to  Sand without UE support;10 reps   18 inch chair         Nustep lvl 5 10 mins          PT Short Term Goals - 02/04/21 1131      PT SHORT TERM GOAL #1   Title Patient will demonstrate independent use of home exercise program to maintain progress from in clinic treatments.    Time 3    Period Weeks    Status On-going    Target Date 02/13/21             PT Long Term Goals - 01/23/21 1208      PT LONG TERM GOAL #1   Title Patient will demonstrate independent use of home exercise program to facilitate ability to maintain/progress functional gains from skilled physical therapy services.    Time 10    Period Weeks    Status New    Target Date 04/03/21      PT LONG TERM GOAL #2   Title Pt.  will demonstrate bilateral hip MMT > or = 4/5 throughout to facilitate improved stabilty in ambulation, transfers.    Time 10    Period Weeks    Status New    Target Date 04/03/21      PT LONG TERM GOAL #3   Title Pt. will demonstrate BERG > 45 to indicated reduced fall risk.    Time 10    Period Weeks    Status New    Target Date 04/03/21      PT LONG TERM GOAL #4   Title Pt. will demonstrate TUG score < 14 seconds to facilitate reduced fall risk, improved stability in ambulation, performed c LRAD.    Time 10    Period Weeks    Status New    Target Date 04/03/21      PT LONG TERM GOAL #5   Title Pt. will demonstrate FOTO outcome > or = 56.    Time 10    Period Weeks    Status New    Target Date 04/03/21                 Plan - 02/04/21 1129    Clinical Impression Statement Continued presence of Pt.'s anxiety during intervention related to fear of falling.  Cues given consistently both verbally and tactil (CGA) to create environment of safety and confidence.  Continued skilled PT services to help progress towards goals.    Personal Factors and Comorbidities Other   anxiety, fear of falling, Rt THA   Examination-Activity Limitations Squat;Bend;Bed Mobility;Stairs;Stand;Transfers;Lift;Locomotion Level    Examination-Participation Restrictions Community Activity;Yard Work;Laundry;Cleaning   community integration   Stability/Clinical Decision Making Evolving/Moderate complexity    Rehab Potential Fair    PT Frequency 2x / week    PT Duration Other (comment)   10 weeks   PT Treatment/Interventions ADLs/Self Care Home Management;Cryotherapy;Electrical Stimulation;Iontophoresis 4mg /ml Dexamethasone;Moist Heat;Balance training;Therapeutic exercise;Therapeutic activities;Functional mobility training;Stair training;Gait training;DME Instruction;Ultrasound;Patient/family education;Manual techniques;Dry needling;Passive range of motion;Spinal Manipulations;Joint Manipulations    PT  Next Visit Plan progress hip strength in clinic and in HEP (table based), static non compliant surface balance intervention    PT Home Exercise Plan [id number]    Consulted and Agree with Plan of Care Patient           Patient will benefit from skilled therapeutic intervention in order to improve the following deficits and impairments:  Abnormal gait,Decreased endurance,Decreased strength,Decreased activity tolerance,Decreased balance,Decreased mobility,Difficulty walking,Impaired perceived functional ability,Decreased coordination,Decreased  safety awareness  Visit Diagnosis: Pain in right ankle and joints of right foot  Muscle weakness (generalized)  Difficulty in walking, not elsewhere classified     Problem List Patient Active Problem List   Diagnosis Date Noted  . Generalized anxiety disorder 01/13/2021  . Fall 04/30/2020  . Osteoarthritis, multiple sites 04/30/2020  . Pneumonia 03/11/2020  . Muscle spasm 03/10/2020  . Right flank pain 03/10/2020  . Right shoulder pain 02/08/2020  . Right hip pain 02/08/2020  . Blood loss anemia 02/08/2020  . 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 Retained orthopedic hardware   . Closed fracture of right proximal humerus 01/29/2020  . Closed right hip fracture, initial encounter ([place]) 06/22/2019  . Environmental and seasonal allergies 06/12/2018  . Dyslipidemia 08/26/2017  . IBS (irritable bowel syndrome) 08/26/2017  . Angio-edema 11/03/2016  . Diverticulosis of colon without hemorrhage 09/30/2015  . Depression, recurrent ([place]) 08/22/2007  . GERD 07/04/2007    Scot Jun, PT, DPT, OCS, ATC 02/04/21  11:38 AM   Corrected nustep documentation. Scot Jun, PT, DPT, OCS, ATC 02/06/21  11:52 AM      [facility] [address] [place], [place], [address] Phone: [phone]   Fax:  [phone]  Name: ALSIE YOUNES MRN:  [id number] Date of Birth: [birth year]

## 2021-02-06 ENCOUNTER — Ambulatory Visit (INDEPENDENT_AMBULATORY_CARE_PROVIDER_SITE_OTHER): Payer: Medicare Other | Admitting: Rehabilitative and Restorative Service Providers"

## 2021-02-06 ENCOUNTER — Other Ambulatory Visit: Payer: Self-pay

## 2021-02-06 ENCOUNTER — Encounter: Payer: Self-pay | Admitting: Rehabilitative and Restorative Service Providers"

## 2021-02-06 DIAGNOSIS — R262 Difficulty in walking, not elsewhere classified: Secondary | ICD-10-CM

## 2021-02-06 DIAGNOSIS — M6281 Muscle weakness (generalized): Secondary | ICD-10-CM | POA: Diagnosis not present

## 2021-02-06 DIAGNOSIS — M25571 Pain in right ankle and joints of right foot: Secondary | ICD-10-CM

## 2021-02-06 NOTE — Therapy (Signed)
[facility] [address] [place], [place], [address] Phone: [phone]   Fax:  [phone]  Physical Therapy Treatment  Patient Details  Name: Emily Horton MRN: [id number] Date of Birth: 04-12-1946 Referring Provider (PT): Dr. Ninfa Linden   Encounter Date: 02/06/2021   PT End of Session - 02/06/21 1145    Visit Number 3    Number of Visits 18    Date for PT Re-Evaluation 04/03/21    Authorization Type Medicare    Progress Note Due on Visit 10    PT Start Time 1140    PT Stop Time 1220    PT Time Calculation (min) 40 min    Equipment Utilized During Treatment Gait belt;Other (comment)   FWW   Activity Tolerance Patient tolerated treatment well    Behavior During Therapy WFL for tasks assessed/performed           Past Medical History:  Diagnosis Date  . Colon polyps 09-25-2007   Colonoscopy  . Depression   . Diverticulosis of colon (without mention of hemorrhage) 09-25-2007   Colonoscopy  . GERD (gastroesophageal reflux disease)   . Gynecological examination    sees Dr. Carren Rang   . Hyperlipidemia     Past Surgical History:  Procedure Laterality Date  . COLONOSCOPY  12/30/2015   per Dr. Henrene Pastor, benign  polyps, repeat in 5  yrs.   . ESOPHAGOGASTRODUODENOSCOPY  06-30-12   per Dr. Sharlett Iles, reflux but no Barretts seen   . HARDWARE REMOVAL Right 02/01/2020   Procedure: HARDWARE REMOVAL;  Surgeon: Mcarthur Rossetti, MD;  [facility];  Service: Orthopedics;  Laterality: Right;  . INTRAMEDULLARY (IM) NAIL INTERTROCHANTERIC Right 06/22/2019  . INTRAMEDULLARY (IM) NAIL INTERTROCHANTERIC Right 06/22/2019   Procedure: INTRAMEDULLARY (IM) NAIL INTERTROCHANTRIC;  Surgeon: Altamese Pima, MD;  Location: [facility] [place];  Service: Orthopedics;  Laterality: Right;  . REVERSE SHOULDER ARTHROPLASTY Right 01/29/2020   Procedure: RIGHT REVERSE SHOULDER ARTHROPLASTY;  Surgeon: Marchia Bond, MD;  [facility];  Service: Orthopedics;  Laterality: Right;   . TOTAL HIP ARTHROPLASTY Right 02/01/2020   Procedure: TOTAL HIP ARTHROPLASTY ANTERIOR APPROACH;  Surgeon: Mcarthur Rossetti, MD;  [facility];  Service: Orthopedics;  Laterality: Right;    There were no vitals filed for this visit.   Subjective Assessment - 02/06/21 1145    Subjective Pt. indicated feeling some tired after last visit but overall felt ok after doing last clinical visit.    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    Currently in Pain? No/denies                             [facility] Adult PT Treatment/Exercise - 02/06/21 0001      Therapeutic Activites    Therapeutic Activities Other Therapeutic Activities    Other Therapeutic Activities Ambulation c FWW c SBA 160 ft for endurance training to improve functional walking distance for daily activity.      Neuro Re-ed    Neuro Re-ed Details  BIG inspired fwd stepping x 15 bilateral c CGA, tandem stance modified 90 sec each c CGA/occasional min A      Knee/Hip Exercises: Aerobic   Nustep Lvl 5 12 mins      Knee/Hip Exercises: Seated   Long Arc Quad 2 sets;10 reps;Both   2.5 lbs  Sit to Sand 15 reps;without UE support   18 inch chair no UE, slow lower focus. (billed as Therapeutic activity for functional mobility improvement)                   PT Short Term Goals - 02/04/21 1131      PT SHORT TERM GOAL #1   Title Patient will demonstrate independent use of home exercise program to maintain progress from in clinic treatments.    Time 3    Period Weeks    Status On-going    Target Date 02/13/21             PT Long Term Goals - 01/23/21 1208      PT LONG TERM GOAL #1   Title Patient will demonstrate independent use of home exercise program to facilitate ability to maintain/progress functional gains from skilled physical therapy services.     Time 10    Period Weeks    Status New    Target Date 04/03/21      PT LONG TERM GOAL #2   Title Pt. will demonstrate bilateral hip MMT > or = 4/5 throughout to facilitate improved stabilty in ambulation, transfers.    Time 10    Period Weeks    Status New    Target Date 04/03/21      PT LONG TERM GOAL #3   Title Pt. will demonstrate BERG > 45 to indicated reduced fall risk.    Time 10    Period Weeks    Status New    Target Date 04/03/21      PT LONG TERM GOAL #4   Title Pt. will demonstrate TUG score < 14 seconds to facilitate reduced fall risk, improved stability in ambulation, performed c LRAD.    Time 10    Period Weeks    Status New    Target Date 04/03/21      PT LONG TERM GOAL #5   Title Pt. will demonstrate FOTO outcome > or = 56.    Time 10    Period Weeks    Status New    Target Date 04/03/21                 Plan - 02/06/21 1206    Clinical Impression Statement Adjustment today to move balance intervention to area with no bar/hand hold assist to improve training.  Pt. performed better despite anxiety reports.  Pt. still demonstrates marked deficits in balance control.    Personal Factors and Comorbidities Other   anxiety, fear of falling, Rt THA   Examination-Activity Limitations Squat;Bend;Bed Mobility;Stairs;Stand;Transfers;Lift;Locomotion Level    Examination-Participation Restrictions Community Activity;Yard Work;Laundry;Cleaning   community integration   Stability/Clinical Decision Making Evolving/Moderate complexity    Rehab Potential Fair    PT Frequency 2x / week    PT Duration Other (comment)   10 weeks   PT Treatment/Interventions ADLs/Self Care Home Management;Cryotherapy;Electrical Stimulation;Iontophoresis 4mg /ml Dexamethasone;Moist Heat;Balance training;Therapeutic exercise;Therapeutic activities;Functional mobility training;Stair training;Gait training;DME Instruction;Ultrasound;Patient/family education;Manual techniques;Dry  needling;Passive range of motion;Spinal Manipulations;Joint Manipulations    PT Next Visit Plan BIG inspired stepping all directions to improve control in stepping and weight shift, overall LE endurance and strengthening.    PT Home Exercise Plan [id number]    Consulted and Agree with Plan of Care Patient           Patient will benefit from skilled therapeutic intervention in order to improve the following deficits and impairments:  Abnormal gait,Decreased endurance,Decreased strength,Decreased activity tolerance,Decreased  balance,Decreased mobility,Difficulty walking,Impaired perceived functional ability,Decreased coordination,Decreased safety awareness  Visit Diagnosis: Pain in right ankle and joints of right foot  Muscle weakness (generalized)  Difficulty in walking, not elsewhere classified     Problem List Patient Active Problem List   Diagnosis Date Noted  . Generalized anxiety disorder 01/13/2021  . Fall 04/30/2020  . Osteoarthritis, multiple sites 04/30/2020  . Pneumonia 03/11/2020  . Muscle spasm 03/10/2020  . Right flank pain 03/10/2020  . Right shoulder pain 02/08/2020  . Right hip pain 02/08/2020  . Blood loss anemia 02/08/2020  . 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 Retained orthopedic hardware   . Closed fracture of right proximal humerus 01/29/2020  . Closed right hip fracture, initial encounter ([place]) 06/22/2019  . Environmental and seasonal allergies 06/12/2018  . Dyslipidemia 08/26/2017  . IBS (irritable bowel syndrome) 08/26/2017  . Angio-edema 11/03/2016  . Diverticulosis of colon without hemorrhage 09/30/2015  . Depression, recurrent ([place]) 08/22/2007  . GERD 07/04/2007   Scot Jun, PT, DPT, OCS, ATC 02/06/21  12:22 PM    [facility] [address] [place], [place], [address] Phone: [phone]   Fax:  [phone]  Name: Emily Horton MRN:  [id number] Date of Birth: [birth year]

## 2021-02-10 ENCOUNTER — Ambulatory Visit (INDEPENDENT_AMBULATORY_CARE_PROVIDER_SITE_OTHER): Payer: Medicare Other | Admitting: Psychologist

## 2021-02-10 DIAGNOSIS — F320 Major depressive disorder, single episode, mild: Secondary | ICD-10-CM | POA: Diagnosis not present

## 2021-02-10 DIAGNOSIS — F411 Generalized anxiety disorder: Secondary | ICD-10-CM | POA: Diagnosis not present

## 2021-02-11 ENCOUNTER — Encounter: Payer: Self-pay | Admitting: Rehabilitative and Restorative Service Providers"

## 2021-02-11 ENCOUNTER — Encounter: Payer: Self-pay | Admitting: Orthopaedic Surgery

## 2021-02-11 ENCOUNTER — Other Ambulatory Visit: Payer: Self-pay

## 2021-02-11 ENCOUNTER — Ambulatory Visit (INDEPENDENT_AMBULATORY_CARE_PROVIDER_SITE_OTHER): Payer: Medicare Other | Admitting: Rehabilitative and Restorative Service Providers"

## 2021-02-11 ENCOUNTER — Ambulatory Visit (INDEPENDENT_AMBULATORY_CARE_PROVIDER_SITE_OTHER): Payer: Medicare Other | Admitting: Orthopaedic Surgery

## 2021-02-11 DIAGNOSIS — Z96641 Presence of right artificial hip joint: Secondary | ICD-10-CM | POA: Diagnosis not present

## 2021-02-11 DIAGNOSIS — M25571 Pain in right ankle and joints of right foot: Secondary | ICD-10-CM

## 2021-02-11 DIAGNOSIS — M6281 Muscle weakness (generalized): Secondary | ICD-10-CM

## 2021-02-11 DIAGNOSIS — R262 Difficulty in walking, not elsewhere classified: Secondary | ICD-10-CM | POA: Diagnosis not present

## 2021-02-11 NOTE — Therapy (Signed)
[facility] Physical Therapy [address] [place], [place], [address] Phone: [phone]   Fax:  [phone]  Physical Therapy Treatment  Patient Details  Name: Emily Horton MRN: [id number] Date of Birth: [birth year] Referring Provider (PT): Dr. Ninfa Linden   Encounter Date: 02/11/2021   PT End of Session - 02/11/21 1102    Visit Number 4    Number of Visits 18    Date for PT Re-Evaluation 04/03/21    Authorization Type Medicare    Progress Note Due on Visit 10    PT Start Time 1059    PT Stop Time 1139    PT Time Calculation (min) 40 min    Equipment Utilized During Treatment Gait belt;Other (comment)   FWW   Activity Tolerance Patient tolerated treatment well    Behavior During Therapy WFL for tasks assessed/performed           Past Medical History:  Diagnosis Date  . Colon polyps 09-25-2007   Colonoscopy  . Depression   . Diverticulosis of colon (without mention of hemorrhage) 09-25-2007   Colonoscopy  . GERD (gastroesophageal reflux disease)   . Gynecological examination    sees Dr. Carren Rang   . Hyperlipidemia     Past Surgical History:  Procedure Laterality Date  . COLONOSCOPY  12/30/2015   per Dr. Henrene Pastor, benign  polyps, repeat in 5  yrs.   . ESOPHAGOGASTRODUODENOSCOPY  06-30-12   per Dr. Sharlett Iles, reflux but no Barretts seen   . HARDWARE REMOVAL Right 02/01/2020   Procedure: HARDWARE REMOVAL;  Surgeon: Mcarthur Rossetti, MD;  [facility]: [facility];  Service: Orthopedics;  Laterality: Right;  . INTRAMEDULLARY (IM) NAIL INTERTROCHANTERIC Right 06/22/2019  . INTRAMEDULLARY (IM) NAIL INTERTROCHANTERIC Right 06/22/2019   Procedure: INTRAMEDULLARY (IM) NAIL INTERTROCHANTRIC;  Surgeon: Altamese Emory, MD;  Location: [place];  Service: Orthopedics;  Laterality: Right;  . REVERSE SHOULDER ARTHROPLASTY Right 01/29/2020   Procedure: RIGHT REVERSE SHOULDER ARTHROPLASTY;  Surgeon: Marchia Bond, MD;  [facility];  Service: Orthopedics;  Laterality: Right;   . TOTAL HIP ARTHROPLASTY Right 02/01/2020   Procedure: TOTAL HIP ARTHROPLASTY ANTERIOR APPROACH;  Surgeon: Mcarthur Rossetti, MD;  [facility];  Service: Orthopedics;  Laterality: Right;    There were no vitals filed for this visit.   Subjective Assessment - 02/11/21 1101    Subjective Pt. stated some "nerve pain" this morning throughout back and neck but not there now.    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    Currently in Pain? No/denies                             [facility] Adult PT Treatment/Exercise - 02/11/21 0001      Therapeutic Activites    Other Therapeutic Activities sit to stand c eccentric focus for safety improvement 2 x 10 18 inch chair, step up 4 inch x 12 bilateral to improve functional mobility.      Neuro Re-ed    Neuro Re-ed Details  BIG program inspired stepping fwd, lateral, reverse 20 x each bilateral c CGA, church pew anterior/posterior sway control 20x CGA,      Knee/Hip Exercises: Aerobic   Nustep Lvl 6 12 mins                    PT Short Term  Goals - 02/11/21 1129      PT SHORT TERM GOAL #1   Title Patient will demonstrate independent use of home exercise program to maintain progress from in clinic treatments.    Time 3    Period Weeks    Status Achieved    Target Date 02/13/21             PT Long Term Goals - 01/23/21 1208      PT LONG TERM GOAL #1   Title Patient will demonstrate independent use of home exercise program to facilitate ability to maintain/progress functional gains from skilled physical therapy services.    Time 10    Period Weeks    Status New    Target Date 04/03/21      PT LONG TERM GOAL #2   Title Pt. will demonstrate bilateral hip MMT > or = 4/5 throughout to facilitate improved stabilty in ambulation, transfers.    Time 10    Period Weeks     Status New    Target Date 04/03/21      PT LONG TERM GOAL #3   Title Pt. will demonstrate BERG > 45 to indicated reduced fall risk.    Time 10    Period Weeks    Status New    Target Date 04/03/21      PT LONG TERM GOAL #4   Title Pt. will demonstrate TUG score < 14 seconds to facilitate reduced fall risk, improved stability in ambulation, performed c LRAD.    Time 10    Period Weeks    Status New    Target Date 04/03/21      PT LONG TERM GOAL #5   Title Pt. will demonstrate FOTO outcome > or = 56.    Time 10    Period Weeks    Status New    Target Date 04/03/21                 Plan - 02/11/21 1131    Clinical Impression Statement Pt. demonstrated improved control in stepping activity today.  Ankle stategy fair to poor at this time for balance correction.  Continued evidence of cognitive anxiety for fear of falling present.    Personal Factors and Comorbidities Other   anxiety, fear of falling, Rt THA   Examination-Activity Limitations Squat;Bend;Bed Mobility;Stairs;Stand;Transfers;Lift;Locomotion Level    Examination-Participation Restrictions Community Activity;Yard Work;Laundry;Cleaning   community integration   Stability/Clinical Decision Making Evolving/Moderate complexity    Rehab Potential Fair    PT Frequency 2x / week    PT Duration Other (comment)   10 weeks   PT Treatment/Interventions ADLs/Self Care Home Management;Cryotherapy;Electrical Stimulation;Iontophoresis 4mg /ml Dexamethasone;Moist Heat;Balance training;Therapeutic exercise;Therapeutic activities;Functional mobility training;Stair training;Gait training;DME Instruction;Ultrasound;Patient/family education;Manual techniques;Dry needling;Passive range of motion;Spinal Manipulations;Joint Manipulations    PT Next Visit Plan Continue intervention for improved ankle/hip strategy for balance control, compliant surfaces as appropriate.  LTG assessment next visit.    PT Home Exercise Plan [id number]     Consulted and Agree with Plan of Care Patient           Patient will benefit from skilled therapeutic intervention in order to improve the following deficits and impairments:  Abnormal gait,Decreased endurance,Decreased strength,Decreased activity tolerance,Decreased balance,Decreased mobility,Difficulty walking,Impaired perceived functional ability,Decreased coordination,Decreased safety awareness  Visit Diagnosis: Pain in right ankle and joints of right foot  Muscle weakness (generalized)  Difficulty in walking, not elsewhere classified     Problem List Patient Active Problem List   Diagnosis Date Noted  .  Generalized anxiety disorder 01/13/2021  . Fall 04/30/2020  . Osteoarthritis, multiple sites 04/30/2020  . Pneumonia 03/11/2020  . Muscle spasm 03/10/2020  . Right flank pain 03/10/2020  . Right shoulder pain 02/08/2020  . Right hip pain 02/08/2020  . Blood loss anemia 02/08/2020  . 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 Retained orthopedic hardware   . Closed fracture of right proximal humerus 01/29/2020  . Closed right hip fracture, initial encounter ([place]) 06/22/2019  . Environmental and seasonal allergies 06/12/2018  . Dyslipidemia 08/26/2017  . IBS (irritable bowel syndrome) 08/26/2017  . Angio-edema 11/03/2016  . Diverticulosis of colon without hemorrhage 09/30/2015  . Depression, recurrent ([place]) 08/22/2007  . GERD 07/04/2007    Scot Jun, PT, DPT, OCS, ATC 02/11/21  11:33 AM    [facility] Physical Therapy [address] [place], [place], [address] Phone: [phone]   Fax:  [phone]  Name: Emily Horton MRN: [id number] Date of Birth: December 25, 1945

## 2021-02-11 NOTE — Progress Notes (Signed)
The patient has been working out with physical therapy on her balance and coordination.  We actually removed hardware from her right hip over a year ago and converted her to a hip replacement.  She feels like therapy has helped with her overall conditioning.  She is 75 years old.  They have recommended she use a walker and she is using that.  I have also recommended that she do as much home exercise is that she can.  Her right hip moves smoothly and fluidly.  We did x-ray the right hip at her last visit in January and it look good.  At this point follow-up from my standpoint can be as needed.  If there are any issues at all she will let us know.

## 2021-02-13 ENCOUNTER — Encounter: Payer: Self-pay | Admitting: Rehabilitative and Restorative Service Providers"

## 2021-02-13 ENCOUNTER — Other Ambulatory Visit: Payer: Self-pay

## 2021-02-13 ENCOUNTER — Ambulatory Visit (INDEPENDENT_AMBULATORY_CARE_PROVIDER_SITE_OTHER): Payer: Medicare Other | Admitting: Rehabilitative and Restorative Service Providers"

## 2021-02-13 DIAGNOSIS — M25571 Pain in right ankle and joints of right foot: Secondary | ICD-10-CM

## 2021-02-13 DIAGNOSIS — R262 Difficulty in walking, not elsewhere classified: Secondary | ICD-10-CM

## 2021-02-13 DIAGNOSIS — M6281 Muscle weakness (generalized): Secondary | ICD-10-CM | POA: Diagnosis not present

## 2021-02-13 NOTE — Therapy (Signed)
[facility] Physical Therapy [address] [place], [place], [address] Phone: [phone]   Fax:  [phone]  Physical Therapy Treatment  Patient Details  Name: Emily Horton MRN: [id number] Date of Birth: 1946/06/07 Referring Provider (PT): Dr. Ninfa Linden   Encounter Date: 02/13/2021   PT End of Session - 02/13/21 1143    Visit Number 5    Number of Visits 18    Date for PT Re-Evaluation 04/03/21    Authorization Type Medicare    Progress Note Due on Visit 10    PT Start Time 1140    PT Stop Time 1220    PT Time Calculation (min) 40 min    Equipment Utilized During Treatment Gait belt;Other (comment)   FWW   Activity Tolerance Patient tolerated treatment well    Behavior During Therapy WFL for tasks assessed/performed           Past Medical History:  Diagnosis Date  . Colon polyps 09-25-2007   Colonoscopy  . Depression   . Diverticulosis of colon (without mention of hemorrhage) 09-25-2007   Colonoscopy  . GERD (gastroesophageal reflux disease)   . Gynecological examination    sees Dr. Carren Rang   . Hyperlipidemia     Past Surgical History:  Procedure Laterality Date  . COLONOSCOPY  12/30/2015   per Dr. Henrene Pastor, benign  polyps, repeat in 5  yrs.   . ESOPHAGOGASTRODUODENOSCOPY  06-30-12   per Dr. Sharlett Iles, reflux but no Barretts seen   . HARDWARE REMOVAL Right 02/01/2020   Procedure: HARDWARE REMOVAL;  Surgeon: Mcarthur Rossetti, MD;  [facility];  Service: Orthopedics;  Laterality: Right;  . INTRAMEDULLARY (IM) NAIL INTERTROCHANTERIC Right 06/22/2019  . INTRAMEDULLARY (IM) NAIL INTERTROCHANTERIC Right 06/22/2019   Procedure: INTRAMEDULLARY (IM) NAIL INTERTROCHANTRIC;  Surgeon: Altamese Dawson, MD;  Location: [place];  Service: Orthopedics;  Laterality: Right;  . REVERSE SHOULDER ARTHROPLASTY Right 01/29/2020   Procedure: RIGHT REVERSE SHOULDER ARTHROPLASTY;  Surgeon: Marchia Bond, MD;  [facility];  Service: Orthopedics;  Laterality: Right;   . TOTAL HIP ARTHROPLASTY Right 02/01/2020   Procedure: TOTAL HIP ARTHROPLASTY ANTERIOR APPROACH;  Surgeon: Mcarthur Rossetti, MD;  [facility];  Service: Orthopedics;  Laterality: Right;    There were no vitals filed for this visit.   Subjective Assessment - 02/13/21 1142    Subjective Pt. indicated no complaints from last visit.  Pt. 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    Currently in Pain? No/denies              [facility] PT Assessment - 02/13/21 0001      Assessment   Medical Diagnosis Rt ankle pain/foot pain, LE weakness    Referring Provider (PT) Dr. Ninfa Linden    Onset Date/Surgical Date 01/07/20      Timed Up and Go Test   Normal TUG (seconds) 14.5   FWW                        [facility] Adult PT Treatment/Exercise - 02/13/21 0001      Ambulation/Gait   Ambulation/Gait Yes    Gait Comments SPC use in Lt UE c cues for sequencing, placement c CGA and occasional Min A at times. 60 ft  Neuro Re-ed    Neuro Re-ed Details  BIG inspired fwd, reverse stepping x 15 bilateral CGA, standing on foam 3 mins c occasional head turns c CGA/MIN A at times      Knee/Hip Exercises: Seated   Other Seated Knee/Hip Exercises seated SLR 2 x 10 bilateral    Marching 1 set;15 reps   bilateral                   PT Short Term Goals - 02/11/21 1129      PT SHORT TERM GOAL #1   Title Patient will demonstrate independent use of home exercise program to maintain progress from in clinic treatments.    Time 3    Period Weeks    Status Achieved    Target Date 02/13/21             PT Long Term Goals - 02/13/21 1215      PT LONG TERM GOAL #1   Title Patient will demonstrate independent use of home exercise program to facilitate ability to  maintain/progress functional gains from skilled physical therapy services.    Time 10    Period Weeks    Status On-going    Target Date 04/03/21      PT LONG TERM GOAL #2   Title Pt. will demonstrate bilateral hip MMT > or = 4/5 throughout to facilitate improved stabilty in ambulation, transfers.    Time 10    Period Weeks    Status On-going    Target Date 04/03/21      PT LONG TERM GOAL #3   Title Pt. will demonstrate BERG > 45 to indicated reduced fall risk.    Time 10    Period Weeks    Status On-going    Target Date 04/03/21      PT LONG TERM GOAL #4   Title Pt. will demonstrate TUG score < 14 seconds to facilitate reduced fall risk, improved stability in ambulation, performed c LRAD.    Time 10    Period Weeks    Status On-going    Target Date 04/03/21      PT LONG TERM GOAL #5   Title Pt. will demonstrate FOTO outcome > or = 56.    Time 10    Period Weeks    Status On-going    Target Date 04/03/21                 Plan - 02/13/21 1212    Clinical Impression Statement Marked improvement in TUG noted today in comparison to evaluation.  SPC ambulation still requires safety assistance from clinician so not appropraite for home use at this time.  In all ambulation, fatigue leads to Rt LE foot clearing trouble.    Personal Factors and Comorbidities Other   anxiety, fear of falling, Rt THA   Examination-Activity Limitations Squat;Bend;Bed Mobility;Stairs;Stand;Transfers;Lift;Locomotion Level    Examination-Participation Restrictions Community Activity;Yard Work;Laundry;Cleaning   community integration   Stability/Clinical Decision Making Evolving/Moderate complexity    Rehab Potential Fair    PT Frequency 2x / week    PT Duration Other (comment)   10 weeks   PT Treatment/Interventions ADLs/Self Care Home Management;Cryotherapy;Electrical Stimulation;Iontophoresis 4mg /ml Dexamethasone;Moist Heat;Balance training;Therapeutic exercise;Therapeutic activities;Functional  mobility training;Stair training;Gait training;DME Instruction;Ultrasound;Patient/family education;Manual techniques;Dry needling;Passive range of motion;Spinal Manipulations;Joint Manipulations    PT Next Visit Plan Continue intervention for improved ankle/hip strategy for balance control, compliant surfaces as appropriate.    [place] [id number]    Consulted  and Agree with Plan of Care Patient           Patient will benefit from skilled therapeutic intervention in order to improve the following deficits and impairments:  Abnormal gait,Decreased endurance,Decreased strength,Decreased activity tolerance,Decreased balance,Decreased mobility,Difficulty walking,Impaired perceived functional ability,Decreased coordination,Decreased safety awareness  Visit Diagnosis: Pain in right ankle and joints of right foot  Muscle weakness (generalized)  Difficulty in walking, not elsewhere classified     Problem List Patient Active Problem List   Diagnosis Date Noted  . Generalized anxiety disorder 01/13/2021  . Fall 04/30/2020  . Osteoarthritis, multiple sites 04/30/2020  . Pneumonia 03/11/2020  . Muscle spasm 03/10/2020  . Right flank pain 03/10/2020  . Right shoulder pain 02/08/2020  . Right hip pain 02/08/2020  . Blood loss anemia 02/08/2020  . 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 Retained orthopedic hardware   . Closed fracture of right proximal humerus 01/29/2020  . Closed right hip fracture, initial encounter ([place]) 06/22/2019  . Environmental and seasonal allergies 06/12/2018  . Dyslipidemia 08/26/2017  . IBS (irritable bowel syndrome) 08/26/2017  . Angio-edema 11/03/2016  . Diverticulosis of colon without hemorrhage 09/30/2015  . Depression, recurrent ([place]) 08/22/2007  . GERD 07/04/2007   Scot Jun, PT, DPT, OCS, ATC 02/13/21  12:16 PM    [facility] [address] [place], [place], [address] Phone: [phone]   Fax:  [phone]  Name: ANIYLA HARLING MRN: [id number] Date of Birth: 1946-08-12

## 2021-02-17 ENCOUNTER — Encounter: Payer: Self-pay | Admitting: Internal Medicine

## 2021-02-18 ENCOUNTER — Encounter: Payer: Self-pay | Admitting: Rehabilitative and Restorative Service Providers"

## 2021-02-18 ENCOUNTER — Ambulatory Visit (INDEPENDENT_AMBULATORY_CARE_PROVIDER_SITE_OTHER): Payer: Medicare Other | Admitting: Rehabilitative and Restorative Service Providers"

## 2021-02-18 ENCOUNTER — Other Ambulatory Visit: Payer: Self-pay

## 2021-02-18 DIAGNOSIS — M25571 Pain in right ankle and joints of right foot: Secondary | ICD-10-CM

## 2021-02-18 DIAGNOSIS — M6281 Muscle weakness (generalized): Secondary | ICD-10-CM | POA: Diagnosis not present

## 2021-02-18 DIAGNOSIS — R262 Difficulty in walking, not elsewhere classified: Secondary | ICD-10-CM

## 2021-02-18 NOTE — Therapy (Signed)
[facility] Physical Therapy [address] [place], [place], [address] Phone: [phone]   Fax:  [phone]  Physical Therapy Treatment  Patient Details  Name: Emily Horton MRN: [id number] Date of Birth: Oct 06, 1946 Referring Provider (PT): Dr. Ninfa Linden   Encounter Date: 02/18/2021   PT End of Session - 02/18/21 1116    Visit Number 6    Number of Visits 18    Date for PT Re-Evaluation 04/03/21    Authorization Type Medicare    Progress Note Due on Visit 10    PT Start Time 1100    PT Stop Time 1140    PT Time Calculation (min) 40 min    Equipment Utilized During Treatment Gait belt;Other (comment)   FWW   Activity Tolerance Patient tolerated treatment well    Behavior During Therapy WFL for tasks assessed/performed           Past Medical History:  Diagnosis Date  . Colon polyps 09-25-2007   Colonoscopy  . Depression   . Diverticulosis of colon (without mention of hemorrhage) 09-25-2007   Colonoscopy  . GERD (gastroesophageal reflux disease)   . Gynecological examination    sees Dr. Carren Rang   . Hyperlipidemia     Past Surgical History:  Procedure Laterality Date  . COLONOSCOPY  12/30/2015   per Dr. Henrene Pastor, benign  polyps, repeat in 5  yrs.   . ESOPHAGOGASTRODUODENOSCOPY  06-30-12   per Dr. Sharlett Iles, reflux but no Barretts seen   . HARDWARE REMOVAL Right 02/01/2020   Procedure: HARDWARE REMOVAL;  Surgeon: Mcarthur Rossetti, MD;  [facility];  Service: Orthopedics;  Laterality: Right;  . INTRAMEDULLARY (IM) NAIL INTERTROCHANTERIC Right 06/22/2019  . INTRAMEDULLARY (IM) NAIL INTERTROCHANTERIC Right 06/22/2019   Procedure: INTRAMEDULLARY (IM) NAIL INTERTROCHANTRIC;  Surgeon: Altamese Cora, MD;  Location: [place];  Service: Orthopedics;  Laterality: Right;  . REVERSE SHOULDER ARTHROPLASTY Right 01/29/2020   Procedure: RIGHT REVERSE SHOULDER ARTHROPLASTY;  Surgeon: Marchia Bond, MD;  [facility];  Service: Orthopedics;  Laterality: Right;   . TOTAL HIP ARTHROPLASTY Right 02/01/2020   Procedure: TOTAL HIP ARTHROPLASTY ANTERIOR APPROACH;  Surgeon: Mcarthur Rossetti, MD;  [facility];  Service: Orthopedics;  Laterality: Right;    There were no vitals filed for this visit.   Subjective Assessment - 02/18/21 1115    Subjective Pt. stated she felt tight today, specifically in anterior/lateral Rt thigh.    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    Currently in Pain? No/denies                             [facility] Adult PT Treatment/Exercise - 02/18/21 0001      Ambulation/Gait   Gait Comments SPC use in clinic c CGA for movement between intervention areas in clinic room.      Therapeutic Activites    Other Therapeutic Activities sit to stands, ramp up/down in clinic x 6 c CGA c SPC, ambulation in clinic cSPC performed today and biled as therapeutic activity      Neuro Re-ed    Neuro Re-ed Details  CGA/occasional Min A in all neuro re-ed:  lateral stepping 10 ft x 5 each way, feet together stance c shoulder/over shoulder height cone placement 6 cones up/down - same with modified tandem stance bilateral)  Knee/Hip Exercises: Machines for Strengthening   Total Gym Leg Press DL 20x 75 lbs, SL 37 lbs 2 x 10 each LE      Knee/Hip Exercises: Seated   Other Seated Knee/Hip Exercises seated SLR 2 x 10 bilateral    Sit to Sand without UE support;20 reps   18 inch chair c eccentric focus                   PT Short Term Goals - 02/11/21 1129      PT SHORT TERM GOAL #1   Title Patient will demonstrate independent use of home exercise program to maintain progress from in clinic treatments.    Time 3    Period Weeks    Status Achieved    Target Date 02/13/21             PT Long Term Goals - 02/13/21 1215      PT LONG TERM GOAL #1   Title Patient  will demonstrate independent use of home exercise program to facilitate ability to maintain/progress functional gains from skilled physical therapy services.    Time 10    Period Weeks    Status On-going    Target Date 04/03/21      PT LONG TERM GOAL #2   Title Pt. will demonstrate bilateral hip MMT > or = 4/5 throughout to facilitate improved stabilty in ambulation, transfers.    Time 10    Period Weeks    Status On-going    Target Date 04/03/21      PT LONG TERM GOAL #3   Title Pt. will demonstrate BERG > 45 to indicated reduced fall risk.    Time 10    Period Weeks    Status On-going    Target Date 04/03/21      PT LONG TERM GOAL #4   Title Pt. will demonstrate TUG score < 14 seconds to facilitate reduced fall risk, improved stability in ambulation, performed c LRAD.    Time 10    Period Weeks    Status On-going    Target Date 04/03/21      PT LONG TERM GOAL #5   Title Pt. will demonstrate FOTO outcome > or = 56.    Time 10    Period Weeks    Status On-going    Target Date 04/03/21                 Plan - 02/18/21 1138    Clinical Impression Statement Conitnued anxiety and coginitive confidence role in ability to perform and participate in balance challenge progression.  While Pt. did require occasional Min A at times, most instances of Pt. requiring HHA were not by adverse balance control but anxiety related prompts.  Continued LE strengthening to benefit in transfers and progressive mobility.    Personal Factors and Comorbidities Other   anxiety, fear of falling, Rt THA   Examination-Activity Limitations Squat;Bend;Bed Mobility;Stairs;Stand;Transfers;Lift;Locomotion Level    Examination-Participation Restrictions Community Activity;Yard Work;Laundry;Cleaning   community integration   Stability/Clinical Decision Making Evolving/Moderate complexity    Rehab Potential Fair    PT Frequency 2x / week    PT Duration Other (comment)   10 weeks   PT  Treatment/Interventions ADLs/Self Care Home Management;Cryotherapy;Electrical Stimulation;Iontophoresis 4mg /ml Dexamethasone;Moist Heat;Balance training;Therapeutic exercise;Therapeutic activities;Functional mobility training;Stair training;Gait training;DME Instruction;Ultrasound;Patient/family education;Manual techniques;Dry needling;Passive range of motion;Spinal Manipulations;Joint Manipulations    PT Next Visit Plan Continue intervention for improved ankle/hip strategy for balance control, compliant surfaces as appropriate.  PT Home Exercise Plan [id number]    Consulted and Agree with Plan of Care Patient           Patient will benefit from skilled therapeutic intervention in order to improve the following deficits and impairments:  Abnormal gait,Decreased endurance,Decreased strength,Decreased activity tolerance,Decreased balance,Decreased mobility,Difficulty walking,Impaired perceived functional ability,Decreased coordination,Decreased safety awareness  Visit Diagnosis: Pain in right ankle and joints of right foot  Muscle weakness (generalized)  Difficulty in walking, not elsewhere classified     Problem List Patient Active Problem List   Diagnosis Date Noted  . Generalized anxiety disorder 01/13/2021  . Fall 04/30/2020  . Osteoarthritis, multiple sites 04/30/2020  . Pneumonia 03/11/2020  . Muscle spasm 03/10/2020  . Right flank pain 03/10/2020  . Right shoulder pain 02/08/2020  . Right hip pain 02/08/2020  . Blood loss anemia 02/08/2020  . 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 Retained orthopedic hardware   . Closed fracture of right proximal humerus 01/29/2020  . Closed right hip fracture, initial encounter ([name]) 06/22/2019  . Environmental and seasonal allergies 06/12/2018  . Dyslipidemia 08/26/2017  . IBS (irritable bowel syndrome) 08/26/2017  . Angio-edema 11/03/2016  . Diverticulosis of colon without  hemorrhage 09/30/2015  . Depression, recurrent ([place]) 08/22/2007  . GERD 07/04/2007    Scot Jun, PT, DPT, OCS, ATC 02/18/21  11:42 AM    [facility] Physical Therapy [address] [place], [place], [address] Phone: [phone]   Fax:  [phone]  Name: Emily Horton MRN: [id number] Date of Birth: 1946-08-08

## 2021-02-20 ENCOUNTER — Other Ambulatory Visit: Payer: Self-pay

## 2021-02-20 ENCOUNTER — Encounter: Payer: Self-pay | Admitting: Rehabilitative and Restorative Service Providers"

## 2021-02-20 ENCOUNTER — Ambulatory Visit (INDEPENDENT_AMBULATORY_CARE_PROVIDER_SITE_OTHER): Payer: Medicare Other | Admitting: Rehabilitative and Restorative Service Providers"

## 2021-02-20 DIAGNOSIS — R262 Difficulty in walking, not elsewhere classified: Secondary | ICD-10-CM | POA: Diagnosis not present

## 2021-02-20 DIAGNOSIS — M6281 Muscle weakness (generalized): Secondary | ICD-10-CM | POA: Diagnosis not present

## 2021-02-20 DIAGNOSIS — M25571 Pain in right ankle and joints of right foot: Secondary | ICD-10-CM

## 2021-02-20 NOTE — Therapy (Signed)
[facility] Physical Therapy [address] [place], [place], [address] Phone: [phone]   Fax:  [phone]  Physical Therapy Treatment  Patient Details  Name: Emily Horton MRN: [id number] Date of Birth: [birth year] Referring Provider (PT): Dr. Ninfa Linden   Encounter Date: 02/20/2021   PT End of Session - 02/20/21 1122    Visit Number 7    Number of Visits 18    Date for PT Re-Evaluation 04/03/21    Authorization Type Medicare    Progress Note Due on Visit 10    PT Start Time 1100    PT Stop Time 1140    PT Time Calculation (min) 40 min    Equipment Utilized During Treatment Gait belt;Other (comment)   FWW   Activity Tolerance Patient tolerated treatment well    Behavior During Therapy WFL for tasks assessed/performed           Past Medical History:  Diagnosis Date  . Colon polyps 09-25-2007   Colonoscopy  . Depression   . Diverticulosis of colon (without mention of hemorrhage) 09-25-2007   Colonoscopy  . GERD (gastroesophageal reflux disease)   . Gynecological examination    sees Dr. Carren Rang   . Hyperlipidemia     Past Surgical History:  Procedure Laterality Date  . COLONOSCOPY  12/30/2015   per Dr. Henrene Pastor, benign  polyps, repeat in 5  yrs.   . ESOPHAGOGASTRODUODENOSCOPY  06-30-12   per Dr. Sharlett Iles, reflux but no Barretts seen   . HARDWARE REMOVAL Right 02/01/2020   Procedure: HARDWARE REMOVAL;  Surgeon: Mcarthur Rossetti, MD;  Location: [facility];  Service: Orthopedics;  Laterality: Right;  . INTRAMEDULLARY (IM) NAIL INTERTROCHANTERIC Right 06/22/2019  . INTRAMEDULLARY (IM) NAIL INTERTROCHANTERIC Right 06/22/2019   Procedure: INTRAMEDULLARY (IM) NAIL INTERTROCHANTRIC;  Surgeon: Altamese Bascom, MD;  Location: [place];  Service: Orthopedics;  Laterality: Right;  . REVERSE SHOULDER ARTHROPLASTY Right 01/29/2020   Procedure: RIGHT REVERSE SHOULDER ARTHROPLASTY;  Surgeon: Marchia Bond, MD;  Location: [facility];  Service: Orthopedics;  Laterality: Right;   . TOTAL HIP ARTHROPLASTY Right 02/01/2020   Procedure: TOTAL HIP ARTHROPLASTY ANTERIOR APPROACH;  Surgeon: Mcarthur Rossetti, MD;  [facility]: [facility];  Service: Orthopedics;  Laterality: Right;    There were no vitals filed for this visit.   Subjective Assessment - 02/20/21 1119    Subjective Pt. stated feeling tired after last visit but just having one of those icky days today, a bit more off balance.    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    Currently in Pain? No/denies                             [facility] Adult PT Treatment/Exercise - 02/20/21 0001      Ambulation/Gait   Gait Comments SPC use in clinic c CGA for movement between intervention areas in clinic room.      Therapeutic Activites    Other Therapeutic Activities sit to stand x 10, ramp up/down x 3 c SPC and CGA      Knee/Hip Exercises: Aerobic   Nustep Lvl 6 16 mins      Knee/Hip Exercises: Machines for Strengthening   Total Gym Leg Press DL 75 lbs 25x, SL 37 lbs 2 x 10 each LE      Knee/Hip Exercises: Standing   Hip  Abduction 15 reps;Both    Hip Extension 15 reps;Both      Knee/Hip Exercises: Seated   Marching 2 sets;10 reps;Both                    PT Short Term Goals - 02/11/21 1129      PT SHORT TERM GOAL #1   Title Patient will demonstrate independent use of home exercise program to maintain progress from in clinic treatments.    Time 3    Period Weeks    Status Achieved    Target Date 02/13/21             PT Long Term Goals - 02/13/21 1215      PT LONG TERM GOAL #1   Title Patient will demonstrate independent use of home exercise program to facilitate ability to maintain/progress functional gains from skilled physical therapy services.    Time 10    Period Weeks    Status On-going    Target Date 04/03/21      PT LONG  TERM GOAL #2   Title Pt. will demonstrate bilateral hip MMT > or = 4/5 throughout to facilitate improved stabilty in ambulation, transfers.    Time 10    Period Weeks    Status On-going    Target Date 04/03/21      PT LONG TERM GOAL #3   Title Pt. will demonstrate BERG > 45 to indicated reduced fall risk.    Time 10    Period Weeks    Status On-going    Target Date 04/03/21      PT LONG TERM GOAL #4   Title Pt. will demonstrate TUG score < 14 seconds to facilitate reduced fall risk, improved stability in ambulation, performed c LRAD.    Time 10    Period Weeks    Status On-going    Target Date 04/03/21      PT LONG TERM GOAL #5   Title Pt. will demonstrate FOTO outcome > or = 56.    Time 10    Period Weeks    Status On-going    Target Date 04/03/21                 Plan - 02/20/21 1130    Clinical Impression Statement Fatigue noted even upon arrival today.  Adjusted to reduce standing activity and promote more active movement/strengthening overall to reduce strain and fatigue c WB standing activity.  Resume neuro re-ed balance focus next visit.    Personal Factors and Comorbidities Other   anxiety, fear of falling, Rt THA   Examination-Activity Limitations Squat;Bend;Bed Mobility;Stairs;Stand;Transfers;Lift;Locomotion Level    Examination-Participation Restrictions Community Activity;Yard Work;Laundry;Cleaning   community integration   Stability/Clinical Decision Making Evolving/Moderate complexity    Rehab Potential Fair    PT Frequency 2x / week    PT Duration Other (comment)   10 weeks   PT Treatment/Interventions ADLs/Self Care Home Management;Cryotherapy;Electrical Stimulation;Iontophoresis 4mg /ml Dexamethasone;Moist Heat;Balance training;Therapeutic exercise;Therapeutic activities;Functional mobility training;Stair training;Gait training;DME Instruction;Ultrasound;Patient/family education;Manual techniques;Dry needling;Passive range of motion;Spinal  Manipulations;Joint Manipulations    PT Next Visit Plan Continue intervention for improved ankle/hip strategy for balance control, compliant surfaces as appropriate.    PT Home Exercise Plan [id number]    Consulted and Agree with Plan of Care Patient           Patient will benefit from skilled therapeutic intervention in order to improve the following deficits and impairments:  Abnormal gait,Decreased endurance,Decreased strength,Decreased activity tolerance,Decreased balance,Decreased mobility,Difficulty  walking,Impaired perceived functional ability,Decreased coordination,Decreased safety awareness  Visit Diagnosis: Pain in right ankle and joints of right foot  Muscle weakness (generalized)  Difficulty in walking, not elsewhere classified     Problem List Patient Active Problem List   Diagnosis Date Noted  . Generalized anxiety disorder 01/13/2021  . Fall 04/30/2020  . Osteoarthritis, multiple sites 04/30/2020  . Pneumonia 03/11/2020  . Muscle spasm 03/10/2020  . Right flank pain 03/10/2020  . Right shoulder pain 02/08/2020  . Right hip pain 02/08/2020  . Blood loss anemia 02/08/2020  . 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 Retained orthopedic hardware   . Closed fracture of right proximal humerus 01/29/2020  . Closed right hip fracture, initial encounter ([place]) 06/22/2019  . Environmental and seasonal allergies 06/12/2018  . Dyslipidemia 08/26/2017  . IBS (irritable bowel syndrome) 08/26/2017  . Angio-edema 11/03/2016  . Diverticulosis of colon without hemorrhage 09/30/2015  . Depression, recurrent ([place]) 08/22/2007  . GERD 07/04/2007    Scot Jun, PT, DPT, OCS, ATC 02/20/21  11:43 AM    [facility] Physical Therapy [address] [place], [place], [address] Phone: [phone]   Fax:  [phone]  Name: Emily Horton MRN: [id number] Date of Birth: 06-22-1946

## 2021-02-25 ENCOUNTER — Encounter: Payer: Self-pay | Admitting: Rehabilitative and Restorative Service Providers"

## 2021-02-25 ENCOUNTER — Other Ambulatory Visit: Payer: Self-pay

## 2021-02-25 ENCOUNTER — Ambulatory Visit (INDEPENDENT_AMBULATORY_CARE_PROVIDER_SITE_OTHER): Payer: Medicare Other | Admitting: Rehabilitative and Restorative Service Providers"

## 2021-02-25 DIAGNOSIS — M25571 Pain in right ankle and joints of right foot: Secondary | ICD-10-CM | POA: Diagnosis not present

## 2021-02-25 DIAGNOSIS — R262 Difficulty in walking, not elsewhere classified: Secondary | ICD-10-CM | POA: Diagnosis not present

## 2021-02-25 DIAGNOSIS — M6281 Muscle weakness (generalized): Secondary | ICD-10-CM | POA: Diagnosis not present

## 2021-02-25 NOTE — Therapy (Signed)
[facility] [address] [place], [place], [address] Phone: [phone]   Fax:  [phone]  Physical Therapy Treatment  Patient Details  Name: Emily Horton MRN: [id number] Date of Birth: Apr 23, 1946 Referring Provider (PT): Dr. Ninfa Linden   Encounter Date: 02/25/2021   PT End of Session - 02/25/21 1117    Visit Number 8    Number of Visits 18    Date for PT Re-Evaluation 04/03/21    Authorization Type Medicare    Progress Note Due on Visit 10    PT Start Time 1100    PT Stop Time 1129    PT Time Calculation (min) 29 min    Equipment Utilized During Treatment --    Activity Tolerance Patient limited by pain    Behavior During Therapy [facility] for tasks assessed/performed           Past Medical History:  Diagnosis Date  . Colon polyps 09-25-2007   Colonoscopy  . Depression   . Diverticulosis of colon (without mention of hemorrhage) 09-25-2007   Colonoscopy  . GERD (gastroesophageal reflux disease)   . Gynecological examination    sees Dr. Carren Rang   . Hyperlipidemia     Past Surgical History:  Procedure Laterality Date  . COLONOSCOPY  12/30/2015   per Dr. Henrene Pastor, benign  polyps, repeat in 5  yrs.   . ESOPHAGOGASTRODUODENOSCOPY  06-30-12   per Dr. Sharlett Iles, reflux but no Barretts seen   . HARDWARE REMOVAL Right 02/01/2020   Procedure: HARDWARE REMOVAL;  Surgeon: Mcarthur Rossetti, MD;  Location: [facility];  Service: Orthopedics;  Laterality: Right;  . INTRAMEDULLARY (IM) NAIL INTERTROCHANTERIC Right 06/22/2019  . INTRAMEDULLARY (IM) NAIL INTERTROCHANTERIC Right 06/22/2019   Procedure: INTRAMEDULLARY (IM) NAIL INTERTROCHANTRIC;  Surgeon: Altamese Butler, MD;  Location: [place];  Service: Orthopedics;  Laterality: Right;  . REVERSE SHOULDER ARTHROPLASTY Right 01/29/2020   Procedure: RIGHT REVERSE SHOULDER ARTHROPLASTY;  Surgeon: Marchia Bond, MD;  [facility]: [facility];  Service: Orthopedics;  Laterality: Right;  . TOTAL HIP ARTHROPLASTY Right  02/01/2020   Procedure: TOTAL HIP ARTHROPLASTY ANTERIOR APPROACH;  Surgeon: Mcarthur Rossetti, MD;  [facility];  Service: Orthopedics;  Laterality: Right;    There were no vitals filed for this visit.   Subjective Assessment - 02/25/21 1114    Subjective Pt. indicated feeling her back was hurting today.  Pt. stated getting out of the car today seemed to cause pain.  No other pain to report.    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    Currently in Pain? Yes    Pain Score 7     Pain Location Back    Pain Orientation Lower    Pain Descriptors / Indicators Spasm    Pain Type Acute pain    Pain Onset Today    Pain Frequency Occasional    Aggravating Factors  getting out of car    Pain Relieving Factors unsure yet                             [facility] Adult PT Treatment/Exercise - 02/25/21 0001      Knee/Hip Exercises: Seated   Long Arc Quad Both   3 x 10   Long Arc Quad Weight 4 lbs.    Other Seated Knee/Hip Exercises seated slr 2 x  10 bilateral, eccentric lower focus    Marching 2 sets;10 reps;Both                    PT Short Term Goals - 02/11/21 1129      PT SHORT TERM GOAL #1   Title Patient will demonstrate independent use of home exercise program to maintain progress from in clinic treatments.    Time 3    Period Weeks    Status Achieved    Target Date 02/13/21             PT Long Term Goals - 02/13/21 1215      PT LONG TERM GOAL #1   Title Patient will demonstrate independent use of home exercise program to facilitate ability to maintain/progress functional gains from skilled physical therapy services.    Time 10    Period Weeks    Status On-going    Target Date 04/03/21      PT LONG TERM GOAL #2   Title Pt. will demonstrate bilateral hip MMT > or = 4/5 throughout to facilitate  improved stabilty in ambulation, transfers.    Time 10    Period Weeks    Status On-going    Target Date 04/03/21      PT LONG TERM GOAL #3   Title Pt. will demonstrate BERG > 45 to indicated reduced fall risk.    Time 10    Period Weeks    Status On-going    Target Date 04/03/21      PT LONG TERM GOAL #4   Title Pt. will demonstrate TUG score < 14 seconds to facilitate reduced fall risk, improved stability in ambulation, performed c LRAD.    Time 10    Period Weeks    Status On-going    Target Date 04/03/21      PT LONG TERM GOAL #5   Title Pt. will demonstrate FOTO outcome > or = 56.    Time 10    Period Weeks    Status On-going    Target Date 04/03/21                 Plan - 02/25/21 1123    Clinical Impression Statement Due to back complaints, primarily sitting related activity performed today to improve strength in LE.  Will plan to improve balance in standing activity on next visit.    Personal Factors and Comorbidities Other   anxiety, fear of falling, Rt THA   Examination-Activity Limitations Squat;Bend;Bed Mobility;Stairs;Stand;Transfers;Lift;Locomotion Level    Examination-Participation Restrictions Community Activity;Yard Work;Laundry;Cleaning   community integration   Stability/Clinical Decision Making Evolving/Moderate complexity    Rehab Potential Fair    PT Frequency 2x / week    PT Duration Other (comment)   10 weeks   PT Treatment/Interventions ADLs/Self Care Home Management;Cryotherapy;Electrical Stimulation;Iontophoresis 4mg /ml Dexamethasone;Moist Heat;Balance training;Therapeutic exercise;Therapeutic activities;Functional mobility training;Stair training;Gait training;DME Instruction;Ultrasound;Patient/family education;Manual techniques;Dry needling;Passive range of motion;Spinal Manipulations;Joint Manipulations    PT Next Visit Plan Return to intervention for improved ankle/hip strategy for balance control, compliant surfaces as appropriate.    PT  Home Exercise Plan [id number]    Consulted and Agree with Plan of Care Patient           Patient will benefit from skilled therapeutic intervention in order to improve the following deficits and impairments:  Abnormal gait,Decreased endurance,Decreased strength,Decreased activity tolerance,Decreased balance,Decreased mobility,Difficulty walking,Impaired perceived functional ability,Decreased coordination,Decreased safety awareness  Visit Diagnosis: Pain in right ankle and joints of right foot  Muscle weakness (generalized)  Difficulty in walking, not elsewhere classified     Problem List Patient Active Problem List   Diagnosis Date Noted  . Generalized anxiety disorder 01/13/2021  . Fall 04/30/2020  . Osteoarthritis, multiple sites 04/30/2020  . Pneumonia 03/11/2020  . Muscle spasm 03/10/2020  . Right flank pain 03/10/2020  . Right shoulder pain 02/08/2020  . Right hip pain 02/08/2020  . Blood loss anemia 02/08/2020  . 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 Retained orthopedic hardware   . Closed fracture of right proximal humerus 01/29/2020  . Closed right hip fracture, initial encounter ([place]) 06/22/2019  . Environmental and seasonal allergies 06/12/2018  . Dyslipidemia 08/26/2017  . IBS (irritable bowel syndrome) 08/26/2017  . Angio-edema 11/03/2016  . Diverticulosis of colon without hemorrhage 09/30/2015  . Depression, recurrent ([place]) 08/22/2007  . GERD 07/04/2007   Scot Jun, PT, DPT, OCS, ATC 02/25/21  11:33 AM    [facility] Physical Therapy [address] [place], [place], [address] Phone: [phone]   Fax:  [phone]  Name: Emily Horton MRN: [id number] Date of Birth: 09-Apr-1946

## 2021-02-26 ENCOUNTER — Ambulatory Visit: Payer: Medicare Other | Admitting: Psychologist

## 2021-02-27 ENCOUNTER — Other Ambulatory Visit: Payer: Self-pay

## 2021-02-27 ENCOUNTER — Encounter: Payer: Self-pay | Admitting: Rehabilitative and Restorative Service Providers"

## 2021-02-27 ENCOUNTER — Ambulatory Visit (INDEPENDENT_AMBULATORY_CARE_PROVIDER_SITE_OTHER): Payer: Medicare Other | Admitting: Rehabilitative and Restorative Service Providers"

## 2021-02-27 DIAGNOSIS — R262 Difficulty in walking, not elsewhere classified: Secondary | ICD-10-CM | POA: Diagnosis not present

## 2021-02-27 DIAGNOSIS — M25571 Pain in right ankle and joints of right foot: Secondary | ICD-10-CM

## 2021-02-27 DIAGNOSIS — M6281 Muscle weakness (generalized): Secondary | ICD-10-CM | POA: Diagnosis not present

## 2021-02-27 NOTE — Therapy (Signed)
[facility] Therapy [address] [place], [place], [address] Phone: [phone]   Fax:  [phone]  Physical Therapy Treatment  Patient Details  Name: Emily Horton MRN: [id number] Date of Birth: 08-Jan-1946 Referring Provider (PT): Dr. Ninfa Linden   Encounter Date: 02/27/2021   PT End of Session - 02/27/21 1102    Visit Number 9    Number of Visits 18    Date for PT Re-Evaluation 04/03/21    Authorization Type Medicare    Progress Note Due on Visit 10    PT Start Time 1055    PT Stop Time 1135    PT Time Calculation (min) 40 min    Equipment Utilized During Treatment Gait belt;Other (comment)   SPC   Activity Tolerance Patient limited by fatigue    Behavior During Therapy [facility] for tasks assessed/performed           Past Medical History:  Diagnosis Date  . Colon polyps 09-25-2007   Colonoscopy  . Depression   . Diverticulosis of colon (without mention of hemorrhage) 09-25-2007   Colonoscopy  . GERD (gastroesophageal reflux disease)   . Gynecological examination    sees Dr. Carren Rang   . Hyperlipidemia     Past Surgical History:  Procedure Laterality Date  . COLONOSCOPY  12/30/2015   per Dr. Henrene Pastor, benign  polyps, repeat in 5  yrs.   . ESOPHAGOGASTRODUODENOSCOPY  06-30-12   per Dr. Sharlett Iles, reflux but no Barretts seen   . HARDWARE REMOVAL Right 02/01/2020   Procedure: HARDWARE REMOVAL;  Surgeon: Mcarthur Rossetti, MD;  [facility];  Service: Orthopedics;  Laterality: Right;  . INTRAMEDULLARY (IM) NAIL INTERTROCHANTERIC Right 06/22/2019  . INTRAMEDULLARY (IM) NAIL INTERTROCHANTERIC Right 06/22/2019   Procedure: INTRAMEDULLARY (IM) NAIL INTERTROCHANTRIC;  Surgeon: Altamese Folsom, MD;  Location: [place];  Service: Orthopedics;  Laterality: Right;  . REVERSE SHOULDER ARTHROPLASTY Right 01/29/2020   Procedure: RIGHT REVERSE SHOULDER ARTHROPLASTY;  Surgeon: Marchia Bond, MD;  [facility];  Service: Orthopedics;  Laterality: Right;  .  TOTAL HIP ARTHROPLASTY Right 02/01/2020   Procedure: TOTAL HIP ARTHROPLASTY ANTERIOR APPROACH;  Surgeon: Mcarthur Rossetti, MD;  [facility];  Service: Orthopedics;  Laterality: Right;    There were no vitals filed for this visit.   Subjective Assessment - 02/27/21 1102    Subjective Pt. stated feeling less complaints and doing better with back.  Feeling better overall.    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    Currently in Pain? No/denies              [facility] PT Assessment - 02/27/21 0001      Observation/Other Assessments   Focus on Therapeutic Outcomes (FOTO)  update 49% (foot questionnaire but Pt. has balance deficits not specific to foot injury)                         [facility] Adult PT Treatment/Exercise - 02/27/21 0001      Therapeutic Activites    Other Therapeutic Activities sit to stand c eccentric focus x 10, 6 inch step up x 10 bilateral LE c SPC in Lt UE, cues for technique.  Performed to help improve functional tasks.      Neuro Re-ed    Neuro Re-ed Details  BIG inspired fwd step c CGA x 15 bilateral,  feet together stance incline/decline ramp static hold 1 min EC, 1 min EO each c CGA, alt toe tapping 6 inch step x 10 bilateral c CGA.  standing anterior weight shift strategy off wall 2 x 10      Knee/Hip Exercises: Aerobic   Nustep Lvl 6 15 mins                    PT Short Term Goals - 02/11/21 1129      PT SHORT TERM GOAL #1   Title Patient will demonstrate independent use of home exercise program to maintain progress from in clinic treatments.    Time 3    Period Weeks    Status Achieved    Target Date 02/13/21             PT Long Term Goals - 02/13/21 1215      PT LONG TERM GOAL #1   Title Patient will demonstrate independent use of home exercise program to facilitate ability  to maintain/progress functional gains from skilled physical therapy services.    Time 10    Period Weeks    Status On-going    Target Date 04/03/21      PT LONG TERM GOAL #2   Title Pt. will demonstrate bilateral hip MMT > or = 4/5 throughout to facilitate improved stabilty in ambulation, transfers.    Time 10    Period Weeks    Status On-going    Target Date 04/03/21      PT LONG TERM GOAL #3   Title Pt. will demonstrate BERG > 45 to indicated reduced fall risk.    Time 10    Period Weeks    Status On-going    Target Date 04/03/21      PT LONG TERM GOAL #4   Title Pt. will demonstrate TUG score < 14 seconds to facilitate reduced fall risk, improved stability in ambulation, performed c LRAD.    Time 10    Period Weeks    Status On-going    Target Date 04/03/21      PT LONG TERM GOAL #5   Title Pt. will demonstrate FOTO outcome > or = 56.    Time 10    Period Weeks    Status On-going    Target Date 04/03/21                 Plan - 02/27/21 1103    Clinical Impression Statement Whilte deficits are still noted in balance control, Pt. has demonstrated improvement in static balance holds compared to evaluation as well as improving stability in [facility] use.  Continued guarding SBA/CGA required at this time.  Pt. does still lack confidence in activity which creates barrier for progression of balance intervention due to anxiety and HHA reliance.    Personal Factors and Comorbidities Other   anxiety, fear of falling, Rt THA   Examination-Activity Limitations Squat;Bend;Bed Mobility;Stairs;Stand;Transfers;Lift;Locomotion Level    Examination-Participation Restrictions Community Activity;Yard Work;Laundry;Cleaning   community integration   Stability/Clinical Decision Making Evolving/Moderate complexity    Rehab Potential Fair    PT Frequency 2x / week    PT Duration Other (comment)   10 weeks   PT Treatment/Interventions ADLs/Self Care Home Management;Cryotherapy;Electrical  Stimulation;Iontophoresis 4mg /ml Dexamethasone;Moist Heat;Balance training;Therapeutic exercise;Therapeutic activities;Functional mobility training;Stair training;Gait training;DME Instruction;Ultrasound;Patient/family education;Manual techniques;Dry needling;Passive range of motion;Spinal Manipulations;Joint Manipulations    PT Next Visit Plan 10th visit progress note next visit (FOTO performed on today's visit)    PT Home  Exercise Plan [id number]    Consulted and Agree with Plan of Care Patient           Patient will benefit from skilled therapeutic intervention in order to improve the following deficits and impairments:  Abnormal gait,Decreased endurance,Decreased strength,Decreased activity tolerance,Decreased balance,Decreased mobility,Difficulty walking,Impaired perceived functional ability,Decreased coordination,Decreased safety awareness  Visit Diagnosis: Pain in right ankle and joints of right foot  Muscle weakness (generalized)  Difficulty in walking, not elsewhere classified     Problem List Patient Active Problem List   Diagnosis Date Noted  . Generalized anxiety disorder 01/13/2021  . Fall 04/30/2020  . Osteoarthritis, multiple sites 04/30/2020  . Pneumonia 03/11/2020  . Muscle spasm 03/10/2020  . Right flank pain 03/10/2020  . Right shoulder pain 02/08/2020  . Right hip pain 02/08/2020  . Blood loss anemia 02/08/2020  . 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 Retained orthopedic hardware   . Closed fracture of right proximal humerus 01/29/2020  . Closed right hip fracture, initial encounter ([place]) 06/22/2019  . Environmental and seasonal allergies 06/12/2018  . Dyslipidemia 08/26/2017  . IBS (irritable bowel syndrome) 08/26/2017  . Angio-edema 11/03/2016  . Diverticulosis of colon without hemorrhage 09/30/2015  . Depression, recurrent ([place]) 08/22/2007  . GERD 07/04/2007   Scot Jun, PT, DPT, OCS,  ATC 02/27/21  11:36 AM    [facility] Physical Therapy [address] [place], [place], [address] Phone: [phone]   Fax:  [phone]  Name: Emily Horton MRN: [id number] Date of Birth: 02/29/1946

## 2021-03-04 ENCOUNTER — Other Ambulatory Visit: Payer: Self-pay

## 2021-03-04 ENCOUNTER — Encounter: Payer: Self-pay | Admitting: Rehabilitative and Restorative Service Providers"

## 2021-03-04 ENCOUNTER — Ambulatory Visit (INDEPENDENT_AMBULATORY_CARE_PROVIDER_SITE_OTHER): Payer: Medicare Other | Admitting: Rehabilitative and Restorative Service Providers"

## 2021-03-04 DIAGNOSIS — R262 Difficulty in walking, not elsewhere classified: Secondary | ICD-10-CM

## 2021-03-04 DIAGNOSIS — M25571 Pain in right ankle and joints of right foot: Secondary | ICD-10-CM | POA: Diagnosis not present

## 2021-03-04 DIAGNOSIS — M6281 Muscle weakness (generalized): Secondary | ICD-10-CM

## 2021-03-04 NOTE — Therapy (Signed)
[place], [place], [address] Phone: [phone]   Fax:  [phone]  Physical Therapy Treatment/Progress Note  Patient Details  Name: Emily Horton MRN: [id number] Date of Birth: [birth year] Referring Provider (PT): Dr. Ninfa Linden   Encounter Date: 03/04/2021   Progress Note Reporting Period 01/23/2021 to 03/04/2021  See note below for Objective Data and Assessment of Progress/Goals.        PT End of Session - 03/04/21 1107    Visit Number 10    Number of Visits 18    Date for PT Re-Evaluation 04/03/21    Authorization Type Medicare    Progress Note Due on Visit 18    PT Start Time 1059    PT Stop Time 1138    PT Time Calculation (min) 39 min    Equipment Utilized During Treatment Gait belt;Other (comment)   SPC   Activity Tolerance Patient limited by fatigue    Behavior During Therapy [facility] for tasks assessed/performed           Past Medical History:  Diagnosis Date  . Colon polyps 09-25-2007   Colonoscopy  . Depression   . Diverticulosis of colon (without mention of hemorrhage) 09-25-2007   Colonoscopy  . GERD (gastroesophageal reflux disease)   . Gynecological examination    sees Dr. Carren Rang   . Hyperlipidemia     Past Surgical History:  Procedure Laterality Date  . COLONOSCOPY  12/30/2015   per Dr. Henrene Pastor, benign  polyps, repeat in 5  yrs.   . ESOPHAGOGASTRODUODENOSCOPY  06-30-12   per Dr. Sharlett Iles, reflux but no Barretts seen   . HARDWARE REMOVAL Right 02/01/2020   Procedure: HARDWARE REMOVAL;  Surgeon: Mcarthur Rossetti, MD;  Location: [facility];  Service: Orthopedics;  Laterality: Right;  . INTRAMEDULLARY (IM) NAIL INTERTROCHANTERIC Right 06/22/2019  . INTRAMEDULLARY (IM) NAIL INTERTROCHANTERIC Right 06/22/2019   Procedure: INTRAMEDULLARY (IM) NAIL INTERTROCHANTRIC;  Surgeon: Altamese Meadowbrook, MD;  Location: [place];  Service: Orthopedics;  Laterality: Right;  . REVERSE SHOULDER ARTHROPLASTY Right  01/29/2020   Procedure: RIGHT REVERSE SHOULDER ARTHROPLASTY;  Surgeon: Marchia Bond, MD;  Location: [facility];  Service: Orthopedics;  Laterality: Right;  . TOTAL HIP ARTHROPLASTY Right 02/01/2020   Procedure: TOTAL HIP ARTHROPLASTY ANTERIOR APPROACH;  Surgeon: Mcarthur Rossetti, MD;  Location: [facility];  Service: Orthopedics;  Laterality: Right;    There were no vitals filed for this visit.   Subjective Assessment - 03/04/21 1103    Subjective Pt. indicated no complaints today.  Arrived c SPC.  Pt. stated use of walker and cane while at home.    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    Currently in Pain? No/denies              [facility] PT Assessment - 03/04/21 0001      Assessment   Medical Diagnosis Rt ankle pain/foot pain, LE weakness    Referring Provider (PT) Dr. Ninfa Linden    Onset Date/Surgical Date 01/07/20      Observation/Other Assessments   Focus on Therapeutic Outcomes (FOTO)  update 49% (foot questionnaire but Pt. has balance deficits not specific to foot injury)      Strength   Right Hip Flexion 4+/5    Left Hip Flexion 5/5    Right Knee Flexion 5/5    Right Knee Extension 5/5  Left Knee Flexion 5/5    Left Knee Extension 5/5    Right Ankle Dorsiflexion 5/5    Left Ankle Dorsiflexion 5/5      Transfers   Five time sit to stand comments  12.32 seconds      Ambulation/Gait   Gait Comments SPC ambulation supervision in clinic < 300 ft      Berg Balance Test   Sit to Stand Able to stand without using hands and stabilize independently    Standing Unsupported Able to stand safely 2 minutes    Sitting with Back Unsupported but Feet Supported on Floor or Stool Able to sit safely and securely 2 minutes    Stand to Sit Sits safely with minimal use of hands    Transfers Able to transfer safely, minor use of hands    Standing  Unsupported with Eyes Closed Able to stand 10 seconds with supervision    Standing Unsupported with Feet Together Able to place feet together independently and stand for 1 minute with supervision    From Standing, Reach Forward with Outstretched Arm Can reach forward >12 cm safely (5")    From Standing Position, Pick up Object from Floor Able to pick up shoe, needs supervision    From Standing Position, Turn to Look Behind Over each Shoulder Looks behind from both sides and weight shifts well    Turn 360 Degrees Needs close supervision or verbal cueing    Standing Unsupported, Alternately Place Feet on Step/Stool Able to complete >2 steps/needs minimal assist    Standing Unsupported, One Foot in Front Able to take small step independently and hold 30 seconds    Standing on One Leg Unable to try or needs assist to prevent fall    Total Score 40      Timed Up and Go Test   Normal TUG (seconds) 16.14   SPC Lt UE                        [facility] Adult PT Treatment/Exercise - 03/04/21 0001      Neuro Re-ed    Neuro Re-ed Details  feet together stance EC 10 sec, modified tandem stance bilateral EO 1 min each, EC 30 sec each, alt toe tapping 6 inch step x 10 each LE, 360 turns each way x 1, anterior weight shift stategy off wall x 20, forward functional reach x 5 Rt UE, ramp navigation up/down x 4 each c SPC and CGA, lateral stepping c CGA/min A 15 ft x 5 each way      Knee/Hip Exercises: Aerobic   Nustep Lvl 6 12 mins      Knee/Hip Exercises: Seated   Sit to Sand without UE support;15 reps                    PT Short Term Goals - 02/11/21 1129      PT SHORT TERM GOAL #1   Title Patient will demonstrate independent use of home exercise program to maintain progress from in clinic treatments.    Time 3    Period Weeks    Status Achieved    Target Date 02/13/21             PT Long Term Goals - 03/04/21 1129      PT LONG TERM GOAL #1   Title Patient will  demonstrate independent use of home exercise program to facilitate ability to maintain/progress functional gains from skilled physical therapy  services.    Time 10    Period Weeks    Status On-going      PT LONG TERM GOAL #2   Title Pt. will demonstrate bilateral hip MMT > or = 4/5 throughout to facilitate improved stabilty in ambulation, transfers.    Baseline flexion noted above 4/5.  will plan for abd testing in future - 03/04/2021 update    Time 10    Period Weeks    Status Partially Met      PT LONG TERM GOAL #3   Title Pt. will demonstrate BERG > 45 to indicated reduced fall risk.    Time 10    Period Weeks    Status On-going    Target Date 04/03/21      PT LONG TERM GOAL #4   Title Pt. will demonstrate TUG score < 14 seconds to facilitate reduced fall risk, improved stability in ambulation, performed c LRAD.    Time 10    Period Weeks    Status On-going    Target Date 04/03/21      PT LONG TERM GOAL #5   Title Pt. will demonstrate FOTO outcome > or = 56.    Time 10    Period Weeks    Status On-going    Target Date 04/03/21                 Plan - 03/04/21 1104    Clinical Impression Statement Pt. has attended 10 visits overall at this time, no real pain complaints to report.  GROC reported at somewhat better +3. See objective data for updated information.  Pt. has demonstrated improvement in areas of balance control and ambulation independence but still showed at risk for falls.  Current ambulation SPC on level surfaces short distances <300 ft, FWW elsewise.  Continued skilled PT services indicated at this time.    Personal Factors and Comorbidities Other   anxiety, fear of falling, Rt THA   Examination-Activity Limitations Squat;Bend;Bed Mobility;Stairs;Stand;Transfers;Lift;Locomotion Level    Examination-Participation Restrictions Community Activity;Yard Work;Laundry;Cleaning   community integration   Stability/Clinical Decision Making Evolving/Moderate  complexity    Rehab Potential Fair    PT Frequency 2x / week    PT Duration Other (comment)   10 weeks   PT Treatment/Interventions ADLs/Self Care Home Management;Cryotherapy;Electrical Stimulation;Iontophoresis 63m/ml Dexamethasone;Moist Heat;Balance training;Therapeutic exercise;Therapeutic activities;Functional mobility training;Stair training;Gait training;DME Instruction;Ultrasound;Patient/family education;Manual techniques;Dry needling;Passive range of motion;Spinal Manipulations;Joint Manipulations    PT Next Visit Plan Continue compliant and non compliant static and dynamic balance intervention, Rt LE strengthening primarily    PT Home Exercise Plan [id number]   Consulted and Agree with Plan of Care Patient           Patient will benefit from skilled therapeutic intervention in order to improve the following deficits and impairments:  Abnormal gait,Decreased endurance,Decreased strength,Decreased activity tolerance,Decreased balance,Decreased mobility,Difficulty walking,Impaired perceived functional ability,Decreased coordination,Decreased safety awareness  Visit Diagnosis: Pain in right ankle and joints of right foot  Muscle weakness (generalized)  Difficulty in walking, not elsewhere classified     Problem List Patient Active Problem List   Diagnosis Date Noted  . Generalized anxiety disorder 01/13/2021  . Fall 04/30/2020  . Osteoarthritis, multiple sites 04/30/2020  . Pneumonia 03/11/2020  . Muscle spasm 03/10/2020  . Right flank pain 03/10/2020  . Right shoulder pain 02/08/2020  . Right hip pain 02/08/2020  . Blood loss anemia 02/08/2020  . 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  Retained orthopedic hardware   . Closed fracture of right proximal humerus 01/29/2020  . Closed right hip fracture, initial encounter ([place]) 06/22/2019  . Environmental and seasonal allergies 06/12/2018  . Dyslipidemia 08/26/2017  .  IBS (irritable bowel syndrome) 08/26/2017  . Angio-edema 11/03/2016  . Diverticulosis of colon without hemorrhage 09/30/2015  . Depression, recurrent ([place]) 08/22/2007  . GERD 07/04/2007    Scot Jun, PT, DPT, OCS, ATC 03/04/21  11:38 AM    [facility] Physical Therapy [address] [place], [place], [address] Phone: [phone]   Fax:  [phone]  Name: KORINE WINTON MRN: [id number] Date of Birth: 1945/12/29

## 2021-03-06 ENCOUNTER — Encounter: Payer: Self-pay | Admitting: Rehabilitative and Restorative Service Providers"

## 2021-03-06 ENCOUNTER — Other Ambulatory Visit: Payer: Self-pay

## 2021-03-06 ENCOUNTER — Ambulatory Visit (INDEPENDENT_AMBULATORY_CARE_PROVIDER_SITE_OTHER): Payer: Medicare Other | Admitting: Rehabilitative and Restorative Service Providers"

## 2021-03-06 DIAGNOSIS — M6281 Muscle weakness (generalized): Secondary | ICD-10-CM | POA: Diagnosis not present

## 2021-03-06 DIAGNOSIS — M25571 Pain in right ankle and joints of right foot: Secondary | ICD-10-CM | POA: Diagnosis not present

## 2021-03-06 DIAGNOSIS — R262 Difficulty in walking, not elsewhere classified: Secondary | ICD-10-CM | POA: Diagnosis not present

## 2021-03-06 NOTE — Therapy (Signed)
[facility] Physical Therapy [address] [place], [place], [address] Phone: [phone]   Fax:  [phone]  Physical Therapy Treatment  Patient Details  Name: Emily Horton MRN: [id number] Date of Birth: 1946-06-05 Referring Provider (PT): Dr. Ninfa Linden   Encounter Date: 03/06/2021   PT End of Session - 03/06/21 1108    Visit Number 11    Number of Visits 18    Date for PT Re-Evaluation 04/03/21    Authorization Type Medicare    Progress Note Due on Visit 18    PT Start Time 1100    PT Stop Time 1140    PT Time Calculation (min) 40 min    Equipment Utilized During Treatment Gait belt;Other (comment)   SPC   Activity Tolerance Patient limited by fatigue    Behavior During Therapy [facility] for tasks assessed/performed           Past Medical History:  Diagnosis Date  . Colon polyps 09-25-2007   Colonoscopy  . Depression   . Diverticulosis of colon (without mention of hemorrhage) 09-25-2007   Colonoscopy  . GERD (gastroesophageal reflux disease)   . Gynecological examination    sees Dr. Carren Rang   . Hyperlipidemia     Past Surgical History:  Procedure Laterality Date  . COLONOSCOPY  12/30/2015   per Dr. Henrene Pastor, benign  polyps, repeat in 5  yrs.   . ESOPHAGOGASTRODUODENOSCOPY  06-30-12   per Dr. Sharlett Iles, reflux but no Barretts seen   . HARDWARE REMOVAL Right 02/01/2020   Procedure: HARDWARE REMOVAL;  Surgeon: Mcarthur Rossetti, MD;  Location: [facility];  Service: Orthopedics;  Laterality: Right;  . INTRAMEDULLARY (IM) NAIL INTERTROCHANTERIC Right 06/22/2019  . INTRAMEDULLARY (IM) NAIL INTERTROCHANTERIC Right 06/22/2019   Procedure: INTRAMEDULLARY (IM) NAIL INTERTROCHANTRIC;  Surgeon: Altamese , MD;  Location: [place];  Service: Orthopedics;  Laterality: Right;  . REVERSE SHOULDER ARTHROPLASTY Right 01/29/2020   Procedure: RIGHT REVERSE SHOULDER ARTHROPLASTY;  Surgeon: Marchia Bond, MD;  Location: [facility];  Service: Orthopedics;  Laterality: Right;  .  TOTAL HIP ARTHROPLASTY Right 02/01/2020   Procedure: TOTAL HIP ARTHROPLASTY ANTERIOR APPROACH;  Surgeon: Mcarthur Rossetti, MD;  Location: [facility];  Service: Orthopedics;  Laterality: Right;    There were no vitals filed for this visit.   Subjective Assessment - 03/06/21 1109    Subjective Pt. continued ambulation to clinic c SPC.  No specific complaints indicated today upon arrival related to pain.    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    Currently in Pain? No/denies                             [facility] Adult PT Treatment/Exercise - 03/06/21 0001      Neuro Re-ed    Neuro Re-ed Details  modified tandem ambulation fwd 20 ft x 6, reverse ambulation c cues for increased step length 20 ft x 6, side stepping 20 ft x 5 each way   CGA to min A at times for all neuro re-ed     Knee/Hip Exercises: Aerobic   Nustep Lvl 612 mins      Knee/Hip Exercises: Machines for Strengthening   Total Gym Leg Press DL 75 lbs 25 x , SL 37 lbs 2 x 15 bilateral      Knee/Hip Exercises: Seated   Other  Seated Knee/Hip Exercises seated marching 20 x bilateral, SLR x 10 bilateral                    PT Short Term Goals - 02/11/21 1129      PT SHORT TERM GOAL #1   Title Patient will demonstrate independent use of home exercise program to maintain progress from in clinic treatments.    Time 3    Period Weeks    Status Achieved    Target Date 02/13/21             PT Long Term Goals - 03/04/21 1129      PT LONG TERM GOAL #1   Title Patient will demonstrate independent use of home exercise program to facilitate ability to maintain/progress functional gains from skilled physical therapy services.    Time 10    Period Weeks    Status On-going      PT LONG TERM GOAL #2   Title Pt. will demonstrate bilateral hip MMT > or = 4/5  throughout to facilitate improved stabilty in ambulation, transfers.    Baseline flexion noted above 4/5.  will plan for abd testing in future - 03/04/2021 update    Time 10    Period Weeks    Status Partially Met      PT LONG TERM GOAL #3   Title Pt. will demonstrate BERG > 45 to indicated reduced fall risk.    Time 10    Period Weeks    Status On-going    Target Date 04/03/21      PT LONG TERM GOAL #4   Title Pt. will demonstrate TUG score < 14 seconds to facilitate reduced fall risk, improved stability in ambulation, performed c LRAD.    Time 10    Period Weeks    Status On-going    Target Date 04/03/21      PT LONG TERM GOAL #5   Title Pt. will demonstrate FOTO outcome > or = 56.    Time 10    Period Weeks    Status On-going    Target Date 04/03/21                 Plan - 03/06/21 1128    Clinical Impression Statement Consistent cues required on neuro re-ed balance intervention to reduce HHA and narrow base of support to challenege balance control.  Instances of loss of balance successfully corrected c stepping today vs. previous occurences on past visits that required min A.    Personal Factors and Comorbidities Other   anxiety, fear of falling, Rt THA   Examination-Activity Limitations Squat;Bend;Bed Mobility;Stairs;Stand;Transfers;Lift;Locomotion Level    Examination-Participation Restrictions Community Activity;Yard Work;Laundry;Cleaning   community integration   Stability/Clinical Decision Making Evolving/Moderate complexity    Rehab Potential Fair    PT Frequency 2x / week    PT Duration Other (comment)   10 weeks   PT Treatment/Interventions ADLs/Self Care Home Management;Cryotherapy;Electrical Stimulation;Iontophoresis 74m/ml Dexamethasone;Moist Heat;Balance training;Therapeutic exercise;Therapeutic activities;Functional mobility training;Stair training;Gait training;DME Instruction;Ultrasound;Patient/family education;Manual techniques;Dry needling;Passive range  of motion;Spinal Manipulations;Joint Manipulations    PT Next Visit Plan Continue compliant and non compliant static and dynamic balance intervention, Rt LE strengthening primarily    PT Home Exercise Plan [id number]   Consulted and Agree with Plan of Care Patient           Patient will benefit from skilled therapeutic intervention in order to improve the following deficits and impairments:  Abnormal gait,Decreased endurance,Decreased strength,Decreased activity  tolerance,Decreased balance,Decreased mobility,Difficulty walking,Impaired perceived functional ability,Decreased coordination,Decreased safety awareness  Visit Diagnosis: Pain in right ankle and joints of right foot  Muscle weakness (generalized)  Difficulty in walking, not elsewhere classified     Problem List Patient Active Problem List   Diagnosis Date Noted  . Generalized anxiety disorder 01/13/2021  . Fall 04/30/2020  . Osteoarthritis, multiple sites 04/30/2020  . Pneumonia 03/11/2020  . Muscle spasm 03/10/2020  . Right flank pain 03/10/2020  . Right shoulder pain 02/08/2020  . Right hip pain 02/08/2020  . Blood loss anemia 02/08/2020  . 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 Retained orthopedic hardware   . Closed fracture of right proximal humerus 01/29/2020  . Closed right hip fracture, initial encounter ([place]) 06/22/2019  . Environmental and seasonal allergies 06/12/2018  . Dyslipidemia 08/26/2017  . IBS (irritable bowel syndrome) 08/26/2017  . Angio-edema 11/03/2016  . Diverticulosis of colon without hemorrhage 09/30/2015  . Depression, recurrent ([place]) 08/22/2007  . GERD 07/04/2007   Scot Jun, PT, DPT, OCS, ATC 03/06/21  11:38 AM    [facility] Physical Therapy [address] [place], [place], [address] Phone: [phone]   Fax:  [phone]  Name: Emily Horton MRN: [id number] Date of Birth: Jan 27, 1946

## 2021-03-11 ENCOUNTER — Ambulatory Visit (INDEPENDENT_AMBULATORY_CARE_PROVIDER_SITE_OTHER): Payer: Medicare Other | Admitting: Rehabilitative and Restorative Service Providers"

## 2021-03-11 ENCOUNTER — Other Ambulatory Visit: Payer: Self-pay

## 2021-03-11 ENCOUNTER — Encounter: Payer: Self-pay | Admitting: Rehabilitative and Restorative Service Providers"

## 2021-03-11 DIAGNOSIS — M25571 Pain in right ankle and joints of right foot: Secondary | ICD-10-CM

## 2021-03-11 DIAGNOSIS — R262 Difficulty in walking, not elsewhere classified: Secondary | ICD-10-CM | POA: Diagnosis not present

## 2021-03-11 DIAGNOSIS — M6281 Muscle weakness (generalized): Secondary | ICD-10-CM

## 2021-03-11 NOTE — Therapy (Signed)
[facility] [address] [place], [place], [address] Phone: [phone]   Fax:  [phone]  Physical Therapy Treatment  Patient Details  Name: Emily Horton MRN: [id number] Date of Birth: 03/08/1946 Referring Provider (PT): Dr. Blackman   Encounter Date: 03/11/2021   PT End of Session - 03/11/21 1108    Visit Number 12    Number of Visits 18    Date for PT Re-Evaluation 04/03/21    Authorization Type Medicare    Progress Note Due on Visit 18    PT Start Time 1059    PT Stop Time 1140    PT Time Calculation (min) 41 min    Equipment Utilized During Treatment Gait belt;Other (comment)   SPC   Activity Tolerance Patient limited by fatigue    Behavior During Therapy WFL for tasks assessed/performed           Past Medical History:  Diagnosis Date  . Colon polyps 09-25-2007   Colonoscopy  . Depression   . Diverticulosis of colon (without mention of hemorrhage) 09-25-2007   Colonoscopy  . GERD (gastroesophageal reflux disease)   . Gynecological examination    sees Dr. Mezer   . Hyperlipidemia     Past Surgical History:  Procedure Laterality Date  . COLONOSCOPY  12/30/2015   per Dr. Perry, benign  polyps, repeat in 5  yrs.   . ESOPHAGOGASTRODUODENOSCOPY  06-30-12   per Dr. Patterson, reflux but no Barretts seen   . HARDWARE REMOVAL Right 02/01/2020   Procedure: HARDWARE REMOVAL;  Surgeon: Blackman, Christopher Y, MD;  Location: [facility];  Service: Orthopedics;  Laterality: Right;  . INTRAMEDULLARY (IM) NAIL INTERTROCHANTERIC Right 06/22/2019  . INTRAMEDULLARY (IM) NAIL INTERTROCHANTERIC Right 06/22/2019   Procedure: INTRAMEDULLARY (IM) NAIL INTERTROCHANTRIC;  Surgeon: Handy, Michael, MD;  [facility];  Service: Orthopedics;  Laterality: Right;  . REVERSE SHOULDER ARTHROPLASTY Right 01/29/2020   Procedure: RIGHT REVERSE SHOULDER ARTHROPLASTY;  Surgeon: Landau, Joshua, MD;  [facility]: [facility];  Service: Orthopedics;  Laterality: Right;  .  TOTAL HIP ARTHROPLASTY Right 02/01/2020   Procedure: TOTAL HIP ARTHROPLASTY ANTERIOR APPROACH;  Surgeon: Blackman, Christopher Y, MD;  [facility];  Service: Orthopedics;  Laterality: Right;    There were no vitals filed for this visit.   Subjective Assessment - 03/11/21 1104    Subjective Pt. indicated feeling more off balance today.  Indicated she didn't want her friend to have to get the walker out of the car.    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    Currently in Pain? No/denies                             [facility] Adult PT Treatment/Exercise - 03/11/21 0001      Therapeutic Activites    Other Therapeutic Activities sit to stand c eccentric focus no UE 2 x 10, 6 inch step up and over x 10 each LE one HHA, SPC use in clinic between exercises (20-50 ft several times throughout visit c SBA)      Neuro Re-ed    Neuro Re-ed Details  fwd step over cane 20x bilateral c CGA/min A at times, posterior stepping and anterior stepping response to loss of support x 20 each way      Knee/Hip Exercises: Aerobic   Nustep Lvl   6 15 mins      Knee/Hip Exercises: Seated   Other Seated Knee/Hip Exercises seated SLR 2 x 10 bilateral                    PT Short Term Goals - 02/11/21 1129      PT SHORT TERM GOAL #1   Title Patient will demonstrate independent use of home exercise program to maintain progress from in clinic treatments.    Time 3    Period Weeks    Status Achieved    Target Date 02/13/21             PT Long Term Goals - 03/04/21 1129      PT LONG TERM GOAL #1   Title Patient will demonstrate independent use of home exercise program to facilitate ability to maintain/progress functional gains from skilled physical therapy services.    Time 10    Period Weeks    Status On-going      PT LONG TERM GOAL #2    Title Pt. will demonstrate bilateral hip MMT > or = 4/5 throughout to facilitate improved stabilty in ambulation, transfers.    Baseline flexion noted above 4/5.  will plan for abd testing in future - 03/04/2021 update    Time 10    Period Weeks    Status Partially Met      PT LONG TERM GOAL #3   Title Pt. will demonstrate BERG > 45 to indicated reduced fall risk.    Time 10    Period Weeks    Status On-going    Target Date 04/03/21      PT LONG TERM GOAL #4   Title Pt. will demonstrate TUG score < 14 seconds to facilitate reduced fall risk, improved stability in ambulation, performed c LRAD.    Time 10    Period Weeks    Status On-going    Target Date 04/03/21      PT LONG TERM GOAL #5   Title Pt. will demonstrate FOTO outcome > or = 56.    Time 10    Period Weeks    Status On-going    Target Date 04/03/21                 Plan - 03/11/21 1140    Clinical Impression Statement More difficulty noted in anterior stepping response than posterior stepping response (both can improve).  Continued anxiety driven resistance to progressive balance intervention (compliant surfaces, reduced HHA availability).   Visible change c increased widened base of support in ambulation c SPC today c increased wall touching upon arrival.    Personal Factors and Comorbidities Other   anxiety, fear of falling, Rt THA   Examination-Activity Limitations Squat;Bend;Bed Mobility;Stairs;Stand;Transfers;Lift;Locomotion Level    Examination-Participation Restrictions Community Activity;Yard Work;Laundry;Cleaning   community integration   Stability/Clinical Decision Making Evolving/Moderate complexity    Rehab Potential Fair    PT Frequency 2x / week    PT Duration Other (comment)   10 weeks   PT Treatment/Interventions ADLs/Self Care Home Management;Cryotherapy;Electrical Stimulation;Iontophoresis 76m/ml Dexamethasone;Moist Heat;Balance training;Therapeutic exercise;Therapeutic activities;Functional  mobility training;Stair training;Gait training;DME Instruction;Ultrasound;Patient/family education;Manual techniques;Dry needling;Passive range of motion;Spinal Manipulations;Joint Manipulations    PT Next Visit Plan Continue compliant and non compliant static and dynamic balance intervention, Rt LE strengthening primarily    PT Home Exercise Plan [id number]   Consulted and Agree with Plan of Care Patient           Patient will benefit  from skilled therapeutic intervention in order to improve the following deficits and impairments:  Abnormal gait,Decreased endurance,Decreased strength,Decreased activity tolerance,Decreased balance,Decreased mobility,Difficulty walking,Impaired perceived functional ability,Decreased coordination,Decreased safety awareness  Visit Diagnosis: Pain in right ankle and joints of right foot  Muscle weakness (generalized)  Difficulty in walking, not elsewhere classified     Problem List Patient Active Problem List   Diagnosis Date Noted  . Generalized anxiety disorder 01/13/2021  . Fall 04/30/2020  . Osteoarthritis, multiple sites 04/30/2020  . Pneumonia 03/11/2020  . Muscle spasm 03/10/2020  . Right flank pain 03/10/2020  . Right shoulder pain 02/08/2020  . Right hip pain 02/08/2020  . Blood loss anemia 02/08/2020  . 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 Retained orthopedic hardware   . Closed fracture of right proximal humerus 01/29/2020  . Closed right hip fracture, initial encounter (HCC) 06/22/2019  . Environmental and seasonal allergies 06/12/2018  . Dyslipidemia 08/26/2017  . IBS (irritable bowel syndrome) 08/26/2017  . Angio-edema 11/03/2016  . Diverticulosis of colon without hemorrhage 09/30/2015  . Depression, recurrent (HCC) 08/22/2007  . GERD 07/04/2007    Michael Wright, PT, DPT, OCS, ATC 03/11/21  11:44 AM    [facility] [address] , [place], [address] Phone: [phone]   Fax:  [phone]  Name: Emily Horton MRN: [id number] Date of Birth: 03/12/1946   

## 2021-03-13 ENCOUNTER — Ambulatory Visit (INDEPENDENT_AMBULATORY_CARE_PROVIDER_SITE_OTHER): Payer: Medicare Other | Admitting: Rehabilitative and Restorative Service Providers"

## 2021-03-13 ENCOUNTER — Other Ambulatory Visit: Payer: Self-pay

## 2021-03-13 ENCOUNTER — Encounter: Payer: Self-pay | Admitting: Rehabilitative and Restorative Service Providers"

## 2021-03-13 DIAGNOSIS — M6281 Muscle weakness (generalized): Secondary | ICD-10-CM

## 2021-03-13 DIAGNOSIS — R262 Difficulty in walking, not elsewhere classified: Secondary | ICD-10-CM | POA: Diagnosis not present

## 2021-03-13 DIAGNOSIS — M25571 Pain in right ankle and joints of right foot: Secondary | ICD-10-CM | POA: Diagnosis not present

## 2021-03-13 NOTE — Therapy (Signed)
[facility] [address] [place], [place], [address] Phone: [phone]   Fax:  [phone]  Physical Therapy Treatment  Patient Details  Name: Emily Horton MRN: [id number] Date of Birth: [birth year] Referring Provider (PT): Dr. Ninfa Linden   Encounter Date: 03/13/2021   PT End of Session - 03/13/21 1111    Visit Number 13    Number of Visits 18    Date for PT Re-Evaluation 04/03/21    Authorization Type Medicare    Progress Note Due on Visit 18    PT Start Time 1100    PT Stop Time 1140    PT Time Calculation (min) 40 min    Equipment Utilized During Treatment Gait belt;Other (comment)   SPC   Activity Tolerance Patient limited by fatigue    Behavior During Therapy [facility] for tasks assessed/performed           Past Medical History:  Diagnosis Date  . Colon polyps 09-25-2007   Colonoscopy  . Depression   . Diverticulosis of colon (without mention of hemorrhage) 09-25-2007   Colonoscopy  . GERD (gastroesophageal reflux disease)   . Gynecological examination    sees Dr. Carren Rang   . Hyperlipidemia     Past Surgical History:  Procedure Laterality Date  . COLONOSCOPY  12/30/2015   per Dr. Henrene Pastor, benign  polyps, repeat in 5  yrs.   . ESOPHAGOGASTRODUODENOSCOPY  06-30-12   per Dr. Sharlett Iles, reflux but no Barretts seen   . HARDWARE REMOVAL Right 02/01/2020   Procedure: HARDWARE REMOVAL;  Surgeon: Mcarthur Rossetti, MD;  [facility]: [facility];  Service: Orthopedics;  Laterality: Right;  . INTRAMEDULLARY (IM) NAIL INTERTROCHANTERIC Right 06/22/2019  . INTRAMEDULLARY (IM) NAIL INTERTROCHANTERIC Right 06/22/2019   Procedure: INTRAMEDULLARY (IM) NAIL INTERTROCHANTRIC;  Surgeon: Altamese El Portal, MD;  Location: [place];  Service: Orthopedics;  Laterality: Right;  . REVERSE SHOULDER ARTHROPLASTY Right 01/29/2020   Procedure: RIGHT REVERSE SHOULDER ARTHROPLASTY;  Surgeon: Marchia Bond, MD;  [facility];  Service: Orthopedics;  Laterality: Right;  .  TOTAL HIP ARTHROPLASTY Right 02/01/2020   Procedure: TOTAL HIP ARTHROPLASTY ANTERIOR APPROACH;  Surgeon: Mcarthur Rossetti, MD;  [facility];  Service: Orthopedics;  Laterality: Right;    There were no vitals filed for this visit.   Subjective Assessment - 03/13/21 1110    Subjective She had no complaints of pain today.  Reporting "feeling good" but did use the walker to come into the building today    Pertinent History Rt THA 02/05/20    Limitations Standing;Walking;House hold activities    Patient Stated Goals Wants to be "normal", go shopping, walk independent and reduce falls.    Currently in Pain? No/denies              [facility] PT Assessment - 03/13/21 0001      Assessment   Medical Diagnosis Rt ankle pain/foot pain, LE weakness    Referring Provider (PT) Dr. Ninfa Linden    Onset Date/Surgical Date 01/07/20      Transfers   Five time sit to stand comments  13 seconds                         [facility] Adult PT Treatment/Exercise - 03/13/21 0001      Neuro Re-ed    Neuro Re-ed Details  obstacle course (4 inch step up/down, modified tandem ambulation, step over half foam roller x2, airex foam pad step up/down, one LE step 2 inch uneven  approx. 15 ft long) c CGA/min A c SPC use x 6 times.  standing feet hip width apart on foam EO 2 mins c occasional Min A to prevent LoB.      Knee/Hip Exercises: Aerobic   Nustep Lvl 6 15 mins      Knee/Hip Exercises: Standing   Forward Step Up Step Height: 4";15 reps;Both      Knee/Hip Exercises: Seated   Long Arc Quad 10 reps;Both    Sit to Sand without UE support   2 x 10                   PT Short Term Goals - 02/11/21 1129      PT SHORT TERM GOAL #1   Title Patient will demonstrate independent use of home exercise program to maintain progress from in clinic treatments.    Time 3    Period Weeks    Status Achieved    Target Date 02/13/21             PT Long Term Goals - 03/04/21 1129      PT  LONG TERM GOAL #1   Title Patient will demonstrate independent use of home exercise program to facilitate ability to maintain/progress functional gains from skilled physical therapy services.    Time 10    Period Weeks    Status On-going      PT LONG TERM GOAL #2   Title Pt. will demonstrate bilateral hip MMT > or = 4/5 throughout to facilitate improved stabilty in ambulation, transfers.    Baseline flexion noted above 4/5.  will plan for abd testing in future - 03/04/2021 update    Time 10    Period Weeks    Status Partially Met      PT LONG TERM GOAL #3   Title Pt. will demonstrate BERG > 45 to indicated reduced fall risk.    Time 10    Period Weeks    Status On-going    Target Date 04/03/21      PT LONG TERM GOAL #4   Title Pt. will demonstrate TUG score < 14 seconds to facilitate reduced fall risk, improved stability in ambulation, performed c LRAD.    Time 10    Period Weeks    Status On-going    Target Date 04/03/21      PT LONG TERM GOAL #5   Title Pt. will demonstrate FOTO outcome > or = 56.    Time 10    Period Weeks    Status On-going    Target Date 04/03/21                 Plan - 03/13/21 1139    Clinical Impression Statement Pt. reported driving and was able to get out of car, get walker and go in store and return and hasn't done that in a while.  Contiued education on variable use of SPC and FWW based off that day's comfort level. New challenge on balance obstacle course had occasions of loss of balance but Pt. demonstrated fair performance overall.    Personal Factors and Comorbidities Other   anxiety, fear of falling, Rt THA   Examination-Activity Limitations Squat;Bend;Bed Mobility;Stairs;Stand;Transfers;Lift;Locomotion Level    Examination-Participation Restrictions Community Activity;Yard Work;Laundry;Cleaning   community integration   Stability/Clinical Decision Making Evolving/Moderate complexity    Rehab Potential Fair    PT Frequency 2x / week     PT Duration Other (comment)   10 weeks  PT Treatment/Interventions ADLs/Self Care Home Management;Cryotherapy;Electrical Stimulation;Iontophoresis 8m/ml Dexamethasone;Moist Heat;Balance training;Therapeutic exercise;Therapeutic activities;Functional mobility training;Stair training;Gait training;DME Instruction;Ultrasound;Patient/family education;Manual techniques;Dry needling;Passive range of motion;Spinal Manipulations;Joint Manipulations    PT Next Visit Plan Continue compliant and non compliant static and dynamic balance intervention, Rt LE strengthening primarily    PT Home Exercise Plan [id number]   Consulted and Agree with Plan of Care Patient           Patient will benefit from skilled therapeutic intervention in order to improve the following deficits and impairments:  Abnormal gait,Decreased endurance,Decreased strength,Decreased activity tolerance,Decreased balance,Decreased mobility,Difficulty walking,Impaired perceived functional ability,Decreased coordination,Decreased safety awareness  Visit Diagnosis: Pain in right ankle and joints of right foot  Muscle weakness (generalized)  Difficulty in walking, not elsewhere classified     Problem List Patient Active Problem List   Diagnosis Date Noted  . Generalized anxiety disorder 01/13/2021  . Fall 04/30/2020  . Osteoarthritis, multiple sites 04/30/2020  . Pneumonia 03/11/2020  . Muscle spasm 03/10/2020  . Right flank pain 03/10/2020  . Right shoulder pain 02/08/2020  . Right hip pain 02/08/2020  . Blood loss anemia 02/08/2020  . 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 Retained orthopedic hardware   . Closed fracture of right proximal humerus 01/29/2020  . Closed right hip fracture, initial encounter ([place] 06/22/2019  . Environmental and seasonal allergies 06/12/2018  . Dyslipidemia 08/26/2017  . IBS (irritable bowel syndrome) 08/26/2017  . Angio-edema  11/03/2016  . Diverticulosis of colon without hemorrhage 09/30/2015  . [place] 08/[place]  . GERD 07/04/2007    MScot Jun PT, DPT, OCS, ATC 03/13/21  11:42 AM    [facility] [address] [address]Phone: [phone]  Fax:  [phone] Name: Emily Horton: [id number] of Birth: [birth year]

## 2021-03-18 ENCOUNTER — Ambulatory Visit (INDEPENDENT_AMBULATORY_CARE_PROVIDER_SITE_OTHER): Payer: Medicare Other | Admitting: Rehabilitative and Restorative Service Providers"

## 2021-03-18 ENCOUNTER — Encounter: Payer: Self-pay | Admitting: Rehabilitative and Restorative Service Providers"

## 2021-03-18 ENCOUNTER — Other Ambulatory Visit: Payer: Self-pay

## 2021-03-18 DIAGNOSIS — R262 Difficulty in walking, not elsewhere classified: Secondary | ICD-10-CM

## 2021-03-18 DIAGNOSIS — M6281 Muscle weakness (generalized): Secondary | ICD-10-CM | POA: Diagnosis not present

## 2021-03-18 DIAGNOSIS — M25571 Pain in right ankle and joints of right foot: Secondary | ICD-10-CM | POA: Diagnosis not present

## 2021-03-18 NOTE — Therapy (Signed)
[facility] [address] [place], [place], [address] Phone: [phone]   Fax:  [phone]  Physical Therapy Treatment  Patient Details  Name: Emily Horton MRN: [id number] Date of Birth: 20-Feb-1946 Referring Provider (PT): Dr. Ninfa Linden   Encounter Date: 03/18/2021   PT End of Session - 03/18/21 1112    Visit Number 14    Number of Visits 18    Date for PT Re-Evaluation 04/03/21    Authorization Type Medicare    Progress Note Due on Visit 18    PT Start Time 1059    PT Stop Time 1139    PT Time Calculation (min) 40 min    Equipment Utilized During Treatment Gait belt;Other (comment)   SPC   Activity Tolerance Patient limited by fatigue    Behavior During Therapy [facility] for tasks assessed/performed           Past Medical History:  Diagnosis Date  . Colon polyps 09-25-2007   Colonoscopy  . Depression   . Diverticulosis of colon (without mention of hemorrhage) 09-25-2007   Colonoscopy  . GERD (gastroesophageal reflux disease)   . Gynecological examination    sees Dr. Carren Rang   . Hyperlipidemia     Past Surgical History:  Procedure Laterality Date  . COLONOSCOPY  12/30/2015   per Dr. Henrene Pastor, benign  polyps, repeat in 5  yrs.   . ESOPHAGOGASTRODUODENOSCOPY  06-30-12   per Dr. Sharlett Iles, reflux but no Barretts seen   . HARDWARE REMOVAL Right 02/01/2020   Procedure: HARDWARE REMOVAL;  Surgeon: Mcarthur Rossetti, MD;  [facility]: [facility];  Service: Orthopedics;  Laterality: Right;  . INTRAMEDULLARY (IM) NAIL INTERTROCHANTERIC Right 06/22/2019  . INTRAMEDULLARY (IM) NAIL INTERTROCHANTERIC Right 06/22/2019   Procedure: INTRAMEDULLARY (IM) NAIL INTERTROCHANTRIC;  Surgeon: Altamese Redlands, MD;  Location: [place];  Service: Orthopedics;  Laterality: Right;  . REVERSE SHOULDER ARTHROPLASTY Right 01/29/2020   Procedure: RIGHT REVERSE SHOULDER ARTHROPLASTY;  Surgeon: Marchia Bond, MD;  [facility]: [facility];  Service: Orthopedics;  Laterality: Right;  .  TOTAL HIP ARTHROPLASTY Right 02/01/2020   Procedure: TOTAL HIP ARTHROPLASTY ANTERIOR APPROACH;  Surgeon: Mcarthur Rossetti, MD;  [facility];  Service: Orthopedics;  Laterality: Right;    There were no vitals filed for this visit.   Subjective Assessment - 03/18/21 1110    Subjective Pt. expressed she was concerned about stopping to early in physical therapy, wanting to make sure she keeps getting better.  No pain indicated today.    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    Currently in Pain? No/denies                             [facility] Adult PT Treatment/Exercise - 03/18/21 0001      Neuro Re-ed    Neuro Re-ed Details  modified tandem ambulation on floor c CGA 15 ft x 10 fwd, modified tandem stance 90 seconds x 1 bilateral, feet together EC 2 mins c periods of EO for balance corrections (performed c CGA), CGA alternate toe tapping 4 inch step x 25 each leg      Knee/Hip Exercises: Aerobic   Nustep Lvl 6 15 mins      Knee/Hip Exercises: Machines for Strengthening   Cybex Knee Extension 2 x 10 single leg 10 lbs bilateral  Cybex Knee Flexion 2 x 10 single leg 10 lbs bilateral                    PT Short Term Goals - 02/11/21 1129      PT SHORT TERM GOAL #1   Title Patient will demonstrate independent use of home exercise program to maintain progress from in clinic treatments.    Time 3    Period Weeks    Status Achieved    Target Date 02/13/21             PT Long Term Goals - 03/04/21 1129      PT LONG TERM GOAL #1   Title Patient will demonstrate independent use of home exercise program to facilitate ability to maintain/progress functional gains from skilled physical therapy services.    Time 10    Period Weeks    Status On-going      PT LONG TERM GOAL #2   Title Pt. will demonstrate  bilateral hip MMT > or = 4/5 throughout to facilitate improved stabilty in ambulation, transfers.    Baseline flexion noted above 4/5.  will plan for abd testing in future - 03/04/2021 update    Time 10    Period Weeks    Status Partially Met      PT LONG TERM GOAL #3   Title Pt. will demonstrate BERG > 45 to indicated reduced fall risk.    Time 10    Period Weeks    Status On-going    Target Date 04/03/21      PT LONG TERM GOAL #4   Title Pt. will demonstrate TUG score < 14 seconds to facilitate reduced fall risk, improved stability in ambulation, performed c LRAD.    Time 10    Period Weeks    Status On-going    Target Date 04/03/21      PT LONG TERM GOAL #5   Title Pt. will demonstrate FOTO outcome > or = 56.    Time 10    Period Weeks    Status On-going    Target Date 04/03/21                 Plan - 03/18/21 1131    Clinical Impression Statement Attempts today to incorporate narrowed base of support proved difficult due to anxiety/fear and consistent reliance on very early stepping response in any postural sway.  Arrival today c SPC vs. walker (last 2 visits).  Rt leg fatigue more noted as well.    Personal Factors and Comorbidities Other   anxiety, fear of falling, Rt THA   Examination-Activity Limitations Squat;Bend;Bed Mobility;Stairs;Stand;Transfers;Lift;Locomotion Level    Examination-Participation Restrictions Community Activity;Yard Work;Laundry;Cleaning   community integration   Stability/Clinical Decision Making Evolving/Moderate complexity    Rehab Potential Fair    PT Frequency 2x / week    PT Duration Other (comment)   10 weeks   PT Treatment/Interventions ADLs/Self Care Home Management;Cryotherapy;Electrical Stimulation;Iontophoresis 77m/ml Dexamethasone;Moist Heat;Balance training;Therapeutic exercise;Therapeutic activities;Functional mobility training;Stair training;Gait training;DME Instruction;Ultrasound;Patient/family education;Manual techniques;Dry  needling;Passive range of motion;Spinal Manipulations;Joint Manipulations    PT Next Visit Plan Continue compliant and non compliant static and dynamic balance intervention c focus on narrowing base of support, LE strengthening/endurance intervention.    PT Home Exercise Plan [id number]   Consulted and Agree with Plan of Care Patient           Patient will benefit from skilled therapeutic intervention in order to improve the following deficits and  impairments:  Abnormal gait,Decreased endurance,Decreased strength,Decreased activity tolerance,Decreased balance,Decreased mobility,Difficulty walking,Impaired perceived functional ability,Decreased coordination,Decreased safety awareness  Visit Diagnosis: Pain in right ankle and joints of right foot  Muscle weakness (generalized)  Difficulty in walking, not elsewhere classified     Problem List Patient Active Problem List   Diagnosis Date Noted  . Generalized anxiety disorder 01/13/2021  . Fall 04/30/2020  . Osteoarthritis, multiple sites 04/30/2020  . Pneumonia 03/11/2020  . Muscle spasm 03/10/2020  . Right flank pain 03/10/2020  . Right shoulder pain 02/08/2020  . Right hip pain 02/08/2020  . Blood loss anemia 02/08/2020  . 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 Retained orthopedic hardware   . Closed fracture of right proximal humerus 01/29/2020  . Closed right hip fracture, initial encounter ([place]) 06/22/2019  . Environmental and seasonal allergies 06/12/2018  . Dyslipidemia 08/26/2017  . IBS (irritable bowel syndrome) 08/26/2017  . Angio-edema 11/03/2016  . Diverticulosis of colon without hemorrhage 09/30/2015  . Depression, recurrent ([place]) 08/22/2007  . GERD 07/04/2007    Scot Jun, PT, DPT, OCS, ATC 03/18/21  11:41 AM    [facility] [address] [place], [place], [address] Phone: [phone]   Fax:   [phone]  Name: SEE BEHARRY MRN: [id number] Date of Birth: 03-24-1946

## 2021-03-25 ENCOUNTER — Ambulatory Visit (INDEPENDENT_AMBULATORY_CARE_PROVIDER_SITE_OTHER): Payer: Medicare Other | Admitting: Rehabilitative and Restorative Service Providers"

## 2021-03-25 ENCOUNTER — Other Ambulatory Visit: Payer: Self-pay

## 2021-03-25 ENCOUNTER — Encounter: Payer: Self-pay | Admitting: Rehabilitative and Restorative Service Providers"

## 2021-03-25 DIAGNOSIS — M25571 Pain in right ankle and joints of right foot: Secondary | ICD-10-CM | POA: Diagnosis not present

## 2021-03-25 DIAGNOSIS — R262 Difficulty in walking, not elsewhere classified: Secondary | ICD-10-CM | POA: Diagnosis not present

## 2021-03-25 DIAGNOSIS — M6281 Muscle weakness (generalized): Secondary | ICD-10-CM

## 2021-03-25 NOTE — Therapy (Signed)
[facility] [address] [place], [place], [address] Phone: [phone]   Fax:  [phone]  Physical Therapy Treatment  Patient Details  Name: Emily Horton MRN: [id number] Date of Birth: [birth year] Referring Provider (PT): Dr. Ninfa Linden   Encounter Date: 03/25/2021   PT End of Session - 03/25/21 1111    Visit Number 15    Number of Visits 18    Date for PT Re-Evaluation 04/03/21    Authorization Type Medicare    Progress Note Due on Visit 18    PT Start Time 1100    PT Stop Time 1140    PT Time Calculation (min) 40 min    Equipment Utilized During Treatment Gait belt;Other (comment)   SPC   Activity Tolerance Patient limited by fatigue    Behavior During Therapy [facility] for tasks assessed/performed           Past Medical History:  Diagnosis Date  . Colon polyps 09-25-2007   Colonoscopy  . Depression   . Diverticulosis of colon (without mention of hemorrhage) 09-25-2007   Colonoscopy  . GERD (gastroesophageal reflux disease)   . Gynecological examination    sees Dr. Carren Rang   . Hyperlipidemia     Past Surgical History:  Procedure Laterality Date  . COLONOSCOPY  12/30/2015   per Dr. Henrene Pastor, benign  polyps, repeat in 5  yrs.   . ESOPHAGOGASTRODUODENOSCOPY  06-30-12   per Dr. Sharlett Iles, reflux but no Barretts seen   . HARDWARE REMOVAL Right 02/01/2020   Procedure: HARDWARE REMOVAL;  Surgeon: Mcarthur Rossetti, MD;  Location: [facility];  Service: Orthopedics;  Laterality: Right;  . INTRAMEDULLARY (IM) NAIL INTERTROCHANTERIC Right 06/22/2019  . INTRAMEDULLARY (IM) NAIL INTERTROCHANTERIC Right 06/22/2019   Procedure: INTRAMEDULLARY (IM) NAIL INTERTROCHANTRIC;  Surgeon: Altamese [place], MD;  Location: [place];  Service: Orthopedics;  Laterality: Right;  . REVERSE SHOULDER ARTHROPLASTY Right 01/29/2020   Procedure: RIGHT REVERSE SHOULDER ARTHROPLASTY;  Surgeon: Marchia Bond, MD;  Location: [facility];  Service: Orthopedics;  Laterality: Right;  .  TOTAL HIP ARTHROPLASTY Right 02/01/2020   Procedure: TOTAL HIP ARTHROPLASTY ANTERIOR APPROACH;  Surgeon: Mcarthur Rossetti, MD;  Location: [facility];  Service: Orthopedics;  Laterality: Right;    There were no vitals filed for this visit.   Subjective Assessment - 03/25/21 1109    Subjective Pt. stated Rt leg felt like it was dragging.  Pt. stated her leg was swollen around knee since Easter Sunday but more noted in last day or so.  (Visually noted by clinician in clinic).  Indicated her leg woudn't move at much. Worried about balance.    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    Currently in Pain? No/denies                             [facility] Adult PT Treatment/Exercise - 03/25/21 0001      Ambulation/Gait   Gait Comments SPC use, cues for avoiding holding onto items in clinic including chairs/wall.      Neuro Re-ed    Neuro Re-ed Details  modified tandem stance 2 mins bilateral c CGA and cognitive questioning during, feet on foam hip width eyes open 1 min, eyes closed 1 min, eyes opened c Lt and Rt head turns 20x each way - all with CGA  Knee/Hip Exercises: Aerobic   Recumbent Bike Full revolutions 10 mins seat 4      Knee/Hip Exercises: Standing   Other Standing Knee Exercises hip abduction, extension bilateral 20x each way with 2 hand on bar assist      Knee/Hip Exercises: Seated   Other Seated Knee/Hip Exercises seated SLR 2 x 10 bilateral    Sit to Sand 2 sets;10 reps;without UE support                  PT Education - 03/25/21 1135    Education Details Continued overall education on her presentation and abilities to improve confidence.    Person(s) Educated Patient    Methods Explanation    Comprehension Verbalized understanding            PT Short Term Goals - 02/11/21 1129      PT SHORT TERM  GOAL #1   Title Patient will demonstrate independent use of home exercise program to maintain progress from in clinic treatments.    Time 3    Period Weeks    Status Achieved    Target Date 02/13/21             PT Long Term Goals - 03/04/21 1129      PT LONG TERM GOAL #1   Title Patient will demonstrate independent use of home exercise program to facilitate ability to maintain/progress functional gains from skilled physical therapy services.    Time 10    Period Weeks    Status On-going      PT LONG TERM GOAL #2   Title Pt. will demonstrate bilateral hip MMT > or = 4/5 throughout to facilitate improved stabilty in ambulation, transfers.    Baseline flexion noted above 4/5.  will plan for abd testing in future - 03/04/2021 update    Time 10    Period Weeks    Status Partially Met      PT LONG TERM GOAL #3   Title Pt. will demonstrate BERG > 45 to indicated reduced fall risk.    Time 10    Period Weeks    Status On-going    Target Date 04/03/21      PT LONG TERM GOAL #4   Title Pt. will demonstrate TUG score < 14 seconds to facilitate reduced fall risk, improved stability in ambulation, performed c LRAD.    Time 10    Period Weeks    Status On-going    Target Date 04/03/21      PT LONG TERM GOAL #5   Title Pt. will demonstrate FOTO outcome > or = 56.    Time 10    Period Weeks    Status On-going    Target Date 04/03/21                 Plan - 03/25/21 1132    Clinical Impression Statement Performed combination of static balance intervention on compliant and non compliant surfaces with continued anxiety, periods of occasional loss of balance but overall once positioning was obtained and hand hold reliance was reduced, Pt. performed fair (better than her thoughts).  Pt. has demonstrated fair to good overall SPC use in clinic on level surfaces but still showed reliances of leaning on walls and furniture within clinic despite no observed instances of loos of balance.   Continued emphasis on improving confidence as well as overall physical performance on balance and strength to improve function and stabilty.    Personal Factors and  Comorbidities Other   anxiety, fear of falling, Rt THA   Examination-Activity Limitations Squat;Bend;Bed Mobility;Stairs;Stand;Transfers;Lift;Locomotion Level    Examination-Participation Restrictions Community Activity;Yard Work;Laundry;Cleaning   community integration   Stability/Clinical Decision Making Evolving/Moderate complexity    Rehab Potential Fair    PT Frequency 2x / week    PT Duration Other (comment)   10 weeks   PT Treatment/Interventions ADLs/Self Care Home Management;Cryotherapy;Electrical Stimulation;Iontophoresis 70m/ml Dexamethasone;Moist Heat;Balance training;Therapeutic exercise;Therapeutic activities;Functional mobility training;Stair training;Gait training;DME Instruction;Ultrasound;Patient/family education;Manual techniques;Dry needling;Passive range of motion;Spinal Manipulations;Joint Manipulations    PT Next Visit Plan Continue compliant and non compliant static and dynamic balance intervention c focus on narrowing base of support, LE strengthening/endurance intervention.  Continue efforts to build confidence    PT Home Exercise Plan [id number]   Consulted and Agree with Plan of Care Patient           Patient will benefit from skilled therapeutic intervention in order to improve the following deficits and impairments:  Abnormal gait,Decreased endurance,Decreased strength,Decreased activity tolerance,Decreased balance,Decreased mobility,Difficulty walking,Impaired perceived functional ability,Decreased coordination,Decreased safety awareness  Visit Diagnosis: Pain in right ankle and joints of right foot  Muscle weakness (generalized)  Difficulty in walking, not elsewhere classified     Problem List Patient Active Problem List   Diagnosis Date Noted  . Generalized anxiety disorder 01/13/2021  .  Fall 04/30/2020  . Osteoarthritis, multiple sites 04/30/2020  . Pneumonia 03/11/2020  . Muscle spasm 03/10/2020  . Right flank pain 03/10/2020  . Right shoulder pain 02/08/2020  . Right hip pain 02/08/2020  . Blood loss anemia 02/08/2020  . 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 Retained orthopedic hardware   . Closed fracture of right proximal humerus 01/29/2020  . [place]  . Environmental and seasonal allergies 06/12/2018  . Dyslipidemia 08/26/2017  . IBS (irritable bowel syndrome) 08/26/2017  . Angio-edema 11/03/2016  . Diverticulosis of colon without hemorrhage 09/30/2015  . Depression, recurrent ([place] 08/22/2007  . GERD 07/04/2007    MScot Jun PT, DPT, OCS, ATC 03/25/21  11:39 AM    [facility] [address] [address]Phone: [phone]  Fax:  [phone] Name: Emily LUSHERMRN: [id number] of Birth: [birth year]

## 2021-03-27 ENCOUNTER — Other Ambulatory Visit: Payer: Self-pay

## 2021-03-27 ENCOUNTER — Ambulatory Visit (INDEPENDENT_AMBULATORY_CARE_PROVIDER_SITE_OTHER): Payer: Medicare Other | Admitting: Rehabilitative and Restorative Service Providers"

## 2021-03-27 DIAGNOSIS — M6281 Muscle weakness (generalized): Secondary | ICD-10-CM

## 2021-03-27 DIAGNOSIS — R262 Difficulty in walking, not elsewhere classified: Secondary | ICD-10-CM

## 2021-03-27 DIAGNOSIS — M25571 Pain in right ankle and joints of right foot: Secondary | ICD-10-CM | POA: Diagnosis not present

## 2021-03-27 NOTE — Therapy (Signed)
[facility] [address] [place], [place], [address] Phone: [phone]   Fax:  [phone]  Physical Therapy Treatment  Patient Details  Name: Emily Horton MRN: [id number] Date of Birth: 21-Feb-1946 Referring Provider (PT): Dr. Ninfa Linden   Encounter Date: 03/27/2021   PT End of Session - 03/27/21 1059    Visit Number 16    Number of Visits 18    Date for PT Re-Evaluation 04/03/21    Authorization Type Medicare - KX required at this time    Progress Note Due on Visit 18    PT Start Time 1055    PT Stop Time 1135    PT Time Calculation (min) 40 min    Equipment Utilized During Treatment Gait belt;Other (comment)   SPC   Activity Tolerance Patient limited by fatigue    Behavior During Therapy [facility] for tasks assessed/performed           Past Medical History:  Diagnosis Date  . Colon polyps 09-25-2007   Colonoscopy  . Depression   . Diverticulosis of colon (without mention of hemorrhage) 09-25-2007   Colonoscopy  . GERD (gastroesophageal reflux disease)   . Gynecological examination    sees Dr. Carren Rang   . Hyperlipidemia     Past Surgical History:  Procedure Laterality Date  . COLONOSCOPY  12/30/2015   per Dr. Henrene Pastor, benign  polyps, repeat in 5  yrs.   . ESOPHAGOGASTRODUODENOSCOPY  06-30-12   per Dr. Sharlett Iles, reflux but no Barretts seen   . HARDWARE REMOVAL Right 02/01/2020   Procedure: HARDWARE REMOVAL;  Surgeon: Mcarthur Rossetti, MD;  [facility]: [facility];  Service: Orthopedics;  Laterality: Right;  . INTRAMEDULLARY (IM) NAIL INTERTROCHANTERIC Right 06/22/2019  . INTRAMEDULLARY (IM) NAIL INTERTROCHANTERIC Right 06/22/2019   Procedure: INTRAMEDULLARY (IM) NAIL INTERTROCHANTRIC;  Surgeon: Altamese [place], MD;  Location: [place];  Service: Orthopedics;  Laterality: Right;  . REVERSE SHOULDER ARTHROPLASTY Right 01/29/2020   Procedure: RIGHT REVERSE SHOULDER ARTHROPLASTY;  Surgeon: Marchia Bond, MD;  Location: [facility];  Service:  Orthopedics;  Laterality: Right;  . TOTAL HIP ARTHROPLASTY Right 02/01/2020   Procedure: TOTAL HIP ARTHROPLASTY ANTERIOR APPROACH;  Surgeon: Mcarthur Rossetti, MD;  Location: [facility];  Service: Orthopedics;  Laterality: Right;    There were no vitals filed for this visit.   Subjective Assessment - 03/27/21 1105    Subjective Pt. stated feeling like her leg felt some better after last visit but still had concerns about the swelling.  Asked about compression stockings (gave information about medical grade stockings and advise for discussion with MD )    Pertinent History Rt THA 02/05/20    Limitations Standing;Walking;House hold activities    Patient Stated Goals Wants to be "normal", go shopping, walk independent and reduce falls.    Currently in Pain? No/denies                             [facility] Adult PT Treatment/Exercise - 03/27/21 0001      Therapeutic Activites    Other Therapeutic Activities Step up 4 inch c one hand assist on rail x20 bilateral for improved functional mobility, sit to stand x 15 c eccentric focus control, SPC ambulation in clinic for functional mobility safety improvement 50 ft x 5 level surfaces      Neuro Re-ed    Neuro Re-ed Details  alt toe tapping 4 inch 2 x 10 bilateral c CGA, modified tandem stance 90  sec bilateral c CGA, BIG inspired retro step c contralteral toe lift x 15 bilateral c CGA      Knee/Hip Exercises: Aerobic   Nustep Lvl 6 12 mins      Knee/Hip Exercises: Seated   Other Seated Knee/Hip Exercises seated SLR 2 x 10 bilateral    Marching 2 sets;10 reps;Both   1.5 lbs                   PT Short Term Goals - 02/11/21 1129      PT SHORT TERM GOAL #1   Title Patient will demonstrate independent use of home exercise program to maintain progress from in clinic treatments.    Time 3    Period Weeks    Status Achieved    Target Date 02/13/21             PT Long Term Goals - 03/04/21 1129      PT LONG TERM  GOAL #1   Title Patient will demonstrate independent use of home exercise program to facilitate ability to maintain/progress functional gains from skilled physical therapy services.    Time 10    Period Weeks    Status On-going      PT LONG TERM GOAL #2   Title Pt. will demonstrate bilateral hip MMT > or = 4/5 throughout to facilitate improved stabilty in ambulation, transfers.    Baseline flexion noted above 4/5.  will plan for abd testing in future - 03/04/2021 update    Time 10    Period Weeks    Status Partially Met      PT LONG TERM GOAL #3   Title Pt. will demonstrate BERG > 45 to indicated reduced fall risk.    Time 10    Period Weeks    Status On-going    Target Date 04/03/21      PT LONG TERM GOAL #4   Title Pt. will demonstrate TUG score < 14 seconds to facilitate reduced fall risk, improved stability in ambulation, performed c LRAD.    Time 10    Period Weeks    Status On-going    Target Date 04/03/21      PT LONG TERM GOAL #5   Title Pt. will demonstrate FOTO outcome > or = 56.    Time 10    Period Weeks    Status On-going    Target Date 04/03/21                 Plan - 03/27/21 1115    Clinical Impression Statement Focus today on reduced difficulty balance intervention (static, non compliant surfaces) in effort to still challenge Pt. but provide instances of successful performance to improve confidence in movement control.  Medical necessity for continued skilled PT services indicated at this time to help reduce fall risk and improve community and household integration by improving functional mobility.    Personal Factors and Comorbidities Other   anxiety, fear of falling, Rt THA   Examination-Activity Limitations Squat;Bend;Bed Mobility;Stairs;Stand;Transfers;Lift;Locomotion Level    Examination-Participation Restrictions Community Activity;Yard Work;Laundry;Cleaning   community integration   Stability/Clinical Decision Making Evolving/Moderate complexity     Rehab Potential Fair    PT Frequency 2x / week    PT Duration Other (comment)   10 weeks   PT Treatment/Interventions ADLs/Self Care Home Management;Cryotherapy;Electrical Stimulation;Iontophoresis 25m/ml Dexamethasone;Moist Heat;Balance training;Therapeutic exercise;Therapeutic activities;Functional mobility training;Stair training;Gait training;DME Instruction;Ultrasound;Patient/family education;Manual techniques;Dry needling;Passive range of motion;Spinal Manipulations;Joint Manipulations    PT Next Visit Plan  Recertification next visit/progress note for extension of care due to medical necessity for continued skilled PT services to improve balance/reduce fall risk for functional daily mobility.  KX required    PT Home Exercise Plan [id number]    Consulted and Agree with Plan of Care Patient           Patient will benefit from skilled therapeutic intervention in order to improve the following deficits and impairments:  Abnormal gait,Decreased endurance,Decreased strength,Decreased activity tolerance,Decreased balance,Decreased mobility,Difficulty walking,Impaired perceived functional ability,Decreased coordination,Decreased safety awareness  Visit Diagnosis: Pain in right ankle and joints of right foot  Muscle weakness (generalized)  Difficulty in walking, not elsewhere classified     Problem List Patient Active Problem List   Diagnosis Date Noted  . Generalized anxiety disorder 01/13/2021  . Fall 04/30/2020  . Osteoarthritis, multiple sites 04/30/2020  . Pneumonia 03/11/2020  . Muscle spasm 03/10/2020  . Right flank pain 03/10/2020  . Right shoulder pain 02/08/2020  . Right hip pain 02/08/2020  . Blood loss anemia 02/08/2020  . 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 Retained orthopedic hardware   . Closed fracture of right proximal humerus 01/29/2020  . Closed right hip fracture, initial encounter ([address]) 06/22/2019  .  Environmental and seasonal allergies 06/12/2018  . Dyslipidemia 08/26/2017  . IBS (irritable bowel syndrome) 08/26/2017  . Angio-edema 11/03/2016  . Diverticulosis of colon without hemorrhage 09/30/2015  . Depression, recurrent ([place]) 08/22/2007  . GERD 07/04/2007   Scot Jun, PT, DPT, OCS, ATC 03/27/21  11:32 AM    [facility] [address] [place], [place], [address] Phone: [phone]   Fax:  [phone]  Name: Emily Horton MRN: [id number] Date of Birth: 04/23/1946

## 2021-04-10 ENCOUNTER — Ambulatory Visit (INDEPENDENT_AMBULATORY_CARE_PROVIDER_SITE_OTHER): Payer: Medicare Other | Admitting: Rehabilitative and Restorative Service Providers"

## 2021-04-10 ENCOUNTER — Other Ambulatory Visit: Payer: Self-pay

## 2021-04-10 ENCOUNTER — Encounter: Payer: Self-pay | Admitting: Rehabilitative and Restorative Service Providers"

## 2021-04-10 DIAGNOSIS — R262 Difficulty in walking, not elsewhere classified: Secondary | ICD-10-CM

## 2021-04-10 DIAGNOSIS — M25571 Pain in right ankle and joints of right foot: Secondary | ICD-10-CM

## 2021-04-10 DIAGNOSIS — M6281 Muscle weakness (generalized): Secondary | ICD-10-CM

## 2021-04-10 NOTE — Therapy (Signed)
[facility] [address] [place], [place], [address] Phone: [phone]   Fax:  [phone]  Physical Therapy Treatment/Progress Note/Recertification  Patient Details  Name: Emily Horton MRN: [id number] Date of Birth: December 24, 1945 Referring Provider (PT): Dr. Ninfa Linden   Encounter Date: 04/10/2021   Progress Note Reporting Period 03/04/2021 to 04/10/2021  See note below for Objective Data and Assessment of Progress/Goals.        PT End of Session - 04/10/21 1103    Visit Number 17    Number of Visits 37    Date for PT Re-Evaluation 06/19/21    Authorization Type Medicare - KX required at this time    Progress Note Due on Visit 27    PT Start Time 1055    PT Stop Time 1140    PT Time Calculation (min) 45 min    Equipment Utilized During Treatment Gait belt;Other (comment)   SPC   Activity Tolerance Patient limited by fatigue    Behavior During Therapy [facility] for tasks assessed/performed           Past Medical History:  Diagnosis Date  . Colon polyps 09-25-2007   Colonoscopy  . Depression   . Diverticulosis of colon (without mention of hemorrhage) 09-25-2007   Colonoscopy  . GERD (gastroesophageal reflux disease)   . Gynecological examination    sees Dr. Carren Rang   . Hyperlipidemia     Past Surgical History:  Procedure Laterality Date  . COLONOSCOPY  12/30/2015   per Dr. Henrene Pastor, benign  polyps, repeat in 5  yrs.   . ESOPHAGOGASTRODUODENOSCOPY  06-30-12   per Dr. Sharlett Iles, reflux but no Barretts seen   . HARDWARE REMOVAL Right 02/01/2020   Procedure: HARDWARE REMOVAL;  Surgeon: Mcarthur Rossetti, MD;  Location: [facility];  Service: Orthopedics;  Laterality: Right;  . INTRAMEDULLARY (IM) NAIL INTERTROCHANTERIC Right 06/22/2019  . INTRAMEDULLARY (IM) NAIL INTERTROCHANTERIC Right 06/22/2019   Procedure: INTRAMEDULLARY (IM) NAIL INTERTROCHANTRIC;  Surgeon: Altamese , MD;  Location: [facility];  Service: Orthopedics;  Laterality: Right;  .  REVERSE SHOULDER ARTHROPLASTY Right 01/29/2020   Procedure: RIGHT REVERSE SHOULDER ARTHROPLASTY;  Surgeon: Marchia Bond, MD;  Location: [facility];  Service: Orthopedics;  Laterality: Right;  . TOTAL HIP ARTHROPLASTY Right 02/01/2020   Procedure: TOTAL HIP ARTHROPLASTY ANTERIOR APPROACH;  Surgeon: Mcarthur Rossetti, MD;  Location: [facility];  Service: Orthopedics;  Laterality: Right;    There were no vitals filed for this visit.   Subjective Assessment - 04/10/21 1058    Subjective Pt. indicated Global rating of change +4 moderately better.  Pt. stated she can manuever better by her self without help, feeling some improvement c walking c cane.  Pt. stated she has more confidence than before so she goes out of house a bit more.  Still has concerns about falling, indicating one instance at home in hallway where she fell against wall(but didn't fall to floor).    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    Currently in Pain? No/denies              [facility] PT Assessment - 04/10/21 0001      Assessment   Medical Diagnosis Rt ankle pain/foot pain, LE weakness    Referring Provider (PT) Dr. Ninfa Linden    Onset Date/Surgical Date 01/07/20      Observation/Other Assessments   Focus  on Therapeutic Outcomes (FOTO)  update 44%      Strength   Right Hip Flexion 4+/5    Left Hip Flexion 5/5    Right Knee Flexion 5/5    Right Knee Extension 5/5    Left Knee Flexion 5/5    Left Knee Extension 5/5      Ambulation/Gait   Gait Pattern Antalgic;Wide base of support;Poor foot clearance - right;Decreased stride length;Decreased step length - right   worsened c fatigue   Gait Comments SPC use in Lt UE, sometimes using FWW in public for longer distances.  When near wall/doorway/railing, Pt. has tendency to lean into the supporting structure with trunk which creates  instability due to weight shifting as well as marked reduction in gait speed.  Present despite cues.      Berg Balance Test   Sit to Stand Able to stand without using hands and stabilize independently    Standing Unsupported Able to stand safely 2 minutes    Sitting with Back Unsupported but Feet Supported on Floor or Stool Able to sit safely and securely 2 minutes    Stand to Sit Sits safely with minimal use of hands    Transfers Able to transfer safely, minor use of hands    Standing Unsupported with Eyes Closed Able to stand 10 seconds with supervision    Standing Unsupported with Feet Together Able to place feet together independently and stand for 1 minute with supervision    From Standing, Reach Forward with Outstretched Arm Can reach forward >12 cm safely (5")    From Standing Position, Pick up Object from [place] to pick up shoe, needs supervision    From Standing Position, Turn to Look Behind Over each Shoulder Looks behind from both sides and weight shifts well    Turn 360 Degrees Able to turn 360 degrees safely but slowly    Standing Unsupported, Alternately Place Feet on Step/Stool Able to complete >2 steps/needs minimal assist    Standing Unsupported, One Foot in Front Able to take small step independently and hold 30 seconds    Standing on One Leg Tries to lift leg/unable to hold 3 seconds but remains standing independently    Total Score 42      Timed Up and Go Test   Normal TUG (seconds) 15.33   SPC in Lt UE                        [facility] Adult PT Treatment/Exercise - 04/10/21 0001      Neuro Re-ed    Neuro Re-ed Details  BERG balance testing performed today, in addition: modified tandem stance 1 min x 2 bilateral, alt toe tapping to 6 inch cone x 12 bilateral, lateral stepping c cues for step length 15 ft x 2 each way.   all performed c SBA to Min A at times to correct loss of balance periodically during.  Consistent cues during for technique     Knee/Hip  Exercises: Aerobic   Nustep Lvl 6 12 mins      Knee/Hip Exercises: Machines for Strengthening   Cybex Leg Press single leg 25 lbs 2 x 10 bilateral      Knee/Hip Exercises: Seated   Sit to Sand without UE support;2 sets;10 reps   slow eccentric lowering cues                 PT Education - 04/10/21 1238    Education Details POC  education    Person(s) Educated Patient    Methods Explanation    Comprehension Verbalized understanding            PT Short Term Goals - 02/11/21 1129      PT SHORT TERM GOAL #1   Title Patient will demonstrate independent use of home exercise program to maintain progress from in clinic treatments.    Time 3    Period Weeks    Status Achieved    Target Date 02/13/21             PT Long Term Goals - 04/10/21 1236      PT LONG TERM GOAL #1   Title Patient will demonstrate independent use of home exercise program to facilitate ability to maintain/progress functional gains from skilled physical therapy services.    Time 10    Period Weeks    Status Revised    Target Date 06/19/21      PT LONG TERM GOAL #2   Title Pt. will demonstrate bilateral hip MMT > or = 4/5 throughout to facilitate improved stabilty in ambulation, transfers.    Time 10    Period Weeks    Status Achieved      PT LONG TERM GOAL #3   Title Pt. will demonstrate BERG > 45 to indicated reduced fall risk.    Time 10    Period Weeks    Status Revised    Target Date 06/19/21      PT LONG TERM GOAL #4   Title Pt. will demonstrate TUG score < 14 seconds to facilitate reduced fall risk, improved stability in ambulation, performed c LRAD.    Time 10    Period Weeks    Status Revised    Target Date 06/19/21      PT LONG TERM GOAL #5   Title Pt. will demonstrate FOTO outcome > or = 56.    Time 10    Period Weeks    Status Revised    Target Date 06/19/21                 Plan - 04/10/21 1107    Clinical Impression Statement Pt. has attended 17 visits  overall and 9 visits since last progress note.  Pt. has reported Global Rating of Change +4 moderately better at this time.  Pt. indicated variable use of SPC and FWW for longer distances.  See objective data for updated information.  Pt. has demonstrate slow overall improvements but objective data assessment revealed improvements each assessment for TUG, BERG balance testing as compared to previous.  Continued education and intervention through skilled PT services warranted at this time to continue to improve household and community navigation c reduced fall risk.  Additional time and recertification required at this time to continue to make progress.  Without continued care at this time, Pt. is at risk for reversion of progresses to this point.    Personal Factors and Comorbidities Other   anxiety, fear of falling, Rt THA   Examination-Activity Limitations Squat;Bend;Bed Mobility;Stairs;Stand;Transfers;Lift;Locomotion Level    Examination-Participation Restrictions Community Activity;Yard Work;Laundry;Cleaning   community integration   Stability/Clinical Decision Making Evolving/Moderate complexity    Rehab Potential Fair    PT Frequency 2x / week    PT Duration Other (comment)   10 weeks   PT Treatment/Interventions ADLs/Self Care Home Management;Cryotherapy;Electrical Stimulation;Iontophoresis 4mg /ml Dexamethasone;Moist Heat;Balance training;Therapeutic exercise;Therapeutic activities;Functional mobility training;Stair training;Gait training;DME Instruction;Ultrasound;Patient/family education;Manual techniques;Dry needling;Passive range of motion;Spinal Manipulations;Joint Manipulations  PT Next Visit Plan KX required.  Continued neuro-re education for improved static and dynamic stability.    PT Home Exercise Plan [id number]    Consulted and Agree with Plan of Care Patient           Patient will benefit from skilled therapeutic intervention in order to improve the following deficits and  impairments:  Abnormal gait,Decreased endurance,Decreased strength,Decreased activity tolerance,Decreased balance,Decreased mobility,Difficulty walking,Impaired perceived functional ability,Decreased coordination,Decreased safety awareness  Visit Diagnosis: Pain in right ankle and joints of right foot  Muscle weakness (generalized)  Difficulty in walking, not elsewhere classified     Problem List Patient Active Problem List   Diagnosis Date Noted  . Generalized anxiety disorder 01/13/2021  . Fall 04/30/2020  . Osteoarthritis, multiple sites 04/30/2020  . Pneumonia 03/11/2020  . Muscle spasm 03/10/2020  . Right flank pain 03/10/2020  . Right shoulder pain 02/08/2020  . Right hip pain 02/08/2020  . Blood loss anemia 02/08/2020  . 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 Retained orthopedic hardware   . Closed fracture of right proximal humerus 01/29/2020  . Closed right hip fracture, initial encounter ([place]) 06/22/2019  . Environmental and seasonal allergies 06/12/2018  . Dyslipidemia 08/26/2017  . IBS (irritable bowel syndrome) 08/26/2017  . Angio-edema 11/03/2016  . Diverticulosis of colon without hemorrhage 09/30/2015  . Depression, recurrent ([place]) 08/22/2007  . GERD 07/04/2007    Scot Jun, PT, DPT, OCS, ATC 04/10/21  12:42 PM    [facility] [address] [place], [place], [address] Phone: [phone]   Fax:  [phone]  Name: Emily Horton MRN: [id number] Date of Birth: 03-28-1946

## 2021-04-17 ENCOUNTER — Other Ambulatory Visit: Payer: Self-pay

## 2021-04-17 ENCOUNTER — Ambulatory Visit (INDEPENDENT_AMBULATORY_CARE_PROVIDER_SITE_OTHER): Payer: Medicare Other | Admitting: Rehabilitative and Restorative Service Providers"

## 2021-04-17 ENCOUNTER — Encounter: Payer: Self-pay | Admitting: Rehabilitative and Restorative Service Providers"

## 2021-04-17 DIAGNOSIS — R262 Difficulty in walking, not elsewhere classified: Secondary | ICD-10-CM | POA: Diagnosis not present

## 2021-04-17 DIAGNOSIS — M25571 Pain in right ankle and joints of right foot: Secondary | ICD-10-CM

## 2021-04-17 DIAGNOSIS — M6281 Muscle weakness (generalized): Secondary | ICD-10-CM

## 2021-04-17 NOTE — Therapy (Signed)
[facility] [address] [place], [place], [address] Phone: [phone]   Fax:  [phone]  Physical Therapy Treatment  Patient Details  Name: Emily Horton MRN: [id number] Date of Birth: 1945/12/25 Referring Provider (PT): Dr. Ninfa Linden   Encounter Date: 04/17/2021   PT End of Session - 04/17/21 1101    Visit Number 18    Number of Visits 37    Date for PT Re-Evaluation 06/19/21    Authorization Type Medicare - KX required at this time    Progress Note Due on Visit 27    PT Start Time 1058    PT Stop Time 1138    PT Time Calculation (min) 40 min    Equipment Utilized During Treatment Gait belt;Other (comment)   SPC   Activity Tolerance Patient limited by fatigue    Behavior During Therapy [facility] for tasks assessed/performed           Past Medical History:  Diagnosis Date  . Colon polyps 09-25-2007   Colonoscopy  . Depression   . Diverticulosis of colon (without mention of hemorrhage) 09-25-2007   Colonoscopy  . GERD (gastroesophageal reflux disease)   . Gynecological examination    sees Dr. Carren Rang   . Hyperlipidemia     Past Surgical History:  Procedure Laterality Date  . COLONOSCOPY  12/30/2015   per Dr. Henrene Pastor, benign  polyps, repeat in 5  yrs.   . ESOPHAGOGASTRODUODENOSCOPY  06-30-12   per Dr. Sharlett Iles, reflux but no Barretts seen   . HARDWARE REMOVAL Right 02/01/2020   Procedure: HARDWARE REMOVAL;  Surgeon: Mcarthur Rossetti, MD;  [facility]: [facility];  Service: Orthopedics;  Laterality: Right;  . INTRAMEDULLARY (IM) NAIL INTERTROCHANTERIC Right 06/22/2019  . INTRAMEDULLARY (IM) NAIL INTERTROCHANTERIC Right 06/22/2019   Procedure: INTRAMEDULLARY (IM) NAIL INTERTROCHANTRIC;  Surgeon: Altamese Cash, MD;  Location: [place];  Service: Orthopedics;  Laterality: Right;  . REVERSE SHOULDER ARTHROPLASTY Right 01/29/2020   Procedure: RIGHT REVERSE SHOULDER ARTHROPLASTY;  Surgeon: Marchia Bond, MD;  [facility];  Service:  Orthopedics;  Laterality: Right;  . TOTAL HIP ARTHROPLASTY Right 02/01/2020   Procedure: TOTAL HIP ARTHROPLASTY ANTERIOR APPROACH;  Surgeon: Mcarthur Rossetti, MD;  [facility];  Service: Orthopedics;  Laterality: Right;    There were no vitals filed for this visit.   Subjective Assessment - 04/17/21 1100    Subjective Pt. indicated no pain complaints.  Pt. stated she might be doing some better but still gets nervous getting out and about.    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    Currently in Pain? No/denies    Pain Score 0-No pain                             [facility] Adult PT Treatment/Exercise - 04/17/21 0001      Therapeutic Activites    Other Therapeutic Activities ramp and curb navigation (6 inch curb) c SPC and CGA c cues for sequencing x 5 up/down, step up 6inch step c SPC use/CGA 2 x 10 bilateral,      Neuro Re-ed    Neuro Re-ed Details  standing feet together on foam CGA/occsaional min A eyes open 1 min x 2, retro step 20 x bilateral c CGA      Knee/Hip Exercises: Aerobic   Nustep Lvl 6  15 mins      Knee/Hip Exercises: Seated   Other Seated Knee/Hip Exercises seated SLR 2 x 10 bilateral                    PT Short Term Goals - 02/11/21 1129      PT SHORT TERM GOAL #1   Title Patient will demonstrate independent use of home exercise program to maintain progress from in clinic treatments.    Time 3    Period Weeks    Status Achieved    Target Date 02/13/21             PT Long Term Goals - 04/10/21 1236      PT LONG TERM GOAL #1   Title Patient will demonstrate independent use of home exercise program to facilitate ability to maintain/progress functional gains from skilled physical therapy services.    Time 10    Period Weeks    Status Revised    Target Date 06/19/21      PT  LONG TERM GOAL #2   Title Pt. will demonstrate bilateral hip MMT > or = 4/5 throughout to facilitate improved stabilty in ambulation, transfers.    Time 10    Period Weeks    Status Achieved      PT LONG TERM GOAL #3   Title Pt. will demonstrate BERG > 45 to indicated reduced fall risk.    Time 10    Period Weeks    Status Revised    Target Date 06/19/21      PT LONG TERM GOAL #4   Title Pt. will demonstrate TUG score < 14 seconds to facilitate reduced fall risk, improved stability in ambulation, performed c LRAD.    Time 10    Period Weeks    Status Revised    Target Date 06/19/21      PT LONG TERM GOAL #5   Title Pt. will demonstrate FOTO outcome > or = 56.    Time 10    Period Weeks    Status Revised    Target Date 06/19/21                 Plan - 04/17/21 1133    Clinical Impression Statement Pt. was able to perform better with more confidence on therapeutic activity , specifically up/down ramp today with only one instance of loss of balance requiring assistance.  Still not able to perform in clinic without close guarding but making gains compared to previous.  Medical necessity for continued skilled PT services indicated due to fall risk.    Personal Factors and Comorbidities Other   anxiety, fear of falling, Rt THA   Examination-Activity Limitations Squat;Bend;Bed Mobility;Stairs;Stand;Transfers;Lift;Locomotion Level    Examination-Participation Restrictions Community Activity;Yard Work;Laundry;Cleaning   community integration   Stability/Clinical Decision Making Evolving/Moderate complexity    Rehab Potential Fair    PT Frequency 2x / week    PT Duration Other (comment)   10 weeks   PT Treatment/Interventions ADLs/Self Care Home Management;Cryotherapy;Electrical Stimulation;Iontophoresis 4mg /ml Dexamethasone;Moist Heat;Balance training;Therapeutic exercise;Therapeutic activities;Functional mobility training;Stair training;Gait training;DME  Instruction;Ultrasound;Patient/family education;Manual techniques;Dry needling;Passive range of motion;Spinal Manipulations;Joint Manipulations    PT Next Visit Plan KX required.  Continued neuro-re education for improved static and dynamic stability.    PT Home Exercise Plan [id number]    Consulted and Agree with Plan of Care Patient           Patient will benefit from skilled therapeutic intervention in order to improve the  following deficits and impairments:  Abnormal gait,Decreased endurance,Decreased strength,Decreased activity tolerance,Decreased balance,Decreased mobility,Difficulty walking,Impaired perceived functional ability,Decreased coordination,Decreased safety awareness  Visit Diagnosis: Pain in right ankle and joints of right foot  Muscle weakness (generalized)  Difficulty in walking, not elsewhere classified     Problem List Patient Active Problem List   Diagnosis Date Noted  . Generalized anxiety disorder 01/13/2021  . Fall 04/30/2020  . Osteoarthritis, multiple sites 04/30/2020  . Pneumonia 03/11/2020  . Muscle spasm 03/10/2020  . Right flank pain 03/10/2020  . Right shoulder pain 02/08/2020  . Right hip pain 02/08/2020  . Blood loss anemia 02/08/2020  . 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 Retained orthopedic hardware   . Closed fracture of right proximal humerus 01/29/2020  . Closed right hip fracture, initial encounter ([place]) 06/22/2019  . Environmental and seasonal allergies 06/12/2018  . Dyslipidemia 08/26/2017  . IBS (irritable bowel syndrome) 08/26/2017  . Angio-edema 11/03/2016  . Diverticulosis of colon without hemorrhage 09/30/2015  . Depression, recurrent ([place]) 08/22/2007  . GERD 07/04/2007   Scot Jun, PT, DPT, OCS, ATC 04/17/21  11:37 AM    [facility] [address] [place], [place], [address] Phone: [phone]   Fax:   [phone]  Name: Emily Horton MRN: [id number] Date of Birth: May 10, 1946

## 2021-04-22 ENCOUNTER — Other Ambulatory Visit: Payer: Self-pay

## 2021-04-22 ENCOUNTER — Ambulatory Visit (INDEPENDENT_AMBULATORY_CARE_PROVIDER_SITE_OTHER): Payer: Medicare Other | Admitting: Rehabilitative and Restorative Service Providers"

## 2021-04-22 ENCOUNTER — Encounter: Payer: Self-pay | Admitting: Rehabilitative and Restorative Service Providers"

## 2021-04-22 DIAGNOSIS — R262 Difficulty in walking, not elsewhere classified: Secondary | ICD-10-CM | POA: Diagnosis not present

## 2021-04-22 DIAGNOSIS — M6281 Muscle weakness (generalized): Secondary | ICD-10-CM

## 2021-04-22 DIAGNOSIS — M25571 Pain in right ankle and joints of right foot: Secondary | ICD-10-CM | POA: Diagnosis not present

## 2021-04-22 NOTE — Therapy (Signed)
[facility] Physical Therapy [address] [place], [place], [address] Phone: [phone]   Fax:  [phone]  Physical Therapy Treatment  Patient Details  Name: Emily Horton MRN: [id number] Date of Birth: 09/02/1946 Referring Provider (PT): Dr. Ninfa Linden   Encounter Date: 04/22/2021   PT End of Session - 04/22/21 1011    Visit Number 19    Number of Visits 37    Date for PT Re-Evaluation 06/19/21    Authorization Type Medicare - KX required at this time    Progress Note Due on Visit 27    PT Start Time 1015    PT Stop Time 1057    PT Time Calculation (min) 42 min    Equipment Utilized During Treatment Gait belt;Other (comment)   SPC   Activity Tolerance Patient limited by fatigue    Behavior During Therapy [facility] for tasks assessed/performed           Past Medical History:  Diagnosis Date  . Colon polyps 09-25-2007   Colonoscopy  . Depression   . Diverticulosis of colon (without mention of hemorrhage) 09-25-2007   Colonoscopy  . GERD (gastroesophageal reflux disease)   . Gynecological examination    sees Dr. Carren Rang   . Hyperlipidemia     Past Surgical History:  Procedure Laterality Date  . COLONOSCOPY  12/30/2015   per Dr. Henrene Pastor, benign  polyps, repeat in 5  yrs.   . ESOPHAGOGASTRODUODENOSCOPY  06-30-12   per Dr. Sharlett Iles, reflux but no Barretts seen   . HARDWARE REMOVAL Right 02/01/2020   Procedure: HARDWARE REMOVAL;  Surgeon: Mcarthur Rossetti, MD;  [facility]: [facility];  Service: Orthopedics;  Laterality: Right;  . INTRAMEDULLARY (IM) NAIL INTERTROCHANTERIC Right 06/22/2019  . INTRAMEDULLARY (IM) NAIL INTERTROCHANTERIC Right 06/22/2019   Procedure: INTRAMEDULLARY (IM) NAIL INTERTROCHANTRIC;  Surgeon: Altamese Yachats, MD;  Location: [place];  Service: Orthopedics;  Laterality: Right;  . REVERSE SHOULDER ARTHROPLASTY Right 01/29/2020   Procedure: RIGHT REVERSE SHOULDER ARTHROPLASTY;  Surgeon: Marchia Bond, MD;  [facility];  Service:  Orthopedics;  Laterality: Right;  . TOTAL HIP ARTHROPLASTY Right 02/01/2020   Procedure: TOTAL HIP ARTHROPLASTY ANTERIOR APPROACH;  Surgeon: Mcarthur Rossetti, MD;  [facility];  Service: Orthopedics;  Laterality: Right;    There were no vitals filed for this visit.       [facility] PT Assessment - 04/22/21 0001      Assessment   Medical Diagnosis Rt ankle pain/foot pain, LE weakness    Referring Provider (PT) Dr. Ninfa Linden    Onset Date/Surgical Date 01/07/20      Transfers   Comments 18 inch chair transfer s UE c improved control, no leg bracing on chair in transfer attempts      Balance   Balance Assessed Yes                         [facility] Adult PT Treatment/Exercise - 04/22/21 0001      Therapeutic Activites    Other Therapeutic Activities step up 4 inch step one hand assist 2 x 10 bilateral c CGA, ramp ascending/descending c SPC use, CGA up/down x 10, sit to stand transfers no UE 18 inch chair x 10      Neuro Re-ed    Neuro Re-ed Details  alt toe tapping c CGA 2 x 10 bilateral on 4 inch step, tandem ambulation (modified at times) 15 ft c cGA      Knee/Hip Exercises: Aerobic  Nustep Lvl 6 15 mins      Knee/Hip Exercises: Machines for Strengthening   Cybex Leg Press single leg 31 lbs 2 x 15 bilateral                    PT Short Term Goals - 02/11/21 1129      PT SHORT TERM GOAL #1   Title Patient will demonstrate independent use of home exercise program to maintain progress from in clinic treatments.    Time 3    Period Weeks    Status Achieved    Target Date 02/13/21             PT Long Term Goals - 04/10/21 1236      PT LONG TERM GOAL #1   Title Patient will demonstrate independent use of home exercise program to facilitate ability to maintain/progress functional gains from skilled physical therapy services.    Time 10    Period Weeks    Status Revised    Target Date 06/19/21      PT LONG TERM GOAL #2   Title Pt. will  demonstrate bilateral hip MMT > or = 4/5 throughout to facilitate improved stabilty in ambulation, transfers.    Time 10    Period Weeks    Status Achieved      PT LONG TERM GOAL #3   Title Pt. will demonstrate BERG > 45 to indicated reduced fall risk.    Time 10    Period Weeks    Status Revised    Target Date 06/19/21      PT LONG TERM GOAL #4   Title Pt. will demonstrate TUG score < 14 seconds to facilitate reduced fall risk, improved stability in ambulation, performed c LRAD.    Time 10    Period Weeks    Status Revised    Target Date 06/19/21      PT LONG TERM GOAL #5   Title Pt. will demonstrate FOTO outcome > or = 56.    Time 10    Period Weeks    Status Revised    Target Date 06/19/21                 Plan - 04/22/21 1054    Clinical Impression Statement Focus on continued improvement in performance on dynamic stability, functional tasks (ramps, transfers, stairs).  Pt. has demonstrated reduced instances of loss of balance requiring clinician correction.  Pt. continued to present c anxiety of falling which can result in instances of leaning into doorframes and walls, extended reaching to furniture and items which can actually create weight shifting outside of her postural correction, resulting in instances of loss of balance control.    Personal Factors and Comorbidities Other   anxiety, fear of falling, Rt THA   Examination-Activity Limitations Squat;Bend;Bed Mobility;Stairs;Stand;Transfers;Lift;Locomotion Level    Examination-Participation Restrictions Community Activity;Yard Work;Laundry;Cleaning   community integration   Stability/Clinical Decision Making Evolving/Moderate complexity    Rehab Potential Fair    PT Frequency 2x / week    PT Duration Other (comment)   10 weeks   PT Treatment/Interventions ADLs/Self Care Home Management;Cryotherapy;Electrical Stimulation;Iontophoresis 4mg /ml Dexamethasone;Moist Heat;Balance training;Therapeutic exercise;Therapeutic  activities;Functional mobility training;Stair training;Gait training;DME Instruction;Ultrasound;Patient/family education;Manual techniques;Dry needling;Passive range of motion;Spinal Manipulations;Joint Manipulations    PT Next Visit Plan KX required.  Continued neuro-re education for improved static and dynamic stability.    PT Home Exercise Plan [id number]    Consulted and Agree with Plan of Care Patient  Patient will benefit from skilled therapeutic intervention in order to improve the following deficits and impairments:  Abnormal gait,Decreased endurance,Decreased strength,Decreased activity tolerance,Decreased balance,Decreased mobility,Difficulty walking,Impaired perceived functional ability,Decreased coordination,Decreased safety awareness  Visit Diagnosis: Pain in right ankle and joints of right foot  Muscle weakness (generalized)  Difficulty in walking, not elsewhere classified     Problem List Patient Active Problem List   Diagnosis Date Noted  . Generalized anxiety disorder 01/13/2021  . Fall 04/30/2020  . Osteoarthritis, multiple sites 04/30/2020  . Pneumonia 03/11/2020  . Muscle spasm 03/10/2020  . Right flank pain 03/10/2020  . Right shoulder pain 02/08/2020  . Right hip pain 02/08/2020  . Blood loss anemia 02/08/2020  . 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 Retained orthopedic hardware   . Closed fracture of right proximal humerus 01/29/2020  . Closed right hip fracture, initial encounter ([place]) 06/22/2019  . Environmental and seasonal allergies 06/12/2018  . Dyslipidemia 08/26/2017  . IBS (irritable bowel syndrome) 08/26/2017  . Angio-edema 11/03/2016  . Diverticulosis of colon without hemorrhage 09/30/2015  . Depression, recurrent ([place]) 08/22/2007  . GERD 07/04/2007   Scot Jun, PT, DPT, OCS, ATC 04/22/21  10:59 AM    [facility] Physical Therapy [address] [place], [place], [address] Phone: [phone]   Fax:  [phone]  Name: Emily Horton MRN: [id number] Date of Birth: August 12, 1946

## 2021-04-24 ENCOUNTER — Ambulatory Visit (INDEPENDENT_AMBULATORY_CARE_PROVIDER_SITE_OTHER): Payer: Medicare Other | Admitting: Rehabilitative and Restorative Service Providers"

## 2021-04-24 ENCOUNTER — Other Ambulatory Visit: Payer: Self-pay

## 2021-04-24 ENCOUNTER — Encounter: Payer: Self-pay | Admitting: Rehabilitative and Restorative Service Providers"

## 2021-04-24 DIAGNOSIS — M6281 Muscle weakness (generalized): Secondary | ICD-10-CM

## 2021-04-24 DIAGNOSIS — M25571 Pain in right ankle and joints of right foot: Secondary | ICD-10-CM | POA: Diagnosis not present

## 2021-04-24 DIAGNOSIS — R262 Difficulty in walking, not elsewhere classified: Secondary | ICD-10-CM

## 2021-04-24 NOTE — Therapy (Signed)
[facility] Physical Therapy [address] [place], [place], [address] Phone: [phone]   Fax:  [phone]  Physical Therapy Treatment  Patient Details  Name: Emily Horton MRN: [id number] Date of Birth: 1946/08/17 Referring Provider (PT): Dr. Ninfa Linden   Encounter Date: 04/24/2021   PT End of Session - 04/24/21 1156    Visit Number 20    Number of Visits 37    Date for PT Re-Evaluation 06/19/21    Authorization Type Medicare - KX required at this time    Progress Note Due on Visit 27    PT Start Time 1146    PT Stop Time 1225    PT Time Calculation (min) 39 min    Equipment Utilized During Treatment Gait belt;Other (comment)   SPC   Activity Tolerance Patient limited by fatigue    Behavior During Therapy [facility] for tasks assessed/performed           Past Medical History:  Diagnosis Date  . Colon polyps 09-25-2007   Colonoscopy  . Depression   . Diverticulosis of colon (without mention of hemorrhage) 09-25-2007   Colonoscopy  . GERD (gastroesophageal reflux disease)   . Gynecological examination    sees Dr. Carren Rang   . Hyperlipidemia     Past Surgical History:  Procedure Laterality Date  . COLONOSCOPY  12/30/2015   per Dr. Henrene Pastor, benign  polyps, repeat in 5  yrs.   . ESOPHAGOGASTRODUODENOSCOPY  06-30-12   per Dr. Sharlett Iles, reflux but no Barretts seen   . HARDWARE REMOVAL Right 02/01/2020   Procedure: HARDWARE REMOVAL;  Surgeon: Mcarthur Rossetti, MD;  [facility];  Service: Orthopedics;  Laterality: Right;  . INTRAMEDULLARY (IM) NAIL INTERTROCHANTERIC Right 06/22/2019  . INTRAMEDULLARY (IM) NAIL INTERTROCHANTERIC Right 06/22/2019   Procedure: INTRAMEDULLARY (IM) NAIL INTERTROCHANTRIC;  Surgeon: Altamese [place], MD;  Location: [place];  Service: Orthopedics;  Laterality: Right;  . REVERSE SHOULDER ARTHROPLASTY Right 01/29/2020   Procedure: RIGHT REVERSE SHOULDER ARTHROPLASTY;  Surgeon: Marchia Bond, MD;  [facility];  Service:  Orthopedics;  Laterality: Right;  . TOTAL HIP ARTHROPLASTY Right 02/01/2020   Procedure: TOTAL HIP ARTHROPLASTY ANTERIOR APPROACH;  Surgeon: Mcarthur Rossetti, MD;  [facility];  Service: Orthopedics;  Laterality: Right;    There were no vitals filed for this visit.   Subjective Assessment - 04/24/21 1155    Subjective Pt. stated she still feels anxious with walking and isn't sure why she leans towards the walls when she gets close to them for balance.    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    Currently in Pain? No/denies                             [facility] Adult PT Treatment/Exercise - 04/24/21 0001      Neuro Re-ed    Neuro Re-ed Details  alt toe tapping c CGA 2 x 10 bilateral 6 inch step, lateral stepping 3 cones c CGA x 6 each bilateral      Knee/Hip Exercises: Aerobic   Nustep Lvl 6 15 mins      Knee/Hip Exercises: Machines for Strengthening   Cybex Leg Press single leg 43 lbs 3 x 10 bilateral      Knee/Hip Exercises: Standing   Forward Step Up Step Height: 6";15 reps;Both  Knee/Hip Exercises: Seated   Other Seated Knee/Hip Exercises seated SLR 2 x 10 bilateral    Sit to Sand without UE support;2 sets;10 reps                    PT Short Term Goals - 02/11/21 1129      PT SHORT TERM GOAL #1   Title Patient will demonstrate independent use of home exercise program to maintain progress from in clinic treatments.    Time 3    Period Weeks    Status Achieved    Target Date 02/13/21             PT Long Term Goals - 04/10/21 1236      PT LONG TERM GOAL #1   Title Patient will demonstrate independent use of home exercise program to facilitate ability to maintain/progress functional gains from skilled physical therapy services.    Time 10    Period Weeks    Status Revised    Target  Date 06/19/21      PT LONG TERM GOAL #2   Title Pt. will demonstrate bilateral hip MMT > or = 4/5 throughout to facilitate improved stabilty in ambulation, transfers.    Time 10    Period Weeks    Status Achieved      PT LONG TERM GOAL #3   Title Pt. will demonstrate BERG > 45 to indicated reduced fall risk.    Time 10    Period Weeks    Status Revised    Target Date 06/19/21      PT LONG TERM GOAL #4   Title Pt. will demonstrate TUG score < 14 seconds to facilitate reduced fall risk, improved stability in ambulation, performed c LRAD.    Time 10    Period Weeks    Status Revised    Target Date 06/19/21      PT LONG TERM GOAL #5   Title Pt. will demonstrate FOTO outcome > or = 56.    Time 10    Period Weeks    Status Revised    Target Date 06/19/21                 Plan - 04/24/21 1229    Clinical Impression Statement Rt leg weakness, noted in functional Rt hip flexion, knee extension strength evident in ambulation, specifically c fatigued.    Personal Factors and Comorbidities Other   anxiety, fear of falling, Rt THA   Examination-Activity Limitations Squat;Bend;Bed Mobility;Stairs;Stand;Transfers;Lift;Locomotion Level    Examination-Participation Restrictions Community Activity;Yard Work;Laundry;Cleaning   community integration   Stability/Clinical Decision Making Evolving/Moderate complexity    Rehab Potential Fair    PT Frequency 2x / week    PT Duration Other (comment)   10 weeks   PT Treatment/Interventions ADLs/Self Care Home Management;Cryotherapy;Electrical Stimulation;Iontophoresis 4mg /ml Dexamethasone;Moist Heat;Balance training;Therapeutic exercise;Therapeutic activities;Functional mobility training;Stair training;Gait training;DME Instruction;Ultrasound;Patient/family education;Manual techniques;Dry needling;Passive range of motion;Spinal Manipulations;Joint Manipulations    PT Next Visit Plan KX required.  Continued neuro-re education for improved static  and dynamic stability, rt hip and knee strengthening/endurance    PT Home Exercise Plan [id number]    Consulted and Agree with Plan of Care Patient           Patient will benefit from skilled therapeutic intervention in order to improve the following deficits and impairments:  Abnormal gait,Decreased endurance,Decreased strength,Decreased activity tolerance,Decreased balance,Decreased mobility,Difficulty walking,Impaired perceived functional ability,Decreased coordination,Decreased safety awareness  Visit Diagnosis: Pain in right ankle and joints of  right foot  Muscle weakness (generalized)  Difficulty in walking, not elsewhere classified     Problem List Patient Active Problem List   Diagnosis Date Noted  . Generalized anxiety disorder 01/13/2021  . Fall 04/30/2020  . Osteoarthritis, multiple sites 04/30/2020  . Pneumonia 03/11/2020  . Muscle spasm 03/10/2020  . Right flank pain 03/10/2020  . Right shoulder pain 02/08/2020  . Right hip pain 02/08/2020  . Blood loss anemia 02/08/2020  . 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 Retained orthopedic hardware   . Closed fracture of right proximal humerus 01/29/2020  . Closed right hip fracture, initial encounter ([place]) 06/22/2019  . Environmental and seasonal allergies 06/12/2018  . Dyslipidemia 08/26/2017  . IBS (irritable bowel syndrome) 08/26/2017  . Angio-edema 11/03/2016  . Diverticulosis of colon without hemorrhage 09/30/2015  . Depression, recurrent ([place]) 08/22/2007  . GERD 07/04/2007   Scot Jun, PT, DPT, OCS, ATC 04/24/21  12:31 PM    [facility] Therapy [address] [place], [place], [address] Phone: [phone]   Fax:  [phone]  Name: Emily Horton MRN: [id number] Date of Birth: 1946/06/07

## 2021-04-29 ENCOUNTER — Ambulatory Visit (INDEPENDENT_AMBULATORY_CARE_PROVIDER_SITE_OTHER): Payer: Medicare Other | Admitting: Physical Therapy

## 2021-04-29 ENCOUNTER — Other Ambulatory Visit: Payer: Self-pay

## 2021-04-29 ENCOUNTER — Encounter: Payer: Self-pay | Admitting: Physical Therapy

## 2021-04-29 DIAGNOSIS — M6281 Muscle weakness (generalized): Secondary | ICD-10-CM

## 2021-04-29 DIAGNOSIS — R262 Difficulty in walking, not elsewhere classified: Secondary | ICD-10-CM | POA: Diagnosis not present

## 2021-04-29 DIAGNOSIS — M25571 Pain in right ankle and joints of right foot: Secondary | ICD-10-CM | POA: Diagnosis not present

## 2021-04-29 NOTE — Therapy (Signed)
[facility] Physical Therapy [address] [place], [place], [address] Phone: [phone]   Fax:  [phone]  Physical Therapy Treatment  Patient Details  Name: Emily Horton MRN: [id number] Date of Birth: 1946/07/28 Referring Provider (PT): Dr. Ninfa Linden   Encounter Date: 04/29/2021   PT End of Session - 04/29/21 1136    Visit Number 21    Number of Visits 37    Date for PT Re-Evaluation 06/19/21    Authorization Type Medicare - KX required at this time    Progress Note Due on Visit 27    PT Start Time 1100    PT Stop Time 1145    PT Time Calculation (min) 45 min    Equipment Utilized During Treatment Gait belt;Other (comment)    Activity Tolerance Patient limited by fatigue    Behavior During Therapy [facility] for tasks assessed/performed           Past Medical History:  Diagnosis Date  . Colon polyps 09-25-2007   Colonoscopy  . Depression   . Diverticulosis of colon (without mention of hemorrhage) 09-25-2007   Colonoscopy  . GERD (gastroesophageal reflux disease)   . Gynecological examination    sees Dr. Carren Rang   . Hyperlipidemia     Past Surgical History:  Procedure Laterality Date  . COLONOSCOPY  12/30/2015   per Dr. Henrene Pastor, benign  polyps, repeat in 5  yrs.   . ESOPHAGOGASTRODUODENOSCOPY  06-30-12   per Dr. Sharlett Iles, reflux but no Barretts seen   . HARDWARE REMOVAL Right 02/01/2020   Procedure: HARDWARE REMOVAL;  Surgeon: Mcarthur Rossetti, MD;  [facility];  Service: Orthopedics;  Laterality: Right;  . INTRAMEDULLARY (IM) NAIL INTERTROCHANTERIC Right 06/22/2019  . INTRAMEDULLARY (IM) NAIL INTERTROCHANTERIC Right 06/22/2019   Procedure: INTRAMEDULLARY (IM) NAIL INTERTROCHANTRIC;  Surgeon: Altamese [place], MD;  Location: [place];  Service: Orthopedics;  Laterality: Right;  . REVERSE SHOULDER ARTHROPLASTY Right 01/29/2020   Procedure: RIGHT REVERSE SHOULDER ARTHROPLASTY;  Surgeon: Marchia Bond, MD;  [facility];  Service: Orthopedics;   Laterality: Right;  . TOTAL HIP ARTHROPLASTY Right 02/01/2020   Procedure: TOTAL HIP ARTHROPLASTY ANTERIOR APPROACH;  Surgeon: Mcarthur Rossetti, MD;  [facility];  Service: Orthopedics;  Laterality: Right;    There were no vitals filed for this visit.   Subjective Assessment - 04/29/21 1134    Subjective Pt arriving to therapy reporting weakness and LE fatigue. Pt also reporting more anxiety about falling. Pt entering using a straight cane.    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    Currently in Pain? No/denies                             [facility] Adult PT Treatment/Exercise - 04/29/21 0001      Neuro Re-ed    Neuro Re-ed Details  tapping toes on 2 inch step, tapping heels on 2 inch step, side stepping 20 feet x 4, high stepping backward, high stepping forward, walking up and down on ramp x 8, standing with head turns in all 4 directions with CGA,   gait belt with close supervision to CGA     Knee/Hip Exercises: Aerobic   Nustep L6 x 12 minutes      Knee/Hip Exercises: Standing   Other Standing Knee Exercises hip abduciton, hip extension x 15  each LE with single UE support, fatigue noted in LE's.      Knee/Hip Exercises: Seated   Other Seated Knee/Hip Exercises seated SLR 2 x 10 bilateral    Sit to Sand without UE support;2 sets;10 reps                    PT Short Term Goals - 04/29/21 1155      PT SHORT TERM GOAL #1   Title Patient will demonstrate independent use of home exercise program to maintain progress from in clinic treatments.    Status Achieved             PT Long Term Goals - 04/29/21 1155      PT LONG TERM GOAL #1   Title Patient will demonstrate independent use of home exercise program to facilitate ability to maintain/progress functional gains from skilled physical therapy  services.    Status On-going      PT LONG TERM GOAL #2   Title Pt. will demonstrate bilateral hip MMT > or = 4/5 throughout to facilitate improved stabilty in ambulation, transfers.    Status Achieved      PT LONG TERM GOAL #3   Title Pt. will demonstrate BERG > 45 to indicated reduced fall risk.    Status On-going      PT LONG TERM GOAL #4   Title Pt. will demonstrate TUG score < 14 seconds to facilitate reduced fall risk, improved stability in ambulation, performed c LRAD.    Status On-going      PT LONG TERM GOAL #5   Title Pt. will demonstrate FOTO outcome > or = 56.    Status On-going                 Plan - 04/29/21 1138    Clinical Impression Statement Pt tolerating exercises well. Pt still requiring constant cues to prevent from reaching for walls/wquipment/furniture to hold onto when passting. Pt with required rest breaks due to LE muscular fatigue. Focusing today on endurance with standing balance and neuromuscular re-education. Pt presenting with increased anxiety about falls. Pt stating that she has an appointment with a physchologist on Wednesday. Continue to focus on gait safety, balance, and LE strengtheing with skilled PT interventions. Considering  neurology referral if no improvements are seen after phychology appointment, due to inhibition with starting and stopping tasks, difficulty with changing directions, pt also showing improvements with rhymthetic voice cues.    Personal Factors and Comorbidities Other    Examination-Activity Limitations Squat;Bend;Bed Mobility;Stairs;Stand;Transfers;Lift;Locomotion Level    Examination-Participation Restrictions Community Activity;Yard Work;Laundry;Cleaning    Stability/Clinical Decision Making Evolving/Moderate complexity    Rehab Potential Fair    PT Frequency 2x / week    PT Duration Other (comment)    PT Treatment/Interventions ADLs/Self Care Home Management;Cryotherapy;Electrical Stimulation;Iontophoresis 4mg /ml  Dexamethasone;Moist Heat;Balance training;Therapeutic exercise;Therapeutic activities;Functional mobility training;Stair training;Gait training;DME Instruction;Ultrasound;Patient/family education;Manual techniques;Dry needling;Passive range of motion;Spinal Manipulations;Joint Manipulations    PT Next Visit Plan KX required.  Continued neuro-re education for improved static and dynamic stability, rt hip and knee strengthening/endurance    PT Home Exercise Plan [id number]    Consulted and Agree with Plan of Care Patient           Patient will benefit from skilled therapeutic intervention in order to improve the following deficits and impairments:  Abnormal gait,Decreased endurance,Decreased strength,Decreased activity tolerance,Decreased balance,Decreased mobility,Difficulty walking,Impaired perceived functional ability,Decreased coordination,Decreased safety awareness  Visit Diagnosis: Pain in right ankle and joints of right  foot  Muscle weakness (generalized)  Difficulty in walking, not elsewhere classified     Problem List Patient Active Problem List   Diagnosis Date Noted  . Generalized anxiety disorder 01/13/2021  . Fall 04/30/2020  . Osteoarthritis, multiple sites 04/30/2020  . Pneumonia 03/11/2020  . Muscle spasm 03/10/2020  . Right flank pain 03/10/2020  . Right shoulder pain 02/08/2020  . Right hip pain 02/08/2020  . Blood loss anemia 02/08/2020  . 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 Retained orthopedic hardware   . Closed fracture of right proximal humerus 01/29/2020  . Closed right hip fracture, initial encounter ([place]) 06/22/2019  . Environmental and seasonal allergies 06/12/2018  . Dyslipidemia 08/26/2017  . IBS (irritable bowel syndrome) 08/26/2017  . Angio-edema 11/03/2016  . Diverticulosis of colon without hemorrhage 09/30/2015  . Depression, recurrent ([place]) 08/22/2007  . GERD 07/04/2007    Oretha Caprice, PT, MPT 04/29/2021, 11:59 AM  [facility] Physical Therapy [address] [place], [place], [address] Phone: [phone]   Fax:  [phone]  Name: Emily Horton MRN: [id number] Date of Birth: 1946-11-12

## 2021-05-06 ENCOUNTER — Ambulatory Visit (INDEPENDENT_AMBULATORY_CARE_PROVIDER_SITE_OTHER): Payer: Medicare Other

## 2021-05-06 ENCOUNTER — Other Ambulatory Visit: Payer: Self-pay

## 2021-05-06 DIAGNOSIS — Z0000 Encounter for general adult medical examination without abnormal findings: Secondary | ICD-10-CM

## 2021-05-06 DIAGNOSIS — Z1231 Encounter for screening mammogram for malignant neoplasm of breast: Secondary | ICD-10-CM | POA: Diagnosis not present

## 2021-05-06 NOTE — Progress Notes (Signed)
Subjective:   TONAE LIVOLSI is a 75 y.o. female who presents for an Initial Medicare Annual Wellness Visit.   I connected with Robet Leu  today by telephone and verified that I am speaking with the correct person using two identifiers. Location patient: home Location provider: work Persons participating in the virtual visit: patient, provider.   I discussed the limitations, risks, security and privacy concerns of performing an evaluation and management service by telephone and the availability of in person appointments. I also discussed with the patient that there may be a patient responsible charge related to this service. The patient expressed understanding and verbally consented to this telephonic visit.    Interactive audio and video telecommunications were attempted between this provider and patient, however failed, due to patient having technical difficulties OR patient did not have access to video capability.  We continued and completed visit with audio only.     Review of Systems    n/a       Objective:    There were no vitals filed for this visit. There is no height or weight on file to calculate BMI.  Advanced Directives 01/23/2021 04/30/2020 01/30/2020 01/29/2020 01/27/2020 06/21/2019 11/02/2016  Does Patient Have a Medical Advance Directive? Yes No - Yes No No No  Type of Advance Directive Living will;Healthcare Power of [place] - - -  Does patient want to make changes to medical advance directive? - No - Patient declined No - Patient declined - - - -  Copy of [place] in Chart? - - - No - copy requested - - -  Would patient like information on creating a medical advance directive? - - - No - Patient declined No - Patient declined No - Patient declined No - Patient declined    Current Medications (verified) Outpatient Encounter Medications as of 05/06/2021  Medication Sig  . buPROPion (WELLBUTRIN XL) 300 MG 24 hr tablet  Take 1 tablet (300 mg total) by mouth every morning.  . busPIRone (BUSPAR) 15 MG tablet Take 1 tablet (15 mg total) by mouth 2 (two) times daily.  . cetirizine (ZYRTEC) 10 MG tablet TAKE 1 TABLET(10 MG) BY MOUTH DAILY  . Cholecalciferol (VITAMIN D3) 125 MCG (5000 UT) TABS Take 5,000 Units by mouth daily. (Patient not taking: Reported on 01/29/2021)  . ciprofloxacin (CIPRO) 500 MG tablet Take 1 tablet (500 mg total) by mouth 2 (two) times daily.  Marland Kitchen EPINEPHrine 0.3 mg/0.3 mL IJ SOAJ injection Inject 0.3 mLs (0.3 mg total) into the muscle daily as needed for anaphylaxis.  . ferrous sulfate 325 (65 FE) MG tablet Once A Day on Mon, Wed, Fri  . hyoscyamine (LEVSIN SL) 0.125 MG SL tablet Place 1 tablet (0.125 mg total) under the tongue every 4 (four) hours as needed.  Marland Kitchen LORazepam (ATIVAN) 0.5 MG tablet Take 1 tablet (0.5 mg total) by mouth every 8 (eight) hours as needed for anxiety. (Patient not taking: Reported on 01/29/2021)  . Menthol, Topical Analgesic, (BIOFREEZE) 4 % GEL Apply topically. APPLY TO RIGHT SHOULDER Twice A Day - PRN  . omeprazole (PRILOSEC) 40 MG capsule TAKE 1 CAPSULE BY MOUTH TWICE DAILY  . ondansetron (ZOFRAN) 4 MG tablet Take 1 tablet (4 mg total) by mouth every 8 (eight) hours as needed for nausea or vomiting.  . ondansetron (ZOFRAN) 4 MG tablet Take 1 tablet (4 mg total) by mouth every 8 (eight) hours as needed for nausea or vomiting.  Marland Kitchen  polyethylene glycol (MIRALAX / GLYCOLAX) 17 g packet Take 17 g by mouth daily as needed for moderate constipation or severe constipation.  . sennosides-docusate sodium (SENOKOT-S) 8.6-50 MG tablet Take 2 tablets by mouth daily.  Marland Kitchen triamcinolone cream (KENALOG) 0.1 % Apply 1 application topically 2 (two) times daily.   No facility-administered encounter medications on file as of 05/06/2021.    Allergies (verified) Eggs or egg-derived products and Zoloft [sertraline hcl]   History: Past Medical History:  Diagnosis Date  . Colon polyps  09-25-2007   Colonoscopy  . Depression   . Diverticulosis of colon (without mention of hemorrhage) 09-25-2007   Colonoscopy  . GERD (gastroesophageal reflux disease)   . Gynecological examination    sees Dr. Carren Rang   . Hyperlipidemia    Past Surgical History:  Procedure Laterality Date  . COLONOSCOPY  12/30/2015   per Dr. Henrene Pastor, benign  polyps, repeat in 5  yrs.   . ESOPHAGOGASTRODUODENOSCOPY  06-30-12   per Dr. Sharlett Iles, reflux but no Barretts seen   . HARDWARE REMOVAL Right 02/01/2020   Procedure: HARDWARE REMOVAL;  Surgeon: Mcarthur Rossetti, MD;  Location: [facility];  Service: Orthopedics;  Laterality: Right;  . INTRAMEDULLARY (IM) NAIL INTERTROCHANTERIC Right 06/22/2019  . INTRAMEDULLARY (IM) NAIL INTERTROCHANTERIC Right 06/22/2019   Procedure: INTRAMEDULLARY (IM) NAIL INTERTROCHANTRIC;  Surgeon: Altamese Urbana, MD;  Location: [address];  Service: Orthopedics;  Laterality: Right;  . REVERSE SHOULDER ARTHROPLASTY Right 01/29/2020   Procedure: RIGHT REVERSE SHOULDER ARTHROPLASTY;  Surgeon: Marchia Bond, MD;  Location: [facility];  Service: Orthopedics;  Laterality: Right;  . TOTAL HIP ARTHROPLASTY Right 02/01/2020   Procedure: TOTAL HIP ARTHROPLASTY ANTERIOR APPROACH;  Surgeon: Mcarthur Rossetti, MD;  Location: [facility];  Service: Orthopedics;  Laterality: Right;   Family History  Problem Relation Age of Onset  . Breast cancer Maternal Grandmother   . Crohn's disease Father   . Colon polyps Father   . Stroke Paternal Grandfather    Social History   Socioeconomic History  . Marital status: Married    Spouse name: Not on file  . Number of children: 2  . Years of education: Not on file  . Highest education level: Not on file  Occupational History  . Occupation: retired Pharmacist, hospital  Tobacco Use  . Smoking status: Current Every Day Smoker    Packs/day: 0.50    Years: 30.00    Pack years: 15.00    Types: Cigarettes  . Smokeless tobacco: Never Used  . Tobacco comment: or less   Vaping Use  . Vaping Use: Never used  Substance and Sexual Activity  . Alcohol use: Yes    Alcohol/week: 0.0 standard drinks    Comment: occasional  . Drug use: No  . Sexual activity: Not on file  Other Topics Concern  . Not on file  Social History Narrative  . Not on file   Social Determinants of Health   Financial Resource Strain: Not on file  Food Insecurity: Not on file  Transportation Needs: Not on file  Physical Activity: Not on file  Stress: Not on file  Social Connections: Not on file    Tobacco Counseling Ready to quit: Not Answered Counseling given: Not Answered Comment: or less   Clinical Intake:                 Diabetic?no         Activities of Daily Living No flowsheet data found.  Patient Care Team: Laurey Morale, MD as PCP -  General (Family Medicine)  Indicate any recent Medical Services you may have received from other than [facility] providers in the past year (date may be approximate).     Assessment:   This is a routine wellness examination for Arra.  Hearing/Vision screen No exam data present  Dietary issues and exercise activities discussed:    Goals Addressed   None    Depression Screen PHQ 2/9 Scores 06/19/2019 06/12/2018  PHQ - 2 Score 6 2  PHQ- 9 Score 20 -    Fall Risk Fall Risk  01/13/2021 06/19/2019 06/12/2018 07/30/2016  Falls in the past year? 0 1 No No  Comment - - - Emmi Telephone Survey: data to providers prior to load  Number falls in past yr: 0 1 - -  Injury with Fall? 0 0 - -  Risk for fall due to : - History of fall(s);Impaired balance/gait - -  Follow up - Falls evaluation completed - -    FALL RISK PREVENTION PERTAINING TO THE HOME:  Any stairs in or around the home? No  If so, are there any without handrails? No  Home free of loose throw rugs in walkways, pet beds, electrical cords, etc? Yes  Adequate lighting in your home to reduce risk of falls? Yes   ASSISTIVE DEVICES UTILIZED TO PREVENT  FALLS:  Life alert? No  Use of a cane, walker or w/c? Yes  Grab bars in the bathroom? Yes  Shower chair or bench in shower? Yes  Elevated toilet seat or a handicapped toilet? Yes   TIMED UP AND GO:  Cognitive Function:   Normal cognitive status assessed by direct observation by this Nurse Health Advisor. No abnormalities found.        Immunizations Immunization History  Administered Date(s) Administered  . Moderna Sars-Covid-2 Vaccination 02/29/2020  . Pneumococcal Conjugate-13 10/11/2014  . Pneumococcal Polysaccharide-23 09/19/2012  . Tdap 09/13/2011  . Zoster, Live 09/13/2011    TDAP status: Up to date  Flu Vaccine status: Up to date  Pneumococcal vaccine status: Up to date  Covid-19 vaccine status: Completed vaccines  Qualifies for Shingles Vaccine? Yes   Zostavax completed No   Shingrix Completed?: No.    Education has been provided regarding the importance of this vaccine. Patient has been advised to call insurance company to determine out of pocket expense if they have not yet received this vaccine. Advised may also receive vaccine at local pharmacy or Health Dept. Verbalized acceptance and understanding.  Screening Tests Health Maintenance  Topic Date Due  . Zoster Vaccines- Shingrix (1 of 2) Never done  . PNA vac Low Risk Adult (2 of 2 - PPSV23) 09/19/2017  . COVID-19 Vaccine (2 - Moderna 3-dose series) 03/28/2020  . COLONOSCOPY (Pts 45-100yrs Insurance coverage will need to be confirmed)  12/29/2020  . INFLUENZA VACCINE  07/06/2021  . TETANUS/TDAP  09/12/2021  . MAMMOGRAM  12/02/2021  . DEXA SCAN  Completed  . Hepatitis C Screening  Completed  . HPV VACCINES  Aged Out    Health Maintenance  Health Maintenance Due  Topic Date Due  . Zoster Vaccines- Shingrix (1 of 2) Never done  . PNA vac Low Risk Adult (2 of 2 - PPSV23) 09/19/2017  . COVID-19 Vaccine (2 - Moderna 3-dose series) 03/28/2020  . COLONOSCOPY (Pts 45-78yrs Insurance coverage will need to  be confirmed)  12/29/2020    Colorectal cancer screening: Referral to GI placed pt postponed due to surgery recovery . Pt aware the office will call re:  appt.  Mammogram status: Ordered pt has post poned due to surgery recovery . Pt provided with contact info and advised to call to schedule appt.   Bone Density status: Completed 12/03/2019. Results reflect: Bone density results: NORMAL. Repeat every 5 years.  Lung Cancer Screening: (Low Dose CT Chest recommended if Age 41-80 years, 30 pack-year currently smoking OR have quit w/in 15years.) does not qualify.   Lung Cancer Screening Referral: n/a  Additional Screening:  Hepatitis C Screening: does not qualify; Completed 10/14/2015  Vision Screening: Recommended annual ophthalmology exams for early detection of glaucoma and other disorders of the eye. Is the patient up to date with their annual eye exam?  Yes  Who is the provider or what is the name of the office in which the patient attends annual eye exams? Pt postponed due to surgery recovery  If pt is not established with a provider, would they like to be referred to a provider to establish care? No .   Dental Screening: Recommended annual dental exams for proper oral hygiene  Community Resource Referral / Chronic Care Management: CRR required this visit?  No   CCM required this visit?  No      Plan:     I have personally reviewed and noted the following in the patient's chart:   . Medical and social history . Use of alcohol, tobacco or illicit drugs  . Current medications and supplements including opioid prescriptions. Patient is not currently taking opioid prescriptions. . Functional ability and status . Nutritional status . Physical activity . Advanced directives . List of other physicians . Hospitalizations, surgeries, and ER visits in previous 12 months . Vitals . Screenings to include cognitive, depression, and falls . Referrals and appointments  In addition, I  have reviewed and discussed with patient certain preventive protocols, quality metrics, and best practice recommendations. A written personalized care plan for preventive services as well as general preventive health recommendations were provided to patient.     Randel Pigg, LPN   04/11/8615   Nurse Notes: none

## 2021-05-06 NOTE — Patient Instructions (Signed)
Emily Horton , Thank you for taking time to come for your Medicare Wellness Visit. I appreciate your ongoing commitment to your health goals. Please review the following plan we discussed and let me know if I can assist you in the future.   Screening recommendations/referrals: Colonoscopy: postponed due to surgical recovery  Mammogram: postponed due to surgical recovery  Bone Density: current due 12/02/2024 Recommended yearly ophthalmology/optometry visit for glaucoma screening and checkup Recommended yearly dental visit for hygiene and checkup  Vaccinations: Influenza vaccine: current due in fall 2022  Pneumococcal vaccine: completed series  Tdap vaccine: due 09/12/2021 Shingles vaccine: will obtain local pharmacy   Advanced directives: will provide copies   Conditions/risks identified: none   Next appointment: none    Preventive Care 15 Years and Older, Female Preventive care refers to lifestyle choices and visits with your health care provider that can promote health and wellness. What does preventive care include?  A yearly physical exam. This is also called an annual well check.  Dental exams once or twice a year.  Routine eye exams. Ask your health care provider how often you should have your eyes checked.  Personal lifestyle choices, including:  Daily care of your teeth and gums.  Regular physical activity.  Eating a healthy diet.  Avoiding tobacco and drug use.  Limiting alcohol use.  Practicing safe sex.  Taking low-dose aspirin every day.  Taking vitamin and mineral supplements as recommended by your health care provider. What happens during an annual well check? The services and screenings done by your health care provider during your annual well check will depend on your age, overall health, lifestyle risk factors, and family history of disease. Counseling  Your health care provider may ask you questions about your:  Alcohol use.  Tobacco use.  Drug  use.  Emotional well-being.  Home and relationship well-being.  Sexual activity.  Eating habits.  History of falls.  Memory and ability to understand (cognition).  Work and work Statistician.  Reproductive health. Screening  You may have the following tests or measurements:  Height, weight, and BMI.  Blood pressure.  Lipid and cholesterol levels. These may be checked every 5 years, or more frequently if you are over 30 years old.  Skin check.  Lung cancer screening. You may have this screening every year starting at age 36 if you have a 30-pack-year history of smoking and currently smoke or have quit within the past 15 years.  Fecal occult blood test (FOBT) of the stool. You may have this test every year starting at age 64.  Flexible sigmoidoscopy or colonoscopy. You may have a sigmoidoscopy every 5 years or a colonoscopy every 10 years starting at age 65.  Hepatitis C blood test.  Hepatitis B blood test.  Sexually transmitted disease (STD) testing.  Diabetes screening. This is done by checking your blood sugar (glucose) after you have not eaten for a while (fasting). You may have this done every 1-3 years.  Bone density scan. This is done to screen for osteoporosis. You may have this done starting at age 28.  Mammogram. This may be done every 1-2 years. Talk to your health care provider about how often you should have regular mammograms. Talk with your health care provider about your test results, treatment options, and if necessary, the need for more tests. Vaccines  Your health care provider may recommend certain vaccines, such as:  Influenza vaccine. This is recommended every year.  Tetanus, diphtheria, and acellular pertussis (Tdap, Td)  vaccine. You may need a Td booster every 10 years.  Zoster vaccine. You may need this after age 35.  Pneumococcal 13-valent conjugate (PCV13) vaccine. One dose is recommended after age 32.  Pneumococcal polysaccharide  (PPSV23) vaccine. One dose is recommended after age 60. Talk to your health care provider about which screenings and vaccines you need and how often you need them. This information is not intended to replace advice given to you by your health care provider. Make sure you discuss any questions you have with your health care provider. Document Released: 12/19/2015 Document Revised: 08/11/2016 Document Reviewed: 09/23/2015 Elsevier Interactive Patient Education  2017 [employer] Prevention in the Home Falls can cause injuries. They can happen to people of all ages. There are many things you can do to make your home safe and to help prevent falls. What can I do on the outside of my home?  Regularly fix the edges of walkways and driveways and fix any cracks.  Remove anything that might make you trip as you walk through a door, such as a raised step or threshold.  Trim any bushes or trees on the path to your home.  Use bright outdoor lighting.  Clear any walking paths of anything that might make someone trip, such as rocks or tools.  Regularly check to see if handrails are loose or broken. Make sure that both sides of any steps have handrails.  Any raised decks and porches should have guardrails on the edges.  Have any leaves, snow, or ice cleared regularly.  Use sand or salt on walking paths during winter.  Clean up any spills in your garage right away. This includes oil or grease spills. What can I do in the bathroom?  Use night lights.  Install grab bars by the toilet and in the tub and shower. Do not use towel bars as grab bars.  Use non-skid mats or decals in the tub or shower.  If you need to sit down in the shower, use a plastic, non-slip stool.  Keep the floor dry. Clean up any water that spills on the floor as soon as it happens.  Remove soap buildup in the tub or shower regularly.  Attach bath mats securely with double-sided non-slip rug tape.  Do not have  throw rugs and other things on the floor that can make you trip. What can I do in the bedroom?  Use night lights.  Make sure that you have a light by your bed that is easy to reach.  Do not use any sheets or blankets that are too big for your bed. They should not hang down onto the floor.  Have a firm chair that has side arms. You can use this for support while you get dressed.  Do not have throw rugs and other things on the floor that can make you trip. What can I do in the kitchen?  Clean up any spills right away.  Avoid walking on wet floors.  Keep items that you use a lot in easy-to-reach places.  If you need to reach something above you, use a strong step stool that has a grab bar.  Keep electrical cords out of the way.  Do not use floor polish or wax that makes floors slippery. If you must use wax, use non-skid floor wax.  Do not have throw rugs and other things on the floor that can make you trip. What can I do with my stairs?  Do not leave  any items on the stairs.  Make sure that there are handrails on both sides of the stairs and use them. Fix handrails that are broken or loose. Make sure that handrails are as long as the stairways.  Check any carpeting to make sure that it is firmly attached to the stairs. Fix any carpet that is loose or worn.  Avoid having throw rugs at the top or bottom of the stairs. If you do have throw rugs, attach them to the floor with carpet tape.  Make sure that you have a light switch at the top of the stairs and the bottom of the stairs. If you do not have them, ask someone to add them for you. What else can I do to help prevent falls?  Wear shoes that:  Do not have high heels.  Have rubber bottoms.  Are comfortable and fit you well.  Are closed at the toe. Do not wear sandals.  If you use a stepladder:  Make sure that it is fully opened. Do not climb a closed stepladder.  Make sure that both sides of the stepladder are  locked into place.  Ask someone to hold it for you, if possible.  Clearly mark and make sure that you can see:  Any grab bars or handrails.  First and last steps.  Where the edge of each step is.  Use tools that help you move around (mobility aids) if they are needed. These include:  Canes.  Walkers.  Scooters.  Crutches.  Turn on the lights when you go into a dark area. Replace any light bulbs as soon as they burn out.  Set up your furniture so you have a clear path. Avoid moving your furniture around.  If any of your floors are uneven, fix them.  If there are any pets around you, be aware of where they are.  Review your medicines with your doctor. Some medicines can make you feel dizzy. This can increase your chance of falling. Ask your doctor what other things that you can do to help prevent falls. This information is not intended to replace advice given to you by your health care provider. Make sure you discuss any questions you have with your health care provider. Document Released: 09/18/2009 Document Revised: 04/29/2016 Document Reviewed: 12/27/2014 Elsevier Interactive Patient Education  2017 [employer].

## 2021-05-08 ENCOUNTER — Telehealth: Payer: Self-pay

## 2021-05-08 NOTE — Telephone Encounter (Signed)
Patient is due for her Prolia injection on 06/18/21. Verification process started today.

## 2021-05-10 ENCOUNTER — Other Ambulatory Visit: Payer: Self-pay | Admitting: Family Medicine

## 2021-05-13 ENCOUNTER — Ambulatory Visit (INDEPENDENT_AMBULATORY_CARE_PROVIDER_SITE_OTHER): Payer: Medicare Other | Admitting: Rehabilitative and Restorative Service Providers"

## 2021-05-13 ENCOUNTER — Other Ambulatory Visit: Payer: Self-pay

## 2021-05-13 ENCOUNTER — Encounter: Payer: Self-pay | Admitting: Rehabilitative and Restorative Service Providers"

## 2021-05-13 ENCOUNTER — Telehealth: Payer: Self-pay | Admitting: Family Medicine

## 2021-05-13 DIAGNOSIS — M25571 Pain in right ankle and joints of right foot: Secondary | ICD-10-CM | POA: Diagnosis not present

## 2021-05-13 DIAGNOSIS — M6281 Muscle weakness (generalized): Secondary | ICD-10-CM | POA: Diagnosis not present

## 2021-05-13 DIAGNOSIS — R29898 Other symptoms and signs involving the musculoskeletal system: Secondary | ICD-10-CM

## 2021-05-13 DIAGNOSIS — R262 Difficulty in walking, not elsewhere classified: Secondary | ICD-10-CM | POA: Diagnosis not present

## 2021-05-13 NOTE — Telephone Encounter (Signed)
Patient needs a referral to a neurologist

## 2021-05-13 NOTE — Therapy (Signed)
[facility] Physical Therapy [address] [place], [place], [address] Phone: [phone]   Fax:  [phone]  Physical Therapy Treatment  Patient Details  Name: Emily Horton MRN: [id number] Date of Birth: 08-23-1946 Referring Provider (PT): Dr. Ninfa Linden   Encounter Date: 05/13/2021   PT End of Session - 05/13/21 1005    Visit Number 22    Number of Visits 37    Date for PT Re-Evaluation 06/19/21    Authorization Type Medicare - KX required at this time    Progress Note Due on Visit 27    PT Start Time 1010    PT Stop Time 1050    PT Time Calculation (min) 40 min    Equipment Utilized During Treatment Gait belt    Activity Tolerance Patient limited by fatigue    Behavior During Therapy [facility] for tasks assessed/performed           Past Medical History:  Diagnosis Date  . Colon polyps 09-25-2007   Colonoscopy  . Depression   . Diverticulosis of colon (without mention of hemorrhage) 09-25-2007   Colonoscopy  . GERD (gastroesophageal reflux disease)   . Gynecological examination    sees Dr. Carren Rang   . Hyperlipidemia     Past Surgical History:  Procedure Laterality Date  . COLONOSCOPY  12/30/2015   per Dr. Henrene Pastor, benign  polyps, repeat in 5  yrs.   . ESOPHAGOGASTRODUODENOSCOPY  06-30-12   per Dr. Sharlett Iles, reflux but no Barretts seen   . HARDWARE REMOVAL Right 02/01/2020   Procedure: HARDWARE REMOVAL;  Surgeon: Mcarthur Rossetti, MD;  Location: [facility];  Service: Orthopedics;  Laterality: Right;  . INTRAMEDULLARY (IM) NAIL INTERTROCHANTERIC Right 06/22/2019  . INTRAMEDULLARY (IM) NAIL INTERTROCHANTERIC Right 06/22/2019   Procedure: INTRAMEDULLARY (IM) NAIL INTERTROCHANTRIC;  Surgeon: Altamese [place], MD;  Location: [place];  Service: Orthopedics;  Laterality: Right;  . REVERSE SHOULDER ARTHROPLASTY Right 01/29/2020   Procedure: RIGHT REVERSE SHOULDER ARTHROPLASTY;  Surgeon: Marchia Bond, MD;  Location: [facility];  Service: Orthopedics;  Laterality: Right;   . TOTAL HIP ARTHROPLASTY Right 02/01/2020   Procedure: TOTAL HIP ARTHROPLASTY ANTERIOR APPROACH;  Surgeon: Mcarthur Rossetti, MD;  Location: [facility];  Service: Orthopedics;  Laterality: Right;    There were no vitals filed for this visit.   Subjective Assessment - 05/13/21 1018    Subjective Pt. indicated no specific pain today, doing ok.  Pt. indicated she didn't schedule any neurological specialist visit yet. Pt. indicated she is getting her confidence better at times.  Used walker outside in yard but only used it "for security."    Pertinent History Rt THA 02/05/20    Limitations Standing;Walking;House hold activities    Patient Stated Goals Wants to be "normal", go shopping, walk independent and reduce falls.    Currently in Pain? No/denies              [facility] PT Assessment - 05/13/21 0001      Assessment   Medical Diagnosis Rt ankle pain/foot pain, LE weakness    Referring Provider (PT) Dr. Ninfa Linden    Onset Date/Surgical Date 01/07/20      Transfers   Comments 18 inch chair s UE assist, fair eccentric control in lowering.      Ambulation/Gait   Gait Pattern Antalgic;Wide base of support;Poor foot clearance - right;Decreased stride length;Decreased step length - right    Gait Comments SPC use in Lt UE, improved stablity in open room ambulation but still focused on finding wall  supports to lean and grab onto.                         [facility] PT Treatment/Exercise - 05/13/21 0001      Therapeutic Activites    Other Therapeutic Activities 6 inch step up x 10 bilateral c CGA, Mod A 2 instances to prevent loss of balance, ramp ascending, descending ramp fwd, lateral x 8 each way c CGA      Neuro Re-ed    Neuro Re-ed Details  alt toe tapping 2 x 10 bilateral 6 inch, standing feet on foam c CGA head up, Lt and Rt holds 5 mins total,      Knee/Hip Exercises: Aerobic   Nustep Lvl 6 12 mins      Knee/Hip Exercises: Standing   Other Standing Knee Exercises  hip abduction, extension 2 x 10 bilateral c single arm assist on bar      Knee/Hip Exercises: Seated   Other Seated Knee/Hip Exercises seated SLR 2 x 10 bilateral                    PT Short Term Goals - 04/29/21 1155      PT SHORT TERM GOAL #1   Title Patient will demonstrate independent use of home exercise program to maintain progress from in clinic treatments.    Status Achieved             PT Long Term Goals - 04/29/21 1155      PT LONG TERM GOAL #1   Title Patient will demonstrate independent use of home exercise program to facilitate ability to maintain/progress functional gains from skilled physical therapy services.    Status On-going      PT LONG TERM GOAL #2   Title Pt. will demonstrate bilateral hip MMT > or = 4/5 throughout to facilitate improved stabilty in ambulation, transfers.    Status Achieved      PT LONG TERM GOAL #3   Title Pt. will demonstrate BERG > 45 to indicated reduced fall risk.    Status On-going      PT LONG TERM GOAL #4   Title Pt. will demonstrate TUG score < 14 seconds to facilitate reduced fall risk, improved stability in ambulation, performed c LRAD.    Status On-going      PT LONG TERM GOAL #5   Title Pt. will demonstrate FOTO outcome > or = 56.    Status On-going                 Plan - 05/13/21 1020    Clinical Impression Statement Discussed with patient idea of neurological referral seeking to include that into clinical plan of care to evaluate any possibilities of advanced neurological components to presentation.  Some indicates of improved quality in familiar balance interventions but motor planning initiation at times, particularily with fatigue are present.    Personal Factors and Comorbidities Other    Examination-Activity Limitations Squat;Bend;Bed Mobility;Stairs;Stand;Transfers;Lift;Locomotion Level    Examination-Participation Restrictions Community Activity;Yard Work;Laundry;Cleaning    Stability/Clinical  Decision Making Evolving/Moderate complexity    Rehab Potential Fair    PT Frequency 2x / week    PT Duration Other (comment)    PT Treatment/Interventions ADLs/Self Care Home Management;Cryotherapy;Electrical Stimulation;Iontophoresis 4mg /ml Dexamethasone;Moist Heat;Balance training;Therapeutic exercise;Therapeutic activities;Functional mobility training;Stair training;Gait training;DME Instruction;Ultrasound;Patient/family education;Manual techniques;Dry needling;Passive range of motion;Spinal Manipulations;Joint Manipulations    PT Next Visit Plan KX required.  Continued neuro-re education for improved  static and dynamic stability, rt hip and knee strengthening/endurance    PT Home Exercise Plan [id number]    Recommended Other Services Neurological referral possible to benefit.    Consulted and Agree with Plan of Care Patient           Patient will benefit from skilled therapeutic intervention in order to improve the following deficits and impairments:  Abnormal gait,Decreased endurance,Decreased strength,Decreased activity tolerance,Decreased balance,Decreased mobility,Difficulty walking,Impaired perceived functional ability,Decreased coordination,Decreased safety awareness  Visit Diagnosis: Pain in right ankle and joints of right foot  Muscle weakness (generalized)  Difficulty in walking, not elsewhere classified     Problem List Patient Active Problem List   Diagnosis Date Noted  . Generalized anxiety disorder 01/13/2021  . Fall 04/30/2020  . Osteoarthritis, multiple sites 04/30/2020  . Pneumonia 03/11/2020  . Muscle spasm 03/10/2020  . Right flank pain 03/10/2020  . Right shoulder pain 02/08/2020  . Right hip pain 02/08/2020  . Blood loss anemia 02/08/2020  . 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 Retained orthopedic hardware   . Closed fracture of right proximal humerus 01/29/2020  . Closed right hip fracture,  initial encounter ([place]) 06/22/2019  . Environmental and seasonal allergies 06/12/2018  . Dyslipidemia 08/26/2017  . IBS (irritable bowel syndrome) 08/26/2017  . Angio-edema 11/03/2016  . Diverticulosis of colon without hemorrhage 09/30/2015  . Depression, recurrent ([place]) 08/22/2007  . GERD 07/04/2007    Scot Jun, PT, DPT, OCS, ATC 05/13/21  11:03 AM    [facility] Physical Therapy [address] [place], [place], [address] Phone: [phone]   Fax:  [phone]  Name: JALEEAH SLIGHT MRN: [id number] Date of Birth: 1946-02-15

## 2021-05-13 NOTE — Telephone Encounter (Signed)
I did the referral 

## 2021-05-13 NOTE — Telephone Encounter (Signed)
Called spoke with patient, she states she has been taking physical therapy and the therapist has suggest that she call the office to ask for a neurology referral due to her dragging her right leg, this is the leg that she had previous surgery on.  On her left leg she states that it feels like her muscles isn't working.  She said this has been ongoing for a while now.  Please advise

## 2021-05-13 NOTE — Addendum Note (Signed)
Addended by: Alysia Penna A on: 05/13/2021 04:44 PM   Modules accepted: Orders

## 2021-05-14 ENCOUNTER — Encounter: Payer: Self-pay | Admitting: Neurology

## 2021-05-14 NOTE — Telephone Encounter (Signed)
Spoke with patient to inform referral has been sent.  

## 2021-05-15 ENCOUNTER — Encounter: Payer: Self-pay | Admitting: Rehabilitative and Restorative Service Providers"

## 2021-05-15 ENCOUNTER — Other Ambulatory Visit: Payer: Self-pay

## 2021-05-15 ENCOUNTER — Ambulatory Visit (INDEPENDENT_AMBULATORY_CARE_PROVIDER_SITE_OTHER): Payer: Medicare Other | Admitting: Rehabilitative and Restorative Service Providers"

## 2021-05-15 DIAGNOSIS — M6281 Muscle weakness (generalized): Secondary | ICD-10-CM

## 2021-05-15 DIAGNOSIS — M25571 Pain in right ankle and joints of right foot: Secondary | ICD-10-CM | POA: Diagnosis not present

## 2021-05-15 DIAGNOSIS — R262 Difficulty in walking, not elsewhere classified: Secondary | ICD-10-CM

## 2021-05-15 NOTE — Therapy (Addendum)
[facility] Physical Therapy [address] [place], [place], [address] Phone: [phone]   Fax:  [phone]  Physical Therapy Treatment/Discharge   Patient Details  Name: Emily Horton MRN: [id number] Date of Birth: 07-31-1946 Referring Provider (PT): Dr. Ninfa Linden   Encounter Date: 05/15/2021   PT End of Session - 05/15/21 1309     Visit Number 23    Number of Visits 37    Date for PT Re-Evaluation 06/19/21    Authorization Type Medicare - KX required at this time    Progress Note Due on Visit 27    PT Start Time 1303    PT Stop Time 1342    PT Time Calculation (min) 39 min    Equipment Utilized During Treatment Gait belt    Activity Tolerance Patient limited by fatigue    Behavior During Therapy [facility] for tasks assessed/performed             Past Medical History:  Diagnosis Date   Colon polyps 09-25-2007   Colonoscopy   Depression    Diverticulosis of colon (without mention of hemorrhage) 09-25-2007   Colonoscopy   GERD (gastroesophageal reflux disease)    Gynecological examination    sees Dr. Carren Rang    Hyperlipidemia     Past Surgical History:  Procedure Laterality Date   COLONOSCOPY  12/30/2015   per Dr. Henrene Pastor, benign  polyps, repeat in 5  yrs.    ESOPHAGOGASTRODUODENOSCOPY  06-30-12   per Dr. Sharlett Iles, reflux but no Barretts seen    HARDWARE REMOVAL Right 02/01/2020   Procedure: HARDWARE REMOVAL;  Surgeon: Mcarthur Rossetti, MD;  Location[facility];  Service: Orthopedics;  Laterality: Right;   INTRAMEDULLARY (IM) NAIL INTERTROCHANTERIC Right 06/22/2019   INTRAMEDULLARY (IM) NAIL INTERTROCHANTERIC Right 06/22/2019   Procedure: INTRAMEDULLARY (IM) NAIL INTERTROCHANTRIC;  Surgeon: Altamese [place], MD;  Location: [place];  Service: Orthopedics;  Laterality: Right;   REVERSE SHOULDER ARTHROPLASTY Right 01/29/2020   Procedure: RIGHT REVERSE SHOULDER ARTHROPLASTY;  Surgeon: Marchia Bond, MD;  [facility];  Service: Orthopedics;  Laterality:  Right;   TOTAL HIP ARTHROPLASTY Right 02/01/2020   Procedure: TOTAL HIP ARTHROPLASTY ANTERIOR APPROACH;  Surgeon: Mcarthur Rossetti, MD;  [facility];  Service: Orthopedics;  Laterality: Right;    There were no vitals filed for this visit.   Subjective Assessment - 05/15/21 1308     Subjective Pt. indicated no complaints of pain and indicated her neurologist viist was in August.    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    Currently in Pain? No/denies                               [facility] Adult PT Treatment/Exercise - 05/15/21 0001       Neuro Re-ed    Neuro Re-ed Details  ascending/descending ramp fwd x 10 each way, side to side x 4 each way c CGA, ambulation c CGA c head turns and up dynamic gait control 20 ft x 4, alternating heel/toe lifts for ankle stategy 2 x 10 c CGA      Knee/Hip Exercises: Aerobic   Nustep Lvl 6 15 mins      Knee/Hip Exercises: Machines for Strengthening   Cybex Leg Press double leg 75 lbs 2 x 10, single leg 43 lbs 2 x 10  bilateral      Knee/Hip Exercises: Seated   Sit to Sand 5 reps;without UE support                      PT Short Term Goals - 04/29/21 1155       PT SHORT TERM GOAL #1   Title Patient will demonstrate independent use of home exercise program to maintain progress from in clinic treatments.    Status Achieved               PT Long Term Goals - 04/29/21 1155       PT LONG TERM GOAL #1   Title Patient will demonstrate independent use of home exercise program to facilitate ability to maintain/progress functional gains from skilled physical therapy services.    Status On-going      PT LONG TERM GOAL #2   Title Pt. will demonstrate bilateral hip MMT > or = 4/5 throughout to facilitate improved stabilty in ambulation, transfers.    Status Achieved       PT LONG TERM GOAL #3   Title Pt. will demonstrate BERG > 45 to indicated reduced fall risk.    Status On-going      PT LONG TERM GOAL #4   Title Pt. will demonstrate TUG score < 14 seconds to facilitate reduced fall risk, improved stability in ambulation, performed c LRAD.    Status On-going      PT LONG TERM GOAL #5   Title Pt. will demonstrate FOTO outcome > or = 56.    Status On-going                   Plan - 05/15/21 1341     Clinical Impression Statement Attempted today to build strength/endurance into LE as well as provide dynamic gait balance control c distraction tasks to improve control in ambulation for daily activity.  Pt. continued to show medical necessity for continued skilled PT services to help progress balance control to reduce fall risk.    Personal Factors and Comorbidities Other    Examination-Activity Limitations Squat;Bend;Bed Mobility;Stairs;Stand;Transfers;Lift;Locomotion Level    Examination-Participation Restrictions Community Activity;Yard Work;Laundry;Cleaning    Stability/Clinical Decision Making Evolving/Moderate complexity    Rehab Potential Fair    PT Frequency 2x / week    PT Duration Other (comment)    PT Treatment/Interventions ADLs/Self Care Home Management;Cryotherapy;Electrical Stimulation;Iontophoresis 82m/ml Dexamethasone;Moist Heat;Balance training;Therapeutic exercise;Therapeutic activities;Functional mobility training;Stair training;Gait training;DME Instruction;Ultrasound;Patient/family education;Manual techniques;Dry needling;Passive range of motion;Spinal Manipulations;Joint Manipulations    PT Next Visit Plan KX required.  Continued neuro-re education, dynamic gait control , rt hip and knee strengthening/endurance    PT Home Exercise Plan [id number]   Consulted and Agree with Plan of Care Patient             Patient will benefit from skilled therapeutic intervention in order to improve the following deficits and  impairments:  Abnormal gait, Decreased endurance, Decreased strength, Decreased activity tolerance, Decreased balance, Decreased mobility, Difficulty walking, Impaired perceived functional ability, Decreased coordination, Decreased safety awareness  Visit Diagnosis: Pain in right ankle and joints of right foot  Muscle weakness (generalized)  Difficulty in walking, not elsewhere classified     Problem List Patient Active Problem List   Diagnosis Date Noted   Generalized anxiety disorder 01/13/2021   Fall 04/30/2020   Osteoarthritis, multiple sites 04/30/2020   Pneumonia 03/11/2020   Muscle spasm 03/10/2020   Right flank pain 03/10/2020   Right shoulder pain  02/08/2020   Right hip pain 02/08/2020   Blood loss anemia 02/08/2020   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, right    Retained orthopedic hardware    Closed fracture of right proximal humerus 01/29/2020   Closed right hip fracture, initial encounter ([place]) 06/22/2019   Environmental and seasonal allergies 06/12/2018   Dyslipidemia 08/26/2017   IBS (irritable bowel syndrome) 08/26/2017   Angio-edema 11/03/2016   Diverticulosis of colon without hemorrhage 09/30/2015   Depression, recurrent ([place]) 08/22/2007   GERD 07/04/2007   Scot Jun, PT, DPT, OCS, ATC 05/15/21  1:43 PM  PHYSICAL THERAPY DISCHARGE SUMMARY  Visits from Start of Care: 23  Current functional level related to goals / functional outcomes: See note   Remaining deficits: See note   Education / Equipment: HEP   Patient agrees to discharge. Patient goals were partially met. Patient is being discharged due to not returning since the last visit.  Scot Jun, PT, DPT, OCS, ATC 07/17/21  9:39 AM     [facility] Physical Therapy [address] [place], [place], [address] Phone: [phone]   Fax:  [phone]  Name: Emily Horton MRN: [id number] Date of Birth:  [birth year]

## 2021-05-18 ENCOUNTER — Encounter: Payer: Medicare Other | Admitting: Rehabilitative and Restorative Service Providers"

## 2021-05-20 ENCOUNTER — Encounter: Payer: Medicare Other | Admitting: Rehabilitative and Restorative Service Providers"

## 2021-06-01 ENCOUNTER — Encounter: Payer: Medicare Other | Admitting: Rehabilitative and Restorative Service Providers"

## 2021-06-03 ENCOUNTER — Encounter: Payer: Medicare Other | Admitting: Rehabilitative and Restorative Service Providers"

## 2021-06-10 ENCOUNTER — Encounter: Payer: Medicare Other | Admitting: Rehabilitative and Restorative Service Providers"

## 2021-06-18 ENCOUNTER — Ambulatory Visit (INDEPENDENT_AMBULATORY_CARE_PROVIDER_SITE_OTHER): Payer: Medicare Other

## 2021-06-18 ENCOUNTER — Other Ambulatory Visit: Payer: Self-pay

## 2021-06-18 DIAGNOSIS — M159 Polyosteoarthritis, unspecified: Secondary | ICD-10-CM

## 2021-06-18 MED ORDER — DENOSUMAB 60 MG/ML ~~LOC~~ SOSY
60.0000 mg | PREFILLED_SYRINGE | Freq: Once | SUBCUTANEOUS | Status: AC
  Administered 2021-06-18: 60 mg via SUBCUTANEOUS

## 2021-06-18 NOTE — Progress Notes (Signed)
Per orders of Dr. Sarajane Jews, injection of  Prolia (Denosumab) 60 mg/mL given by Wyvonne Lenz. Patient tolerated injection well.

## 2021-07-17 ENCOUNTER — Other Ambulatory Visit: Payer: Self-pay

## 2021-07-17 ENCOUNTER — Ambulatory Visit (INDEPENDENT_AMBULATORY_CARE_PROVIDER_SITE_OTHER): Payer: Medicare Other | Admitting: Neurology

## 2021-07-17 ENCOUNTER — Encounter: Payer: Self-pay | Admitting: Neurology

## 2021-07-17 VITALS — BP 113/72 | HR 96 | Ht 64.0 in | Wt 204.8 lb

## 2021-07-17 DIAGNOSIS — R29898 Other symptoms and signs involving the musculoskeletal system: Secondary | ICD-10-CM

## 2021-07-17 DIAGNOSIS — R292 Abnormal reflex: Secondary | ICD-10-CM

## 2021-07-17 DIAGNOSIS — M79604 Pain in right leg: Secondary | ICD-10-CM

## 2021-07-17 NOTE — Progress Notes (Signed)
[facility] Clinic Note - Initial Visit   Date: 07/17/21  Emily Horton MRN: [id number] DOB: 15-Apr-1946   Dear Dr. Sarajane Jews:  Thank you for your kind referral of Emily Horton for consultation of right leg weakness. Although her history is well known to you, please allow [place] to reiterate it for the purpose of our medical record. The patient was accompanied to the clinic by daughter who also provides collateral information.     History of Present Illness: Emily Horton is a 75 y.o. right-handed female with GERD, hyperlipidemia, anxiety and depression presenting for evaluation of right leg weakness. She had right hip replacement in 2020, which was redone in 2021.  She reports having right foot weakness and heaviness since this time.  She has been using a walker.  She has numbness over the right lateral upper thigh.  Nothing makes her symptoms better or worse.  She is very sedentary and has gained about 25-30lb over the past two years. She has been doing PT and there has been no improvement.  She also admits to having a lot of anxiety which plays into her symptoms. Her daughter has tried to help with urging her to see a counselor, but patient does not wish to.    Out-side paper records, electronic medical record, and images have been reviewed where available and summarized as:  No results found for: HGBA1C Lab Results  Component Value Date   VITAMINB12 291 05/27/2020   Lab Results  Component Value Date   TSH 2.52 06/19/2019     Past Medical History:  Diagnosis Date   Colon polyps 09-25-2007   Colonoscopy   Depression    Diverticulosis of colon (without mention of hemorrhage) 09-25-2007   Colonoscopy   GERD (gastroesophageal reflux disease)    Gynecological examination    sees Dr. Carren Rang    Hyperlipidemia     Past Surgical History:  Procedure Laterality Date   COLONOSCOPY  12/30/2015   per Dr. Henrene Pastor, benign  polyps, repeat in 5  yrs.     ESOPHAGOGASTRODUODENOSCOPY  06-30-12   per Dr. Sharlett Iles, reflux but no Barretts seen    HARDWARE REMOVAL Right 02/01/2020   Procedure: HARDWARE REMOVAL;  Surgeon: Mcarthur Rossetti, MD;  Location: [facility];  Service: Orthopedics;  Laterality: Right;   INTRAMEDULLARY (IM) NAIL INTERTROCHANTERIC Right 06/22/2019   INTRAMEDULLARY (IM) NAIL INTERTROCHANTERIC Right 06/22/2019   Procedure: INTRAMEDULLARY (IM) NAIL INTERTROCHANTRIC;  Surgeon: Altamese , MD;  Location: [facility];  Service: Orthopedics;  Laterality: Right;   REVERSE SHOULDER ARTHROPLASTY Right 01/29/2020   Procedure: RIGHT REVERSE SHOULDER ARTHROPLASTY;  Surgeon: Marchia Bond, MD;  Location: [facility];  Service: Orthopedics;  Laterality: Right;   TOTAL HIP ARTHROPLASTY Right 02/01/2020   Procedure: TOTAL HIP ARTHROPLASTY ANTERIOR APPROACH;  Surgeon: Mcarthur Rossetti, MD;  Location: [facility];  Service: Orthopedics;  Laterality: Right;     Medications:  Outpatient Encounter Medications as of 07/17/2021  Medication Sig   buPROPion (WELLBUTRIN XL) 300 MG 24 hr tablet Take 1 tablet (300 mg total) by mouth every morning.   busPIRone (BUSPAR) 15 MG tablet Take 1 tablet (15 mg total) by mouth 2 (two) times daily.   cetirizine (ZYRTEC) 10 MG tablet TAKE 1 TABLET(10 MG) BY MOUTH DAILY   EPINEPHrine 0.3 mg/0.3 mL IJ SOAJ injection Inject 0.3 mLs (0.3 mg total) into the muscle daily as needed for anaphylaxis.   FEROSUL 325 (65 Fe) MG tablet TAKE 1 TABLET BY MOUTH ON MONDAY,  WEDNESDAY, AND FRIDAY   omeprazole (PRILOSEC) 40 MG capsule TAKE 1 CAPSULE BY MOUTH TWICE DAILY   Cholecalciferol (VITAMIN D3) 125 MCG (5000 UT) TABS Take 5,000 Units by mouth daily.   ciprofloxacin (CIPRO) 500 MG tablet Take 1 tablet (500 mg total) by mouth 2 (two) times daily. (Patient not taking: Reported on 07/17/2021)   hyoscyamine (LEVSIN SL) 0.125 MG SL tablet Place 1 tablet (0.125 mg total) under the tongue every 4 (four) hours as needed. (Patient not taking:  Reported on 07/17/2021)   LORazepam (ATIVAN) 0.5 MG tablet Take 1 tablet (0.5 mg total) by mouth every 8 (eight) hours as needed for anxiety. (Patient not taking: Reported on 07/17/2021)   Menthol, Topical Analgesic, (BIOFREEZE) 4 % GEL Apply topically. APPLY TO RIGHT SHOULDER Twice A Day - PRN (Patient not taking: Reported on 07/17/2021)   ondansetron (ZOFRAN) 4 MG tablet Take 1 tablet (4 mg total) by mouth every 8 (eight) hours as needed for nausea or vomiting. (Patient not taking: Reported on 07/17/2021)   ondansetron (ZOFRAN) 4 MG tablet Take 1 tablet (4 mg total) by mouth every 8 (eight) hours as needed for nausea or vomiting. (Patient not taking: No sig reported)   polyethylene glycol (MIRALAX / GLYCOLAX) 17 g packet Take 17 g by mouth daily as needed for moderate constipation or severe constipation. (Patient not taking: Reported on 07/17/2021)   sennosides-docusate sodium (SENOKOT-S) 8.6-50 MG tablet Take 2 tablets by mouth daily. (Patient not taking: No sig reported)   triamcinolone cream (KENALOG) 0.1 % Apply 1 application topically 2 (two) times daily. (Patient not taking: Reported on 07/17/2021)   No facility-administered encounter medications on file as of 07/17/2021.    Allergies:  Allergies  Allergen Reactions   Eggs Or Egg-Derived Products     Hives    Zoloft [Sertraline Hcl]     Headaches     Family History: Family History  Problem Relation Age of Onset   Breast cancer Maternal Grandmother    Crohn's disease Father    Colon polyps Father    Stroke Paternal Grandfather     Social History: Social History   Tobacco Use   Smoking status: Every Day    Packs/day: 0.50    Years: 30.00    Pack years: 15.00    Types: Cigarettes   Smokeless tobacco: Never   Tobacco comments:    or less  Vaping Use   Vaping Use: Never used  Substance Use Topics   Alcohol use: Yes    Alcohol/week: 0.0 standard drinks    Comment: occasional   Drug use: No   Social History   Social  History Narrative   Right handed    Vital Signs:  BP 113/72   Pulse 96   Ht '5\' 4"'$  (1.626 m)   Wt 204 lb 12.8 oz (92.9 kg)   SpO2 95%   BMI 35.15 kg/m   Neurological Exam: MENTAL STATUS including orientation to time, place, person, recent and remote memory, attention span and concentration, language, and fund of knowledge is normal.  Anxious and tearful at times. Speech is not dysarthric.  CRANIAL NERVES: II:  No visual field defects.    III-IV-VI: Pupils equal round and reactive to light.  Normal conjugate, extra-ocular eye movements in all directions of gaze.  No nystagmus.  No ptosis.   V:  Normal facial sensation.    VII:  Normal facial symmetry and movements.   VIII:  Normal hearing and vestibular function.   IX-X:  Normal palatal movement.   XI:  Normal shoulder shrug and head rotation.   XII:  Normal tongue strength and range of motion, no deviation or fasciculation.  MOTOR:  No atrophy, fasciculations or abnormal movements.  No pronator drift.   Upper Extremity:  Right  Left  Deltoid  5/5   5/5   Biceps  5/5   5/5   Triceps  5/5   5/5   Infraspinatus 5/5  5/5  Medial pectoralis 5/5  5/5  Wrist extensors  5/5   5/5   Wrist flexors  5/5   5/5   Finger extensors  5/5   5/5   Finger flexors  5/5   5/5   Dorsal interossei  5/5   5/5   Abductor pollicis  5/5   5/5   Tone (Ashworth scale)  0  0   Lower Extremity:  Right  Left  Hip flexors  5/5   5/5   Hip extensors  5/5   5/5   Adductor 5/5  5/5  Abductor 5/5  5/5  Knee flexors  5/5   5/5   Knee extensors  5/5   5/5   Dorsiflexors  5/5   5/5   Plantarflexors  5/5   5/5   Toe extensors  5/5   5/5   Toe flexors  5/5   5/5   Tone (Ashworth scale)  0  0   MSRs:  Right        Left                  brachioradialis 2+  2+  biceps 2+  2+  triceps 2+  2+  patellar 0  2+  ankle jerk 2+  2+  Hoffman no  no  plantar response down  down   SENSORY:  Normal and symmetric perception of light touch, pinprick,  vibration, and proprioception.  Reduced light touch over the right lateral thigh.   COORDINATION/GAIT: Normal finger-to- nose-finger.  Intact rapid alternating movements bilaterally. Gait is assisted with a walker, slow and antalgic.    IMPRESSION: Right leg pain without improvement with PT.  Exam shows absent right patella reflex. I will order MRI lumbar spine to evaluation for lumbosacral stenosis, which may explain some of her leg symptoms.  No signs of neuropathy on her exam. Consider NCS/EMG going forward.  2.   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  3.  Generalized anxiety disorder.  Urged her to see a counselor  Further recommendations pending results.   Thank you for allowing me to participate in patient's care.  If I can answer any additional questions, I would be pleased to do so.    Sincerely,    Jaman Aro K. Posey Pronto, DO

## 2021-07-17 NOTE — Patient Instructions (Addendum)
MRI lumbar spine without contrast will be ordered and we will contact you with the results.  Recommend that you see a counselor for anxiety

## 2021-07-26 ENCOUNTER — Other Ambulatory Visit: Payer: Self-pay

## 2021-07-26 ENCOUNTER — Ambulatory Visit
Admission: RE | Admit: 2021-07-26 | Discharge: 2021-07-26 | Disposition: A | Discharge status: Home / Self Care | Payer: Medicare Other | Source: Ambulatory Visit | Attending: Neurology | Admitting: Neurology

## 2021-07-26 DIAGNOSIS — M545 Low back pain, unspecified: Secondary | ICD-10-CM | POA: Diagnosis not present

## 2021-07-26 DIAGNOSIS — M48061 Spinal stenosis, lumbar region without neurogenic claudication: Secondary | ICD-10-CM | POA: Diagnosis not present

## 2021-07-26 DIAGNOSIS — R29898 Other symptoms and signs involving the musculoskeletal system: Secondary | ICD-10-CM

## 2021-07-26 DIAGNOSIS — R292 Abnormal reflex: Secondary | ICD-10-CM

## 2021-07-28 ENCOUNTER — Telehealth: Payer: Self-pay | Admitting: Neurology

## 2021-07-28 DIAGNOSIS — R29898 Other symptoms and signs involving the musculoskeletal system: Secondary | ICD-10-CM

## 2021-07-28 DIAGNOSIS — M79604 Pain in right leg: Secondary | ICD-10-CM

## 2021-07-28 NOTE — Telephone Encounter (Signed)
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  She has already completed PT with no benefit, the next step is seeking the opinion of spine surgeon to see what her options are. If she is agreeable, please refer her to [facility]. Thanks.

## 2021-07-28 NOTE — Telephone Encounter (Signed)
Patient called for her recent MRI results she saw on MyChart, she doesn't understand them. Patient aware to await a call.

## 2021-07-28 NOTE — Telephone Encounter (Signed)
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  She has already completed PT with no benefit, the next step is seeking the opinion of spine surgeon to see what her options are. If she is agreeable, please refer her to [facility]. Pt would like for [place] to place the referral. Referral placed

## 2021-08-05 ENCOUNTER — Other Ambulatory Visit: Payer: Self-pay | Admitting: Family Medicine

## 2021-08-05 DIAGNOSIS — G959 Disease of spinal cord, unspecified: Secondary | ICD-10-CM | POA: Diagnosis not present

## 2021-08-05 DIAGNOSIS — M47816 Spondylosis without myelopathy or radiculopathy, lumbar region: Secondary | ICD-10-CM | POA: Diagnosis not present

## 2021-08-05 DIAGNOSIS — M48062 Spinal stenosis, lumbar region with neurogenic claudication: Secondary | ICD-10-CM | POA: Diagnosis not present

## 2021-08-06 ENCOUNTER — Other Ambulatory Visit: Payer: Self-pay | Admitting: Neurological Surgery

## 2021-08-06 DIAGNOSIS — G959 Disease of spinal cord, unspecified: Secondary | ICD-10-CM

## 2021-08-23 ENCOUNTER — Ambulatory Visit
Admission: RE | Admit: 2021-08-23 | Discharge: 2021-08-23 | Disposition: A | Discharge status: Home / Self Care | Payer: Medicare Other | Source: Ambulatory Visit | Attending: Neurological Surgery | Admitting: Neurological Surgery

## 2021-08-23 DIAGNOSIS — M4802 Spinal stenosis, cervical region: Secondary | ICD-10-CM | POA: Diagnosis not present

## 2021-08-23 DIAGNOSIS — G959 Disease of spinal cord, unspecified: Secondary | ICD-10-CM

## 2021-08-26 ENCOUNTER — Other Ambulatory Visit: Payer: Self-pay | Admitting: Family Medicine

## 2021-09-03 DIAGNOSIS — M48062 Spinal stenosis, lumbar region with neurogenic claudication: Secondary | ICD-10-CM | POA: Diagnosis not present

## 2021-09-03 DIAGNOSIS — R200 Anesthesia of skin: Secondary | ICD-10-CM | POA: Diagnosis not present

## 2021-09-03 DIAGNOSIS — R29898 Other symptoms and signs involving the musculoskeletal system: Secondary | ICD-10-CM | POA: Diagnosis not present

## 2021-09-09 DIAGNOSIS — R200 Anesthesia of skin: Secondary | ICD-10-CM | POA: Diagnosis not present

## 2021-09-15 ENCOUNTER — Telehealth: Payer: Self-pay

## 2021-09-15 NOTE — Telephone Encounter (Signed)
Make an OV to discuss this

## 2021-09-15 NOTE — Telephone Encounter (Signed)
Called patient left message for call back to schedule office visit appointment to discuss changes.

## 2021-09-15 NOTE — Telephone Encounter (Signed)
Patient called stating that the medications she taking for depression and anxiety are not working anymore and pt would like and medication change. Pt would like a call back

## 2021-09-15 NOTE — Telephone Encounter (Signed)
Patient is currently taking Buspirone 15mg , Bupropion XL 300mg ,  Lorazepam 0.5 mg-on 07/17/21, patient reported she wasn't currently taking this medication.  Please advise

## 2021-09-22 NOTE — Telephone Encounter (Signed)
Pt is sch for 09-25-2021

## 2021-09-25 ENCOUNTER — Ambulatory Visit: Payer: Medicare Other | Admitting: Family Medicine

## 2021-09-28 ENCOUNTER — Other Ambulatory Visit: Payer: Self-pay | Admitting: Family Medicine

## 2021-10-15 DIAGNOSIS — Z20822 Contact with and (suspected) exposure to covid-19: Secondary | ICD-10-CM | POA: Diagnosis not present

## 2021-12-11 ENCOUNTER — Ambulatory Visit (INDEPENDENT_AMBULATORY_CARE_PROVIDER_SITE_OTHER): Payer: Medicare Other | Admitting: Family Medicine

## 2021-12-11 ENCOUNTER — Encounter: Payer: Self-pay | Admitting: Family Medicine

## 2021-12-11 VITALS — BP 120/86 | HR 87 | Temp 98.7°F

## 2021-12-11 DIAGNOSIS — R300 Dysuria: Secondary | ICD-10-CM

## 2021-12-11 DIAGNOSIS — N390 Urinary tract infection, site not specified: Secondary | ICD-10-CM | POA: Diagnosis not present

## 2021-12-11 DIAGNOSIS — Z86010 Personal history of colonic polyps: Secondary | ICD-10-CM

## 2021-12-11 LAB — POC URINALSYSI DIPSTICK (AUTOMATED)
Bilirubin, UA: NEGATIVE
Blood, UA: NEGATIVE
Glucose, UA: NEGATIVE
Ketones, UA: NEGATIVE
Nitrite, UA: NEGATIVE
Protein, UA: POSITIVE — AB
Spec Grav, UA: 1.025 (ref 1.010–1.025)
Urobilinogen, UA: 0.2 E.U./dL
pH, UA: 6 (ref 5.0–8.0)

## 2021-12-11 MED ORDER — CIPROFLOXACIN HCL 500 MG PO TABS: 500.0000 mg | ORAL_TABLET | Freq: Two times a day (BID) | ORAL | 0 refills | Status: DC

## 2021-12-11 MED ORDER — OMEPRAZOLE 40 MG PO CPDR: 40.0000 mg | DELAYED_RELEASE_CAPSULE | Freq: Two times a day (BID) | ORAL | 3 refills | Status: AC

## 2021-12-11 MED ORDER — MELOXICAM 15 MG PO TABS: 15.0000 mg | ORAL_TABLET | Freq: Every day | ORAL | 3 refills | Status: DC

## 2021-12-11 NOTE — Progress Notes (Signed)
° °  Subjective:    Patient ID: Emily Horton, female    DOB: 07-13-1946, 76 y.o.   MRN: [id number]  HPI Here for one month of intermittent burning on urinations, passing dark malodorous urine, and urgency. No fever.    Review of Systems  Constitutional: Negative.   Respiratory: Negative.    Cardiovascular: Negative.   Gastrointestinal: Negative.   Genitourinary:  Positive for dysuria and urgency. Negative for flank pain and hematuria.      Objective:   Physical Exam Constitutional:      Appearance: Normal appearance. She is not ill-appearing.  Cardiovascular:     Rate and Rhythm: Normal rate and regular rhythm.     Pulses: Normal pulses.     Heart sounds: Normal heart sounds.  Pulmonary:     Effort: Pulmonary effort is normal.     Breath sounds: Normal breath sounds.  Abdominal:     General: Abdomen is flat. Bowel sounds are normal. There is no distension.     Palpations: Abdomen is soft. There is no mass.     Tenderness: There is no abdominal tenderness. There is no guarding or rebound.     Hernia: No hernia is present.  Neurological:     Mental Status: She is alert.          Assessment & Plan:  UTI, treat with Cipro. Culture the sample.  Alysia Penna, MD

## 2021-12-12 LAB — URINE CULTURE
MICRO NUMBER:: 12837637
SPECIMEN QUALITY:: ADEQUATE

## 2021-12-18 ENCOUNTER — Telehealth: Payer: Self-pay

## 2021-12-18 ENCOUNTER — Other Ambulatory Visit: Payer: Self-pay | Admitting: Family Medicine

## 2021-12-18 NOTE — Telephone Encounter (Signed)
-   pt is due for Prolia injection on or after 12/19/21. - pt is scheduled for 12/23/21 - estimated charge is $0.

## 2021-12-23 ENCOUNTER — Ambulatory Visit (INDEPENDENT_AMBULATORY_CARE_PROVIDER_SITE_OTHER): Payer: Medicare Other

## 2021-12-23 DIAGNOSIS — M159 Polyosteoarthritis, unspecified: Secondary | ICD-10-CM

## 2021-12-23 MED ORDER — DENOSUMAB 60 MG/ML ~~LOC~~ SOSY
60.0000 mg | PREFILLED_SYRINGE | Freq: Once | SUBCUTANEOUS | Status: AC
  Administered 2021-12-23: 60 mg via SUBCUTANEOUS

## 2021-12-23 NOTE — Progress Notes (Deleted)
Emily Horton is a 76 y.o. female presents to the office today for Prolia :60 mg/mL injections, per physician's orders. Original order: Prolia, 60 mg/mL,  Subcutaneous  was administered on left Arm today. Patient tolerated injection. Patient due for follow up labs/provider appt: No. Date due: 2023, appt made No Patient next injection due: 05/23/2022, appt made No  [name]

## 2021-12-23 NOTE — Progress Notes (Signed)
Emily Horton is a 76 y.o. female presents to the office today for Prolia :60 mg/mL injections, per physician's orders. Original order: Prolia, 60 mg/mL,  Subcutaneous  was administered on left Arm today. Patient tolerated injection. Patient due for follow up labs/provider appt: No. Date due: 2023, appt made No Patient next injection due: 05/23/2022, appt made No  Andee Poles Haiden Rawlinson

## 2021-12-30 ENCOUNTER — Telehealth: Payer: Self-pay | Admitting: Family Medicine

## 2021-12-30 ENCOUNTER — Other Ambulatory Visit: Payer: Self-pay

## 2021-12-30 NOTE — Telephone Encounter (Signed)
Patient called because her UTI symptoms have came back. She states that she does not feel it was fully cleared up after she had finished her medication. She would like another round to help get rid of the symptoms   Please send to  [facility], [address] AT [address] Phone:  [phone]  Fax:  [phone]        Please advise

## 2021-12-30 NOTE — Telephone Encounter (Signed)
I agree, but this time we will use a different antibiotic. Call in Bactrim DS BID for 7 days

## 2021-12-30 NOTE — Telephone Encounter (Signed)
Yes that's the double strength dose (standard)

## 2021-12-30 NOTE — Telephone Encounter (Signed)
Please advise 

## 2021-12-30 NOTE — Telephone Encounter (Signed)
Just to verify only dose noted is 800mg .   Is that the correct dosage?

## 2021-12-31 ENCOUNTER — Other Ambulatory Visit: Payer: Self-pay

## 2021-12-31 MED ORDER — SULFAMETHOXAZOLE-TRIMETHOPRIM 800-160 MG PO TABS: ORAL_TABLET | ORAL | 0 refills | Status: DC

## 2021-12-31 NOTE — Telephone Encounter (Signed)
Prescription for Bactrium DS sent to [facility].

## 2022-01-17 ENCOUNTER — Other Ambulatory Visit: Payer: Self-pay | Admitting: Family Medicine

## 2022-01-17 DIAGNOSIS — F411 Generalized anxiety disorder: Secondary | ICD-10-CM

## 2022-01-20 ENCOUNTER — Encounter: Payer: Self-pay | Admitting: Family Medicine

## 2022-01-27 ENCOUNTER — Encounter: Payer: Self-pay | Admitting: Internal Medicine

## 2022-02-16 DIAGNOSIS — Z20828 Contact with and (suspected) exposure to other viral communicable diseases: Secondary | ICD-10-CM | POA: Diagnosis not present

## 2022-02-21 DIAGNOSIS — Z20822 Contact with and (suspected) exposure to covid-19: Secondary | ICD-10-CM | POA: Diagnosis not present

## 2022-03-09 ENCOUNTER — Other Ambulatory Visit: Payer: Self-pay

## 2022-03-09 ENCOUNTER — Ambulatory Visit (AMBULATORY_SURGERY_CENTER): Payer: Medicare Other | Admitting: *Deleted

## 2022-03-09 VITALS — Ht 64.0 in | Wt 200.0 lb

## 2022-03-09 DIAGNOSIS — Z86010 Personal history of colonic polyps: Secondary | ICD-10-CM

## 2022-03-09 MED ORDER — PLENVU 140 G PO SOLR: 1.0000 | Freq: Once | ORAL | 0 refills | Status: AC

## 2022-03-09 NOTE — Progress Notes (Signed)
No egg or soy allergy known to patient  ?No issues known to pt with past sedation with any surgeries or procedures ?Patient denies ever being told they had issues or difficulty with intubation  ?No FH of Malignant Hyperthermia ?Pt is not on diet pills ?Pt is not on  home 02  ?Pt is not on blood thinners  ?Pt denies issues with constipation  ?No A fib or A flutter ? ?plenvu Coupon to pt in PV today , Code to Pharmacy and  NO PA's for preps discussed with pt In PV today  ?Discussed with pt there will be an out-of-pocket cost for prep and that varies from $0 to 70 +  dollars - pt verbalized understanding  ? ?Due to the COVID-19 pandemic we are asking patients to follow certain guidelines in PV and the [place]   ?Pt aware of COVID protocols and [facility] guidelines  ? ?PV completed over the phone. Pt verified name, DOB, address and insurance during PV today.  ?Pt mailed instruction packet with copy of consent form to read and not return, and instructions.  ?Pt encouraged to call with questions or issues.  ?If pt has My chart, procedure instructions sent via My Chart   ?

## 2022-03-10 DIAGNOSIS — Z20822 Contact with and (suspected) exposure to covid-19: Secondary | ICD-10-CM | POA: Diagnosis not present

## 2022-03-15 ENCOUNTER — Telehealth: Payer: Self-pay | Admitting: Orthopaedic Surgery

## 2022-03-23 ENCOUNTER — Encounter: Payer: Medicare Other | Admitting: Internal Medicine

## 2022-03-31 ENCOUNTER — Ambulatory Visit (INDEPENDENT_AMBULATORY_CARE_PROVIDER_SITE_OTHER): Payer: Medicare Other

## 2022-03-31 ENCOUNTER — Ambulatory Visit (INDEPENDENT_AMBULATORY_CARE_PROVIDER_SITE_OTHER): Payer: Medicare Other | Admitting: Orthopaedic Surgery

## 2022-03-31 DIAGNOSIS — Z96641 Presence of right artificial hip joint: Secondary | ICD-10-CM | POA: Diagnosis not present

## 2022-03-31 NOTE — Progress Notes (Signed)
The patient is a 76 year old female who in February 2021 underwent a right hip replacement.  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  This was addressed by one of my colleagues in town who then consulted me when she was in the hospital recovering from shoulder replacement.  At the same setting we were able to remove the hardware from her right hip and perform a hip replacement.  She does ambulate with a rolling walker.  She eventually did well with a hip replacement but now has fallen a few times.  She is ambulating with a rolling walker.  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  She says she just has no strength in her legs and she feels weak.  She does ambulate with a walker.  She is having numbness in her legs as well. ? ?An AP pelvis shows a well-seated total hip arthroplasty with no complicating features. ? ?On exam she does have weakness in her bilateral upper and lower extremities and there is also numbness and tingling more to the right than the left upper and lower. ? ?At this point I agree with at least trying some core strengthening and aquatic therapy at [facility].  They are very interested in aquatic therapy.  I would then see her back in 4 weeks.  Another step would be sending her to a spine specialist to evaluate the cervical and lumbar spine given the severe stenosis in these areas and the clinical effect is having on her. ?

## 2022-04-01 ENCOUNTER — Other Ambulatory Visit: Payer: Self-pay

## 2022-04-01 DIAGNOSIS — Z96641 Presence of right artificial hip joint: Secondary | ICD-10-CM

## 2022-04-01 DIAGNOSIS — M25571 Pain in right ankle and joints of right foot: Secondary | ICD-10-CM

## 2022-04-06 DIAGNOSIS — Z20822 Contact with and (suspected) exposure to covid-19: Secondary | ICD-10-CM | POA: Diagnosis not present

## 2022-04-11 DIAGNOSIS — Z20822 Contact with and (suspected) exposure to covid-19: Secondary | ICD-10-CM | POA: Diagnosis not present

## 2022-04-12 ENCOUNTER — Telehealth: Payer: Self-pay | Admitting: Orthopaedic Surgery

## 2022-04-12 NOTE — Telephone Encounter (Signed)
Emily Horton feels it would be easier for her to get to and from water therapy with the help of a wheelchair. She would like to know if she could get a script for a wheelchair? Please advise. ?

## 2022-04-12 NOTE — Telephone Encounter (Signed)
Patient aware this was sent to [facility] and they will deliver to her  ?

## 2022-04-15 DIAGNOSIS — Z20822 Contact with and (suspected) exposure to covid-19: Secondary | ICD-10-CM | POA: Diagnosis not present

## 2022-04-27 ENCOUNTER — Ambulatory Visit (HOSPITAL_BASED_OUTPATIENT_CLINIC_OR_DEPARTMENT_OTHER): Payer: Medicare Other | Attending: Orthopaedic Surgery | Admitting: Physical Therapy

## 2022-04-27 DIAGNOSIS — R296 Repeated falls: Secondary | ICD-10-CM | POA: Insufficient documentation

## 2022-04-27 DIAGNOSIS — M25571 Pain in right ankle and joints of right foot: Secondary | ICD-10-CM | POA: Insufficient documentation

## 2022-04-27 DIAGNOSIS — Z96641 Presence of right artificial hip joint: Secondary | ICD-10-CM | POA: Diagnosis not present

## 2022-04-27 DIAGNOSIS — R278 Other lack of coordination: Secondary | ICD-10-CM | POA: Insufficient documentation

## 2022-04-27 DIAGNOSIS — M6281 Muscle weakness (generalized): Secondary | ICD-10-CM | POA: Diagnosis not present

## 2022-04-27 DIAGNOSIS — R262 Difficulty in walking, not elsewhere classified: Secondary | ICD-10-CM

## 2022-04-27 NOTE — Therapy (Signed)
OUTPATIENT PHYSICAL THERAPY LOWER EXTREMITY EVALUATION   Patient Name: Emily Horton MRN: [id number] DOB:07-30-1946, 76 y.o., female Today's Date: 04/28/2022   PT End of Session - 04/27/22 1149     Visit Number 1    Number of Visits 12    Date for PT Re-Evaluation 06/08/22    Authorization Type Medicare A and B    PT Start Time 1025    PT Stop Time 1114    PT Time Calculation (min) 49 min    Activity Tolerance Patient tolerated treatment well    Behavior During Therapy WFL for tasks assessed/performed             Past Medical History:  Diagnosis Date   Allergy    SEASONAL   Anxiety    Arthritis    RIGHT   Blood transfusion without reported diagnosis    "when had hip surgery"   Colon polyps 09/25/2007   Colonoscopy   Depression    Diverticulosis of colon (without mention of hemorrhage) 09/25/2007   Colonoscopy   GERD (gastroesophageal reflux disease)    Gynecological examination    sees Dr. Carren Rang    Hyperlipidemia    Osteopenia    Past Surgical History:  Procedure Laterality Date   COLONOSCOPY  12/30/2015   per Dr. Henrene Pastor, benign  polyps, repeat in 5  yrs.    ESOPHAGOGASTRODUODENOSCOPY  06/30/2012   per Dr. Sharlett Iles, reflux but no Barretts seen    HARDWARE REMOVAL Right 02/01/2020   Procedure: HARDWARE REMOVAL;  Surgeon: Mcarthur Rossetti, MD;  Location: [facility];  Service: Orthopedics;  Laterality: Right;   INTRAMEDULLARY (IM) NAIL INTERTROCHANTERIC Right 06/22/2019   INTRAMEDULLARY (IM) NAIL INTERTROCHANTERIC Right 06/22/2019   Procedure: INTRAMEDULLARY (IM) NAIL INTERTROCHANTRIC;  Surgeon: Altamese [place], MD;  Location: [place];  Service: Orthopedics;  Laterality: Right;   POLYPECTOMY     REVERSE SHOULDER ARTHROPLASTY Right 01/29/2020   Procedure: RIGHT REVERSE SHOULDER ARTHROPLASTY;  Surgeon: Marchia Bond, MD;  Location: [facility];  Service: Orthopedics;  Laterality: Right;   TOTAL HIP ARTHROPLASTY Right 02/01/2020   Procedure: TOTAL HIP ARTHROPLASTY  ANTERIOR APPROACH;  Surgeon: Mcarthur Rossetti, MD;  Location: [facility];  Service: Orthopedics;  Laterality: Right;   Patient Active Problem List   Diagnosis Date Noted   Generalized anxiety disorder 01/13/2021   Fall 04/30/2020   Osteoarthritis, multiple sites 04/30/2020   Pneumonia 03/11/2020   Muscle spasm 03/10/2020   Right flank pain 03/10/2020   Right shoulder pain 02/08/2020   Right hip pain 02/08/2020   Blood loss anemia 02/08/2020   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, right    Retained orthopedic hardware    Closed fracture of right proximal humerus 01/29/2020   Closed right hip fracture, initial encounter ([place]) 06/22/2019   Environmental and seasonal allergies 06/12/2018   Dyslipidemia 08/26/2017   IBS (irritable bowel syndrome) 08/26/2017   Angio-edema 11/03/2016   Diverticulosis of colon without hemorrhage 09/30/2015   Depression, recurrent ([place]) 08/22/2007   GERD 07/04/2007     REFERRING PROVIDER: Mcarthur Rossetti, MD   REFERRING DIAG: [phone] (ICD-10-CM) - Pain in right ankle and joints of right foot             Z96.641 (ICD-10-CM) - History of right hip replacement   THERAPY DIAG:  Muscle weakness (generalized)  Difficulty in walking, not elsewhere classified  Other lack of coordination  Repeated falls  Rationale for Evaluation and Treatment Rehabilitation  ONSET DATE: MD  visit on 426/2023  SUBJECTIVE:   SUBJECTIVE STATEMENT: Pt's daughter present.  Pt tripped over a hose on 06/22/2019 and fell.  She underwent IM nailing on 06/22/19 though daughter thinks it may have been a replacement.  Pt fell taking the trash cans out in 2021.  Pt then had a reverse total shoulder arthroplasty on 01/29/2020 and  then had hip hardware removal and THR on 02/01/2020.  Pt continued to have weakness and falls.  Pt received PT from February to June in 2022.  Pt states she had some improvement in PT though has  regressed since stopping PT.    Pt is scared that she is going to fall.  Pt states she has a lot of anxiety and has trouble getting out of house sometimes.  Pt states her anxiety affects her mobility.  Pt is very limited with ambulation and is slow with ambulation.  Pt is very limited with community ambulation and uses the walker.  Pt does ambulate in home but has to hold on to external objects.  Pt is limited with ambulating to her car.  She states she can drive though hasn't driven in 2 months.  It requires pt increased time to perform household chores.  Pt does have someone to come over to clean every 1-2 months.  Pt is limited with standing duration.  Pt states she becomes dizzy while standing in the shower or if she turns too quickly.  Pt does not walk to get her mail anymore.  Pt c/o's of numbness in R thigh.  Pt states her R LE is weak and it just won't work.  She has difficulty lifting R LE.  Pt states she feels better some days.  Pt hasn't been performing her HEP except toe/heel raises.       MD order for PT included Aquatic therapy, multiple falls, severe lumbar spinal stenosis; bilateral LE strengthening   PERTINENT HISTORY: Anterior THR on 02/01/2020,  lumbar spinal stenosis and cervical stenosis, Osteopenia, anxiety and depression, Reverse total shoulder arthroplasty on 01/29/2020    PAIN:  Are you having pain? No except in shoulder.  Pt denies having pain in R ankle.   PRECAUTIONS: Other: R THR in 2021, lumbar and cervical stenosis, osteopenia, Reverse TSA in 2021  WEIGHT BEARING RESTRICTIONS No  FALLS:  Has patient fallen in last 6 months? Yes. Number of falls 3-4  LIVING ENVIRONMENT: Lives with: lives alone Lives in: 2 story home, but doesn't go up stairs Stairs:  Has following equipment at home: Single point cane, Environmental consultant - 2 wheeled, and Wheelchair (manual)  OCCUPATION: Pt is retired  PLOF: Independent.  Pt has regressed with mobility since prior PT ended.  Pt has someone  help clean her home every 1-2 months.  PATIENT GOALS to be independent again, improve walking ability   OBJECTIVE:   DIAGNOSTIC FINDINGS:  X ray of pelvis shows well-seated total hip arthroplasty with no complicating  features.  X rays in January 2022 of the right ankle show no acute findings.   Lumbar MRI in August 2022:  IMPRESSION: Multilevel lumbar disc and facet degeneration with moderate to severe spinal stenosis at L3-4 and moderate spinal stenosis at L2-3.  PATIENT SURVEYS:  FOTO 31 with a goal of 43 at visit #15  COGNITION:  Overall cognitive status: Within functional limits for tasks assessed      LOWER EXTREMITY MMT:  MMT Right eval Left eval  Hip flexion 4/5 5/5  Hip extension    Hip abduction  Hip adduction    Hip internal rotation    Hip external rotation 4-/5 4+/5  Knee flexion    Knee extension 4/5 5/5  Ankle dorsiflexion 5/5 5/5  Ankle plantarflexion WFL tested in sitting WFL Tested in sitting  Ankle inversion    Ankle eversion     (Blank rows = not tested)    FUNCTIONAL TESTS:  Timed up and go (TUG): 33 sec with FWW 5x STS:  16 sec, decreased control lowering to chair.  Pt did not use UE's. Pt able to perform without UE support  GAIT:  Assistive device utilized: Walker - 2 wheeled Comments: step to to step thru gait pattern with L LE leading; decreased step length bilat, very slow gait speed, requires walker.  Pt is very slow with turning.    TODAY'S TREATMENT: See below for pt education   PATIENT EDUCATION:  Education details: objective findings, dx, prognosis, POC, and aquatic therapy process.  Educated pt and daughter in aquatic properties, rationale of exercises, and aquatic therapy benefits.   Person educated: Patient and daughter Education method: Explanation Education comprehension: verbalized understanding   HOME EXERCISE PROGRAM: Will review her prior HEP next visit.  ASSESSMENT:  CLINICAL IMPRESSION: Patient is  a 76 y.o. female with a dx of pain in R ankle and foot and hx of R THR presenting to the clinic with LE muscle weakness, difficulty in walking, balance deficits, and repeated falls.  Pt has gait deficits and is very slow with turning.  It requires increased time with performing TUG.  She c/o' of weakness in R LE and has difficulty lifting R LE.  Pt is scared that she is going to fall and has a lot of anxiety which affects her mobility.  Pt requires the walker to ambulate and is very limited with ambulation.  She states she can drive though hasn't driven in 2 months.  Pt is limited with standing duration and requires pt increased time to perform household chores.  Pt should benefit from skilled PT services to address impairments, improve confidence with mobility, and to improve overall function.    OBJECTIVE IMPAIRMENTS Abnormal gait, decreased activity tolerance, decreased balance, decreased endurance, decreased mobility, difficulty walking, decreased strength, and pain.   ACTIVITY LIMITATIONS standing, stairs, and locomotion level  PARTICIPATION LIMITATIONS: cleaning, driving, shopping, and community activity  PERSONAL FACTORS 3+ comorbidities: lumbar spinal stenosis and cervical stenosis, Osteopenia, anxiety, Reverse total shoulder arthroplasty    are also affecting patient's functional outcome.   REHAB POTENTIAL: Good  CLINICAL DECISION MAKING: Evolving/moderate complexity  EVALUATION COMPLEXITY: Moderate   GOALS:   SHORT TERM GOALS: Target date: 05/18/2022  Pt will tolerate aquatic therapy without adverse effects for improved mobility, function, and strength.  Baseline: Goal status: INITIAL  2.  Pt will improve her TUG time by at least 10 sec for improved mobility.  Baseline:  Goal status: INITIAL  3.  Pt will report improved confidence with mobility and balance. Baseline:  Goal status: INITIAL  4.  Pt will demo improved speed with gait.  Baseline:  Goal status:  INITIAL    LONG TERM GOALS: Target date: 06/08/2022  Pt will report she is able to ambulate to her can independently without difficulty. Baseline:  Goal status: INITIAL  2.  Pt will perform TUG test in <14 sec for improved mobility and decreased fall risk. Baseline:  Goal status: INITIAL  3.  Pt will ambulate to get her mail without significant difficulty.  Baseline:  Goal status: INITIAL  4.  Pt will ambulate with a reciprocal gait with walker and report she is ambulating community distance safely, with good confidence, and without significant difficulty. Baseline:  Goal status: INITIAL  5.  Pt will demo improved strength in R hip flexion to 4+/5 and R knee extension to 5/5 MMT for improved tolerance with and performance of functional mobility.  Baseline:  Goal status: INITIAL    PLAN: PT FREQUENCY: 2x/week  PT DURATION: 6 weeks  PLANNED INTERVENTIONS: Therapeutic exercises, Therapeutic activity, Neuromuscular re-education, Balance training, Gait training, Patient/Family education, Joint mobilization, Stair training, Aquatic Therapy, Electrical stimulation, Cryotherapy, Moist heat, Manual therapy, and Re-evaluation  PLAN FOR NEXT SESSION: Aquatic therapy next visit.  Review her prior HEP next visit.   Selinda Michaels III PT, DPT 04/28/22 7:12 AM

## 2022-04-28 ENCOUNTER — Encounter (HOSPITAL_BASED_OUTPATIENT_CLINIC_OR_DEPARTMENT_OTHER): Payer: Self-pay | Admitting: Physical Therapy

## 2022-04-29 ENCOUNTER — Ambulatory Visit (HOSPITAL_BASED_OUTPATIENT_CLINIC_OR_DEPARTMENT_OTHER): Payer: Medicare Other | Admitting: Physical Therapy

## 2022-05-06 ENCOUNTER — Encounter (HOSPITAL_BASED_OUTPATIENT_CLINIC_OR_DEPARTMENT_OTHER): Payer: Self-pay | Admitting: Physical Therapy

## 2022-05-06 ENCOUNTER — Ambulatory Visit (HOSPITAL_BASED_OUTPATIENT_CLINIC_OR_DEPARTMENT_OTHER): Payer: Medicare Other | Attending: Orthopaedic Surgery | Admitting: Physical Therapy

## 2022-05-06 DIAGNOSIS — M25571 Pain in right ankle and joints of right foot: Secondary | ICD-10-CM | POA: Diagnosis not present

## 2022-05-06 DIAGNOSIS — R278 Other lack of coordination: Secondary | ICD-10-CM | POA: Insufficient documentation

## 2022-05-06 DIAGNOSIS — R262 Difficulty in walking, not elsewhere classified: Secondary | ICD-10-CM | POA: Diagnosis not present

## 2022-05-06 DIAGNOSIS — R296 Repeated falls: Secondary | ICD-10-CM | POA: Insufficient documentation

## 2022-05-06 DIAGNOSIS — M6281 Muscle weakness (generalized): Secondary | ICD-10-CM | POA: Insufficient documentation

## 2022-05-06 NOTE — Therapy (Signed)
OUTPATIENT PHYSICAL THERAPY TREATMENT NOTE   Patient Name: Emily Horton MRN: [id number] DOB:Nov 10, 1946, 76 y.o., female Today's Date: 05/06/2022    PT End of Session - 05/06/22 1203     Visit Number 2    Number of Visits 12    Date for PT Re-Evaluation 06/08/22    Authorization Type Medicare A and B    PT Start Time 1157    PT Stop Time 1238    PT Time Calculation (min) 41 min    Activity Tolerance Patient tolerated treatment well    Behavior During Therapy WFL for tasks assessed/performed             Past Medical History:  Diagnosis Date   Allergy    SEASONAL   Anxiety    Arthritis    RIGHT   Blood transfusion without reported diagnosis    "when had hip surgery"   Colon polyps 09/25/2007   Colonoscopy   Depression    Diverticulosis of colon (without mention of hemorrhage) 09/25/2007   Colonoscopy   GERD (gastroesophageal reflux disease)    Gynecological examination    sees Dr. Carren Rang    Hyperlipidemia    Osteopenia    Past Surgical History:  Procedure Laterality Date   COLONOSCOPY  12/30/2015   per Dr. Henrene Pastor, benign  polyps, repeat in 5  yrs.    ESOPHAGOGASTRODUODENOSCOPY  06/30/2012   per Dr. Sharlett Iles, reflux but no Barretts seen    HARDWARE REMOVAL Right 02/01/2020   Procedure: HARDWARE REMOVAL;  Surgeon: Mcarthur Rossetti, MD;  Location: [facility];  Service: Orthopedics;  Laterality: Right;   INTRAMEDULLARY (IM) NAIL INTERTROCHANTERIC Right 06/22/2019   INTRAMEDULLARY (IM) NAIL INTERTROCHANTERIC Right 06/22/2019   Procedure: INTRAMEDULLARY (IM) NAIL INTERTROCHANTRIC;  Surgeon: Altamese [place], MD;  Location: [place];  Service: Orthopedics;  Laterality: Right;   POLYPECTOMY     REVERSE SHOULDER ARTHROPLASTY Right 01/29/2020   Procedure: RIGHT REVERSE SHOULDER ARTHROPLASTY;  Surgeon: Marchia Bond, MD;  Location: [facility];  Service: Orthopedics;  Laterality: Right;   TOTAL HIP ARTHROPLASTY Right 02/01/2020   Procedure: TOTAL HIP ARTHROPLASTY ANTERIOR  APPROACH;  Surgeon: Mcarthur Rossetti, MD;  Location: [facility];  Service: Orthopedics;  Laterality: Right;   Patient Active Problem List   Diagnosis Date Noted   Generalized anxiety disorder 01/13/2021   Fall 04/30/2020   Osteoarthritis, multiple sites 04/30/2020   Pneumonia 03/11/2020   Muscle spasm 03/10/2020   Right flank pain 03/10/2020   Right shoulder pain 02/08/2020   Right hip pain 02/08/2020   Blood loss anemia 02/08/2020   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, right    Retained orthopedic hardware    Closed fracture of right proximal humerus 01/29/2020   Closed right hip fracture, initial encounter ([place]) 06/22/2019   Environmental and seasonal allergies 06/12/2018   Dyslipidemia 08/26/2017   IBS (irritable bowel syndrome) 08/26/2017   Angio-edema 11/03/2016   Diverticulosis of colon without hemorrhage 09/30/2015   Depression, recurrent ([place]) 08/22/2007   GERD 07/04/2007     REFERRING PROVIDER: Mcarthur Rossetti, MD   REFERRING DIAG: [phone] (ICD-10-CM) - Pain in right ankle and joints of right foot             Z96.641 (ICD-10-CM) - History of right hip replacement   THERAPY DIAG:  Muscle weakness (generalized)  Difficulty in walking, not elsewhere classified  Other lack of coordination  Repeated falls  Rationale for Evaluation and Treatment Rehabilitation  ONSET DATE: MD  visit on 426/2023  SUBJECTIVE:   SUBJECTIVE STATEMENT: Pt reports she is fearful of falling.  She has anxiety over leaving house to come to appt due to fear of falling.  She is in [facility] brought back by [employer], but a friend(?) drove her.  She states she hasn't completed her HEP from previous episode due to fear of falling.   MD order for PT included Aquatic therapy, multiple falls, severe lumbar spinal stenosis; bilateral LE strengthening   PERTINENT HISTORY: Anterior THR on 02/01/2020,  lumbar spinal stenosis and cervical  stenosis, Osteopenia, anxiety and depression, Reverse total shoulder arthroplasty on 01/29/2020    PAIN:  Are you having pain? No except in shoulder.  Pt denies having pain in R ankle.   PRECAUTIONS: Other: R THR in 2021, lumbar and cervical stenosis, osteopenia, Reverse TSA in 2021  WEIGHT BEARING RESTRICTIONS No  FALLS:  Has patient fallen in last 6 months? Yes. Number of falls 3-4  LIVING ENVIRONMENT: Lives with: lives alone Lives in: 2 story home, but doesn't go up stairs Stairs:  Has following equipment at home: Single point cane, Environmental consultant - 2 wheeled, and Wheelchair (manual)  OCCUPATION: Pt is retired  PLOF: Independent.  Pt has regressed with mobility since prior PT ended.  Pt has someone help clean her home every 1-2 months.  PATIENT GOALS to be independent again, improve walking ability   OBJECTIVE:   DIAGNOSTIC FINDINGS:  X ray of pelvis shows well-seated total hip arthroplasty with no complicating  features.  X rays in January 2022 of the right ankle show no acute findings.   Lumbar MRI in August 2022:  IMPRESSION: Multilevel lumbar disc and facet degeneration with moderate to severe spinal stenosis at L3-4 and moderate spinal stenosis at L2-3.  PATIENT SURVEYS:  FOTO 31 with a goal of 43 at visit #15  COGNITION:  Overall cognitive status: Within functional limits for tasks assessed      LOWER EXTREMITY MMT:  MMT Right eval Left eval  Hip flexion 4/5 5/5  Hip extension    Hip abduction    Hip adduction    Hip internal rotation    Hip external rotation 4-/5 4+/5  Knee flexion    Knee extension 4/5 5/5  Ankle dorsiflexion 5/5 5/5  Ankle plantarflexion WFL tested in sitting WFL Tested in sitting  Ankle inversion    Ankle eversion     (Blank rows = not tested)    FUNCTIONAL TESTS:  Timed up and go (TUG): 33 sec with FWW 5x STS:  16 sec, decreased control lowering to chair.  Pt did not use UE's. Pt able to perform without UE  support  GAIT:  Assistive device utilized: Walker - 2 wheeled Comments: step to to step thru gait pattern with L LE leading; decreased step length bilat, very slow gait speed, requires walker.  Pt is very slow with turning.    TODAY'S TREATMENT: Pt seen for aquatic therapy today.  Treatment took place in water 3.25-4 ft in depth at the [facility] pool. Temp of water was 91.  Pt entered the pool via stairs with bilat rail and SBA;  exited the pool via chair lift. She requires CGA for transfers to [facility] from chair lift.  Introduction to water Therapist in water with patient entire treatment - with CGA to close SBA.  With patient using water walker - multiple laps of forward / backward gait, cues for increased Lt step length, longer steps (instead of 1/2 steps)  and forward trunk motion (leans posteriorly);  side stepping Holding wall:  side stepping; heel raises, squats, hip ext, hip abdct, hip circles with knee flexed  Pt requires buoyancy for support and to offload joints with strengthening exercises. Viscosity of the water is needed for resistance of strengthening; water current perturbations provides challenge to standing balance unsupported, requiring increased core activation.    PATIENT EDUCATION:  Education details: objective findings, dx, prognosis, POC, and aquatic therapy process.  Educated pt and daughter in aquatic properties, rationale of exercises, and aquatic therapy benefits.   Person educated: Patient and daughter Education method: Explanation Education comprehension: verbalized understanding   HOME EXERCISE PROGRAM: Previous HEP :  [id number]  ASSESSMENT:  CLINICAL IMPRESSION: Pt guarded and cautious throughout session; requires therapist in water near her.  She has some posterior lean with gait, requiring CGA with water walker.  Mod cues to increase step length and decrease shuffling. She tolerated all exercises without any production of pain.  She requires CGA  for transfers to [facility] from chair lift, and hand held assistance of short gait outside of water.  Pt should benefit from skilled PT services to address impairments, improve confidence with mobility, and to improve overall function.    OBJECTIVE IMPAIRMENTS Abnormal gait, decreased activity tolerance, decreased balance, decreased endurance, decreased mobility, difficulty walking, decreased strength, and pain.   ACTIVITY LIMITATIONS standing, stairs, and locomotion level  PARTICIPATION LIMITATIONS: cleaning, driving, shopping, and community activity  PERSONAL FACTORS 3+ comorbidities: lumbar spinal stenosis and cervical stenosis, Osteopenia, anxiety, Reverse total shoulder arthroplasty    are also affecting patient's functional outcome.   REHAB POTENTIAL: Good  CLINICAL DECISION MAKING: Evolving/moderate complexity  EVALUATION COMPLEXITY: Moderate   GOALS:   SHORT TERM GOALS: Target date: 05/18/2022  Pt will tolerate aquatic therapy without adverse effects for improved mobility, function, and strength.  Baseline: Goal status: INITIAL  2.  Pt will improve her TUG time by at least 10 sec for improved mobility.  Baseline:  Goal status: INITIAL  3.  Pt will report improved confidence with mobility and balance. Baseline:  Goal status: INITIAL  4.  Pt will demo improved speed with gait.  Baseline:  Goal status: INITIAL    LONG TERM GOALS: Target date: 06/08/2022  Pt will report she is able to ambulate to her can independently without difficulty. Baseline:  Goal status: INITIAL  2.  Pt will perform TUG test in <14 sec for improved mobility and decreased fall risk. Baseline:  Goal status: INITIAL  3.  Pt will ambulate to get her mail without significant difficulty.  Baseline:  Goal status: INITIAL  4.  Pt will ambulate with a reciprocal gait with walker and report she is ambulating community distance safely, with good confidence, and without significant difficulty. Baseline:   Goal status: INITIAL  5.  Pt will demo improved strength in R hip flexion to 4+/5 and R knee extension to 5/5 MMT for improved tolerance with and performance of functional mobility.  Baseline:  Goal status: INITIAL    PLAN: PT FREQUENCY: 2x/week  PT DURATION: 6 weeks  PLANNED INTERVENTIONS: Therapeutic exercises, Therapeutic activity, Neuromuscular re-education, Balance training, Gait training, Patient/Family education, Joint mobilization, Stair training, Aquatic Therapy, Electrical stimulation, Cryotherapy, Moist heat, Manual therapy, and Re-evaluation  PLAN FOR NEXT SESSION: continue Aquatic therapy  Review her HEP next visit.   Kerin Perna, PTA 05/06/22 2:13 PM

## 2022-05-11 ENCOUNTER — Encounter (HOSPITAL_BASED_OUTPATIENT_CLINIC_OR_DEPARTMENT_OTHER): Payer: Self-pay | Admitting: Physical Therapy

## 2022-05-11 ENCOUNTER — Ambulatory Visit (INDEPENDENT_AMBULATORY_CARE_PROVIDER_SITE_OTHER): Payer: Medicare Other

## 2022-05-11 ENCOUNTER — Ambulatory Visit (HOSPITAL_BASED_OUTPATIENT_CLINIC_OR_DEPARTMENT_OTHER): Payer: Medicare Other | Admitting: Physical Therapy

## 2022-05-11 VITALS — Ht 64.0 in | Wt 204.0 lb

## 2022-05-11 DIAGNOSIS — R278 Other lack of coordination: Secondary | ICD-10-CM

## 2022-05-11 DIAGNOSIS — R262 Difficulty in walking, not elsewhere classified: Secondary | ICD-10-CM | POA: Diagnosis not present

## 2022-05-11 DIAGNOSIS — R296 Repeated falls: Secondary | ICD-10-CM

## 2022-05-11 DIAGNOSIS — M6281 Muscle weakness (generalized): Secondary | ICD-10-CM

## 2022-05-11 DIAGNOSIS — Z0000 Encounter for general adult medical examination without abnormal findings: Secondary | ICD-10-CM

## 2022-05-11 DIAGNOSIS — M25571 Pain in right ankle and joints of right foot: Secondary | ICD-10-CM | POA: Diagnosis not present

## 2022-05-11 NOTE — Patient Instructions (Addendum)
Emily Horton , Thank you for taking time to come for your Medicare Wellness Visit. I appreciate your ongoing commitment to your health goals. Please review the following plan we discussed and let me know if I can assist you in the future.   These are the goals we discussed:  Goals       Exercise 3x per week (30 min per time)      Patient stated (pt-stated)      I would like to walk better.        This is a list of the screening recommended for you and due dates:  Health Maintenance  Topic Date Due   COVID-19 Vaccine (2 - Moderna series) 05/27/2022*   Zoster (Shingles) Vaccine (1 of 2) 08/11/2022*   Pneumonia Vaccine (3 - PPSV23 if available, else PCV20) 05/12/2023*   Mammogram  05/12/2023*   Colon Cancer Screening  05/12/2023*   Tetanus Vaccine  05/12/2023*   Flu Shot  07/06/2022   DEXA scan (bone density measurement)  Completed   Hepatitis C Screening: USPSTF Recommendation to screen - Ages 29-79 yo.  Completed   HPV Vaccine  Aged Out  *Topic was postponed. The date shown is not the original due date.   Advanced directives: Yes Patient will submit copy  Conditions/risks identified: None  Next appointment: Follow up in one year for your annual wellness visit     Preventive Care 65 Years and Older, Female Preventive care refers to lifestyle choices and visits with your health care provider that can promote health and wellness. What does preventive care include? A yearly physical exam. This is also called an annual well check. Dental exams once or twice a year. Routine eye exams. Ask your health care provider how often you should have your eyes checked. Personal lifestyle choices, including: Daily care of your teeth and gums. Regular physical activity. Eating a healthy diet. Avoiding tobacco and drug use. Limiting alcohol use. Practicing safe sex. Taking low-dose aspirin every day. Taking vitamin and mineral supplements as recommended by your health care provider. What  happens during an annual well check? The services and screenings done by your health care provider during your annual well check will depend on your age, overall health, lifestyle risk factors, and family history of disease. Counseling  Your health care provider may ask you questions about your: Alcohol use. Tobacco use. Drug use. Emotional well-being. Home and relationship well-being. Sexual activity. Eating habits. History of falls. Memory and ability to understand (cognition). Work and work Statistician. Reproductive health. Screening  You may have the following tests or measurements: Height, weight, and BMI. Blood pressure. Lipid and cholesterol levels. These may be checked every 5 years, or more frequently if you are over 16 years old. Skin check. Lung cancer screening. You may have this screening every year starting at age 45 if you have a 30-pack-year history of smoking and currently smoke or have quit within the past 15 years. Fecal occult blood test (FOBT) of the stool. You may have this test every year starting at age 21. Flexible sigmoidoscopy or colonoscopy. You may have a sigmoidoscopy every 5 years or a colonoscopy every 10 years starting at age 76. Hepatitis C blood test. Hepatitis B blood test. Sexually transmitted disease (STD) testing. Diabetes screening. This is done by checking your blood sugar (glucose) after you have not eaten for a while (fasting). You may have this done every 1-3 years. Bone density scan. This is done to screen for osteoporosis. You may  have this done starting at age 73. Mammogram. This may be done every 1-2 years. Talk to your health care provider about how often you should have regular mammograms. Talk with your health care provider about your test results, treatment options, and if necessary, the need for more tests. Vaccines  Your health care provider may recommend certain vaccines, such as: Influenza vaccine. This is recommended every  year. Tetanus, diphtheria, and acellular pertussis (Tdap, Td) vaccine. You may need a Td booster every 10 years. Zoster vaccine. You may need this after age 33. Pneumococcal 13-valent conjugate (PCV13) vaccine. One dose is recommended after age 56. Pneumococcal polysaccharide (PPSV23) vaccine. One dose is recommended after age 60. Talk to your health care provider about which screenings and vaccines you need and how often you need them. This information is not intended to replace advice given to you by your health care provider. Make sure you discuss any questions you have with your health care provider. Document Released: 12/19/2015 Document Revised: 08/11/2016 Document Reviewed: 09/23/2015 Elsevier Interactive Patient Education  2017 [place] Prevention in the Home Falls can cause injuries. They can happen to people of all ages. There are many things you can do to make your home safe and to help prevent falls. What can I do on the outside of my home? Regularly fix the edges of walkways and driveways and fix any cracks. Remove anything that might make you trip as you walk through a door, such as a raised step or threshold. Trim any bushes or trees on the path to your home. Use bright outdoor lighting. Clear any walking paths of anything that might make someone trip, such as rocks or tools. Regularly check to see if handrails are loose or broken. Make sure that both sides of any steps have handrails. Any raised decks and porches should have guardrails on the edges. Have any leaves, snow, or ice cleared regularly. Use sand or salt on walking paths during winter. Clean up any spills in your garage right away. This includes oil or grease spills. What can I do in the bathroom? Use night lights. Install grab bars by the toilet and in the tub and shower. Do not use towel bars as grab bars. Use non-skid mats or decals in the tub or shower. If you need to sit down in the shower, use a  plastic, non-slip stool. Keep the floor dry. Clean up any water that spills on the floor as soon as it happens. Remove soap buildup in the tub or shower regularly. Attach bath mats securely with double-sided non-slip rug tape. Do not have throw rugs and other things on the floor that can make you trip. What can I do in the bedroom? Use night lights. Make sure that you have a light by your bed that is easy to reach. Do not use any sheets or blankets that are too big for your bed. They should not hang down onto the floor. Have a firm chair that has side arms. You can use this for support while you get dressed. Do not have throw rugs and other things on the floor that can make you trip. What can I do in the kitchen? Clean up any spills right away. Avoid walking on wet floors. Keep items that you use a lot in easy-to-reach places. If you need to reach something above you, use a strong step stool that has a grab bar. Keep electrical cords out of the way. Do not use floor polish  or wax that makes floors slippery. If you must use wax, use non-skid floor wax. Do not have throw rugs and other things on the floor that can make you trip. What can I do with my stairs? Do not leave any items on the stairs. Make sure that there are handrails on both sides of the stairs and use them. Fix handrails that are broken or loose. Make sure that handrails are as long as the stairways. Check any carpeting to make sure that it is firmly attached to the stairs. Fix any carpet that is loose or worn. Avoid having throw rugs at the top or bottom of the stairs. If you do have throw rugs, attach them to the floor with carpet tape. Make sure that you have a light switch at the top of the stairs and the bottom of the stairs. If you do not have them, ask someone to add them for you. What else can I do to help prevent falls? Wear shoes that: Do not have high heels. Have rubber bottoms. Are comfortable and fit you  well. Are closed at the toe. Do not wear sandals. If you use a stepladder: Make sure that it is fully opened. Do not climb a closed stepladder. Make sure that both sides of the stepladder are locked into place. Ask someone to hold it for you, if possible. Clearly mark and make sure that you can see: Any grab bars or handrails. First and last steps. Where the edge of each step is. Use tools that help you move around (mobility aids) if they are needed. These include: Canes. Walkers. Scooters. Crutches. Turn on the lights when you go into a dark area. Replace any light bulbs as soon as they burn out. Set up your furniture so you have a clear path. Avoid moving your furniture around. If any of your floors are uneven, fix them. If there are any pets around you, be aware of where they are. Review your medicines with your doctor. Some medicines can make you feel dizzy. This can increase your chance of falling. Ask your doctor what other things that you can do to help prevent falls. This information is not intended to replace advice given to you by your health care provider. Make sure you discuss any questions you have with your health care provider. Document Released: 09/18/2009 Document Revised: 04/29/2016 Document Reviewed: 12/27/2014 Elsevier Interactive Patient Education  2017 [employer].

## 2022-05-11 NOTE — Therapy (Signed)
OUTPATIENT PHYSICAL THERAPY TREATMENT NOTE   Patient Name: Emily Horton MRN: [id number] DOB:October 15, 1946, 76 y.o., female Today's Date: 05/11/2022    PT End of Session - 05/11/22 1445     Visit Number 3    Number of Visits 12    Date for PT Re-Evaluation 06/08/22    Authorization Type Medicare A and B    PT Start Time 6301    PT Stop Time 1525    PT Time Calculation (min) 39 min    Activity Tolerance Patient tolerated treatment well    Behavior During Therapy WFL for tasks assessed/performed             Past Medical History:  Diagnosis Date   Allergy    SEASONAL   Anxiety    Arthritis    RIGHT   Blood transfusion without reported diagnosis    "when had hip surgery"   Colon polyps 09/25/2007   Colonoscopy   Depression    Diverticulosis of colon (without mention of hemorrhage) 09/25/2007   Colonoscopy   GERD (gastroesophageal reflux disease)    Gynecological examination    sees Dr. Carren Rang    Hyperlipidemia    Osteopenia    Past Surgical History:  Procedure Laterality Date   COLONOSCOPY  12/30/2015   per Dr. Henrene Pastor, benign  polyps, repeat in 5  yrs.    ESOPHAGOGASTRODUODENOSCOPY  06/30/2012   per Dr. Sharlett Iles, reflux but no Barretts seen    HARDWARE REMOVAL Right 02/01/2020   Procedure: HARDWARE REMOVAL;  Surgeon: Mcarthur Rossetti, MD;  Location: [facility];  Service: Orthopedics;  Laterality: Right;   INTRAMEDULLARY (IM) NAIL INTERTROCHANTERIC Right 06/22/2019   INTRAMEDULLARY (IM) NAIL INTERTROCHANTERIC Right 06/22/2019   Procedure: INTRAMEDULLARY (IM) NAIL INTERTROCHANTRIC;  Surgeon: Altamese Barneston, MD;  Location: [facility];  Service: Orthopedics;  Laterality: Right;   POLYPECTOMY     REVERSE SHOULDER ARTHROPLASTY Right 01/29/2020   Procedure: RIGHT REVERSE SHOULDER ARTHROPLASTY;  Surgeon: Marchia Bond, MD;  Location: [facility];  Service: Orthopedics;  Laterality: Right;   TOTAL HIP ARTHROPLASTY Right 02/01/2020   Procedure: TOTAL HIP ARTHROPLASTY ANTERIOR  APPROACH;  Surgeon: Mcarthur Rossetti, MD;  Location: [facility];  Service: Orthopedics;  Laterality: Right;   Patient Active Problem List   Diagnosis Date Noted   Generalized anxiety disorder 01/13/2021   Fall 04/30/2020   Osteoarthritis, multiple sites 04/30/2020   Pneumonia 03/11/2020   Muscle spasm 03/10/2020   Right flank pain 03/10/2020   Right shoulder pain 02/08/2020   Right hip pain 02/08/2020   Blood loss anemia 02/08/2020   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, right    Retained orthopedic hardware    Closed fracture of right proximal humerus 01/29/2020   Closed right hip fracture, initial encounter ([place]) 06/22/2019   Environmental and seasonal allergies 06/12/2018   Dyslipidemia 08/26/2017   IBS (irritable bowel syndrome) 08/26/2017   Angio-edema 11/03/2016   Diverticulosis of colon without hemorrhage 09/30/2015   Depression, recurrent ([place]) 08/22/2007   GERD 07/04/2007     REFERRING PROVIDER: Mcarthur Rossetti, MD   REFERRING DIAG: [phone] (ICD-10-CM) - Pain in right ankle and joints of right foot             Z96.641 (ICD-10-CM) - History of right hip replacement   THERAPY DIAG:  Muscle weakness (generalized)  Difficulty in walking, not elsewhere classified  Other lack of coordination  Repeated falls  Rationale for Evaluation and Treatment Rehabilitation  ONSET DATE: MD  visit on 426/2023  SUBJECTIVE:   SUBJECTIVE STATEMENT: Pt reports she has been walking around house all day with walker.   Then when her daughter's friend arrives (for transportation) "I fall apart".  Pt reports she is still struggling with anxiety of leaving house.      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 No  Rating:  0/10  PRECAUTIONS: Other: R THR in  2021, lumbar and cervical stenosis, osteopenia, Reverse TSA in 2021  WEIGHT BEARING RESTRICTIONS No  FALLS:  Has patient fallen in last 6 months? Yes. Number of falls 3-4  LIVING ENVIRONMENT: Lives with: lives alone Lives in: 2 story home, but doesn't go up stairs Stairs:  Has following equipment at home: Single point cane, Environmental consultant - 2 wheeled, and Wheelchair (manual)  OCCUPATION: Pt is retired  PLOF: Independent.  Pt has regressed with mobility since prior PT ended.  Pt has someone help clean her home every 1-2 months.  PATIENT GOALS to be independent again, improve walking ability   OBJECTIVE:   DIAGNOSTIC FINDINGS:  X ray of pelvis shows well-seated total hip arthroplasty with no complicating  features.  X rays in January 2022 of the right ankle show no acute findings.   Lumbar MRI in August 2022:  IMPRESSION: Multilevel lumbar disc and facet degeneration with moderate to severe spinal stenosis at L3-4 and moderate spinal stenosis at L2-3.  PATIENT SURVEYS:  FOTO 31 with a goal of 43 at visit #15  COGNITION:  Overall cognitive status: Within functional limits for tasks assessed      LOWER EXTREMITY MMT:  MMT Right eval Left eval  Hip flexion 4/5 5/5  Hip extension    Hip abduction    Hip adduction    Hip internal rotation    Hip external rotation 4-/5 4+/5  Knee flexion    Knee extension 4/5 5/5  Ankle dorsiflexion 5/5 5/5  Ankle plantarflexion WFL tested in sitting WFL Tested in sitting  Ankle inversion    Ankle eversion     (Blank rows = not tested)    FUNCTIONAL TESTS:  Timed up and go (TUG): 33 sec with FWW 5x STS:  16 sec, decreased control lowering to chair.  Pt did not use UE's. Pt able to perform without UE support  GAIT:  Assistive device utilized: Walker - 2 wheeled Comments: step to to step thru gait pattern with L LE leading; decreased step length bilat, very slow gait speed, requires walker.  Pt is very slow with  turning.    TODAY'S TREATMENT: Pt seen for aquatic therapy today.  Treatment took place in water 3.25-4 ft in depth at the [facility] pool. Temp of water was 91.  Pt entered/exited the pool via stairs with bilat rail and SBA/CGA.  She requires CGA for transfers to [facility] from chair lift.  Introduction to water Therapist in water with patient entire treatment - with CGA to close SBA.  With patient using water walker -> barbell  - multiple laps of forward / backward gait and side stepping, cues for increased Rt step length, longer steps (instead of 1/2 steps) and forward trunk motion (leans posteriorly);   Holding wall:  heel raises, squats, hip flex/ext, hip abdct/add R/L gastroc stretch holding wall with tactile cues for form  Seated on bench with blue step under feet - STS with HHA  to SBA with cues for forward weight shift Forward step ups with RLE x 10, with LLE x 10 Calf stretch with heels off of step (limited)   Pt requires buoyancy for support and to offload joints with strengthening exercises. Viscosity of the water is needed for resistance of strengthening; water current perturbations provides challenge to standing balance unsupported, requiring increased core activation.    PATIENT EDUCATION:  Education details: objective findings, dx, prognosis, POC, and aquatic therapy process.  Educated pt and daughter in aquatic properties, rationale of exercises, and aquatic therapy benefits.   Person educated: Patient and daughter Education method: Explanation Education comprehension: verbalized understanding   HOME EXERCISE PROGRAM: Previous HEP :  [id number]  ASSESSMENT:  CLINICAL IMPRESSION: Pt requires therapist in water near her.  She continues some posterior lean with gait, requiring CGA/close SBA with water walker/barbell.  Mod cues to increase step length and decrease shuffling. She tolerated all exercises without any production of pain. Overall improved movement in water  today.  She requires CGA for transfers to [facility], and hand held assistance of short gait outside of water. Limited carry over of gait quality once on land again.  Pt should benefit from skilled PT services to address impairments, improve confidence with mobility, and to improve overall function.    OBJECTIVE IMPAIRMENTS Abnormal gait, decreased activity tolerance, decreased balance, decreased endurance, decreased mobility, difficulty walking, decreased strength, and pain.   ACTIVITY LIMITATIONS standing, stairs, and locomotion level  PARTICIPATION LIMITATIONS: cleaning, driving, shopping, and community activity  PERSONAL FACTORS 3+ comorbidities: lumbar spinal stenosis and cervical stenosis, Osteopenia, anxiety, Reverse total shoulder arthroplasty    are also affecting patient's functional outcome.   REHAB POTENTIAL: Good  CLINICAL DECISION MAKING: Evolving/moderate complexity  EVALUATION COMPLEXITY: Moderate   GOALS:   SHORT TERM GOALS: Target date: 05/18/2022  Pt will tolerate aquatic therapy without adverse effects for improved mobility, function, and strength.  Baseline: Goal status: INITIAL  2.  Pt will improve her TUG time by at least 10 sec for improved mobility.  Baseline:  Goal status: INITIAL  3.  Pt will report improved confidence with mobility and balance. Baseline:  Goal status: INITIAL  4.  Pt will demo improved speed with gait.  Baseline:  Goal status: INITIAL    LONG TERM GOALS: Target date: 06/08/2022  Pt will report she is able to ambulate to her can independently without difficulty. Baseline:  Goal status: INITIAL  2.  Pt will perform TUG test in <14 sec for improved mobility and decreased fall risk. Baseline:  Goal status: INITIAL  3.  Pt will ambulate to get her mail without significant difficulty.  Baseline:  Goal status: INITIAL  4.  Pt will ambulate with a reciprocal gait with walker and report she is ambulating community distance safely, with  good confidence, and without significant difficulty. Baseline:  Goal status: INITIAL  5.  Pt will demo improved strength in R hip flexion to 4+/5 and R knee extension to 5/5 MMT for improved tolerance with and performance of functional mobility.  Baseline:  Goal status: INITIAL    PLAN: PT FREQUENCY: 2x/week  PT DURATION: 6 weeks  PLANNED INTERVENTIONS: Therapeutic exercises, Therapeutic activity, Neuromuscular re-education, Balance training, Gait training, Patient/Family education, Joint mobilization, Stair training, Aquatic Therapy, Electrical stimulation, Cryotherapy, Moist heat, Manual therapy, and Re-evaluation  PLAN FOR NEXT SESSION: continue Aquatic therapy  Review her HEP next land visit.  Kerin Perna, PTA 05/11/22 3:54 PM

## 2022-05-11 NOTE — Progress Notes (Signed)
Subjective:   Emily Horton is a 76 y.o. female who presents for Medicare Annual (Subsequent) preventive examination.  Review of Systems    Virtual Visit via Telephone Note  I connected with  [name] on 05/11/22 at 12:30 PM EDT by telephone and verified that I am speaking with the correct person using two identifiers.  Location: Patient: Home Provider: Office Persons participating in the virtual visit: patient/Nurse Health Advisor   I discussed the limitations, risks, security and privacy concerns of performing an evaluation and management service by telephone and the availability of in person appointments. The patient expressed understanding and agreed to proceed.  Interactive audio and video telecommunications were attempted between this nurse and patient, however failed, due to patient having technical difficulties OR patient did not have access to video capability.  We continued and completed visit with audio only.  Some vital signs may be absent or patient reported.   Criselda Peaches, LPN  Cardiac Risk Factors include: advanced age (>27mn, >>19women)     Objective:    Today's Vitals   05/11/22 1228  Weight: 204 lb (92.5 kg)  Height: '5\' 4"'$  (1.626 m)   Body mass index is 35.02 kg/m.     05/11/2022   12:53 PM 04/27/2022   10:54 AM 07/17/2021   11:15 AM 05/06/2021   11:21 AM 01/23/2021   11:06 AM 04/30/2020    1:16 PM 01/30/2020   12:00 AM  Advanced Directives  Does Patient Have a Medical Advance Directive? Yes Yes No Yes Yes No   Type of AParamed[place]of [place]  [place] Living will;Healthcare Power of Attorney    Does patient want to make changes to medical advance directive? No - Patient declined     No - Patient declined No - Patient declined  Copy of [place] Chart? No - copy requested   No - copy requested       Current Medications  (verified) Outpatient Encounter Medications as of 05/11/2022  Medication Sig   buPROPion (WELLBUTRIN XL) 300 MG 24 hr tablet TAKE 1 TABLET(300 MG) BY MOUTH EVERY MORNING   busPIRone (BUSPAR) 15 MG tablet TAKE 1 TABLET(15 MG) BY MOUTH TWICE DAILY   cetirizine (ZYRTEC) 10 MG tablet TAKE 1 TABLET(10 MG) BY MOUTH DAILY   EPINEPHrine 0.3 mg/0.3 mL IJ SOAJ injection Inject 0.3 mLs (0.3 mg total) into the muscle daily as needed for anaphylaxis. (Patient not taking: Reported on 03/09/2022)   FEROSUL 325 (65 Fe) MG tablet TAKE 1 TABLET BY MOUTH ON MONDAY, WEDNESDAY, AND FRIDAY (Patient not taking: Reported on 03/09/2022)   hyoscyamine (LEVSIN SL) 0.125 MG SL tablet Place 1 tablet (0.125 mg total) under the tongue every 4 (four) hours as needed. (Patient not taking: Reported on 03/09/2022)   LORazepam (ATIVAN) 0.5 MG tablet Take 1 tablet (0.5 mg total) by mouth every 8 (eight) hours as needed for anxiety. (Patient not taking: Reported on 03/09/2022)   meloxicam (MOBIC) 15 MG tablet Take 1 tablet (15 mg total) by mouth daily. (Patient taking differently: Take 15 mg by mouth every other day.)   Menthol, Topical Analgesic, (BIOFREEZE) 4 % GEL Apply topically. APPLY TO RIGHT SHOULDER Twice A Day - PRN (Patient not taking: Reported on 03/09/2022)   omeprazole (PRILOSEC) 40 MG capsule Take 1 capsule (40 mg total) by mouth 2 (two) times daily.   ondansetron (ZOFRAN) 4 MG tablet Take  1 tablet (4 mg total) by mouth every 8 (eight) hours as needed for nausea or vomiting.   polyethylene glycol (MIRALAX / GLYCOLAX) 17 g packet Take 17 g by mouth daily as needed for moderate constipation or severe constipation.   sennosides-docusate sodium (SENOKOT-S) 8.6-50 MG tablet Take 2 tablets by mouth daily. (Patient not taking: Reported on 03/09/2022)   triamcinolone cream (KENALOG) 0.1 % Apply 1 application topically 2 (two) times daily. (Patient not taking: Reported on 03/09/2022)   No facility-administered encounter medications on file as of  05/11/2022.    Allergies (verified) Eggs or egg-derived products and Zoloft [sertraline hcl]   History: Past Medical History:  Diagnosis Date   Allergy    SEASONAL   Anxiety    Arthritis    RIGHT   Blood transfusion without reported diagnosis    "when had hip surgery"   Colon polyps 09/25/2007   Colonoscopy   Depression    Diverticulosis of colon (without mention of hemorrhage) 09/25/2007   Colonoscopy   GERD (gastroesophageal reflux disease)    Gynecological examination    sees Dr. Carren Rang    Hyperlipidemia    Osteopenia    Past Surgical History:  Procedure Laterality Date   COLONOSCOPY  12/30/2015   per Dr. Henrene Pastor, benign  polyps, repeat in 5  yrs.    ESOPHAGOGASTRODUODENOSCOPY  06/30/2012   per Dr. Sharlett Iles, reflux but no Barretts seen    HARDWARE REMOVAL Right 02/01/2020   Procedure: HARDWARE REMOVAL;  Surgeon: Mcarthur Rossetti, MD;  Location: [facility];  Service: Orthopedics;  Laterality: Right;   INTRAMEDULLARY (IM) NAIL INTERTROCHANTERIC Right 06/22/2019   INTRAMEDULLARY (IM) NAIL INTERTROCHANTERIC Right 06/22/2019   Procedure: INTRAMEDULLARY (IM) NAIL INTERTROCHANTRIC;  Surgeon: Altamese Taylor, MD;  Location: [facility];  Service: Orthopedics;  Laterality: Right;   POLYPECTOMY     REVERSE SHOULDER ARTHROPLASTY Right 01/29/2020   Procedure: RIGHT REVERSE SHOULDER ARTHROPLASTY;  Surgeon: Marchia Bond, MD;  Location: [facility];  Service: Orthopedics;  Laterality: Right;   TOTAL HIP ARTHROPLASTY Right 02/01/2020   Procedure: TOTAL HIP ARTHROPLASTY ANTERIOR APPROACH;  Surgeon: Mcarthur Rossetti, MD;  Location: [facility];  Service: Orthopedics;  Laterality: Right;   Family History  Problem Relation Age of Onset   Crohn's disease Father    Colon polyps Father    Breast cancer Maternal Grandmother    Stroke Paternal Grandfather    Colon cancer Neg Hx    Esophageal cancer Neg Hx    Rectal cancer Neg Hx    Stomach cancer Neg Hx    Social History   Socioeconomic  History   Marital status: Married    Spouse name: Not on file   Number of children: 2   Years of education: Not on file   Highest education level: Not on file  Occupational History   Occupation: retired Pharmacist, hospital  Tobacco Use   Smoking status: Every Day    Packs/day: 0.50    Years: 30.00    Pack years: 15.00    Types: Cigarettes    Passive exposure: Past   Smokeless tobacco: Never   Tobacco comments:    or less  Vaping Use   Vaping Use: Never used  Substance and Sexual Activity   Alcohol use: Yes    Alcohol/week: 0.0 standard drinks    Comment: occasional   Drug use: No   Sexual activity: Not on file  Other Topics Concern   Not on file  Social History Narrative   Right handed  Social Determinants of Health   Financial Resource Strain: Low Risk    Difficulty of Paying Living Expenses: Not hard at all  Food Insecurity: No Food Insecurity   Worried About Charity fundraiser in the Last Year: Never true   [place] in the Last Year: Never true  Transportation Needs: No Transportation Needs   Lack of Transportation (Medical): No   Lack of Transportation (Non-Medical): No  Physical Activity: Inactive   Days of Exercise per Week: 0 days   Minutes of Exercise per Session: 0 min  Stress: No Stress Concern Present   Feeling of Stress : Not at all  Social Connections: Moderately Integrated   Frequency of Communication with Friends and Family: More than three times a week   Frequency of Social Gatherings with Friends and Family: More than three times a week   Attends Religious Services: More than 4 times per year   Active Member of [employer] or Organizations: Yes   Attends Archivist Meetings: 1 to 4 times per year   Marital Status: Widowed    Tobacco Counseling Ready to quit: Not Answered Counseling given: Not Answered Tobacco comments: or less   Clinical Intake:   Diabetic?  No   Activities of Daily Living    05/11/2022   12:48 PM  In your present  state of health, do you have any difficulty performing the following activities:  Hearing? 0  Vision? 0  Difficulty concentrating or making decisions? 0  Walking or climbing stairs? 1  Comment Hip Surgery  Dressing or bathing? 0  Doing errands, shopping? 0  Preparing Food and eating ? N  Using the Toilet? N  In the past six months, have you accidently leaked urine? N  Do you have problems with loss of bowel control? N  Managing your Medications? N  Managing your Finances? N  Housekeeping or managing your Housekeeping? N    Patient Care Team: Laurey Morale, MD as PCP - General (Family Medicine)  Indicate any recent Medical Services you may have received from other than [facility] providers in the past year (date may be approximate).     Assessment:   This is a routine wellness examination for Ovida.  Hearing/Vision screen Hearing Screening - Comments:: No difficulty hearing Vision Screening - Comments:: Wears glasses. Followed by Dr Ninfa Linden  Dietary issues and exercise activities discussed: Exercise limited by: orthopedic condition(s);Other - see comments (Hip Surgery)   Goals Addressed               This Visit's Progress     Patient stated (pt-stated)        I would like to walk better.       Depression Screen    05/11/2022   12:35 PM 12/11/2021    1:12 PM 05/06/2021   11:23 AM 05/06/2021   11:20 AM 06/19/2019   10:39 AM 06/12/2018    1:09 PM  PHQ 2/9 Scores  PHQ - 2 Score 2 5 0 0 6 2  PHQ- 9 Score '7 23   20     '$ Fall Risk    05/11/2022   12:51 PM 12/11/2021    1:12 PM 07/17/2021   11:15 AM 05/06/2021   11:22 AM 01/13/2021    1:53 PM  Fall Risk   Falls in the past year? 1 1 0 0 0  Number falls in past yr: 1 0 0 0 0  Injury with Fall? 0 1 0 0 0  Comment No injury or medical attention needed      Risk for fall due to : Orthopedic patient   Impaired balance/gait     FALL RISK PREVENTION PERTAINING TO THE HOME:  Any stairs in or around the home? Yes  If so, are there  any without handrails? No  Home free of loose throw rugs in walkways, pet beds, electrical cords, etc? Yes  Adequate lighting in your home to reduce risk of falls? Yes   ASSISTIVE DEVICES UTILIZED TO PREVENT FALLS:  Life alert? Yes  Use of a cane, walker or w/c? Yes  Grab bars in the bathroom? Yes  Shower chair or bench in shower? Yes  Elevated toilet seat or a handicapped toilet? No   TIMED UP AND GO:  Was the test performed? No . Audio Visit  Cognitive Function:    Immunizations Immunization History  Administered Date(s) Administered   Moderna Sars-Covid-2 Vaccination 02/29/2020   Pneumococcal Conjugate-13 10/11/2014   Pneumococcal Polysaccharide-23 09/19/2012   Tdap 09/13/2011   Zoster, Live 09/13/2011    TDAP status: Due, Education has been provided regarding the importance of this vaccine. Advised may receive this vaccine at local pharmacy or Health Dept. Aware to provide a copy of the vaccination record if obtained from local pharmacy or Health Dept. Verbalized acceptance and understanding.  Flu Vaccine status: Declined, Education has been provided regarding the importance of this vaccine but patient still declined. Advised may receive this vaccine at local pharmacy or Health Dept. Aware to provide a copy of the vaccination record if obtained from local pharmacy or Health Dept. Verbalized acceptance and understanding.  Pneumococcal vaccine status: Due, Education has been provided regarding the importance of this vaccine. Advised may receive this vaccine at local pharmacy or Health Dept. Aware to provide a copy of the vaccination record if obtained from local pharmacy or Health Dept. Verbalized acceptance and understanding.  Covid-19 vaccine status: Declined, Education has been provided regarding the importance of this vaccine but patient still declined. Advised may receive this vaccine at local pharmacy or Health Dept.or vaccine clinic. Aware to provide a copy of the  vaccination record if obtained from local pharmacy or Health Dept. Verbalized acceptance and understanding.  Qualifies for Shingles Vaccine? Yes   Zostavax completed No   Shingrix Completed?: No.    Education has been provided regarding the importance of this vaccine. Patient has been advised to call insurance company to determine out of pocket expense if they have not yet received this vaccine. Advised may also receive vaccine at local pharmacy or Health Dept. Verbalized acceptance and understanding.  Screening Tests Health Maintenance  Topic Date Due   COVID-19 Vaccine (2 - Moderna series) 05/27/2022 (Originally 03/28/2020)   Zoster Vaccines- Shingrix (1 of 2) 08/11/2022 (Originally 07/12/1998)   Pneumonia Vaccine 16+ Years old (3 - PPSV23 if available, else PCV20) 05/12/2023 (Originally 09/19/2017)   MAMMOGRAM  05/12/2023 (Originally 12/02/2020)   COLONOSCOPY (Pts 45-62yr Insurance coverage will need to be confirmed)  05/12/2023 (Originally 12/29/2020)   TETANUS/TDAP  05/12/2023 (Originally 09/12/2021)   INFLUENZA VACCINE  07/06/2022   DEXA SCAN  Completed   Hepatitis C Screening  Completed   HPV VACCINES  Aged Out    Health Maintenance  There are no preventive care reminders to display for this patient.   Colorectal cancer screening: Referral to GI placed Patient deferred. Pt aware the office will call re: appt.  Mammogram status: Ordered Patient deferred. Pt provided with contact info and advised to call  to schedule appt.   Bone Density status: Completed 12/03/19. Results reflect: Bone density results: OSTEOPOROSIS. Repeat every 2 years.  Lung Cancer Screening: (Low Dose CT Chest recommended if Age 77-80 years, 30 pack-year currently smoking OR have quit w/in 15years.) does not qualify.     Additional Screening:  Hepatitis C Screening: does qualify; Completed 10/14/15  Vision Screening: Recommended annual ophthalmology exams for early detection of glaucoma and other disorders  of the eye. Is the patient up to date with their annual eye exam?  Yes  Who is the provider or what is the name of the office in which the patient attends annual eye exams? Dr Ninfa Linden If pt is not established with a provider, would they like to be referred to a provider to establish care? No .   Dental Screening: Recommended annual dental exams for proper oral hygiene  Community Resource Referral / Chronic Care Management:  CRR required this visit?  No   CCM required this visit?  No      Plan:     I have personally reviewed and noted the following in the patient's chart:   Medical and social history Use of alcohol, tobacco or illicit drugs  Current medications and supplements including opioid prescriptions.  Functional ability and status Nutritional status Physical activity Advanced directives List of other physicians Hospitalizations, surgeries, and ER visits in previous 12 months Vitals Screenings to include cognitive, depression, and falls Referrals and appointments  In addition, I have reviewed and discussed with patient certain preventive protocols, quality metrics, and best practice recommendations. A written personalized care plan for preventive services as well as general preventive health recommendations were provided to patient.     Criselda Peaches, LPN   06/12/6753   Nurse Notes: Patient request f/u for PCP advised referred personal counseling and medication for increased anxiety. Patient stated she has no current thoughts or plans of suicide. Patient stated unsatisfied with previous counselors referred.

## 2022-05-18 ENCOUNTER — Ambulatory Visit: Payer: Medicare Other | Admitting: Family Medicine

## 2022-05-19 ENCOUNTER — Ambulatory Visit: Payer: Medicare Other | Admitting: Family Medicine

## 2022-05-21 ENCOUNTER — Ambulatory Visit (INDEPENDENT_AMBULATORY_CARE_PROVIDER_SITE_OTHER): Payer: Medicare Other | Admitting: Family Medicine

## 2022-05-21 ENCOUNTER — Encounter: Payer: Self-pay | Admitting: Family Medicine

## 2022-05-21 VITALS — BP 110/78 | HR 63 | Temp 98.1°F | Wt 199.2 lb

## 2022-05-21 DIAGNOSIS — N6314 Unspecified lump in the right breast, lower inner quadrant: Secondary | ICD-10-CM | POA: Diagnosis not present

## 2022-05-21 NOTE — Progress Notes (Signed)
   Subjective:    Patient ID: Emily Horton, female    DOB: [birth year], 76 y.o.   MRN: [id number]  HPI Here to check a lump in the right breast that she found 5 days ago while in the shower. It is not sore and does not bother her in any way. Her last mammogram in 2020 was normal.   Review of Systems  Constitutional: Negative.   Respiratory: Negative.    Cardiovascular: Negative.        Objective:   Physical Exam Constitutional:      Appearance: Normal appearance.     Comments: Using a walker   Cardiovascular:     Rate and Rhythm: Normal rate and regular rhythm.     Pulses: Normal pulses.     Heart sounds: Normal heart sounds.  Pulmonary:     Effort: Pulmonary effort is normal.     Breath sounds: Normal breath sounds.     Comments: The right breast has a mildly tender firm well marginated mobile mass about 3 cm from the nipple at the 5 o'clock position. The axilla is clear  Neurological:     Mental Status: She is alert.           Assessment & Plan:  Right breast lump. We will arrange for her to get a diagnostic bilateral mammogram and a right breast US.  Alysia Penna, MD u

## 2022-05-24 ENCOUNTER — Encounter (HOSPITAL_BASED_OUTPATIENT_CLINIC_OR_DEPARTMENT_OTHER): Payer: Self-pay | Admitting: Physical Therapy

## 2022-05-24 ENCOUNTER — Ambulatory Visit (HOSPITAL_BASED_OUTPATIENT_CLINIC_OR_DEPARTMENT_OTHER): Payer: Medicare Other | Admitting: Physical Therapy

## 2022-05-24 DIAGNOSIS — R278 Other lack of coordination: Secondary | ICD-10-CM

## 2022-05-24 DIAGNOSIS — M25571 Pain in right ankle and joints of right foot: Secondary | ICD-10-CM

## 2022-05-24 DIAGNOSIS — R296 Repeated falls: Secondary | ICD-10-CM

## 2022-05-24 DIAGNOSIS — R262 Difficulty in walking, not elsewhere classified: Secondary | ICD-10-CM | POA: Diagnosis not present

## 2022-05-24 DIAGNOSIS — M6281 Muscle weakness (generalized): Secondary | ICD-10-CM | POA: Diagnosis not present

## 2022-05-24 NOTE — Therapy (Signed)
OUTPATIENT PHYSICAL THERAPY TREATMENT NOTE   Patient Name: Emily Horton MRN: [id number] DOB:[birth year], 76 y.o., female Today's Date: 05/24/2022   PT End of Session -     Visit Number 4   Number of Visits 12   Date for PT Re-Evaluation 06/08/22   Authorization Type [employer]   PT Start Time  1610   PT Stop Time 1655   PT Time Calculation (min) 45 min   Activity Tolerance Patient tolerated treatment well   Behavior During Therapy  WFL for tasks assessed/performed      Past Medical History:  Diagnosis Date   Allergy    SEASONAL   Anxiety    Arthritis    RIGHT   Blood transfusion without reported diagnosis    "when had hip surgery"   Colon polyps 09/25/2007   Colonoscopy   Depression    Diverticulosis of colon (without mention of hemorrhage) 09/25/2007   Colonoscopy   GERD (gastroesophageal reflux disease)    Gynecological examination    sees Dr. Carren Rang    Hyperlipidemia    Osteopenia    Past Surgical History:  Procedure Laterality Date   COLONOSCOPY  12/30/2015   per Dr. Henrene Pastor, benign  polyps, repeat in 5  yrs.    ESOPHAGOGASTRODUODENOSCOPY  06/30/2012   per Dr. Sharlett Iles, reflux but no Barretts seen    HARDWARE REMOVAL Right 02/01/2020   Procedure: HARDWARE REMOVAL;  Surgeon: Mcarthur Rossetti, MD;  Location: [facility];  Service: Orthopedics;  Laterality: Right;   INTRAMEDULLARY (IM) NAIL INTERTROCHANTERIC Right 06/22/2019   INTRAMEDULLARY (IM) NAIL INTERTROCHANTERIC Right 06/22/2019   Procedure: INTRAMEDULLARY (IM) NAIL INTERTROCHANTRIC;  Surgeon: Altamese Newtonia, MD;  Location: [facility];  Service: Orthopedics;  Laterality: Right;   POLYPECTOMY     REVERSE SHOULDER ARTHROPLASTY Right 01/29/2020   Procedure: RIGHT REVERSE SHOULDER ARTHROPLASTY;  Surgeon: Marchia Bond, MD;  Location: [facility];  Service: Orthopedics;  Laterality: Right;   TOTAL HIP ARTHROPLASTY Right 02/01/2020   Procedure: TOTAL HIP ARTHROPLASTY ANTERIOR APPROACH;  Surgeon: Mcarthur Rossetti,  MD;  Location: [facility];  Service: Orthopedics;  Laterality: Right;   Patient Active Problem List   Diagnosis Date Noted   Generalized anxiety disorder 01/13/2021   Fall 04/30/2020   Osteoarthritis, multiple sites 04/30/2020   Pneumonia 03/11/2020   Muscle spasm 03/10/2020   Right flank pain 03/10/2020   Right shoulder pain 02/08/2020   Right hip pain 02/08/2020   Blood loss anemia 02/08/2020   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, right    Retained orthopedic hardware    Closed fracture of right proximal humerus 01/29/2020   Closed right hip fracture, initial encounter ([place]) 06/22/2019   Environmental and seasonal allergies 06/12/2018   Dyslipidemia 08/26/2017   IBS (irritable bowel syndrome) 08/26/2017   Angio-edema 11/03/2016   Diverticulosis of colon without hemorrhage 09/30/2015   Depression, recurrent ([place]) 08/22/2007   GERD 07/04/2007     REFERRING PROVIDER: Mcarthur Rossetti, MD   REFERRING DIAG: [phone] (ICD-10-CM) - Pain in right ankle and joints of right foot             Z96.641 (ICD-10-CM) - History of right hip replacement   THERAPY DIAG:  No diagnosis found.  Rationale for Evaluation and Treatment Rehabilitation  ONSET DATE: MD visit on 426/2023  SUBJECTIVE:   SUBJECTIVE STATEMENT: Pt reports good response from last visit.  A little nervous today but not bad     PERTINENT HISTORY: Anterior  THR on 02/01/2020,  lumbar spinal stenosis and cervical stenosis, Osteopenia, anxiety and depression, Reverse total shoulder arthroplasty on 01/29/2020    PAIN:  Are you having pain? No  Rating:  0/10  PRECAUTIONS: Other: R THR in 2021, lumbar and cervical stenosis, osteopenia, Reverse TSA in 2021  WEIGHT BEARING RESTRICTIONS No  FALLS:  Has patient fallen in last 6 months? Yes. Number of falls 3-4  LIVING ENVIRONMENT: Lives with: lives alone Lives in: 2 story home, but doesn't go up stairs Stairs:  Has  following equipment at home: Single point cane, Environmental consultant - 2 wheeled, and Wheelchair (manual)  OCCUPATION: Pt is retired  PLOF: Independent.  Pt has regressed with mobility since prior PT ended.  Pt has someone help clean her home every 1-2 months.  PATIENT GOALS to be independent again, improve walking ability   OBJECTIVE:   DIAGNOSTIC FINDINGS:  X ray of pelvis shows well-seated total hip arthroplasty with no complicating  features.  X rays in January 2022 of the right ankle show no acute findings.   Lumbar MRI in August 2022:  IMPRESSION: Multilevel lumbar disc and facet degeneration with moderate to severe spinal stenosis at L3-4 and moderate spinal stenosis at L2-3.  PATIENT SURVEYS:  FOTO 31 with a goal of 43 at visit #15  COGNITION:  Overall cognitive status: Within functional limits for tasks assessed      LOWER EXTREMITY MMT:  MMT Right eval Left eval  Hip flexion 4/5 5/5  Hip extension    Hip abduction    Hip adduction    Hip internal rotation    Hip external rotation 4-/5 4+/5  Knee flexion    Knee extension 4/5 5/5  Ankle dorsiflexion 5/5 5/5  Ankle plantarflexion WFL tested in sitting WFL Tested in sitting  Ankle inversion    Ankle eversion     (Blank rows = not tested)    FUNCTIONAL TESTS:  Timed up and go (TUG): 33 sec with FWW 5x STS:  16 sec, decreased control lowering to chair.  Pt did not use UE's. Pt able to perform without UE support  GAIT:  Assistive device utilized: Walker - 2 wheeled Comments: step to to step thru gait pattern with L LE leading; decreased step length bilat, very slow gait speed, requires walker.  Pt is very slow with turning.    TODAY'S TREATMENT: Pt seen for aquatic therapy today.  Treatment took place in water 3.25-4 ft in depth at the [facility] pool. Temp of water was 91.  Pt entered/exited the pool via stairs with bilat rail and SBA/CGA.  She requires CGA for transfers to [facility] from chair lift.   Introduction to water Therapist in water with patient entire treatment - with CGA to close SBA.  With patient using water walker -> barbell  - multiple laps of forward / backward gait and side stepping, cues for increased Rt step length,  and wbing more anteriorly  Holding wall:  heel raises, squats, hip flex/ext, hip abdct/add x10 Gastroc and hamstring stretch seated on bench manual assist 3x20s ea Seated on bench with blue step under feet - STS with moderate-maximum HHA along with VC for weight shift forward on feet rather than heels, coming to complete stand gaining immediate standing balance Forward step ups with RLE x 10, with LLE x 10 onto blue step, UE supported on wall.   Pt requires buoyancy for support and to offload joints with strengthening exercises. Viscosity of the water is needed for resistance  of strengthening; water current perturbations provides challenge to standing balance unsupported, requiring increased core activation.    PATIENT EDUCATION:  Education details: objective findings, dx, prognosis, POC, and aquatic therapy process.  Educated pt and daughter in aquatic properties, rationale of exercises, and aquatic therapy benefits.   Person educated: Patient and daughter Education method: Explanation Education comprehension: verbalized understanding   HOME EXERCISE PROGRAM: Previous HEP :  [id number]  ASSESSMENT:  CLINICAL IMPRESSION: Pt seemingly impulsive today, maybe secondary to nervousness. Had difficulty following directions. No thinking tasks through. Was unable to gain balance without manual assist with exercises and for majority of amb time leaning posteriorly.  Cues given to slow pace and take her time throughout. She does not complain of any discomfort and seems to enjoy session. She is assisted into wc min-cga.  Goals ongoing Pt requires therapist in water near her.    OBJECTIVE IMPAIRMENTS Abnormal gait, decreased activity tolerance, decreased balance,  decreased endurance, decreased mobility, difficulty walking, decreased strength, and pain.   ACTIVITY LIMITATIONS standing, stairs, and locomotion level  PARTICIPATION LIMITATIONS: cleaning, driving, shopping, and community activity  PERSONAL FACTORS 3+ comorbidities: lumbar spinal stenosis and cervical stenosis, Osteopenia, anxiety, Reverse total shoulder arthroplasty    are also affecting patient's functional outcome.   REHAB POTENTIAL: Good  CLINICAL DECISION MAKING: Evolving/moderate complexity  EVALUATION COMPLEXITY: Moderate   GOALS:   SHORT TERM GOALS: Target date: 05/18/2022  Pt will tolerate aquatic therapy without adverse effects for improved mobility, function, and strength.  Baseline: Goal status: INITIAL  2.  Pt will improve her TUG time by at least 10 sec for improved mobility.  Baseline:  Goal status: INITIAL  3.  Pt will report improved confidence with mobility and balance. Baseline:  Goal status: INITIAL  4.  Pt will demo improved speed with gait.  Baseline:  Goal status: INITIAL    LONG TERM GOALS: Target date: 06/08/2022  Pt will report she is able to ambulate to her can independently without difficulty. Baseline:  Goal status: INITIAL  2.  Pt will perform TUG test in <14 sec for improved mobility and decreased fall risk. Baseline:  Goal status: INITIAL  3.  Pt will ambulate to get her mail without significant difficulty.  Baseline:  Goal status: INITIAL  4.  Pt will ambulate with a reciprocal gait with walker and report she is ambulating community distance safely, with good confidence, and without significant difficulty. Baseline:  Goal status: INITIAL  5.  Pt will demo improved strength in R hip flexion to 4+/5 and R knee extension to 5/5 MMT for improved tolerance with and performance of functional mobility.  Baseline:  Goal status: INITIAL    PLAN: PT FREQUENCY: 2x/week  PT DURATION: 6 weeks  PLANNED INTERVENTIONS: Therapeutic  exercises, Therapeutic activity, Neuromuscular re-education, Balance training, Gait training, Patient/Family education, Joint mobilization, Stair training, Aquatic Therapy, Electrical stimulation, Cryotherapy, Moist heat, Manual therapy, and Re-evaluation  PLAN FOR NEXT SESSION: continue Aquatic therapy  Review her HEP next land visit.  Stanton Kidney Tharon Aquas) Emmalise Huard MPT 05/24/22 3:33 PM

## 2022-05-28 ENCOUNTER — Ambulatory Visit (HOSPITAL_BASED_OUTPATIENT_CLINIC_OR_DEPARTMENT_OTHER): Payer: Medicare Other | Admitting: Physical Therapy

## 2022-05-28 ENCOUNTER — Encounter (HOSPITAL_BASED_OUTPATIENT_CLINIC_OR_DEPARTMENT_OTHER): Payer: Self-pay | Admitting: Physical Therapy

## 2022-05-28 DIAGNOSIS — R262 Difficulty in walking, not elsewhere classified: Secondary | ICD-10-CM

## 2022-05-28 DIAGNOSIS — M6281 Muscle weakness (generalized): Secondary | ICD-10-CM | POA: Diagnosis not present

## 2022-05-28 DIAGNOSIS — R296 Repeated falls: Secondary | ICD-10-CM | POA: Diagnosis not present

## 2022-05-28 DIAGNOSIS — M25571 Pain in right ankle and joints of right foot: Secondary | ICD-10-CM | POA: Diagnosis not present

## 2022-05-28 DIAGNOSIS — R278 Other lack of coordination: Secondary | ICD-10-CM

## 2022-05-28 NOTE — Therapy (Signed)
OUTPATIENT PHYSICAL THERAPY TREATMENT NOTE   Patient Name: Emily Horton MRN: [id number] DOB:1946/03/31, 76 y.o., female Today's Date: 05/28/2022    PT End of Session - 05/28/22 1236     Visit Number 5    Number of Visits 12    Date for PT Re-Evaluation 06/08/22    Authorization Type Medicare A and B    PT Start Time 1148    PT Stop Time 1230    PT Time Calculation (min) 42 min    Activity Tolerance Patient tolerated treatment well    Behavior During Therapy WFL for tasks assessed/performed              Past Medical History:  Diagnosis Date   Allergy    SEASONAL   Anxiety    Arthritis    RIGHT   Blood transfusion without reported diagnosis    "when had hip surgery"   Colon polyps 09/25/2007   Colonoscopy   Depression    Diverticulosis of colon (without mention of hemorrhage) 09/25/2007   Colonoscopy   GERD (gastroesophageal reflux disease)    Gynecological examination    sees Dr. Chevis Pretty    Hyperlipidemia    Osteopenia    Past Surgical History:  Procedure Laterality Date   COLONOSCOPY  12/30/2015   per Dr. Marina Goodell, benign  polyps, repeat in 5  yrs.    ESOPHAGOGASTRODUODENOSCOPY  06/30/2012   per Dr. Jarold Motto, reflux but no Barretts seen    HARDWARE REMOVAL Right 02/01/2020   Procedure: HARDWARE REMOVAL;  Surgeon: Kathryne Hitch, MD;  Location: [facility];  Service: Orthopedics;  Laterality: Right;   INTRAMEDULLARY (IM) NAIL INTERTROCHANTERIC Right 06/22/2019   INTRAMEDULLARY (IM) NAIL INTERTROCHANTERIC Right 06/22/2019   Procedure: INTRAMEDULLARY (IM) NAIL INTERTROCHANTRIC;  Surgeon: Myrene Galas, MD;  Location: [facility];  Service: Orthopedics;  Laterality: Right;   POLYPECTOMY     REVERSE SHOULDER ARTHROPLASTY Right 01/29/2020   Procedure: RIGHT REVERSE SHOULDER ARTHROPLASTY;  Surgeon: Teryl Lucy, MD;  Location: [facility];  Service: Orthopedics;  Laterality: Right;   TOTAL HIP ARTHROPLASTY Right 02/01/2020   Procedure: TOTAL HIP ARTHROPLASTY ANTERIOR  APPROACH;  Surgeon: Kathryne Hitch, MD;  Location: [facility];  Service: Orthopedics;  Laterality: Right;   Patient Active Problem List   Diagnosis Date Noted   Generalized anxiety disorder 01/13/2021   Fall 04/30/2020   Osteoarthritis, multiple sites 04/30/2020   Pneumonia 03/11/2020   Muscle spasm 03/10/2020   Right flank pain 03/10/2020   Right shoulder pain 02/08/2020   Right hip pain 02/08/2020   Blood loss anemia 02/08/2020   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, right    Retained orthopedic hardware    Closed fracture of right proximal humerus 01/29/2020   Closed right hip fracture, initial encounter ([facility]) 06/22/2019   Environmental and seasonal allergies 06/12/2018   Dyslipidemia 08/26/2017   IBS (irritable bowel syndrome) 08/26/2017   Angio-edema 11/03/2016   Diverticulosis of colon without hemorrhage 09/30/2015   Depression, recurrent ([facility]) 08/22/2007   GERD 07/04/2007     REFERRING PROVIDER: Kathryne Hitch, MD   REFERRING DIAG: [phone] (ICD-10-CM) - Pain in right ankle and joints of right foot             Z96.641 (ICD-10-CM) - History of right hip replacement   THERAPY DIAG:  Muscle weakness (generalized)  Difficulty in walking, not elsewhere classified  Other lack of coordination  Repeated falls  Rationale for Evaluation and Treatment Rehabilitation  ONSET DATE:  MD visit on 03/31/2022  SUBJECTIVE:   SUBJECTIVE STATEMENT: Pt reports that she could hardly walk when she got home from last session.  She states it may have been the leg stretch; "but I know I needed it".     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 No  Rating:  0/10  PRECAUTIONS: Other: R THR in 2021, lumbar and cervical stenosis, osteopenia, Reverse TSA in  2021  WEIGHT BEARING RESTRICTIONS No  FALLS:  Has patient fallen in last 6 months? Yes. Number of falls 3-4  LIVING ENVIRONMENT: Lives with: lives alone Lives in: 2 story home, but doesn't go up stairs Stairs:  Has following equipment at home: Single point cane, Environmental consultant - 2 wheeled, and Wheelchair (manual)  OCCUPATION: Pt is retired  PLOF: Independent.  Pt has regressed with mobility since prior PT ended.  Pt has someone help clean her home every 1-2 months.  PATIENT GOALS to be independent again, improve walking ability   OBJECTIVE:   DIAGNOSTIC FINDINGS:  X ray of pelvis shows well-seated total hip arthroplasty with no complicating  features.  X rays in January 2022 of the right ankle show no acute findings.   Lumbar MRI in August 2022:  IMPRESSION: Multilevel lumbar disc and facet degeneration with moderate to severe spinal stenosis at L3-4 and moderate spinal stenosis at L2-3.  PATIENT SURVEYS:  FOTO 31 with a goal of 43 at visit #15  COGNITION:  Overall cognitive status: Within functional limits for tasks assessed      LOWER EXTREMITY MMT:  MMT Right eval Left eval  Hip flexion 4/5 5/5  Hip extension    Hip abduction    Hip adduction    Hip internal rotation    Hip external rotation 4-/5 4+/5  Knee flexion    Knee extension 4/5 5/5  Ankle dorsiflexion 5/5 5/5  Ankle plantarflexion WFL tested in sitting WFL Tested in sitting  Ankle inversion    Ankle eversion     (Blank rows = not tested)    FUNCTIONAL TESTS:  Timed up and go (TUG): 33 sec with FWW 5x STS:  16 sec, decreased control lowering to chair.  Pt did not use UE's. Pt able to perform without UE support  GAIT:  Assistive device utilized: Walker - 2 wheeled Comments: step to to step thru gait pattern with L LE leading; decreased step length bilat, very slow gait speed, requires walker.  Pt is very slow with turning.    TODAY'S TREATMENT: Pt's family is present at pool side during  treatment.   Pt seen for aquatic therapy today.  Treatment took place in water 3.25-4 ft in depth at the [facility] pool. Temp of water was 91.  Pt entered/exited the pool via stairs with bilat rail and SBA/CGA.  She requires CGA for transfers to [facility] from chair lift.   Therapist in water with patient entire treatment - with CGA to close SBA.  With patient using barbell  - multiple laps of forward / backward gait and side stepping, cues for increased step length,  and wbing more anteriorly  Holding wall:  heel raises/toe raises; alternating knee flexion with rolling through foot with toes on floor;  squats, hip ext, hip abdct/add x10 R/L quad stretch with LE supported by blue noodle x 15 sec x 2 Seated on bench with blue  step under feet - STS with min HHA along with VC for weight shift forward on feet rather than heels, coming to complete stand gaining immediate standing balance x 8 reps Forward step ups with RLE x 10, UE supported on wall. Alternating toe taps to 5" step, with light UE on rails Staggered stance balance (intermittent UE to steady) NBOS with arms abdct/add and reciprocal arm swing Forward walking and side stepping with SBA, intermittent HHA with cues for slight forward flexion of trunk   Pt requires buoyancy for support and to offload joints with strengthening exercises. Viscosity of the water is needed for resistance of strengthening; water current perturbations provides challenge to standing balance unsupported, requiring increased core activation.    PATIENT EDUCATION:  Education details: objective findings, dx, prognosis, POC, and aquatic therapy process.  Educated pt and daughter in aquatic properties, rationale of exercises, and aquatic therapy benefits.   Person educated: Patient and daughter Education method: Explanation Education comprehension: verbalized understanding   HOME EXERCISE PROGRAM: Previous HEP :  [id number]  ASSESSMENT:  CLINICAL  IMPRESSION: Pt followed directions well and needed only minor redirection to task. She was given encouragement throughout as patient continues to appear anxious throughout. She was able to progress to gait without UE support and with SBA with heavy cues on forward trunk lean (vs posterior lean) and increased step length.  She completed all exercises without any production of symptoms.  Pt requires therapist in water near her.    OBJECTIVE IMPAIRMENTS Abnormal gait, decreased activity tolerance, decreased balance, decreased endurance, decreased mobility, difficulty walking, decreased strength, and pain.   ACTIVITY LIMITATIONS standing, stairs, and locomotion level  PARTICIPATION LIMITATIONS: cleaning, driving, shopping, and community activity  PERSONAL FACTORS 3+ comorbidities: lumbar spinal stenosis and cervical stenosis, Osteopenia, anxiety, Reverse total shoulder arthroplasty    are also affecting patient's functional outcome.   REHAB POTENTIAL: Good  CLINICAL DECISION MAKING: Evolving/moderate complexity  EVALUATION COMPLEXITY: Moderate   GOALS:   SHORT TERM GOALS: Target date: 05/18/2022  Pt will tolerate aquatic therapy without adverse effects for improved mobility, function, and strength.  Baseline: Goal status: INITIAL  2.  Pt will improve her TUG time by at least 10 sec for improved mobility.  Baseline:  Goal status: INITIAL  3.  Pt will report improved confidence with mobility and balance. Baseline:  Goal status: INITIAL  4.  Pt will demo improved speed with gait.  Baseline:  Goal status: INITIAL    LONG TERM GOALS: Target date: 06/08/2022  Pt will report she is able to ambulate to her can independently without difficulty. Baseline:  Goal status: INITIAL  2.  Pt will perform TUG test in <14 sec for improved mobility and decreased fall risk. Baseline:  Goal status: INITIAL  3.  Pt will ambulate to get her mail without significant difficulty.  Baseline:   Goal status: INITIAL  4.  Pt will ambulate with a reciprocal gait with walker and report she is ambulating community distance safely, with good confidence, and without significant difficulty. Baseline:  Goal status: INITIAL  5.  Pt will demo improved strength in R hip flexion to 4+/5 and R knee extension to 5/5 MMT for improved tolerance with and performance of functional mobility.  Baseline:  Goal status: INITIAL    PLAN: PT FREQUENCY: 2x/week  PT DURATION: 6 weeks  PLANNED INTERVENTIONS: Therapeutic exercises, Therapeutic activity, Neuromuscular re-education, Balance training, Gait training, Patient/Family education, Joint mobilization, Stair training, Aquatic Therapy, Electrical stimulation, Cryotherapy, Moist heat, Manual therapy,  and Re-evaluation  PLAN FOR NEXT SESSION: continue Aquatic therapy  Review her HEP next land visit. Assess STG.    Mayer Camel, PTA 05/28/22 2:58 PM

## 2022-06-02 ENCOUNTER — Ambulatory Visit (HOSPITAL_BASED_OUTPATIENT_CLINIC_OR_DEPARTMENT_OTHER): Payer: Medicare Other | Admitting: Physical Therapy

## 2022-06-02 ENCOUNTER — Encounter (HOSPITAL_BASED_OUTPATIENT_CLINIC_OR_DEPARTMENT_OTHER): Payer: Self-pay | Admitting: Physical Therapy

## 2022-06-02 DIAGNOSIS — M6281 Muscle weakness (generalized): Secondary | ICD-10-CM | POA: Diagnosis not present

## 2022-06-02 DIAGNOSIS — R278 Other lack of coordination: Secondary | ICD-10-CM

## 2022-06-02 DIAGNOSIS — R296 Repeated falls: Secondary | ICD-10-CM

## 2022-06-02 DIAGNOSIS — R262 Difficulty in walking, not elsewhere classified: Secondary | ICD-10-CM

## 2022-06-02 DIAGNOSIS — M25571 Pain in right ankle and joints of right foot: Secondary | ICD-10-CM | POA: Diagnosis not present

## 2022-06-02 NOTE — Therapy (Signed)
OUTPATIENT PHYSICAL THERAPY TREATMENT NOTE   Patient Name: Emily Horton MRN: [id number] DOB:1946-09-21, 76 y.o., female Today's Date: 06/02/2022    PT End of Session - 06/02/22 1654     Visit Number 6    Number of Visits 12    Date for PT Re-Evaluation 06/08/22    Authorization Type Medicare A and B    PT Start Time 1100    PT Stop Time 1140    PT Time Calculation (min) 40 min    Activity Tolerance Patient tolerated treatment well    Behavior During Therapy WFL for tasks assessed/performed              Past Medical History:  Diagnosis Date   Allergy    SEASONAL   Anxiety    Arthritis    RIGHT   Blood transfusion without reported diagnosis    "when had hip surgery"   Colon polyps 09/25/2007   Colonoscopy   Depression    Diverticulosis of colon (without mention of hemorrhage) 09/25/2007   Colonoscopy   GERD (gastroesophageal reflux disease)    Gynecological examination    sees Dr. Carren Rang    Hyperlipidemia    Osteopenia    Past Surgical History:  Procedure Laterality Date   COLONOSCOPY  12/30/2015   per Dr. Henrene Pastor, benign  polyps, repeat in 5  yrs.    ESOPHAGOGASTRODUODENOSCOPY  06/30/2012   per Dr. Sharlett Iles, reflux but no Barretts seen    HARDWARE REMOVAL Right 02/01/2020   Procedure: HARDWARE REMOVAL;  Surgeon: Mcarthur Rossetti, MD;  Location: [facility];  Service: Orthopedics;  Laterality: Right;   INTRAMEDULLARY (IM) NAIL INTERTROCHANTERIC Right 06/22/2019   INTRAMEDULLARY (IM) NAIL INTERTROCHANTERIC Right 06/22/2019   Procedure: INTRAMEDULLARY (IM) NAIL INTERTROCHANTRIC;  Surgeon: Altamese Hume, MD;  Location: [facility];  Service: Orthopedics;  Laterality: Right;   POLYPECTOMY     REVERSE SHOULDER ARTHROPLASTY Right 01/29/2020   Procedure: RIGHT REVERSE SHOULDER ARTHROPLASTY;  Surgeon: Marchia Bond, MD;  Location: [facility];  Service: Orthopedics;  Laterality: Right;   TOTAL HIP ARTHROPLASTY Right 02/01/2020   Procedure: TOTAL HIP ARTHROPLASTY ANTERIOR  APPROACH;  Surgeon: Mcarthur Rossetti, MD;  Location: [facility];  Service: Orthopedics;  Laterality: Right;   Patient Active Problem List   Diagnosis Date Noted   Generalized anxiety disorder 01/13/2021   Fall 04/30/2020   Osteoarthritis, multiple sites 04/30/2020   Pneumonia 03/11/2020   Muscle spasm 03/10/2020   Right flank pain 03/10/2020   Right shoulder pain 02/08/2020   Right hip pain 02/08/2020   Blood loss anemia 02/08/2020   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, right    Retained orthopedic hardware    Closed fracture of right proximal humerus 01/29/2020   Closed right hip fracture, initial encounter ([place]) 06/22/2019   Environmental and seasonal allergies 06/12/2018   Dyslipidemia 08/26/2017   IBS (irritable bowel syndrome) 08/26/2017   Angio-edema 11/03/2016   Diverticulosis of colon without hemorrhage 09/30/2015   Depression, recurrent ([place]) 08/22/2007   GERD 07/04/2007     REFERRING PROVIDER: Mcarthur Rossetti, MD   REFERRING DIAG: [phone] (ICD-10-CM) - Pain in right ankle and joints of right foot             Z96.641 (ICD-10-CM) - History of right hip replacement   THERAPY DIAG:  Muscle weakness (generalized)  Difficulty in walking, not elsewhere classified  Other lack of coordination  Repeated falls  Rationale for Evaluation and Treatment Rehabilitation  ONSET DATE:  MD visit on 03/31/2022  SUBJECTIVE:   SUBJECTIVE STATEMENT: Pt reports that she is frustrated that she can't get out in her garden to work,  and that she couldn't walk fast enough to talk to lawn man.   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 No  Rating:  0/10  PRECAUTIONS: Other: R THR in 2021, lumbar and cervical stenosis, osteopenia, Reverse TSA in 2021  WEIGHT  BEARING RESTRICTIONS No  FALLS:  Has patient fallen in last 6 months? Yes. Number of falls 3-4  LIVING ENVIRONMENT: Lives with: lives alone Lives in: 2 story home, but doesn't go up stairs Stairs:  Has following equipment at home: Single point cane, Environmental consultant - 2 wheeled, and Wheelchair (manual)  OCCUPATION: Pt is retired  PLOF: Independent.  Pt has regressed with mobility since prior PT ended.  Pt has someone help clean her home every 1-2 months.  PATIENT GOALS to be independent again, improve walking ability   OBJECTIVE:   DIAGNOSTIC FINDINGS:  X ray of pelvis shows well-seated total hip arthroplasty with no complicating  features.  X rays in January 2022 of the right ankle show no acute findings.   Lumbar MRI in August 2022:  IMPRESSION: Multilevel lumbar disc and facet degeneration with moderate to severe spinal stenosis at L3-4 and moderate spinal stenosis at L2-3.  PATIENT SURVEYS:  FOTO 31 with a goal of 43 at visit #15  COGNITION:  Overall cognitive status: Within functional limits for tasks assessed      LOWER EXTREMITY MMT:  MMT Right eval Left eval  Hip flexion 4/5 5/5  Hip extension    Hip abduction    Hip adduction    Hip internal rotation    Hip external rotation 4-/5 4+/5  Knee flexion    Knee extension 4/5 5/5  Ankle dorsiflexion 5/5 5/5  Ankle plantarflexion WFL tested in sitting WFL Tested in sitting  Ankle inversion    Ankle eversion     (Blank rows = not tested)    FUNCTIONAL TESTS:  Timed up and go (TUG): 33 sec with FWW 5x STS:  16 sec, decreased control lowering to chair.  Pt did not use UE's. Pt able to perform without UE support  GAIT:  Assistive device utilized: Walker - 2 wheeled Comments: step to to step thru gait pattern with L LE leading; decreased step length bilat, very slow gait speed, requires walker.  Pt is very slow with turning.    TODAY'S TREATMENT: Pt's family is present at pool side during treatment.   Pt  seen for aquatic therapy today.  Treatment took place in water 3.25-4 ft in depth at the [facility] pool. Temp of water was 91.  Pt entered/exited the pool via stairs with bilat rail and SBA/CGA.  She requires CGA for transfers to [facility] from chair lift.   Therapist in water with patient entire treatment - with CGA to close SBA.  With patient using barbell  - multiple laps of forward / backward gait and side stepping, cues for increased step length,  and wbing more anteriorly  Holding wall:  heel raises/toe raises; alternating knee flexion with rolling through foot with toes on floor;  squats, hip ext, hip abdct/add x10; stork stance hip IR/ER Trial of sitting on stairs with UE on rails; unable to sit upright without support of therapist due  to posterior lean Seated on bench  - STS with min HHA along with VC for weight shift forward on feet rather than heels x 10  Pt requires buoyancy for support and to offload joints with strengthening exercises. Viscosity of the water is needed for resistance of strengthening; water current perturbations provides challenge to standing balance unsupported, requiring increased core activation.    PATIENT EDUCATION:  Education details: objective findings, dx, prognosis, POC, and aquatic therapy process.  Educated pt and daughter in aquatic properties, rationale of exercises, and aquatic therapy benefits.   Person educated: Patient and daughter Education method: Explanation Education comprehension: verbalized understanding   HOME EXERCISE PROGRAM: Previous HEP :  [id number]  ASSESSMENT:  CLINICAL IMPRESSION:  She was given encouragement throughout as patient continues to appear anxious/ frustrated throughout. She continues with tendency of posterior trunk lean with gait and sit to/from stand. She requires cues for increased step length.   She was unable to complete exercises on seated on stairs due to leaning back and moving into back float; required  assist from therapist to right trunk to upright position.  She completed all exercises without any production of symptoms. She is making gradual progress towards all goals.  Pt requires therapist in water near her.    OBJECTIVE IMPAIRMENTS Abnormal gait, decreased activity tolerance, decreased balance, decreased endurance, decreased mobility, difficulty walking, decreased strength, and pain.   ACTIVITY LIMITATIONS standing, stairs, and locomotion level  PARTICIPATION LIMITATIONS: cleaning, driving, shopping, and community activity  PERSONAL FACTORS 3+ comorbidities: lumbar spinal stenosis and cervical stenosis, Osteopenia, anxiety, Reverse total shoulder arthroplasty    are also affecting patient's functional outcome.   REHAB POTENTIAL: Good  CLINICAL DECISION MAKING: Evolving/moderate complexity  EVALUATION COMPLEXITY: Moderate   GOALS:   SHORT TERM GOALS: Target date: 05/18/2022  Pt will tolerate aquatic therapy without adverse effects for improved mobility, function, and strength.  Baseline: Goal status: INITIAL  2.  Pt will improve her TUG time by at least 10 sec for improved mobility.  Baseline:  Goal status: INITIAL  3.  Pt will report improved confidence with mobility and balance. Baseline:  Goal status: INITIAL  4.  Pt will demo improved speed with gait.  Baseline:  Goal status: INITIAL    LONG TERM GOALS: Target date: 06/08/2022  Pt will report she is able to ambulate to her can independently without difficulty. Baseline:  Goal status: INITIAL  2.  Pt will perform TUG test in <14 sec for improved mobility and decreased fall risk. Baseline:  Goal status: INITIAL  3.  Pt will ambulate to get her mail without significant difficulty.  Baseline:  Goal status: INITIAL  4.  Pt will ambulate with a reciprocal gait with walker and report she is ambulating community distance safely, with good confidence, and without significant difficulty. Baseline:  Goal  status: INITIAL  5.  Pt will demo improved strength in R hip flexion to 4+/5 and R knee extension to 5/5 MMT for improved tolerance with and performance of functional mobility.  Baseline:  Goal status: INITIAL    PLAN: PT FREQUENCY: 2x/week  PT DURATION: 6 weeks  PLANNED INTERVENTIONS: Therapeutic exercises, Therapeutic activity, Neuromuscular re-education, Balance training, Gait training, Patient/Family education, Joint mobilization, Stair training, Aquatic Therapy, Electrical stimulation, Cryotherapy, Moist heat, Manual therapy, and Re-evaluation  PLAN FOR NEXT SESSION: continue Aquatic therapy  Review her HEP next land visit. Assess goals and recert.   Kerin Perna, PTA 06/02/22 4:57 PM

## 2022-06-04 ENCOUNTER — Ambulatory Visit (HOSPITAL_BASED_OUTPATIENT_CLINIC_OR_DEPARTMENT_OTHER): Payer: Medicare Other | Admitting: Physical Therapy

## 2022-06-04 ENCOUNTER — Encounter (HOSPITAL_BASED_OUTPATIENT_CLINIC_OR_DEPARTMENT_OTHER): Payer: Self-pay | Admitting: Physical Therapy

## 2022-06-04 DIAGNOSIS — R278 Other lack of coordination: Secondary | ICD-10-CM | POA: Diagnosis not present

## 2022-06-04 DIAGNOSIS — M6281 Muscle weakness (generalized): Secondary | ICD-10-CM

## 2022-06-04 DIAGNOSIS — R262 Difficulty in walking, not elsewhere classified: Secondary | ICD-10-CM

## 2022-06-04 DIAGNOSIS — R296 Repeated falls: Secondary | ICD-10-CM | POA: Diagnosis not present

## 2022-06-04 DIAGNOSIS — M25571 Pain in right ankle and joints of right foot: Secondary | ICD-10-CM | POA: Diagnosis not present

## 2022-06-04 NOTE — Therapy (Signed)
OUTPATIENT PHYSICAL THERAPY TREATMENT NOTE  Progress Note Reporting Period 04/27/22 to 06/04/22  See note below for Objective Data and Assessment of Progress/Goals.     Patient Name: Emily Horton MRN: [id number] DOB:[birth year], 76 y.o., female Today's Date: 06/04/2022    PT End of Session - 06/04/22 1028     Visit Number 7    Number of Visits 23    Date for PT Re-Evaluation 07/30/22    Authorization Type Medicare A and B    PT Start Time 1031    PT Stop Time 1115    PT Time Calculation (min) 44 min    Activity Tolerance Patient tolerated treatment well    Behavior During Therapy WFL for tasks assessed/performed              Past Medical History:  Diagnosis Date   Allergy    SEASONAL   Anxiety    Arthritis    RIGHT   Blood transfusion without reported diagnosis    "when had hip surgery"   Colon polyps 09/25/2007   Colonoscopy   Depression    Diverticulosis of colon (without mention of hemorrhage) 09/25/2007   Colonoscopy   GERD (gastroesophageal reflux disease)    Gynecological examination    sees Dr. Carren Rang    Hyperlipidemia    Osteopenia    Past Surgical History:  Procedure Laterality Date   COLONOSCOPY  12/30/2015   per Dr. Henrene Pastor, benign  polyps, repeat in 5  yrs.    ESOPHAGOGASTRODUODENOSCOPY  06/30/2012   per Dr. Sharlett Iles, reflux but no Barretts seen    HARDWARE REMOVAL Right 02/01/2020   Procedure: HARDWARE REMOVAL;  Surgeon: Mcarthur Rossetti, MD;  [facility]: [facility];  Service: Orthopedics;  Laterality: Right;   INTRAMEDULLARY (IM) NAIL INTERTROCHANTERIC Right 06/22/2019   INTRAMEDULLARY (IM) NAIL INTERTROCHANTERIC Right 06/22/2019   Procedure: INTRAMEDULLARY (IM) NAIL INTERTROCHANTRIC;  Surgeon: Altamese Severy, MD;  Location: [facility];  Service: Orthopedics;  Laterality: Right;   POLYPECTOMY     REVERSE SHOULDER ARTHROPLASTY Right 01/29/2020   Procedure: RIGHT REVERSE SHOULDER ARTHROPLASTY;  Surgeon: Marchia Bond, MD;  Location: [facility];   Service: Orthopedics;  Laterality: Right;   TOTAL HIP ARTHROPLASTY Right 02/01/2020   Procedure: TOTAL HIP ARTHROPLASTY ANTERIOR APPROACH;  Surgeon: Mcarthur Rossetti, MD;  Location: [facility];  Service: Orthopedics;  Laterality: Right;   Patient Active Problem List   Diagnosis Date Noted   Generalized anxiety disorder 01/13/2021   Fall 04/30/2020   Osteoarthritis, multiple sites 04/30/2020   Pneumonia 03/11/2020   Muscle spasm 03/10/2020   Right flank pain 03/10/2020   Right shoulder pain 02/08/2020   Right hip pain 02/08/2020   Blood loss anemia 02/08/2020   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, right    Retained orthopedic hardware    Closed fracture of right proximal humerus 01/29/2020   Closed right hip fracture, initial encounter ([place]) 06/22/2019   Environmental and seasonal allergies 06/12/2018   Dyslipidemia 08/26/2017   IBS (irritable bowel syndrome) 08/26/2017   Angio-edema 11/03/2016   Diverticulosis of colon without hemorrhage 09/30/2015   Depression, recurrent ([place]) 08/22/2007   GERD 07/04/2007     REFERRING PROVIDER: Mcarthur Rossetti, MD   REFERRING DIAG: [phone] (ICD-10-CM) - Pain in right ankle and joints of right foot             Z96.641 (ICD-10-CM) - History of right hip replacement   THERAPY DIAG:  Muscle weakness (generalized)  Difficulty in walking,  not elsewhere classified  Other lack of coordination  Rationale for Evaluation and Treatment Rehabilitation  ONSET DATE: MD visit on 03/31/2022  SUBJECTIVE:   SUBJECTIVE STATEMENT: Pt reports she slid off her bed yesterday and needed EMS to get her off floor.  She is now sore in her right shoulder (more so than prior) and right quad  PERTINENT HISTORY: Anterior THR on 02/01/2020,  lumbar spinal stenosis and cervical stenosis, Osteopenia, anxiety and depression, Reverse total shoulder arthroplasty on 01/29/2020    PAIN:  Are you having pain? No   Rating:  0/10  PRECAUTIONS: Other: R THR in 2021, lumbar and cervical stenosis, osteopenia, Reverse TSA in 2021  WEIGHT BEARING RESTRICTIONS No  FALLS:  Has patient fallen in last 6 months? Yes. Number of falls 3-4  LIVING ENVIRONMENT: Lives with: lives alone Lives in: 2 story home, but doesn't go up stairs Stairs:  Has following equipment at home: Single point cane, Environmental consultant - 2 wheeled, and Wheelchair (manual)  OCCUPATION: Pt is retired  PLOF: Independent.  Pt has regressed with mobility since prior PT ended.  Pt has someone help clean her home every 1-2 months.  PATIENT GOALS to be independent again, improve walking ability   OBJECTIVE:   DIAGNOSTIC FINDINGS:  X ray of pelvis shows well-seated total hip arthroplasty with no complicating  features.  X rays in January 2022 of the right ankle show no acute findings.   Lumbar MRI in August 2022:  IMPRESSION: Multilevel lumbar disc and facet degeneration with moderate to severe spinal stenosis at L3-4 and moderate spinal stenosis at L2-3.  PATIENT SURVEYS:  FOTO 31 with a goal of 43 at visit #15   06/04/22: 30  COGNITION:  Overall cognitive status: Within functional limits for tasks assessed      LOWER EXTREMITY MMT:  MMT Right eval Left eval Right/Left 06/04/22  Hip flexion 4/5 5/5 4/5 - 5/5  Hip extension     Hip abduction     Hip adduction     Hip internal rotation     Hip external rotation 4-/5 4+/5 4-/5 - 5-/5  Knee flexion     Knee extension 4/5 5/5 4+/5 - 5/5  Ankle dorsiflexion 5/5 5/5   Ankle plantarflexion WFL tested in sitting WFL Tested in sitting   Ankle inversion     Ankle eversion      (Blank rows = not tested)    FUNCTIONAL TESTS:  Timed up and go (TUG): 33 sec with FWW   30s 06/04/22 5x STS:  16 sec, decreased control lowering to chair.  Pt did not use UE's. Pt able to perform without UE support/ Needed UE support 22  GAIT:  Assistive device utilized: Walker - 2 wheeled Comments:  step to to step thru gait pattern with L LE leading; decreased step length bilat, very slow gait speed, requires walker.  Pt is very slow with turning.    TODAY'S TREATMENT: RE-assessment/objective testing  Pt seen for aquatic therapy today.  Treatment took place in water 3.25-4 ft in depth at the [facility] pool. Temp of water was 91.  Pt entered/exited the pool via stairs with bilat rail and SBA/CGA.  She requires CGA for transfers to [facility] from chair lift.    Therapist in water with patient entire treatment - with CGA to close SBA.   With patient using barbell  - multiple laps of forward cues for step length, heel strike and toe off. Backward amb x 1 width.  Holding to  barbell weight shifting forward and back, mimicking gait with rle x10  Holding wall:  heel raises/toe raises;  R hip ext; R hip abdct/add; R hip flex 2 x10; stork    Pt requires buoyancy for support and to offload joints with strengthening exercises. Viscosity of the water is needed for resistance of strengthening; water current perturbations provides challenge to standing balance unsupported, requiring increased core activation.    PATIENT EDUCATION:  Education details: objective findings, dx, prognosis, POC, and aquatic therapy process.  Educated pt and daughter in aquatic properties, rationale of exercises, and aquatic therapy benefits.   Person educated: Patient and daughter Education method: Explanation Education comprehension: verbalized understanding   HOME EXERCISE PROGRAM: Previous HEP :  [id number]  ASSESSMENT:  CLINICAL IMPRESSION:  Pt seen for re-assessment today.  She had a fall yesterday off bed increasing her pain in rle and shoulder.  Testing today consistent with event of yesterday, without objective improvements including Foto score. She has reported feeling like she has been gaining strength and confidence which is waned slightly. She tolerates aquatic sessions very well and enjoys them.  Demonstrates improved reciprocal gait submerged today with cues, heel striking 50% of time.  Appears movement improves with focus on task without over thinking. With distraction pattern greatly decreases. Improved weight shifting forward decreasing LOB posteriorly. She requires less manual assist submerged with improved confidence in setting. She will continue to benefit from aquatic therapy to progress towards meeting goals. She has only been able to participate ~50% of what was originally planned due to scheduling difficulties.  Will cert 1-2 x week for 8 weeks. Pt requires therapist in water near her.      OBJECTIVE IMPAIRMENTS Abnormal gait, decreased activity tolerance, decreased balance, decreased endurance, decreased mobility, difficulty walking, decreased strength, and pain.   ACTIVITY LIMITATIONS standing, stairs, and locomotion level  PARTICIPATION LIMITATIONS: cleaning, driving, shopping, and community activity  PERSONAL FACTORS 3+ comorbidities: lumbar spinal stenosis and cervical stenosis, Osteopenia, anxiety, Reverse total shoulder arthroplasty    are also affecting patient's functional outcome.   REHAB POTENTIAL: Good  CLINICAL DECISION MAKING: Evolving/moderate complexity  EVALUATION COMPLEXITY: Moderate   GOALS:   SHORT TERM GOALS: Target date: 05/18/2022  Pt will tolerate aquatic therapy without adverse effects for improved mobility, function, and strength.  Baseline: Goal status: Achieved  2.  Pt will improve her TUG time by at least 10 sec for improved mobility.  Baseline:  Goal status: Ongoing  3.  Pt will report improved confidence with mobility and balance. Baseline:  Goal status: ongoing  4.  Pt will demo improved speed with gait.  Baseline:  Goal status: ongoing    LONG TERM GOALS: Target date: 07/30/2022  Pt will report she is able to ambulate to her cane independently without difficulty. Baseline:  Goal status: INITIAL  2.  Pt will perform  TUG test in <14 sec for improved mobility and decreased fall risk. Baseline:  Goal status: INITIAL  3.  Pt will ambulate to get her mail without significant difficulty.  Baseline:  Goal status: INITIAL  4.  Pt will ambulate with a reciprocal gait with walker and report she is ambulating community distance safely, with good confidence, and without significant difficulty. Baseline:  Goal status: INITIAL  5.  Pt will demo improved strength in R hip flexion to 4+/5 and R knee extension to 5/5 MMT for improved tolerance with and performance of functional mobility.  Baseline:  Goal status: INITIAL    PLAN: PT FREQUENCY:  1-2x/week  PT DURATION: 8 weeks  PLANNED INTERVENTIONS: Therapeutic exercises, Therapeutic activity, Neuromuscular re-education, Balance training, Gait training, Patient/Family education, Joint mobilization, Stair training, Aquatic Therapy, Electrical stimulation, Cryotherapy, Moist heat, Manual therapy, and Re-evaluation  PLAN FOR NEXT SESSION: continue Aquatic therapy     Stanton Kidney Tharon Aquas) Gladies Sofranko MPT 06/04/22 12:22 PM

## 2022-06-07 ENCOUNTER — Encounter: Payer: Self-pay | Admitting: Family Medicine

## 2022-06-07 NOTE — Telephone Encounter (Signed)
Patient is due for Prolia on or after 06/22/22. Estimated cost is $0. Message sent to patient via mychart letting her know it is time to schedule.

## 2022-06-11 ENCOUNTER — Encounter (HOSPITAL_BASED_OUTPATIENT_CLINIC_OR_DEPARTMENT_OTHER): Payer: Self-pay | Admitting: Physical Therapy

## 2022-06-11 ENCOUNTER — Ambulatory Visit (HOSPITAL_BASED_OUTPATIENT_CLINIC_OR_DEPARTMENT_OTHER): Payer: Medicare Other | Attending: Orthopaedic Surgery | Admitting: Physical Therapy

## 2022-06-11 DIAGNOSIS — R262 Difficulty in walking, not elsewhere classified: Secondary | ICD-10-CM

## 2022-06-11 DIAGNOSIS — R296 Repeated falls: Secondary | ICD-10-CM

## 2022-06-11 DIAGNOSIS — R278 Other lack of coordination: Secondary | ICD-10-CM

## 2022-06-11 DIAGNOSIS — M6281 Muscle weakness (generalized): Secondary | ICD-10-CM

## 2022-06-11 NOTE — Therapy (Signed)
OUTPATIENT PHYSICAL THERAPY TREATMENT NOTE   Patient Name: Emily Horton MRN: [id number] DOB:[birth year], 76 y.o., female Today's Date: 06/11/2022    PT End of Session - 06/11/22 1521     Visit Number 8    Number of Visits 23    Date for PT Re-Evaluation 07/30/22    Authorization Type Medicare A and B    PT Start Time 1519    PT Stop Time 1600    PT Time Calculation (min) 41 min    Activity Tolerance Patient tolerated treatment well    Behavior During Therapy WFL for tasks assessed/performed              Past Medical History:  Diagnosis Date   Allergy    SEASONAL   Anxiety    Arthritis    RIGHT   Blood transfusion without reported diagnosis    "when had hip surgery"   Colon polyps 09/25/2007   Colonoscopy   Depression    Diverticulosis of colon (without mention of hemorrhage) 09/25/2007   Colonoscopy   GERD (gastroesophageal reflux disease)    Gynecological examination    sees Dr. Carren Rang    Hyperlipidemia    Osteopenia    Past Surgical History:  Procedure Laterality Date   COLONOSCOPY  12/30/2015   per Dr. Henrene Pastor, benign  polyps, repeat in 5  yrs.    ESOPHAGOGASTRODUODENOSCOPY  06/30/2012   per Dr. Sharlett Iles, reflux but no Barretts seen    HARDWARE REMOVAL Right 02/01/2020   Procedure: HARDWARE REMOVAL;  Surgeon: Mcarthur Rossetti, MD;  Location: [facility];  Service: Orthopedics;  Laterality: Right;   INTRAMEDULLARY (IM) NAIL INTERTROCHANTERIC Right 06/22/2019   INTRAMEDULLARY (IM) NAIL INTERTROCHANTERIC Right 06/22/2019   Procedure: INTRAMEDULLARY (IM) NAIL INTERTROCHANTRIC;  Surgeon: Altamese Marthasville, MD;  Location: [place];  Service: Orthopedics;  Laterality: Right;   POLYPECTOMY     REVERSE SHOULDER ARTHROPLASTY Right 01/29/2020   Procedure: RIGHT REVERSE SHOULDER ARTHROPLASTY;  Surgeon: Marchia Bond, MD;  Location: [facility];  Service: Orthopedics;  Laterality: Right;   TOTAL HIP ARTHROPLASTY Right 02/01/2020   Procedure: TOTAL HIP ARTHROPLASTY ANTERIOR  APPROACH;  Surgeon: Mcarthur Rossetti, MD;  Location: [facility];  Service: Orthopedics;  Laterality: Right;   Patient Active Problem List   Diagnosis Date Noted   Generalized anxiety disorder 01/13/2021   Fall 04/30/2020   Osteoarthritis, multiple sites 04/30/2020   Pneumonia 03/11/2020   Muscle spasm 03/10/2020   Right flank pain 03/10/2020   Right shoulder pain 02/08/2020   Right hip pain 02/08/2020   Blood loss anemia 02/08/2020   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, right    Retained orthopedic hardware    Closed fracture of right proximal humerus 01/29/2020   Closed right hip fracture, initial encounter ([place]) 06/22/2019   Environmental and seasonal allergies 06/12/2018   Dyslipidemia 08/26/2017   IBS (irritable bowel syndrome) 08/26/2017   Angio-edema 11/03/2016   Diverticulosis of colon without hemorrhage 09/30/2015   Depression, recurrent ([place]) 08/22/2007   GERD 07/04/2007     REFERRING PROVIDER: Mcarthur Rossetti, MD   REFERRING DIAG: [phone] (ICD-10-CM) - Pain in right ankle and joints of right foot             Z96.641 (ICD-10-CM) - History of right hip replacement   THERAPY DIAG:  Muscle weakness (generalized)  Difficulty in walking, not elsewhere classified  Other lack of coordination  Repeated falls  Rationale for Evaluation and Treatment Rehabilitation  ONSET DATE:  MD visit on 03/31/2022  SUBJECTIVE:   SUBJECTIVE STATEMENT: Pt reports she has not had any more falls.  She voices frustration with her progress.    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 No  Rating:  0/10  PRECAUTIONS: Other: R THR in 2021, lumbar and cervical stenosis, osteopenia, Reverse TSA in 2021  WEIGHT BEARING RESTRICTIONS No  FALLS:  Has patient  fallen in last 6 months? Yes. Number of falls 3-4  LIVING ENVIRONMENT: Lives with: lives alone Lives in: 2 story home, but doesn't go up stairs Stairs:  Has following equipment at home: Single point cane, Environmental consultant - 2 wheeled, and Wheelchair (manual)  OCCUPATION: Pt is retired  PLOF: Independent.  Pt has regressed with mobility since prior PT ended.  Pt has someone help clean her home every 1-2 months.  PATIENT GOALS to be independent again, improve walking ability   OBJECTIVE:   DIAGNOSTIC FINDINGS:  X ray of pelvis shows well-seated total hip arthroplasty with no complicating  features.  X rays in January 2022 of the right ankle show no acute findings.   Lumbar MRI in August 2022:  IMPRESSION: Multilevel lumbar disc and facet degeneration with moderate to severe spinal stenosis at L3-4 and moderate spinal stenosis at L2-3.  PATIENT SURVEYS:  FOTO 31 with a goal of 43 at visit #15   06/04/22: 30  COGNITION:  Overall cognitive status: Within functional limits for tasks assessed      LOWER EXTREMITY MMT:  MMT Right eval Left eval Right/Left 06/04/22  Hip flexion 4/5 5/5 4/5 - 5/5  Hip extension     Hip abduction     Hip adduction     Hip internal rotation     Hip external rotation 4-/5 4+/5 4-/5 - 5-/5  Knee flexion     Knee extension 4/5 5/5 4+/5 - 5/5  Ankle dorsiflexion 5/5 5/5   Ankle plantarflexion WFL tested in sitting WFL Tested in sitting   Ankle inversion     Ankle eversion      (Blank rows = not tested)    FUNCTIONAL TESTS:  Timed up and go (TUG): 33 sec with FWW    30s 06/04/22  5x STS:  16 sec, decreased control lowering to chair.  Pt did not use UE's. Pt able to perform without UE support/ 06/04/22: 5x STS Needed UE support 22sec  GAIT:(eval)  Assistive device utilized: Walker - 2 wheeled Comments: step to to step thru gait pattern with L LE leading; decreased step length bilat, very slow gait speed, requires walker.  Pt is very slow with  turning.    TODAY'S TREATMENT: Pt seen for aquatic therapy today.  Treatment took place in water 3.25-4 ft in depth at the [facility] pool. Temp of water was 91.  Pt entered/exited the pool via stairs with step-to pattern sideways, bilat rail and SBA.  She requires CGA for transfers to/from [facility] with cues for continued stepping towards WC prior to sitting.   * Therapist in water with patient entire treatment - with CGA to close SBA.  * With patient using barbell  - multiple laps of forward cues for step length, heel strike and toe off. Backward amb x 1 width; side stepping R/L - cues to slow down and bring legs together into adduction (vs shuffle). * Holding wall:  heel raises; squats;  hip abdct/ add;  R/L  hip ext; Marching with high knees; side stepping R/L with cues for full adduction  * Holding noodle/CGA -> wall:  SLS with toe taps forward, side, back x 5 each LE * Holding wall:  Rt quad stretch (passive, therapist lifting lower leg) x 2 reps   Pt requires buoyancy for support and to offload joints with strengthening exercises. Viscosity of the water is needed for resistance of strengthening; water current perturbations provides challenge to standing balance unsupported, requiring increased core activation.    PATIENT EDUCATION:  Education details: objective findings, dx, prognosis, POC, and aquatic therapy process.  Educated pt and daughter in aquatic properties, rationale of exercises, and aquatic therapy benefits.   Person educated: Patient and daughter Education method: Explanation Education comprehension: verbalized understanding   HOME EXERCISE PROGRAM: Previous HEP :  [id number]  ASSESSMENT:  CLINICAL IMPRESSION:  With time, repetition, and with focus on task, movement and gait quality improves.  Improved weight shifting forward with decreasing LOB posteriorly. With time, she requires less manual assist submerged with improved confidence in setting. She will continue  to benefit from aquatic therapy to progress towards meeting goals. Reviewed verbally/ visually (demo) exercises at pool wall that she can do at her counter, to continue to strengthen her LE without risk of falling.  Pt requires therapist in water near her.      OBJECTIVE IMPAIRMENTS Abnormal gait, decreased activity tolerance, decreased balance, decreased endurance, decreased mobility, difficulty walking, decreased strength, and pain.   ACTIVITY LIMITATIONS standing, stairs, and locomotion level  PARTICIPATION LIMITATIONS: cleaning, driving, shopping, and community activity  PERSONAL FACTORS 3+ comorbidities: lumbar spinal stenosis and cervical stenosis, Osteopenia, anxiety, Reverse total shoulder arthroplasty    are also affecting patient's functional outcome.   REHAB POTENTIAL: Good  CLINICAL DECISION MAKING: Evolving/moderate complexity  EVALUATION COMPLEXITY: Moderate   GOALS:   SHORT TERM GOALS: Target date: 05/18/2022  Pt will tolerate aquatic therapy without adverse effects for improved mobility, function, and strength.  Baseline: Goal status: Achieved  2.  Pt will improve her TUG time by at least 10 sec for improved mobility.  Baseline:  Goal status: Ongoing  3.  Pt will report improved confidence with mobility and balance. Baseline:  Goal status: ongoing  4.  Pt will demo improved speed with gait.  Baseline:  Goal status: ongoing    LONG TERM GOALS: Target date: 07/30/2022  Pt will report she is able to ambulate to her cane independently without difficulty. Baseline:  Goal status: INITIAL  2.  Pt will perform TUG test in <14 sec for improved mobility and decreased fall risk. Baseline:  Goal status: INITIAL  3.  Pt will ambulate to get her mail without significant difficulty.  Baseline:  Goal status: INITIAL  4.  Pt will ambulate with a reciprocal gait with walker and report she is ambulating community distance safely, with good confidence, and without  significant difficulty. Baseline:  Goal status: INITIAL  5.  Pt will demo improved strength in R hip flexion to 4+/5 and R knee extension to 5/5 MMT for improved tolerance with and performance of functional mobility.  Baseline:  Goal status: INITIAL    PLAN: PT FREQUENCY: 1-2x/week  PT DURATION: 8 weeks  PLANNED INTERVENTIONS: Therapeutic exercises, Therapeutic activity, Neuromuscular re-education, Balance training, Gait training, Patient/Family education, Joint mobilization, Stair training, Aquatic Therapy, Electrical stimulation, Cryotherapy, Moist heat, Manual therapy, and Re-evaluation  PLAN FOR NEXT SESSION: continue Aquatic therapy     Kerin Perna,  PTA 06/11/22 4:27 PM

## 2022-06-15 ENCOUNTER — Ambulatory Visit (HOSPITAL_BASED_OUTPATIENT_CLINIC_OR_DEPARTMENT_OTHER): Payer: Medicare Other | Admitting: Physical Therapy

## 2022-06-17 ENCOUNTER — Ambulatory Visit (HOSPITAL_BASED_OUTPATIENT_CLINIC_OR_DEPARTMENT_OTHER): Payer: Medicare Other | Admitting: Physical Therapy

## 2022-06-17 ENCOUNTER — Encounter (HOSPITAL_BASED_OUTPATIENT_CLINIC_OR_DEPARTMENT_OTHER): Payer: Self-pay | Admitting: Physical Therapy

## 2022-06-17 DIAGNOSIS — M6281 Muscle weakness (generalized): Secondary | ICD-10-CM | POA: Diagnosis not present

## 2022-06-17 DIAGNOSIS — R262 Difficulty in walking, not elsewhere classified: Secondary | ICD-10-CM

## 2022-06-17 DIAGNOSIS — R278 Other lack of coordination: Secondary | ICD-10-CM

## 2022-06-17 DIAGNOSIS — R296 Repeated falls: Secondary | ICD-10-CM

## 2022-06-17 NOTE — Therapy (Signed)
Progress Note Reporting Period 04/27/22 to 06/17/2022  See note below for Objective Data and Assessment of Progress/Goals.     OUTPATIENT PHYSICAL THERAPY TREATMENT NOTE   Patient Name: Emily Horton MRN: [id number] DOB:[birth year], 76 y.o., female Today's Date: 06/17/2022    PT End of Session - 06/17/22 1035     Visit Number 9    Number of Visits 23    Date for PT Re-Evaluation 07/30/22    Authorization Type Medicare A and B    PT Start Time 1034    PT Stop Time 1115    PT Time Calculation (min) 41 min    Activity Tolerance Patient tolerated treatment well    Behavior During Therapy WFL for tasks assessed/performed              Past Medical History:  Diagnosis Date   Allergy    SEASONAL   Anxiety    Arthritis    RIGHT   Blood transfusion without reported diagnosis    "when had hip surgery"   Colon polyps 09/25/2007   Colonoscopy   Depression    Diverticulosis of colon (without mention of hemorrhage) 09/25/2007   Colonoscopy   GERD (gastroesophageal reflux disease)    Gynecological examination    sees Dr. Carren Rang    Hyperlipidemia    Osteopenia    Past Surgical History:  Procedure Laterality Date   COLONOSCOPY  12/30/2015   per Dr. Henrene Pastor, benign  polyps, repeat in 5  yrs.    ESOPHAGOGASTRODUODENOSCOPY  06/30/2012   per Dr. Sharlett Iles, reflux but no Barretts seen    HARDWARE REMOVAL Right 02/01/2020   Procedure: HARDWARE REMOVAL;  Surgeon: Mcarthur Rossetti, MD;  Location: [facility];  Service: Orthopedics;  Laterality: Right;   INTRAMEDULLARY (IM) NAIL INTERTROCHANTERIC Right 06/22/2019   INTRAMEDULLARY (IM) NAIL INTERTROCHANTERIC Right 06/22/2019   Procedure: INTRAMEDULLARY (IM) NAIL INTERTROCHANTRIC;  Surgeon: Altamese Taft, MD;  Location: [place];  Service: Orthopedics;  Laterality: Right;   POLYPECTOMY     REVERSE SHOULDER ARTHROPLASTY Right 01/29/2020   Procedure: RIGHT REVERSE SHOULDER ARTHROPLASTY;  Surgeon: Marchia Bond, MD;  Location: [facility];   Service: Orthopedics;  Laterality: Right;   TOTAL HIP ARTHROPLASTY Right 02/01/2020   Procedure: TOTAL HIP ARTHROPLASTY ANTERIOR APPROACH;  Surgeon: Mcarthur Rossetti, MD;  Location: [facility];  Service: Orthopedics;  Laterality: Right;   Patient Active Problem List   Diagnosis Date Noted   Generalized anxiety disorder 01/13/2021   Fall 04/30/2020   Osteoarthritis, multiple sites 04/30/2020   Pneumonia 03/11/2020   Muscle spasm 03/10/2020   Right flank pain 03/10/2020   Right shoulder pain 02/08/2020   Right hip pain 02/08/2020   Blood loss anemia 02/08/2020   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, right    Retained orthopedic hardware    Closed fracture of right proximal humerus 01/29/2020   Closed right hip fracture, initial encounter ([place]) 06/22/2019   Environmental and seasonal allergies 06/12/2018   Dyslipidemia 08/26/2017   IBS (irritable bowel syndrome) 08/26/2017   Angio-edema 11/03/2016   Diverticulosis of colon without hemorrhage 09/30/2015   Depression, recurrent ([place]) 08/22/2007   GERD 07/04/2007     REFERRING PROVIDER: Mcarthur Rossetti, MD   REFERRING DIAG: [phone] (ICD-10-CM) - Pain in right ankle and joints of right foot             Z96.641 (ICD-10-CM) - History of right hip replacement   THERAPY DIAG:  Muscle weakness (generalized)  Difficulty in  walking, not elsewhere classified  Other lack of coordination  Repeated falls  Rationale for Evaluation and Treatment Rehabilitation  ONSET DATE: MD visit on 03/31/2022  SUBJECTIVE:   SUBJECTIVE STATEMENT: Pt reports no pain just stiffness.    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 No  Rating:  0/10  PRECAUTIONS: Other: R THR in 2021, lumbar and cervical stenosis, osteopenia,  Reverse TSA in 2021  WEIGHT BEARING RESTRICTIONS No  FALLS:  Has patient fallen in last 6 months? Yes. Number of falls 3-4  LIVING ENVIRONMENT: Lives with: lives alone Lives in: 2 story home, but doesn't go up stairs Stairs:  Has following equipment at home: Single point cane, Environmental consultant - 2 wheeled, and Wheelchair (manual)  OCCUPATION: Pt is retired  PLOF: Independent.  Pt has regressed with mobility since prior PT ended.  Pt has someone help clean her home every 1-2 months.  PATIENT GOALS to be independent again, improve walking ability   OBJECTIVE:   DIAGNOSTIC FINDINGS:  X ray of pelvis shows well-seated total hip arthroplasty with no complicating  features.  X rays in January 2022 of the right ankle show no acute findings.   Lumbar MRI in August 2022:  IMPRESSION: Multilevel lumbar disc and facet degeneration with moderate to severe spinal stenosis at L3-4 and moderate spinal stenosis at L2-3.  PATIENT SURVEYS:  FOTO 31 with a goal of 43 at visit #15   06/04/22: 30  COGNITION:  Overall cognitive status: Within functional limits for tasks assessed      LOWER EXTREMITY MMT:  MMT Right eval Left eval Right/Left 06/04/22 Right 7/13  Hip flexion 4/5 5/5 4/5 - 5/5 4+  Hip extension      Hip abduction      Hip adduction      Hip internal rotation      Hip external rotation 4-/5 4+/5 4-/5 - 5-/5   Knee flexion      Knee extension 4/5 5/5 4+/5 - 5/5 5-  Ankle dorsiflexion 5/5 5/5    Ankle plantarflexion WFL tested in sitting WFL Tested in sitting    Ankle inversion      Ankle eversion       (Blank rows = not tested)    FUNCTIONAL TESTS:  Timed up and go (TUG): 33 sec with FWW    30s 06/04/22  5x STS:  16 sec, decreased control lowering to chair.  Pt did not use UE's. Pt able to perform without UE support/ 06/04/22: 5x STS Needed UE support 22sec  GAIT:(eval)  Assistive device utilized: Walker - 2 wheeled Comments: step to to step thru gait pattern with L  LE leading; decreased step length bilat, very slow gait speed, requires walker.  Pt is very slow with turning.    TODAY'S TREATMENT: Pt seen for aquatic therapy today.  Treatment took place in water 3.25-4 ft in depth at the [facility] pool. Temp of water was 91.  Pt entered/exited the pool via stairs with step-to pattern sideways, bilat rail and SBA.  She requires CGA for transfers to/from [facility] with cues for continued stepping towards WC prior to sitting.   * Therapist in water with patient entire treatment - with CGA to close SBA.  * With patient yellow noodle /HHA  - multiple laps of forward cues for step length, heel strike and toe off. Backward  amb x 1 width; side stepping R/L - cues to slow down and bring legs together into adduction (vs shuffle). * Holding wall:  heel raises; squats; hip abdct/ add;  R/L  hip ext; Marching with high knees; side stepping R/L with cues for full adduction  * Holding noodle/CGA -> wall:  SLS with toe taps forward, side, back x 5 each LE * Holding wall:  Rt quad stretch (passive, therapist lifting lower leg) x 2 reps   Pt requires buoyancy for support and to offload joints with strengthening exercises. Viscosity of the water is needed for resistance of strengthening; water current perturbations provides challenge to standing balance unsupported, requiring increased core activation.    PATIENT EDUCATION:  Education details: objective findings, dx, prognosis, POC, and aquatic therapy process.  Educated pt and daughter in aquatic properties, rationale of exercises, and aquatic therapy benefits.   Person educated: Patient and daughter Education method: Explanation Education comprehension: verbalized understanding   HOME EXERCISE PROGRAM: Previous HEP :  [id number]  ASSESSMENT:  CLINICAL IMPRESSION: Progress note: Pt making progress in all areas.  Functional testing TUG,5 x STS and strength as documented above demonstrating improvement in balance,  strength and amb ability.  Her gait speed has increased slightly meeting STG #4. She reports feeling stronger and safer with functional mobility at home although continues to be afraid of falling since living alone (meeting STG #3). Safety and confidence in pool has improved, but she continues to require assistance submerged. She will continue to benefit from skilled PT to further improve safety and function.       OBJECTIVE IMPAIRMENTS Abnormal gait, decreased activity tolerance, decreased balance, decreased endurance, decreased mobility, difficulty walking, decreased strength, and pain.   ACTIVITY LIMITATIONS standing, stairs, and locomotion level  PARTICIPATION LIMITATIONS: cleaning, driving, shopping, and community activity  PERSONAL FACTORS 3+ comorbidities: lumbar spinal stenosis and cervical stenosis, Osteopenia, anxiety, Reverse total shoulder arthroplasty    are also affecting patient's functional outcome.   REHAB POTENTIAL: Good  CLINICAL DECISION MAKING: Evolving/moderate complexity  EVALUATION COMPLEXITY: Moderate   GOALS:   SHORT TERM GOALS: Target date: 05/18/2022  Pt will tolerate aquatic therapy without adverse effects for improved mobility, function, and strength.  Baseline: Goal status: Achieved  2.  Pt will improve her TUG time by at least 10 sec for improved mobility.  Baseline:  Goal status: Ongoing  3.  Pt will report improved confidence with mobility and balance. Baseline:  Goal status: Achieved  4.  Pt will demo improved speed with gait.  Baseline:  Goal status: Achieved    LONG TERM GOALS: Target date: 07/30/2022  Pt will report she is able to ambulate to her cane independently without difficulty. Baseline:  Goal status: INITIAL  2.  Pt will perform TUG test in <14 sec for improved mobility and decreased fall risk. Baseline:  Goal status: INITIAL  3.  Pt will ambulate to get her mail without significant difficulty.  Baseline:  Goal  status: INITIAL  4.  Pt will ambulate with a reciprocal gait with walker and report she is ambulating community distance safely, with good confidence, and without significant difficulty. Baseline:  Goal status: INITIAL  5.  Pt will demo improved strength in R hip flexion to 4+/5 and R knee extension to 5/5 MMT for improved tolerance with and performance of functional mobility.  Baseline:  Goal status: INITIAL    PLAN: PT FREQUENCY: 1-2x/week  PT DURATION: 8 weeks  PLANNED INTERVENTIONS: Therapeutic exercises, Therapeutic activity, Neuromuscular re-education,  Balance training, Gait training, Patient/Family education, Joint mobilization, Stair training, Aquatic Therapy, Electrical stimulation, Cryotherapy, Moist heat, Manual therapy, and Re-evaluation  PLAN FOR NEXT SESSION: continue Aquatic therapy     Stanton Kidney Tharon Aquas) Alexxis Mackert MPT 06/17/22 10:36 AM

## 2022-06-22 ENCOUNTER — Ambulatory Visit (HOSPITAL_BASED_OUTPATIENT_CLINIC_OR_DEPARTMENT_OTHER): Payer: Medicare Other | Admitting: Physical Therapy

## 2022-06-22 ENCOUNTER — Encounter (HOSPITAL_BASED_OUTPATIENT_CLINIC_OR_DEPARTMENT_OTHER): Payer: Self-pay | Admitting: Physical Therapy

## 2022-06-22 DIAGNOSIS — R278 Other lack of coordination: Secondary | ICD-10-CM | POA: Diagnosis not present

## 2022-06-22 DIAGNOSIS — R262 Difficulty in walking, not elsewhere classified: Secondary | ICD-10-CM | POA: Diagnosis not present

## 2022-06-22 DIAGNOSIS — R296 Repeated falls: Secondary | ICD-10-CM

## 2022-06-22 DIAGNOSIS — M6281 Muscle weakness (generalized): Secondary | ICD-10-CM

## 2022-06-22 NOTE — Therapy (Signed)
OUTPATIENT PHYSICAL THERAPY TREATMENT NOTE   Patient Name: Emily Horton MRN: [id number] DOB:03-Feb-1946, 76 y.o., female Today's Date: 06/22/2022    PT End of Session - 06/22/22 1617     Visit Number 10    Number of Visits 23    Date for PT Re-Evaluation 07/30/22    Authorization Type Medicare A and B; progress note completed visit 9    Progress Note Due on Visit 64    PT Start Time 1616    PT Stop Time 1657    PT Time Calculation (min) 41 min    Activity Tolerance Patient tolerated treatment well    Behavior During Therapy WFL for tasks assessed/performed              Past Medical History:  Diagnosis Date   Allergy    SEASONAL   Anxiety    Arthritis    RIGHT   Blood transfusion without reported diagnosis    "when had hip surgery"   Colon polyps 09/25/2007   Colonoscopy   Depression    Diverticulosis of colon (without mention of hemorrhage) 09/25/2007   Colonoscopy   GERD (gastroesophageal reflux disease)    Gynecological examination    sees Dr. Carren Rang    Hyperlipidemia    Osteopenia    Past Surgical History:  Procedure Laterality Date   COLONOSCOPY  12/30/2015   per Dr. Henrene Pastor, benign  polyps, repeat in 5  yrs.    ESOPHAGOGASTRODUODENOSCOPY  06/30/2012   per Dr. Sharlett Iles, reflux but no Barretts seen    HARDWARE REMOVAL Right 02/01/2020   Procedure: HARDWARE REMOVAL;  Surgeon: Mcarthur Rossetti, MD;  Location: [facility];  Service: Orthopedics;  Laterality: Right;   INTRAMEDULLARY (IM) NAIL INTERTROCHANTERIC Right 06/22/2019   INTRAMEDULLARY (IM) NAIL INTERTROCHANTERIC Right 06/22/2019   Procedure: INTRAMEDULLARY (IM) NAIL INTERTROCHANTRIC;  Surgeon: Altamese [place] View, MD;  Location: [place];  Service: Orthopedics;  Laterality: Right;   POLYPECTOMY     REVERSE SHOULDER ARTHROPLASTY Right 01/29/2020   Procedure: RIGHT REVERSE SHOULDER ARTHROPLASTY;  Surgeon: Marchia Bond, MD;  Location: [facility];  Service: Orthopedics;  Laterality: Right;   TOTAL HIP  ARTHROPLASTY Right 02/01/2020   Procedure: TOTAL HIP ARTHROPLASTY ANTERIOR APPROACH;  Surgeon: Mcarthur Rossetti, MD;  Location: [facility];  Service: Orthopedics;  Laterality: Right;   Patient Active Problem List   Diagnosis Date Noted   Generalized anxiety disorder 01/13/2021   Fall 04/30/2020   Osteoarthritis, multiple sites 04/30/2020   Pneumonia 03/11/2020   Muscle spasm 03/10/2020   Right flank pain 03/10/2020   Right shoulder pain 02/08/2020   Right hip pain 02/08/2020   Blood loss anemia 02/08/2020   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, right    Retained orthopedic hardware    Closed fracture of right proximal humerus 01/29/2020   Closed right hip fracture, initial encounter ([place]) 06/22/2019   Environmental and seasonal allergies 06/12/2018   Dyslipidemia 08/26/2017   IBS (irritable bowel syndrome) 08/26/2017   Angio-edema 11/03/2016   Diverticulosis of colon without hemorrhage 09/30/2015   Depression, recurrent ([place]) 08/22/2007   GERD 07/04/2007     REFERRING PROVIDER: Mcarthur Rossetti, MD   REFERRING DIAG: [phone] (ICD-10-CM) - Pain in right ankle and joints of right foot             Z96.641 (ICD-10-CM) - History of right hip replacement   THERAPY DIAG:  Muscle weakness (generalized)  Difficulty in walking, not elsewhere classified  Other  lack of coordination  Repeated falls  Rationale for Evaluation and Treatment Rehabilitation  ONSET DATE: MD visit on 03/31/2022  SUBJECTIVE:   SUBJECTIVE STATEMENT: Pt reports her RLE is stiff.  "And when I get upset, my right leg doesn't work".   "When I leave here I feel good, but it goes away fast."  She reports she has been doing exercises at counter.   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  No  Rating:  0/10  PRECAUTIONS: Other: R THR in 2021, lumbar and cervical stenosis, osteopenia, Reverse TSA in 2021  WEIGHT BEARING RESTRICTIONS No  FALLS:  Has patient fallen in last 6 months? Yes. Number of falls 3-4  LIVING ENVIRONMENT: Lives with: lives alone Lives in: 2 story home, but doesn't go up stairs Stairs:  Has following equipment at home: Single point cane, Environmental consultant - 2 wheeled, and Wheelchair (manual)  OCCUPATION: Pt is retired  PLOF: Independent.  Pt has regressed with mobility since prior PT ended.  Pt has someone help clean her home every 1-2 months.  PATIENT GOALS to be independent again, improve walking ability   OBJECTIVE:   DIAGNOSTIC FINDINGS:  X ray of pelvis shows well-seated total hip arthroplasty with no complicating  features.  X rays in January 2022 of the right ankle show no acute findings.   Lumbar MRI in August 2022:  IMPRESSION: Multilevel lumbar disc and facet degeneration with moderate to severe spinal stenosis at L3-4 and moderate spinal stenosis at L2-3.  PATIENT SURVEYS:  FOTO 31 with a goal of 43 at visit #15   06/04/22: 30  COGNITION:  Overall cognitive status: Within functional limits for tasks assessed      LOWER EXTREMITY MMT:  MMT Right eval Left eval Right/Left 06/04/22 Right 7/13  Hip flexion 4/5 5/5 4/5 - 5/5 4+  Hip extension      Hip abduction      Hip adduction      Hip internal rotation      Hip external rotation 4-/5 4+/5 4-/5 - 5-/5   Knee flexion      Knee extension 4/5 5/5 4+/5 - 5/5 5-  Ankle dorsiflexion 5/5 5/5    Ankle plantarflexion WFL tested in sitting WFL Tested in sitting    Ankle inversion      Ankle eversion       (Blank rows = not tested)    FUNCTIONAL TESTS:  Timed up and go (TUG): 33 sec with FWW    30s 06/04/22  5x STS:  16 sec, decreased control lowering to chair.  Pt did not use UE's. Pt able to perform without UE support/ 06/04/22: 5x STS Needed UE support  22sec  GAIT:(eval)  Assistive device utilized: Walker - 2 wheeled Comments: step to to step thru gait pattern with L LE leading; decreased step length bilat, very slow gait speed, requires walker.  Pt is very slow with turning.    TODAY'S TREATMENT: Pt seen for aquatic therapy today.  Treatment took place in water 3.25-4 ft in depth at the [facility] pool. Temp of water was 91.  Pt entered/exited the pool via stairs with step-to pattern forward, bilat rail and supervision.  She requires SBA-CGA for transfers to/from [facility] with cues for continued stepping towards WC prior to sitting.   * Therapist in water with patient entire treatment -  with CGA to close SBA.  * Holding white barbell  - multiple laps of forward gait cues for forward trunk lean (to neutral instead of posterior trunk lean) step length, heel strike and toe off. Backward amb x 3 laps; side stepping R/L - cues to slow down and bring legs together into adduction (vs shuffle). * Holding wall:  heel/toe raises; hip abdct/ add;  R/L  hip ext; Marching with high knees; side stepping R/L with cues for full adduction  * Holding white barbell: row without CGA x 12  * straddling yellow noodle and holding corner wall - cycling and hip abdct/ add with min A to keep trunk upright (vs post lean)  * Holding wall:  Rt quad stretch (passive, therapist lifting lower leg) x 2 reps  * seated on bench in water- Rt calf stretch x 20s x 3; STS x 5 without support  Pt requires buoyancy for support and to offload joints with strengthening exercises. Viscosity of the water is needed for resistance of strengthening; water current perturbations provides challenge to standing balance unsupported, requiring increased core activation.    PATIENT EDUCATION:  Education details:  aquatic therapy progression.   Person educated: Patient and daughter Education method: Explanation Education comprehension: verbalized understanding   HOME EXERCISE  PROGRAM: Previous HEP :  [id number]  ASSESSMENT:  CLINICAL IMPRESSION: Pt initially shuffles in pool; improved gait with cues.  Continues to require UE support on floatation device and intermittent CGA due to frequent posterior lean.  She required min A to maintain upright position while sitting on yellow noodle with feet suspended in deeper water.  Safety and confidence in pool has improved, but she continues to require assistance submerged. She will continue to benefit from skilled PT to further improve safety and function.   OBJECTIVE IMPAIRMENTS Abnormal gait, decreased activity tolerance, decreased balance, decreased endurance, decreased mobility, difficulty walking, decreased strength, and pain.   ACTIVITY LIMITATIONS standing, stairs, and locomotion level  PARTICIPATION LIMITATIONS: cleaning, driving, shopping, and community activity  PERSONAL FACTORS 3+ comorbidities: lumbar spinal stenosis and cervical stenosis, Osteopenia, anxiety, Reverse total shoulder arthroplasty    are also affecting patient's functional outcome.   REHAB POTENTIAL: Good  CLINICAL DECISION MAKING: Evolving/moderate complexity  EVALUATION COMPLEXITY: Moderate   GOALS:   SHORT TERM GOALS: Target date: 05/18/2022  Pt will tolerate aquatic therapy without adverse effects for improved mobility, function, and strength.  Baseline: Goal status: Achieved  2.  Pt will improve her TUG time by at least 10 sec for improved mobility.  Baseline:  Goal status: Ongoing  3.  Pt will report improved confidence with mobility and balance. Baseline:  Goal status: Achieved  4.  Pt will demo improved speed with gait.  Baseline:  Goal status: Achieved    LONG TERM GOALS: Target date: 07/30/2022  Pt will report she is able to ambulate to her cane independently without difficulty. Baseline:  Goal status: INITIAL  2.  Pt will perform TUG test in <14 sec for improved mobility and decreased fall risk. Baseline:   Goal status: INITIAL  3.  Pt will ambulate to get her mail without significant difficulty.  Baseline:  Goal status: INITIAL  4.  Pt will ambulate with a reciprocal gait with walker and report she is ambulating community distance safely, with good confidence, and without significant difficulty. Baseline:  Goal status: INITIAL  5.  Pt will demo improved strength in R hip flexion to 4+/5 and R knee extension to 5/5 MMT for improved  tolerance with and performance of functional mobility.  Baseline:  Goal status: INITIAL    PLAN: PT FREQUENCY: 1-2x/week  PT DURATION: 8 weeks  PLANNED INTERVENTIONS: Therapeutic exercises, Therapeutic activity, Neuromuscular re-education, Balance training, Gait training, Patient/Family education, Joint mobilization, Stair training, Aquatic Therapy, Electrical stimulation, Cryotherapy, Moist heat, Manual therapy, and Re-evaluation  PLAN FOR NEXT SESSION: continue Aquatic therapy     Kerin Perna, PTA 06/22/22 5:44 PM

## 2022-07-01 ENCOUNTER — Other Ambulatory Visit: Payer: Medicare Other

## 2022-07-05 ENCOUNTER — Ambulatory Visit
Admission: RE | Admit: 2022-07-05 | Discharge: 2022-07-05 | Disposition: A | Discharge status: Home / Self Care | Payer: Medicare Other | Source: Ambulatory Visit | Attending: Family Medicine | Admitting: Family Medicine

## 2022-07-05 ENCOUNTER — Other Ambulatory Visit: Payer: Self-pay | Admitting: Family Medicine

## 2022-07-05 DIAGNOSIS — N6314 Unspecified lump in the right breast, lower inner quadrant: Secondary | ICD-10-CM

## 2022-07-05 DIAGNOSIS — R928 Other abnormal and inconclusive findings on diagnostic imaging of breast: Secondary | ICD-10-CM | POA: Diagnosis not present

## 2022-07-05 DIAGNOSIS — N6310 Unspecified lump in the right breast, unspecified quadrant: Secondary | ICD-10-CM

## 2022-07-05 DIAGNOSIS — N6312 Unspecified lump in the right breast, upper inner quadrant: Secondary | ICD-10-CM | POA: Diagnosis not present

## 2022-07-06 DIAGNOSIS — C801 Malignant (primary) neoplasm, unspecified: Secondary | ICD-10-CM

## 2022-07-06 HISTORY — DX: Malignant (primary) neoplasm, unspecified: C80.1

## 2022-07-07 ENCOUNTER — Ambulatory Visit (HOSPITAL_BASED_OUTPATIENT_CLINIC_OR_DEPARTMENT_OTHER): Payer: Medicare Other | Attending: Orthopaedic Surgery | Admitting: Physical Therapy

## 2022-07-07 ENCOUNTER — Ambulatory Visit (HOSPITAL_BASED_OUTPATIENT_CLINIC_OR_DEPARTMENT_OTHER): Payer: Self-pay | Admitting: Physical Therapy

## 2022-07-07 ENCOUNTER — Encounter (HOSPITAL_BASED_OUTPATIENT_CLINIC_OR_DEPARTMENT_OTHER): Payer: Self-pay | Admitting: Physical Therapy

## 2022-07-07 DIAGNOSIS — R262 Difficulty in walking, not elsewhere classified: Secondary | ICD-10-CM | POA: Insufficient documentation

## 2022-07-07 DIAGNOSIS — M6281 Muscle weakness (generalized): Secondary | ICD-10-CM | POA: Diagnosis not present

## 2022-07-07 DIAGNOSIS — R278 Other lack of coordination: Secondary | ICD-10-CM | POA: Diagnosis not present

## 2022-07-07 DIAGNOSIS — R296 Repeated falls: Secondary | ICD-10-CM | POA: Insufficient documentation

## 2022-07-07 NOTE — Therapy (Addendum)
OUTPATIENT PHYSICAL THERAPY TREATMENT NOTE  PHYSICAL THERAPY DISCHARGE SUMMARY  Visits from Start of Care: 11  Current functional level related to goals / functional outcomes: Pt continues to need ad with functional mobility and occasional assistance    Remaining deficits: Pain, decreased strength   Education / Equipment: Discussed eval findings, rehab rationale and POC and patient is in agreement    Patient agrees to discharge. Patient goals were partially met. Patient is being discharged due to a change in medical status.  Patient Name: Emily Horton MRN: [id number] DOB:January 20, 1946, 76 y.o., female Today's Date: 07/07/2022    PT End of Session - 07/07/22 1344     Visit Number 11    Number of Visits 23    Date for PT Re-Evaluation 07/30/22    Authorization Type Medicare A and B; progress note completed at visit 9    Progress Note Due on Visit 19    PT Start Time 1120    PT Stop Time 1200    PT Time Calculation (min) 40 min    Activity Tolerance Patient tolerated treatment well    Behavior During Therapy [facility] for tasks assessed/performed               Past Medical History:  Diagnosis Date   Allergy    SEASONAL   Anxiety    Arthritis    RIGHT   Blood transfusion without reported diagnosis    "when had hip surgery"   Colon polyps 09/25/2007   Colonoscopy   Depression    Diverticulosis of colon (without mention of hemorrhage) 09/25/2007   Colonoscopy   GERD (gastroesophageal reflux disease)    Gynecological examination    sees Dr. Carren Rang    Hyperlipidemia    Osteopenia    Past Surgical History:  Procedure Laterality Date   COLONOSCOPY  12/30/2015   per Dr. Henrene Pastor, benign  polyps, repeat in 5  yrs.    ESOPHAGOGASTRODUODENOSCOPY  06/30/2012   per Dr. Sharlett Iles, reflux but no Barretts seen    HARDWARE REMOVAL Right 02/01/2020   Procedure: HARDWARE REMOVAL;  Surgeon: Mcarthur Rossetti, MD;  Location: [facility];  Service: Orthopedics;  Laterality: Right;    INTRAMEDULLARY (IM) NAIL INTERTROCHANTERIC Right 06/22/2019   INTRAMEDULLARY (IM) NAIL INTERTROCHANTERIC Right 06/22/2019   Procedure: INTRAMEDULLARY (IM) NAIL INTERTROCHANTRIC;  Surgeon: Altamese White [place], MD;  Location: [place];  Service: Orthopedics;  Laterality: Right;   POLYPECTOMY     REVERSE SHOULDER ARTHROPLASTY Right 01/29/2020   Procedure: RIGHT REVERSE SHOULDER ARTHROPLASTY;  Surgeon: Marchia Bond, MD;  Location: [facility];  Service: Orthopedics;  Laterality: Right;   TOTAL HIP ARTHROPLASTY Right 02/01/2020   Procedure: TOTAL HIP ARTHROPLASTY ANTERIOR APPROACH;  Surgeon: Mcarthur Rossetti, MD;  Location: [facility];  Service: Orthopedics;  Laterality: Right;   Patient Active Problem List   Diagnosis Date Noted   Generalized anxiety disorder 01/13/2021   Fall 04/30/2020   Osteoarthritis, multiple sites 04/30/2020   Pneumonia 03/11/2020   Muscle spasm 03/10/2020   Right flank pain 03/10/2020   Right shoulder pain 02/08/2020   Right hip pain 02/08/2020   Blood loss anemia 02/08/2020   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, right    Retained orthopedic hardware    Closed fracture of right proximal humerus 01/29/2020   Closed right hip fracture, initial encounter ([place]) 06/22/2019   Environmental and seasonal allergies 06/12/2018   Dyslipidemia 08/26/2017   IBS (irritable bowel syndrome) 08/26/2017   Angio-edema  11/03/2016   Diverticulosis of colon without hemorrhage 09/30/2015   Depression, recurrent ([place]) 08/22/2007   GERD 07/04/2007     REFERRING PROVIDER: Mcarthur Rossetti, MD   REFERRING DIAG: [phone] (ICD-10-CM) - Pain in right ankle and joints of right foot             Z96.641 (ICD-10-CM) - History of right hip replacement   THERAPY DIAG:  Muscle weakness (generalized)  Difficulty in walking, not elsewhere classified  Other lack of coordination  Repeated falls  Rationale for Evaluation and Treatment  Rehabilitation  ONSET DATE: MD visit on 03/31/2022  SUBJECTIVE:   SUBJECTIVE STATEMENT: Pt reports she is anxious about having a breast biopsy next week.  She reports that her anxiety affects the quality of her gait.  "I hate that I missed a week in the pool, but I'll take what I can get". She reports increased stiffness in body since having a week off of PT.   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 No  Rating:  0/10  PRECAUTIONS: Other: R THR in 2021, lumbar and cervical stenosis, osteopenia, Reverse TSA in 2021  WEIGHT BEARING RESTRICTIONS No  FALLS:  Has patient fallen in last 6 months? Yes. Number of falls 3-4  LIVING ENVIRONMENT: Lives with: lives alone Lives in: 2 story home, but doesn't go up stairs Stairs:  Has following equipment at home: Single point cane, Environmental consultant - 2 wheeled, and Wheelchair (manual)  OCCUPATION: Pt is retired  PLOF: Independent.  Pt has regressed with mobility since prior PT ended.  Pt has someone help clean her home every 1-2 months.  PATIENT GOALS to be independent again, improve walking ability   OBJECTIVE:   DIAGNOSTIC FINDINGS:  X ray of pelvis shows well-seated total hip arthroplasty with no complicating  features.  X rays in January 2022 of the right ankle show no acute findings.   Lumbar MRI in August 2022:  IMPRESSION: Multilevel lumbar disc and facet degeneration with moderate to severe spinal stenosis at L3-4 and moderate spinal stenosis at L2-3.  PATIENT SURVEYS:  FOTO 31 with a goal of 43 at visit #15   06/04/22: 30  COGNITION:  Overall cognitive status: Within functional limits for tasks assessed      LOWER EXTREMITY MMT:  MMT Right eval Left eval Right/Left 06/04/22 Right 7/13  Hip flexion 4/5 5/5 4/5 - 5/5 4+  Hip extension      Hip abduction       Hip adduction      Hip internal rotation      Hip external rotation 4-/5 4+/5 4-/5 - 5-/5   Knee flexion      Knee extension 4/5 5/5 4+/5 - 5/5 5-  Ankle dorsiflexion 5/5 5/5    Ankle plantarflexion WFL tested in sitting WFL Tested in sitting    Ankle inversion      Ankle eversion       (Blank rows = not tested)    FUNCTIONAL TESTS:  Timed up and go (TUG): 33 sec with FWW    30s 06/04/22  5x STS:  16 sec, decreased control lowering to chair.  Pt did not use UE's. Pt able to perform without UE support/ 06/04/22: 5x STS Needed UE support 22sec  GAIT:(eval)  Assistive device utilized: Walker - 2 wheeled Comments: step to to step thru gait pattern  with L LE leading; decreased step length bilat, very slow gait speed, requires walker.  Pt is very slow with turning.    TODAY'S TREATMENT: Pt seen for aquatic therapy today.  Treatment took place in water 3.25-4 ft in depth at the [facility] pool. Temp of water was 90.  Pt entered/exited the pool via stairs with step-to pattern forward, bilat rail and supervision.  She requires SBA for transfers to/from [facility].   * Therapist in water with patient during gait with SBA and occasional CGA to steady.  * Holding wall:  heel/toe raises; hip abdct/ add;  R/L  hip ext; Marching with high knees; side stepping R/L with cues for full adduction  * Holding white barbell  - multiple laps of forward gait with cues for increased L step length, heel strike and toe off. Backward amb x 4 laps * Seated on bench with blue step under feet:  sit to/from stand with intermittent HHA to steady; cycling; hip abdct/ add  * TUG like exercise holding white floatation barbell x 4  (from bench in water)  * Holding wall:  forward step ups on 5" step with RLE * Holding wall:  Rt quad stretch ( therapist assisting to position thin squoodle for LE support) x 2 reps;  Rt hamstring stretch with leg supported by squoodle   Pt requires buoyancy for support and to  offload joints with strengthening exercises. Viscosity of the water is needed for resistance of strengthening; water current perturbations provides challenge to standing balance unsupported, requiring increased core activation.    PATIENT EDUCATION:  Education details:  aquatic therapy progression.   Person educated: Patient and daughter Education method: Explanation Education comprehension: verbalized understanding   HOME EXERCISE PROGRAM: Previous HEP :  [id number]  ASSESSMENT:  CLINICAL IMPRESSION: Wave was able to complete gait with SBA with therapist near side vs requiring CGA as she has in previous sessions.  She did require intermittent HHA to steady with Sit to/from stand.  Overall, good improvement in mobility since last visit. Progressing gradually towards remaining goals. She will continue to benefit from skilled PT to further improve safety and function.   OBJECTIVE IMPAIRMENTS Abnormal gait, decreased activity tolerance, decreased balance, decreased endurance, decreased mobility, difficulty walking, decreased strength, and pain.   ACTIVITY LIMITATIONS standing, stairs, and locomotion level  PARTICIPATION LIMITATIONS: cleaning, driving, shopping, and community activity  PERSONAL FACTORS 3+ comorbidities: lumbar spinal stenosis and cervical stenosis, Osteopenia, anxiety, Reverse total shoulder arthroplasty    are also affecting patient's functional outcome.   REHAB POTENTIAL: Good  CLINICAL DECISION MAKING: Evolving/moderate complexity  EVALUATION COMPLEXITY: Moderate   GOALS:   SHORT TERM GOALS: Target date: 05/18/2022  Pt will tolerate aquatic therapy without adverse effects for improved mobility, function, and strength.  Baseline: Goal status: Achieved  2.  Pt will improve her TUG time by at least 10 sec for improved mobility.  Baseline:  Goal status: Ongoing  3.  Pt will report improved confidence with mobility and balance. Baseline:  Goal status:  Achieved  4.  Pt will demo improved speed with gait.  Baseline:  Goal status: Achieved    LONG TERM GOALS: Target date: 07/30/2022  Pt will report she is able to ambulate to her cane independently without difficulty. Baseline:  Goal status: INITIAL  2.  Pt will perform TUG test in <14 sec for improved mobility and decreased fall risk. Baseline:  Goal status: INITIAL  3.  Pt will ambulate to get her mail without significant  difficulty.  Baseline:  Goal status: INITIAL  4.  Pt will ambulate with a reciprocal gait with walker and report she is ambulating community distance safely, with good confidence, and without significant difficulty. Baseline:  Goal status: INITIAL  5.  Pt will demo improved strength in R hip flexion to 4+/5 and R knee extension to 5/5 MMT for improved tolerance with and performance of functional mobility.  Baseline:  Goal status: INITIAL    PLAN: PT FREQUENCY: 1-2x/week  PT DURATION: 8 weeks  PLANNED INTERVENTIONS: Therapeutic exercises, Therapeutic activity, Neuromuscular re-education, Balance training, Gait training, Patient/Family education, Joint mobilization, Stair training, Aquatic Therapy, Electrical stimulation, Cryotherapy, Moist heat, Manual therapy, and Re-evaluation  PLAN FOR NEXT SESSION: continue Aquatic therapy     Kerin Perna, PTA 07/07/22 1:54 PM  Addended 08/27/22 Annamarie Major) [place] MPT

## 2022-07-09 ENCOUNTER — Ambulatory Visit (HOSPITAL_BASED_OUTPATIENT_CLINIC_OR_DEPARTMENT_OTHER): Payer: Medicare Other | Admitting: Physical Therapy

## 2022-07-15 ENCOUNTER — Ambulatory Visit
Admission: RE | Admit: 2022-07-15 | Discharge: 2022-07-15 | Disposition: A | Discharge status: Home / Self Care | Payer: Medicare Other | Source: Ambulatory Visit | Attending: Family Medicine | Admitting: Family Medicine

## 2022-07-15 DIAGNOSIS — N6313 Unspecified lump in the right breast, lower outer quadrant: Secondary | ICD-10-CM | POA: Diagnosis not present

## 2022-07-15 DIAGNOSIS — N6310 Unspecified lump in the right breast, unspecified quadrant: Secondary | ICD-10-CM

## 2022-07-15 DIAGNOSIS — N6314 Unspecified lump in the right breast, lower inner quadrant: Secondary | ICD-10-CM | POA: Diagnosis not present

## 2022-07-15 DIAGNOSIS — D493 Neoplasm of unspecified behavior of breast: Secondary | ICD-10-CM | POA: Diagnosis not present

## 2022-07-29 ENCOUNTER — Other Ambulatory Visit: Payer: Self-pay | Admitting: General Surgery

## 2022-07-29 DIAGNOSIS — D0511 Intraductal carcinoma in situ of right breast: Secondary | ICD-10-CM

## 2022-07-30 ENCOUNTER — Telehealth: Payer: Self-pay | Admitting: Hematology and Oncology

## 2022-07-30 NOTE — Telephone Encounter (Signed)
Scheduled appt per 8/25 staff msg from navigator. Pt is aware of appt date and time. Pt is aware to arrive 15 mins prior to appt time and to bring and updated insurance card. Pt is aware of appt location.

## 2022-08-02 ENCOUNTER — Other Ambulatory Visit: Payer: Self-pay | Admitting: General Surgery

## 2022-08-02 ENCOUNTER — Other Ambulatory Visit: Payer: Self-pay

## 2022-08-02 ENCOUNTER — Inpatient Hospital Stay: Payer: Medicare Other | Attending: Hematology and Oncology | Admitting: Hematology and Oncology

## 2022-08-02 ENCOUNTER — Encounter: Payer: Self-pay | Admitting: Hematology and Oncology

## 2022-08-02 ENCOUNTER — Telehealth: Payer: Self-pay | Admitting: Hematology and Oncology

## 2022-08-02 DIAGNOSIS — D0581 Other specified type of carcinoma in situ of right breast: Secondary | ICD-10-CM | POA: Diagnosis not present

## 2022-08-02 DIAGNOSIS — D0511 Intraductal carcinoma in situ of right breast: Secondary | ICD-10-CM | POA: Diagnosis not present

## 2022-08-02 NOTE — Progress Notes (Signed)
[facility] CONSULT NOTE  Patient Care Team: Laurey Morale, MD as PCP - General (Family Medicine)  CHIEF COMPLAINTS/PURPOSE OF CONSULTATION:  Newly diagnosed breast cancer  HISTORY OF PRESENTING ILLNESS:  Emily Horton 76 y.o. female is here because of recent diagnosis of right breast papillary carcinoma in situ  I reviewed her records extensively and collaborated the history with the patient.  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  Tissue sampling of both of these masses recommended.  No abnormal appearing right axillary lymph nodes.   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  Prognostic showed strong positivity in estrogen and progesterone at both sites.    She is here for an initial visit with her daughter.  She denies any new health complaints although she is very anxious about the upcoming biopsies and surgery.  She tells me that she has underlying anxiety and she is always stressed.  Besides the breast cancer, she has severe arthritis, had hip replacements and is very limited from the mobility standpoint. She otherwise has osteoporosis and was on Prolia, last infusion in January.  Family history of breast cancer in maternal grandmother.  She has 2 children, her daughter is with her today.  Rest of the pertinent 10 point ROS reviewed and negative  MEDICAL HISTORY:  Past Medical History:  Diagnosis Date   Allergy    SEASONAL   Anxiety    Arthritis    RIGHT   Blood transfusion without reported  diagnosis    "when had hip surgery"   Colon polyps 09/25/2007   Colonoscopy   Depression    Diverticulosis of colon (without mention of hemorrhage) 09/25/2007   Colonoscopy   GERD (gastroesophageal reflux disease)    Gynecological examination    sees Dr. Carren Rang    Hyperlipidemia    Osteopenia     SURGICAL HISTORY: Past Surgical History:  Procedure Laterality Date   COLONOSCOPY  12/30/2015   per Dr. Henrene Pastor, benign  polyps, repeat in 5  yrs.    ESOPHAGOGASTRODUODENOSCOPY  06/30/2012   per Dr. Sharlett Iles, reflux but no Barretts seen    HARDWARE REMOVAL Right 02/01/2020   Procedure: HARDWARE REMOVAL;  Surgeon: Mcarthur Rossetti, MD;  Location: [facility];  Service: Orthopedics;  Laterality: Right;   INTRAMEDULLARY (IM) NAIL INTERTROCHANTERIC Right 06/22/2019   INTRAMEDULLARY (IM) NAIL INTERTROCHANTERIC Right 06/22/2019   Procedure: INTRAMEDULLARY (IM) NAIL INTERTROCHANTRIC;  Surgeon: Altamese Manchester, MD;  Location: [facility];  Service: Orthopedics;  Laterality: Right;   POLYPECTOMY     REVERSE SHOULDER ARTHROPLASTY Right 01/29/2020   Procedure: RIGHT REVERSE SHOULDER ARTHROPLASTY;  Surgeon: Marchia Bond, MD;  Location: [facility];  Service: Orthopedics;  Laterality: Right;   TOTAL HIP ARTHROPLASTY Right 02/01/2020   Procedure: TOTAL HIP ARTHROPLASTY ANTERIOR APPROACH;  Surgeon: Mcarthur Rossetti, MD;  Location: [facility];  Service: Orthopedics;  Laterality: Right;    SOCIAL HISTORY: Social History   Socioeconomic History   Marital status: Widowed    Spouse name: Not on file   Number of children: 2  Years of education: Not on file   Highest education level: Not on file  Occupational History   Occupation: retired Pharmacist, hospital  Tobacco Use   Smoking status: Every Day    Packs/day: 0.50    Years: 30.00    Total pack years: 15.00    Types: Cigarettes    Passive exposure: Past   Smokeless tobacco: Never   Tobacco comments:    or less  Vaping Use   Vaping Use: Never used   Substance and Sexual Activity   Alcohol use: Yes    Alcohol/week: 0.0 standard drinks of alcohol    Comment: occasional   Drug use: No   Sexual activity: Not on file  Other Topics Concern   Not on file  Social History Narrative   Right handed   Social Determinants of Health   Financial Resource Strain: Low Risk  (05/11/2022)   Overall Financial Resource Strain (CARDIA)    Difficulty of Paying Living Expenses: Not hard at all  Food Insecurity: No Food Insecurity (05/11/2022)   Hunger Vital Sign    Worried About Running Out of Food in the Last Year: Never true    Ran Out of Food in the Last Year: Never true  Transportation Needs: No Transportation Needs (05/11/2022)   PRAPARE - Hydrologist (Medical): No    Lack of Transportation (Non-Medical): No  Physical Activity: Inactive (05/11/2022)   Exercise Vital Sign    Days of Exercise per Week: 0 days    Minutes of Exercise per Session: 0 min  Stress: No Stress Concern Present (05/11/2022)   [place]    Feeling of Stress : Not at all  Social Connections: Moderately Integrated (05/11/2022)   Social Connection and Isolation Panel [NHANES]    Frequency of Communication with Friends and Family: More than three times a week    Frequency of Social Gatherings with Friends and Family: More than three times a week    Attends Religious Services: More than 4 times per year    Active Member of [employer] or Organizations: Yes    Attends Archivist Meetings: 1 to 4 times per year    Marital Status: Widowed  Intimate Partner Violence: Not At Risk (05/11/2022)   Humiliation, Afraid, Rape, and Kick questionnaire    Fear of Current or Ex-Partner: No    Emotionally Abused: No    Physically Abused: No    Sexually Abused: No    FAMILY HISTORY: Family History  Problem Relation Age of Onset   Crohn's disease Father    Colon polyps Father    Breast cancer  Maternal Grandmother    Stroke Paternal Grandfather    Colon cancer Neg Hx    Esophageal cancer Neg Hx    Rectal cancer Neg Hx    Stomach cancer Neg Hx     ALLERGIES:  is allergic to eggs or egg-derived products and zoloft [sertraline hcl].  MEDICATIONS:  Current Outpatient Medications  Medication Sig Dispense Refill   buPROPion (WELLBUTRIN XL) 300 MG 24 hr tablet TAKE 1 TABLET(300 MG) BY MOUTH EVERY MORNING 90 tablet 3   busPIRone (BUSPAR) 15 MG tablet TAKE 1 TABLET(15 MG) BY MOUTH TWICE DAILY 60 tablet 1   cetirizine (ZYRTEC) 10 MG tablet TAKE 1 TABLET(10 MG) BY MOUTH DAILY 90 tablet 3   EPINEPHrine 0.3 mg/0.3 mL IJ SOAJ injection Inject 0.3 mLs (0.3 mg total) into the muscle daily as needed for  anaphylaxis. (Patient not taking: Reported on 03/09/2022) 1 each 0   hyoscyamine (LEVSIN SL) 0.125 MG SL tablet Place 1 tablet (0.125 mg total) under the tongue every 4 (four) hours as needed. (Patient not taking: Reported on 03/09/2022) 60 tablet 11   meloxicam (MOBIC) 15 MG tablet Take 1 tablet (15 mg total) by mouth daily. (Patient taking differently: Take 15 mg by mouth every other day.) 90 tablet 3   Menthol, Topical Analgesic, (BIOFREEZE) 4 % GEL Apply topically. APPLY TO RIGHT SHOULDER Twice A Day - PRN     omeprazole (PRILOSEC) 40 MG capsule Take 1 capsule (40 mg total) by mouth 2 (two) times daily. 180 capsule 3   ondansetron (ZOFRAN) 4 MG tablet Take 1 tablet (4 mg total) by mouth every 8 (eight) hours as needed for nausea or vomiting. 10 tablet 0   polyethylene glycol (MIRALAX / GLYCOLAX) 17 g packet Take 17 g by mouth daily as needed for moderate constipation or severe constipation. 14 each 0   sennosides-docusate sodium (SENOKOT-S) 8.6-50 MG tablet Take 2 tablets by mouth daily.     triamcinolone cream (KENALOG) 0.1 % Apply 1 application topically 2 (two) times daily. 453.6 g 3   No current facility-administered medications for this visit.    REVIEW OF SYSTEMS:   Constitutional:  Denies fevers, chills or abnormal night sweats Eyes: Denies blurriness of vision, double vision or watery eyes Ears, nose, mouth, throat, and face: Denies mucositis or sore throat Respiratory: Denies cough, dyspnea or wheezes Cardiovascular: Denies palpitation, chest discomfort or lower extremity swelling Gastrointestinal:  Denies nausea, heartburn or change in bowel habits Skin: Denies abnormal skin rashes Lymphatics: Denies new lymphadenopathy or easy bruising Neurological:Denies numbness, tingling or new weaknesses Behavioral/Psych: Mood is stable, no new changes  Breast: Denies any palpable lumps or discharge All other systems were reviewed with the patient and are negative.  PHYSICAL EXAMINATION: ECOG PERFORMANCE STATUS: 0 - Asymptomatic  Vitals:   08/02/22 1350  BP: 130/84  Pulse: 99  Resp: 19  Temp: 97.7 F (36.5 C)  SpO2: 99%   Filed Weights   08/02/22 1350  Weight: 199 lb 14.4 oz (90.7 kg)    GENERAL:alert, no distress and comfortable, anxious NECK: supple, thyroid normal size, non-tender, without nodularity LYMPH:  no palpable lymphadenopathy in the cervical, axillary Musculoskeletal: Walks with a walker.  Limited range of movement PSYCH: alert & oriented x 3 with fluent speech NEURO: no focal motor/sensory deficits BREAST: Palpable breast nodule in the right breast.  2 separate nodules at 3:00 and 4 o'clock position felt adjacent to each other.  No palpable regional adenopathy  LABORATORY DATA:  I have reviewed the data as listed Lab Results  Component Value Date   WBC 6.7 05/27/2020   HGB 13.2 05/27/2020   HCT 40 05/27/2020   MCV 94.4 02/05/2020   PLT 252 05/27/2020   Lab Results  Component Value Date   NA 138 05/27/2020   K 4.2 05/27/2020   CL 103 05/27/2020   CO2 27 (A) 05/27/2020    RADIOGRAPHIC STUDIES: I have personally reviewed the radiological reports and agreed with the findings in the report.  ASSESSMENT AND PLAN:  Papillary carcinoma  in situ of right breast This is a very pleasant 76 year old postmenopausal female patient with newly diagnosed self palpated right breast papillary carcinoma in situ at 2 locations, there is another abnormal thyroid area which is yet to be biopsied followed by Dr. Marlou Starks at [facility] surgery who is now referred  to medical oncology for additional recommendations.  We have discussed about the pathology in detail as well as the ER/PR staining on this tumor specimen.  We have discussed the following details about antiestrogen therapy.We have discussed options for antiestrogen therapy today  With regards to Tamoxifen, we discussed that this is a SERM, selective estrogen receptor modulator. We discussed mechanism of action of Tamoxifen, adverse effects on Tamoxifen including but not limited to post menopausal symptoms, increased risk of DVT/PE, increased risk of endometrial cancer, questionable cataracts with long term use and increased risk of cardiovascular events in the study which was not statistically significant. A benefit from Tamoxifen would be improvement in bone density. With regards to aromatase inhibitors, we discussed mechanism of action, adverse effects including but not limited to post menopausal symptoms, arthralgias, myalgias, increased risk of cardiovascular events and bone loss.   Given her sedentary lifestyle and risk of for DVT/PE, I have recommended considering aromatase inhibitors for antiestrogen therapy.  She has underlying osteoporosis and is on Prolia for osteoporosis management.  She will return to clinic in mid October after surgery to review final pathology.  We have once again discussed about the need for MRI, additional biopsies, type of surgery lumpectomy versus mastectomy followed by adjuvant therapy.  She expressed understanding of all the recommendations.  Thank you for consulting [place] the care of this patient.  Please do not hesitate to contact us with any additional  questions or concerns.    Total time spent: 45 minutes including history, physical exam, review of records, counseling and coordination of care All questions were answered. The patient knows to call the clinic with any problems, questions or concerns.    Benay Pike, MD 08/02/22

## 2022-08-02 NOTE — Telephone Encounter (Signed)
Rescheduled appointment per providers schedule. Patient aware.

## 2022-08-02 NOTE — Assessment & Plan Note (Signed)
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 Marlou Starks at [facility] surgery who is now referred to medical oncology for additional recommendations.  We have discussed about the pathology in detail as well as the ER/PR staining on this tumor specimen.  We have discussed the following details about antiestrogen therapy.We have discussed options for antiestrogen therapy today  With regards to Tamoxifen, we discussed that this is a SERM, selective estrogen receptor modulator. We discussed mechanism of action of Tamoxifen, adverse effects on Tamoxifen including but not limited to post menopausal symptoms, increased risk of DVT/PE, increased risk of endometrial cancer, questionable cataracts with long term use and increased risk of cardiovascular events in the study which was not statistically significant. A benefit from Tamoxifen would be improvement in bone density. With regards to aromatase inhibitors, we discussed mechanism of action, adverse effects including but not limited to post menopausal symptoms, arthralgias, myalgias, increased risk of cardiovascular events and bone loss.   Given her sedentary lifestyle and risk of for DVT/PE, I have recommended considering aromatase inhibitors for antiestrogen therapy.  She has underlying osteoporosis and is on Prolia for osteoporosis management.  She will return to clinic in mid October after surgery to review final pathology.  We have once again discussed about the need for MRI, additional biopsies, type of surgery lumpectomy versus mastectomy followed by adjuvant therapy.  She expressed understanding of all the recommendations.  Thank you for consulting [place] the care of this patient.  Please do not hesitate to contact us with any additional questions or concerns.

## 2022-08-03 ENCOUNTER — Encounter: Payer: Self-pay | Admitting: *Deleted

## 2022-08-17 ENCOUNTER — Ambulatory Visit
Admission: RE | Admit: 2022-08-17 | Discharge: 2022-08-17 | Disposition: A | Discharge status: Home / Self Care | Payer: Medicare Other | Source: Ambulatory Visit | Attending: General Surgery | Admitting: General Surgery

## 2022-08-17 DIAGNOSIS — D0511 Intraductal carcinoma in situ of right breast: Secondary | ICD-10-CM | POA: Diagnosis not present

## 2022-08-17 MED ORDER — GADOBUTROL 1 MMOL/ML IV SOLN
8.0000 mL | Freq: Once | INTRAVENOUS | Status: AC | PRN
  Administered 2022-08-17: 8 mL via INTRAVENOUS

## 2022-08-18 ENCOUNTER — Encounter: Payer: Self-pay | Admitting: *Deleted

## 2022-08-24 ENCOUNTER — Other Ambulatory Visit: Payer: Medicare Other

## 2022-08-25 ENCOUNTER — Encounter: Payer: Self-pay | Admitting: *Deleted

## 2022-08-27 ENCOUNTER — Ambulatory Visit: Payer: Self-pay | Admitting: General Surgery

## 2022-08-27 DIAGNOSIS — D0511 Intraductal carcinoma in situ of right breast: Secondary | ICD-10-CM | POA: Diagnosis not present

## 2022-08-30 ENCOUNTER — Other Ambulatory Visit: Payer: Self-pay | Admitting: *Deleted

## 2022-08-30 DIAGNOSIS — D0581 Other specified type of carcinoma in situ of right breast: Secondary | ICD-10-CM

## 2022-09-01 NOTE — Therapy (Signed)
OUTPATIENT PHYSICAL THERAPY BREAST CANCER BASELINE EVALUATION   Patient Name: Emily Horton MRN: [id number] DOB:29-May-1946, 76 y.o., female Today's Date: 09/02/2022   PT End of Session - 09/02/22 1608     Visit Number 1    Number of Visits 2    Date for PT Re-Evaluation 10/14/22    PT Start Time 1509    PT Stop Time 1600    PT Time Calculation (min) 51 min    Activity Tolerance Patient tolerated treatment well    Behavior During Therapy [facility] for tasks assessed/performed             Past Medical History:  Diagnosis Date   Allergy    SEASONAL   Anxiety    Arthritis    RIGHT   Blood transfusion without reported diagnosis    "when had hip surgery"   Colon polyps 09/25/2007   Colonoscopy   Depression    Diverticulosis of colon (without mention of hemorrhage) 09/25/2007   Colonoscopy   GERD (gastroesophageal reflux disease)    Gynecological examination    sees Dr. Carren Rang    Hyperlipidemia    Osteopenia    Past Surgical History:  Procedure Laterality Date   COLONOSCOPY  12/30/2015   per Dr. Henrene Pastor, benign  polyps, repeat in 5  yrs.    ESOPHAGOGASTRODUODENOSCOPY  06/30/2012   per Dr. Sharlett Iles, reflux but no Barretts seen    HARDWARE REMOVAL Right 02/01/2020   Procedure: HARDWARE REMOVAL;  Surgeon: Mcarthur Rossetti, MD;  Location: [facility];  Service: Orthopedics;  Laterality: Right;   INTRAMEDULLARY (IM) NAIL INTERTROCHANTERIC Right 06/22/2019   INTRAMEDULLARY (IM) NAIL INTERTROCHANTERIC Right 06/22/2019   Procedure: INTRAMEDULLARY (IM) NAIL INTERTROCHANTRIC;  Surgeon: Altamese [place], MD;  Location: 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   Generalized anxiety disorder 01/13/2021   Fall 04/30/2020   Osteoarthritis, multiple sites 04/30/2020   Pneumonia 03/11/2020   Muscle spasm 03/10/2020   Right flank pain 03/10/2020   Right shoulder pain 02/08/2020   Right hip pain 02/08/2020   Blood loss anemia 02/08/2020   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, right    Retained orthopedic hardware    Closed fracture of right proximal humerus 01/29/2020   Closed right hip fracture, initial encounter ([place]) 06/22/2019   Environmental and seasonal allergies 06/12/2018   Dyslipidemia 08/26/2017   IBS (irritable bowel syndrome) 08/26/2017   Angio-edema 11/03/2016   Diverticulosis of colon without hemorrhage 09/30/2015   Depression, recurrent ([place]) 08/22/2007   GERD 07/04/2007    PCP: Laurey Morale, MD  REFERRING PROVIDER: Jovita Kussmaul, MD   REFERRING DIAG: D05.81 (ICD-10-CM) - 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   THERAPY DIAG:  Stiffness of right shoulder, not elsewhere classified - Plan: PT plan of care cert/re-cert  Abnormal posture - Plan: PT plan of care cert/re-cert  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   PERTINENT HISTORY:  Patient was diagnosed on 08/02/22 with right papillary neoplasm with features suggestive of low grade  papillar carcinoma in situ in both the right breast biopsies. Prognostic showed strong positivity in ER and PR at both sites.   PATIENT GOALS   reduce lymphedema risk and learn post op HEP.   PAIN:  Are you having pain? No   PRECAUTIONS: Active CA Other: R THA, R shoulder replacement, had R THA revision at the same time as R shoulder replacement Feb 2021  HAND DOMINANCE: right  WEIGHT BEARING RESTRICTIONS No  FALLS:  Has patient fallen in last 6 months? Yes. Number of falls 1 has life alert  LIVING ENVIRONMENT: Patient lives with: alone Lives in: House/apartment Has following equipment at home: Single point cane, Environmental consultant - 2 wheeled, Environmental consultant - 4 wheeled, Electronics engineer, and Grab bars  OCCUPATION: retired  LEISURE: pt is not exercising   PRIOR LEVEL OF FUNCTION: Requires assistive device for independence and Needs assistance with homemaking   OBJECTIVE  COGNITION:  Overall cognitive status: Within functional limits for tasks assessed    POSTURE:  Forward head and rounded shoulders posture  UPPER EXTREMITY AROM/PROM:  A/PROM RIGHT   eval   Shoulder extension 59  Shoulder flexion 131  Shoulder abduction 98  Shoulder internal rotation 40  Shoulder external rotation 47    (Blank rows = not tested)  A/PROM LEFT   eval  Shoulder extension 40  Shoulder flexion 157  Shoulder abduction 174  Shoulder internal rotation 45  Shoulder external rotation 70    (Blank rows = not tested)   CERVICAL AROM: All within normal limits:    Percent limited  Flexion WFL  Extension 25% limited  Right lateral flexion WFL  Left lateral flexion WFL  Right rotation 25% limited  Left rotation 25% limited     UPPER EXTREMITY STRENGTH: 3+/5 in available range   LYMPHEDEMA ASSESSMENTS:   LANDMARK RIGHT   eval  10 cm proximal to olecranon process 32.5  Olecranon process 28  10 cm proximal to ulnar styloid process 24.7  Just proximal to ulnar styloid process 17.8  Across hand  at thumb web space 19.6  At base of 2nd digit 6.5  (  Blank rows = not tested)  LANDMARK LEFT   eval  10 cm proximal to olecranon process 33.8  Olecranon process 27.6  10 cm proximal to ulnar styloid process 24  Just proximal to ulnar styloid process 16.8  Across hand at thumb web space 20  At base of 2nd digit 6.2  (Blank rows = not tested)   L-DEX LYMPHEDEMA SCREENING:  The patient was assessed using the L-Dex machine today to produce a lymphedema index baseline score. The patient will be reassessed on a regular basis (typically every 3 months) to obtain new L-Dex scores. If the score is > 6.5 points away from his/her baseline score indicating onset of subclinical lymphedema, it will be recommended to wear a compression garment for 4 weeks, 12 hours per day and then be reassessed. If the score continues to be > 6.5 points from baseline at reassessment, we will initiate lymphedema treatment. Assessing in this manner has a 95% rate of preventing clinically significant lymphedema.   L-DEX FLOWSHEETS - 09/02/22 1600       L-DEX LYMPHEDEMA SCREENING   Measurement Type Unilateral    L-DEX MEASUREMENT EXTREMITY Upper Extremity    POSITION  Standing    DOMINANT SIDE Right    At Risk Side Right    BASELINE SCORE (UNILATERAL) -2.4              QUICK DASH SURVEY:  Katina Dung - 09/02/22 0001     Open a tight or new jar Severe difficulty    Do heavy household chores (wash walls, wash floors) Severe difficulty    Carry a shopping bag or briefcase Severe difficulty    Wash your back Severe difficulty    Use a knife to cut food Moderate difficulty    Recreational activities in which you take some force or impact through your arm, shoulder, or hand (golf, hammering, tennis) Severe difficulty    During the past week, to what extent has your arm, shoulder or hand problem interfered with your normal social activities with family, friends, neighbors, or groups? Quite a bit    During the  past week, to what extent has your arm, shoulder or hand problem limited your work or other regular daily activities Modererately    Arm, shoulder, or hand pain. None    Tingling (pins and needles) in your arm, shoulder, or hand Mild    Difficulty Sleeping No difficulty    DASH Score 52.27 %              PATIENT EDUCATION:  Education details: Lymphedema risk reduction and post op shoulder/posture HEP Person educated: Patient Education method: Explanation, Demonstration, Handout Education comprehension: Patient verbalized understanding and returned demonstration   HOME EXERCISE PROGRAM: Patient was instructed today in a home exercise program today for post op shoulder range of motion. These included active assist shoulder flexion in sitting, scapular retraction, wall walking with shoulder abduction, and hands behind head external rotation.  She was encouraged to do these twice a day, holding 3 seconds and repeating 5 times when permitted by her physician.   ASSESSMENT:  CLINICAL IMPRESSION: Pt repots to PT with recently diagnosed R breast cancer and will be undergoing a R mastectomy and SLNB on 09/20/22. She has a history of R shoulder replacement in 2021 and was told her ROM would always be limited though pt reports it is more limited now than it used to be. She has difficulty with mobility and has since her R hip replacement. She  reports her doctor believes it is coming from her back.  She will benefit from a post op PT reassessment to determine needs and from L-Dex screens every 3 months for 2 years to detect subclinical lymphedema.  Pt will benefit from skilled therapeutic intervention to improve on the following deficits: Decreased knowledge of precautions, impaired UE functional use, pain, decreased ROM, postural dysfunction.   PT treatment/interventions: ADL/self-care home management, pt/family education, therapeutic exercise  REHAB POTENTIAL: Good  CLINICAL DECISION MAKING:  Stable/uncomplicated  EVALUATION COMPLEXITY: Low   GOALS: Goals reviewed with patient? YES  LONG TERM GOALS: (STG=LTG)    Name Target Date Goal status  1 Pt will be able to verbalize understanding of pertinent lymphedema risk reduction practices relevant to her dx specifically related to skin care.  Baseline:  No knowledge 09/01/2022 Achieved at eval  2 Pt will be able to return demo and/or verbalize understanding of the post op HEP related to regaining shoulder ROM. Baseline:  No knowledge 09/01/2022 Achieved at eval  3 Pt will be able to verbalize understanding of the importance of attending the post op After Breast CA Class for further lymphedema risk reduction education and therapeutic exercise.  Baseline:  No knowledge 09/01/2022 Achieved at eval  4 Pt will demo she has regained full shoulder ROM and function post operatively compared to baselines.  Baseline: See objective measurements taken today.  10/14/22 New     PLAN: PT FREQUENCY/DURATION: EVAL and 1 follow up appointment.   PLAN FOR NEXT SESSION: will reassess 3-4 weeks post op to determine needs. Pt may benefit from neuro rehab to improve indep with gait   Patient will follow up at outpatient cancer rehab 3-4 weeks following surgery.  If the patient requires physical therapy at that time, a specific plan will be dictated and sent to the referring physician for approval. The patient was educated today on appropriate basic range of motion exercises to begin post operatively and the importance of attending the After Breast Cancer class following surgery.  Patient was educated today on lymphedema risk reduction practices as it pertains to recommendations that will benefit the patient immediately following surgery.  She verbalized good understanding.    Physical Therapy Information for After Breast Cancer Surgery/Treatment:  Lymphedema is a swelling condition that you may be at risk for in your arm if you have lymph nodes removed  from the armpit area.  After a sentinel node biopsy, the risk is approximately 5-9% and is higher after an axillary node dissection.  There is treatment available for this condition and it is not life-threatening.  Contact your physician or physical therapist with concerns. You may begin the 4 shoulder/posture exercises (see additional sheet) when permitted by your physician (typically a week after surgery).  If you have drains, you may need to wait until those are removed before beginning range of motion exercises.  A general recommendation is to not lift your arms above shoulder height until drains are removed.  These exercises should be done to your tolerance and gently.  This is not a "no pain/no gain" type of recovery so listen to your body and stretch into the range of motion that you can tolerate, stopping if you have pain.  If you are having immediate reconstruction, ask your plastic surgeon about doing exercises as he or she may want you to wait. We encourage you to attend the free one time ABC (After Breast Cancer) class offered by [facility].  You will learn  information related to lymphedema risk, prevention and treatment and additional exercises to regain mobility following surgery.  You can call [phone] for more information.  This is offered the 1st and 3rd Monday of each month.  You only attend the class one time. While undergoing any medical procedure or treatment, try to avoid blood pressure being taken or needle sticks from occurring on the arm on the side of cancer.   This recommendation begins after surgery and continues for the rest of your life.  This may help reduce your risk of getting lymphedema (swelling in your arm). An excellent resource for those seeking information on lymphedema is the National Lymphedema Network's web site. It can be accessed at [place].org If you notice swelling in your hand, arm or breast at any time following surgery (even if  it is many years from now), please contact your doctor or physical therapist to discuss this.  Lymphedema can be treated at any time but it is easier for you if it is treated early on.  If you feel like your shoulder motion is not returning to normal in a reasonable amount of time, please contact your surgeon or physical therapist.  [facility] [phone]. [address], [place] [address]  ABC CLASS After Breast Cancer Class  After Breast Cancer Class is a specially designed exercise class to assist you in a safe recover after having breast cancer surgery.  In this class you will learn how to get back to full function whether your drains were just removed or if you had surgery a month ago.  This one-time class is held the 1st and 3rd Monday of every month from 11:00 a.m. until 12:00 noon virtually.  This class is FREE and space is limited. For more information or to register for the next available class, call [phone].  Class Goals  Understand specific stretches to improve the flexibility of you chest and shoulder. Learn ways to safely strengthen your upper body and improve your posture. Understand the warning signs of infection and why you may be at risk for an arm infection. Learn about Lymphedema and prevention.  ** You do not attend this class until after surgery.  Drains must be removed to participate  Patient was instructed today in a home exercise program today for post op shoulder range of motion. These included active assist shoulder flexion in sitting, scapular retraction, wall walking with shoulder abduction, and hands behind head external rotation.  She was encouraged to do these twice a day, holding 3 seconds and repeating 5 times when permitted by her physician.    [facility] [place], PT 09/02/2022, 4:09 PM

## 2022-09-02 ENCOUNTER — Ambulatory Visit: Payer: Medicare Other | Attending: General Surgery | Admitting: Physical Therapy

## 2022-09-02 ENCOUNTER — Encounter: Payer: Self-pay | Admitting: Physical Therapy

## 2022-09-02 ENCOUNTER — Other Ambulatory Visit: Payer: Self-pay

## 2022-09-02 DIAGNOSIS — R293 Abnormal posture: Secondary | ICD-10-CM | POA: Diagnosis not present

## 2022-09-02 DIAGNOSIS — M25611 Stiffness of right shoulder, not elsewhere classified: Secondary | ICD-10-CM | POA: Insufficient documentation

## 2022-09-02 DIAGNOSIS — D0581 Other specified type of carcinoma in situ of right breast: Secondary | ICD-10-CM | POA: Insufficient documentation

## 2022-09-10 ENCOUNTER — Other Ambulatory Visit: Payer: Self-pay

## 2022-09-10 ENCOUNTER — Encounter (HOSPITAL_BASED_OUTPATIENT_CLINIC_OR_DEPARTMENT_OTHER): Payer: Self-pay | Admitting: General Surgery

## 2022-09-14 ENCOUNTER — Encounter: Payer: Self-pay | Admitting: *Deleted

## 2022-09-15 MED ORDER — CHLORHEXIDINE GLUCONATE CLOTH 2 % EX PADS: 6.0000 | MEDICATED_PAD | Freq: Once | CUTANEOUS | Status: DC

## 2022-09-15 NOTE — Progress Notes (Signed)
       Patient Instructions  The night before surgery:  No food after midnight. ONLY clear liquids after midnight  The day of surgery (if you do NOT have diabetes):  Drink ONE (1) Pre-Surgery Clear Ensure as directed.   This drink was given to you during your hospital  pre-op appointment visit. The pre-op nurse will instruct you on the time to drink the  Pre-Surgery Ensure depending on your surgery time. Finish the drink at the designated time by the pre-op nurse.  Nothing else to drink after completing the  Pre-Surgery Clear Ensure.  The day of surgery (if you have diabetes): Drink ONE (1) Gatorade 2 (G2) as directed. This drink was given to you during your hospital  pre-op appointment visit.  The pre-op nurse will instruct you on the time to drink the   Gatorade 2 (G2) depending on your surgery time. Color of the Gatorade may vary. Red is not allowed. Nothing else to drink after completing the  Gatorade 2 (G2).         If you have questions, please contact your surgeon's office. Surgical soap given to patient with instructions and patient verbalized understanding.  

## 2022-09-20 ENCOUNTER — Other Ambulatory Visit: Payer: Self-pay

## 2022-09-20 ENCOUNTER — Encounter (HOSPITAL_BASED_OUTPATIENT_CLINIC_OR_DEPARTMENT_OTHER)
Admission: RE | Disposition: A | Discharge status: Home / Self Care | Payer: Self-pay | Source: Ambulatory Visit | Attending: General Surgery

## 2022-09-20 ENCOUNTER — Ambulatory Visit (HOSPITAL_BASED_OUTPATIENT_CLINIC_OR_DEPARTMENT_OTHER): Payer: Medicare Other | Admitting: Anesthesiology

## 2022-09-20 ENCOUNTER — Ambulatory Visit (HOSPITAL_BASED_OUTPATIENT_CLINIC_OR_DEPARTMENT_OTHER)
Admission: RE | Admit: 2022-09-20 | Discharge: 2022-09-21 | Disposition: A | Discharge status: Home / Self Care | Payer: Medicare Other | Source: Ambulatory Visit | Attending: General Surgery | Admitting: General Surgery

## 2022-09-20 ENCOUNTER — Encounter (HOSPITAL_BASED_OUTPATIENT_CLINIC_OR_DEPARTMENT_OTHER): Payer: Self-pay | Admitting: General Surgery

## 2022-09-20 DIAGNOSIS — C50911 Malignant neoplasm of unspecified site of right female breast: Secondary | ICD-10-CM | POA: Diagnosis not present

## 2022-09-20 DIAGNOSIS — F17210 Nicotine dependence, cigarettes, uncomplicated: Secondary | ICD-10-CM | POA: Diagnosis not present

## 2022-09-20 DIAGNOSIS — D0511 Intraductal carcinoma in situ of right breast: Secondary | ICD-10-CM | POA: Diagnosis not present

## 2022-09-20 DIAGNOSIS — K219 Gastro-esophageal reflux disease without esophagitis: Secondary | ICD-10-CM | POA: Diagnosis not present

## 2022-09-20 DIAGNOSIS — J449 Chronic obstructive pulmonary disease, unspecified: Secondary | ICD-10-CM

## 2022-09-20 DIAGNOSIS — F419 Anxiety disorder, unspecified: Secondary | ICD-10-CM | POA: Insufficient documentation

## 2022-09-20 DIAGNOSIS — Z170 Estrogen receptor positive status [ER+]: Secondary | ICD-10-CM | POA: Diagnosis not present

## 2022-09-20 DIAGNOSIS — E785 Hyperlipidemia, unspecified: Secondary | ICD-10-CM | POA: Diagnosis not present

## 2022-09-20 DIAGNOSIS — Z01818 Encounter for other preprocedural examination: Secondary | ICD-10-CM

## 2022-09-20 DIAGNOSIS — Z79899 Other long term (current) drug therapy: Secondary | ICD-10-CM | POA: Diagnosis not present

## 2022-09-20 DIAGNOSIS — G8918 Other acute postprocedural pain: Secondary | ICD-10-CM | POA: Diagnosis not present

## 2022-09-20 HISTORY — DX: Chronic obstructive pulmonary disease, unspecified: J44.9

## 2022-09-20 HISTORY — PX: MASTECTOMY W/ SENTINEL NODE BIOPSY: SHX2001

## 2022-09-20 SURGERY — MASTECTOMY WITH SENTINEL LYMPH NODE BIOPSY
Anesthesia: General | Site: Breast | Laterality: Right

## 2022-09-20 MED ORDER — MIDAZOLAM HCL 2 MG/2ML IJ SOLN
0.5000 mg | Freq: Once | INTRAMUSCULAR | Status: AC
  Administered 2022-09-20: 0.5 mg via INTRAVENOUS

## 2022-09-20 MED ORDER — BUPROPION HCL ER (XL) 300 MG PO TB24
300.0000 mg | ORAL_TABLET | Freq: Every day | ORAL | Status: DC
  Filled 2022-09-20: qty 1

## 2022-09-20 MED ORDER — GABAPENTIN 100 MG PO CAPS
100.0000 mg | ORAL_CAPSULE | ORAL | Status: AC
  Administered 2022-09-20: 100 mg via ORAL

## 2022-09-20 MED ORDER — PHENYLEPHRINE HCL (PRESSORS) 10 MG/ML IV SOLN
INTRAVENOUS | Status: DC | PRN
  Administered 2022-09-20: 80 ug via INTRAVENOUS
  Administered 2022-09-20: 40 ug via INTRAVENOUS
  Administered 2022-09-20: 80 ug via INTRAVENOUS
  Administered 2022-09-20: 40 ug via INTRAVENOUS
  Administered 2022-09-20: 160 ug via INTRAVENOUS

## 2022-09-20 MED ORDER — LACTATED RINGERS IV SOLN: INTRAVENOUS | Status: DC

## 2022-09-20 MED ORDER — PHENYLEPHRINE 80 MCG/ML (10ML) SYRINGE FOR IV PUSH (FOR BLOOD PRESSURE SUPPORT)
PREFILLED_SYRINGE | INTRAVENOUS | Status: AC
  Filled 2022-09-20: qty 20

## 2022-09-20 MED ORDER — ENOXAPARIN SODIUM 30 MG/0.3ML IJ SOSY
30.0000 mg | PREFILLED_SYRINGE | INTRAMUSCULAR | Status: DC
  Filled 2022-09-20: qty 0.3

## 2022-09-20 MED ORDER — PANTOPRAZOLE SODIUM 40 MG IV SOLR
40.0000 mg | Freq: Every day | INTRAVENOUS | Status: DC
  Administered 2022-09-20: 40 mg via INTRAVENOUS
  Filled 2022-09-20: qty 10

## 2022-09-20 MED ORDER — BUSPIRONE HCL 15 MG PO TABS
15.0000 mg | ORAL_TABLET | Freq: Two times a day (BID) | ORAL | Status: DC
  Filled 2022-09-20: qty 1

## 2022-09-20 MED ORDER — FENTANYL CITRATE (PF) 100 MCG/2ML IJ SOLN
50.0000 ug | Freq: Once | INTRAMUSCULAR | Status: AC
  Administered 2022-09-20: 50 ug via INTRAVENOUS

## 2022-09-20 MED ORDER — OXYCODONE HCL 5 MG PO TABS: 5.0000 mg | ORAL_TABLET | Freq: Once | ORAL | Status: DC | PRN

## 2022-09-20 MED ORDER — FENTANYL CITRATE (PF) 100 MCG/2ML IJ SOLN: 25.0000 ug | INTRAMUSCULAR | Status: DC | PRN

## 2022-09-20 MED ORDER — CEFAZOLIN SODIUM-DEXTROSE 2-4 GM/100ML-% IV SOLN
INTRAVENOUS | Status: AC
  Filled 2022-09-20: qty 100

## 2022-09-20 MED ORDER — ACETAMINOPHEN 500 MG PO TABS
ORAL_TABLET | ORAL | Status: AC
  Filled 2022-09-20: qty 2

## 2022-09-20 MED ORDER — EPHEDRINE 5 MG/ML INJ
INTRAVENOUS | Status: AC
  Filled 2022-09-20: qty 15

## 2022-09-20 MED ORDER — PROPOFOL 10 MG/ML IV BOLUS
INTRAVENOUS | Status: DC | PRN
  Administered 2022-09-20: 150 mg via INTRAVENOUS

## 2022-09-20 MED ORDER — ONDANSETRON HCL 4 MG/2ML IJ SOLN: 4.0000 mg | Freq: Four times a day (QID) | INTRAMUSCULAR | Status: DC | PRN

## 2022-09-20 MED ORDER — METHOCARBAMOL 500 MG PO TABS: 500.0000 mg | ORAL_TABLET | Freq: Four times a day (QID) | ORAL | Status: DC | PRN

## 2022-09-20 MED ORDER — SODIUM CHLORIDE 0.9 % IV SOLN: INTRAVENOUS | Status: DC

## 2022-09-20 MED ORDER — CEFAZOLIN SODIUM-DEXTROSE 2-4 GM/100ML-% IV SOLN
2.0000 g | INTRAVENOUS | Status: AC
  Administered 2022-09-20: 2 g via INTRAVENOUS

## 2022-09-20 MED ORDER — PROPOFOL 500 MG/50ML IV EMUL
INTRAVENOUS | Status: DC | PRN
  Administered 2022-09-20: 25 ug/kg/min via INTRAVENOUS

## 2022-09-20 MED ORDER — ONDANSETRON 4 MG PO TBDP: 4.0000 mg | ORAL_TABLET | Freq: Four times a day (QID) | ORAL | Status: DC | PRN

## 2022-09-20 MED ORDER — OXYCODONE HCL 5 MG PO TABS
5.0000 mg | ORAL_TABLET | ORAL | Status: DC | PRN
  Administered 2022-09-20 – 2022-09-21 (×3): 5 mg via ORAL
  Filled 2022-09-20 (×3): qty 1

## 2022-09-20 MED ORDER — ONDANSETRON HCL 4 MG/2ML IJ SOLN
INTRAMUSCULAR | Status: DC | PRN
  Administered 2022-09-20: 4 mg via INTRAVENOUS

## 2022-09-20 MED ORDER — DEXAMETHASONE SODIUM PHOSPHATE 4 MG/ML IJ SOLN
INTRAMUSCULAR | Status: DC | PRN
  Administered 2022-09-20: 4 mg via INTRAVENOUS

## 2022-09-20 MED ORDER — MORPHINE SULFATE (PF) 4 MG/ML IV SOLN: 1.0000 mg | INTRAVENOUS | Status: DC | PRN

## 2022-09-20 MED ORDER — AMISULPRIDE (ANTIEMETIC) 5 MG/2ML IV SOLN: 10.0000 mg | Freq: Once | INTRAVENOUS | Status: DC | PRN

## 2022-09-20 MED ORDER — CLONIDINE HCL (ANALGESIA) 100 MCG/ML EP SOLN
EPIDURAL | Status: DC | PRN
  Administered 2022-09-20: 50 ug

## 2022-09-20 MED ORDER — FENTANYL CITRATE (PF) 100 MCG/2ML IJ SOLN
INTRAMUSCULAR | Status: AC
  Filled 2022-09-20: qty 2

## 2022-09-20 MED ORDER — MAGTRACE LYMPHATIC TRACER
INTRAMUSCULAR | Status: DC | PRN
  Administered 2022-09-20: 2 mL via INTRAMUSCULAR

## 2022-09-20 MED ORDER — OXYCODONE HCL 5 MG/5ML PO SOLN: 5.0000 mg | Freq: Once | ORAL | Status: DC | PRN

## 2022-09-20 MED ORDER — FENTANYL CITRATE (PF) 100 MCG/2ML IJ SOLN
INTRAMUSCULAR | Status: DC | PRN
  Administered 2022-09-20: 25 ug via INTRAVENOUS
  Administered 2022-09-20: 50 ug via INTRAVENOUS

## 2022-09-20 MED ORDER — ACETAMINOPHEN 500 MG PO TABS
1000.0000 mg | ORAL_TABLET | ORAL | Status: AC
  Administered 2022-09-20: 1000 mg via ORAL

## 2022-09-20 MED ORDER — GABAPENTIN 100 MG PO CAPS
ORAL_CAPSULE | ORAL | Status: AC
  Filled 2022-09-20: qty 1

## 2022-09-20 MED ORDER — MIDAZOLAM HCL 2 MG/2ML IJ SOLN
INTRAMUSCULAR | Status: AC
  Filled 2022-09-20: qty 2

## 2022-09-20 MED ORDER — EPHEDRINE SULFATE (PRESSORS) 50 MG/ML IJ SOLN
INTRAMUSCULAR | Status: DC | PRN
  Administered 2022-09-20: 10 mg via INTRAVENOUS
  Administered 2022-09-20: 5 mg via INTRAVENOUS
  Administered 2022-09-20 (×2): 10 mg via INTRAVENOUS
  Administered 2022-09-20: 5 mg via INTRAVENOUS

## 2022-09-20 MED ORDER — ROPIVACAINE HCL 5 MG/ML IJ SOLN
INTRAMUSCULAR | Status: DC | PRN
  Administered 2022-09-20: 30 mL via PERINEURAL

## 2022-09-20 SURGICAL SUPPLY — 50 items
ADH SKN CLS APL DERMABOND .7 (GAUZE/BANDAGES/DRESSINGS) ×1
APL PRP STRL LF DISP 70% ISPRP (MISCELLANEOUS) ×1
APPLIER CLIP 9.375 MED OPEN (MISCELLANEOUS) ×3
APR CLP MED 9.3 20 MLT OPN (MISCELLANEOUS) ×3
BINDER BREAST XLRG (GAUZE/BANDAGES/DRESSINGS) IMPLANT
BINDER BREAST XXLRG (GAUZE/BANDAGES/DRESSINGS) IMPLANT
BIOPATCH RED 1 DISK 7.0 (GAUZE/BANDAGES/DRESSINGS) ×2 IMPLANT
BLADE SURG 10 STRL SS (BLADE) ×2 IMPLANT
BLADE SURG 15 STRL LF DISP TIS (BLADE) ×2 IMPLANT
BLADE SURG 15 STRL SS (BLADE) ×1
CANISTER SUCT 1200ML W/VALVE (MISCELLANEOUS) ×2 IMPLANT
CHLORAPREP W/TINT 26 (MISCELLANEOUS) ×2 IMPLANT
CLIP APPLIE 9.375 MED OPEN (MISCELLANEOUS) ×2 IMPLANT
COVER BACK TABLE 60X90IN (DRAPES) ×2 IMPLANT
COVER MAYO STAND STRL (DRAPES) ×2 IMPLANT
COVER PROBE W GEL 5X96 (DRAPES) ×2 IMPLANT
DERMABOND ADVANCED .7 DNX12 (GAUZE/BANDAGES/DRESSINGS) ×2 IMPLANT
DEVICE DSSCT PLSMBLD 3.0S LGHT (MISCELLANEOUS) ×2 IMPLANT
DRAIN CHANNEL 19F RND (DRAIN) ×2 IMPLANT
DRAPE LAPAROSCOPIC ABDOMINAL (DRAPES) ×2 IMPLANT
DRAPE UTILITY XL STRL (DRAPES) ×2 IMPLANT
DRSG TEGADERM 2-3/8X2-3/4 SM (GAUZE/BANDAGES/DRESSINGS) ×2 IMPLANT
ELECT REM PT RETURN 9FT ADLT (ELECTROSURGICAL) ×1
ELECTRODE REM PT RTRN 9FT ADLT (ELECTROSURGICAL) ×2 IMPLANT
EVACUATOR SILICONE 100CC (DRAIN) ×2 IMPLANT
GAUZE PAD ABD 8X10 STRL (GAUZE/BANDAGES/DRESSINGS) ×2 IMPLANT
GAUZE SPONGE 4X4 12PLY STRL LF (GAUZE/BANDAGES/DRESSINGS) ×2 IMPLANT
GLOVE BIO SURGEON STRL SZ 6.5 (GLOVE) IMPLANT
GLOVE BIO SURGEON STRL SZ7.5 (GLOVE) ×2 IMPLANT
GLOVE BIO SURGEON STRL SZ8 (GLOVE) IMPLANT
GLOVE BIOGEL PI IND STRL 8 (GLOVE) IMPLANT
GOWN STRL REUS W/ TWL LRG LVL3 (GOWN DISPOSABLE) ×4 IMPLANT
GOWN STRL REUS W/ TWL XL LVL3 (GOWN DISPOSABLE) IMPLANT
GOWN STRL REUS W/TWL LRG LVL3 (GOWN DISPOSABLE) ×2
GOWN STRL REUS W/TWL XL LVL3 (GOWN DISPOSABLE) ×1
MAT PREVALON FULL STRYKER (MISCELLANEOUS) IMPLANT
NS IRRIG 1000ML POUR BTL (IV SOLUTION) ×2 IMPLANT
PACK BASIN DAY SURGERY FS (CUSTOM PROCEDURE TRAY) ×2 IMPLANT
PIN SAFETY STERILE (MISCELLANEOUS) ×2 IMPLANT
PLASMABLADE 3.0S W/LIGHT (MISCELLANEOUS) ×1
SLEEVE SCD COMPRESS KNEE MED (STOCKING) ×2 IMPLANT
SPONGE T-LAP 18X18 ~~LOC~~+RFID (SPONGE) ×2 IMPLANT
SUT ETHILON 2 0 FS 18 (SUTURE) ×2 IMPLANT
SUT MNCRL AB 4-0 PS2 18 (SUTURE) ×2 IMPLANT
SUT SILK 2 0 SH (SUTURE) IMPLANT
SUT VICRYL 3-0 CR8 SH (SUTURE) ×2 IMPLANT
TOWEL GREEN STERILE FF (TOWEL DISPOSABLE) ×2 IMPLANT
TRACER MAGTRACE VIAL (MISCELLANEOUS) IMPLANT
TUBE CONNECTING 20X1/4 (TUBING) ×2 IMPLANT
YANKAUER SUCT BULB TIP NO VENT (SUCTIONS) ×2 IMPLANT

## 2022-09-20 NOTE — Transfer of Care (Signed)
Immediate Anesthesia Transfer of Care Note  Patient: Emily Horton  Procedure(s) Performed: RIGHT MASTECTOMY WITH SENTINEL LYMPH NODE BIOPSY (Right: Breast)  Patient Location: PACU  Anesthesia Type:GA combined with regional for post-op pain  Level of Consciousness: drowsy and patient cooperative  Airway & Oxygen Therapy: Patient Spontanous Breathing and Patient connected to face mask oxygen  Post-op Assessment: Report given to RN and Post -op Vital signs reviewed and stable  Post vital signs: Reviewed and stable  Last Vitals:  Vitals Value Taken Time  BP    Temp    Pulse    Resp    SpO2      Last Pain:  Vitals:   09/20/22 0929  TempSrc: Oral  PainSc: 0-No pain         Complications: No notable events documented.

## 2022-09-20 NOTE — H&P (Signed)
PROVIDER: Landry Corporal, MD  MRN: [id number] DOB: 1946/01/17 Subjective   Chief Complaint: Follow-up   History of Present Illness: Emily Horton is a 76 y.o. female who is seen today for right breast cancer. 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 She underwent an MRI which showed the area to be quite a bit larger than what we originally saw. The enhancement also involve the nipple. For this reason my recommendation at this point would be for right mastectomy.    Review of Systems: A complete review of systems was obtained from the patient. I have reviewed this information and discussed as appropriate with the patient. See HPI as well for other ROS.  ROS   Medical History: Past Medical History:  Diagnosis Date  Anxiety  Arthritis  GERD (gastroesophageal reflux disease)   Patient Active Problem List  Diagnosis  Angio-edema  Depression  Diverticulosis of colon without hemorrhage  Dyslipidemia  Environmental and seasonal allergies  Esophageal reflux  IBS (irritable bowel syndrome)  Ductal carcinoma in situ (DCIS) of right breast   Past Surgical History:  Procedure Laterality Date  TOTAL SHOULDER REPLACEMENT 01/2020    Allergies  Allergen Reactions  Other Hives  Dust and mold. Possible swelling to face and throat. Cause not confirmed.  Egg Itching  Past allergy  Sertraline Hcl Headache  Headaches   Current Outpatient Medications on File Prior to Visit  Medication Sig Dispense Refill  betamethasone dipropionate (DIPROSONE) 0.05 % ointment Apply 2x daily x 4 weeks then stop 45 g 0  buPROPion (WELLBUTRIN XL) 300 MG XL tablet TAKE 1 TABLET BY MOUTH EVERY MORNING  cetirizine (ZYRTEC) 10 MG tablet Take by mouth  omeprazole (PRILOSEC) 40 MG DR capsule Take by mouth  triamcinolone 0.1 % cream APPLY EXTERNALLY TO THE AFFECTED AREA TWICE DAILY FOR 14  DAYS   No current facility-administered medications on file prior to visit.   Family History  Problem Relation Age of Onset  Diabetes Mother  Stroke Father    Social History   Tobacco Use  Smoking Status Every Day  Packs/day: 0.50  Years: 45.00  Pack years: 22.50  Types: Cigarettes  Smokeless Tobacco Never    Social History   Socioeconomic History  Marital status: Widowed  Tobacco Use  Smoking status: Every Day  Packs/day: 0.50  Years: 45.00  Pack years: 22.50  Types: Cigarettes  Smokeless tobacco: Never  Substance and Sexual Activity  Alcohol use: Yes  Comment: occasionally  Drug use: Never   Objective:   There were no vitals filed for this visit.  There is no height or weight on file to calculate BMI.  Physical Exam Vitals reviewed.  Constitutional:  General: She is not in acute distress. Appearance: Normal appearance.  HENT:  Head: Normocephalic and atraumatic.  Right Ear: External ear normal.  Left Ear: External ear normal.  Nose: Nose normal.  Mouth/Throat:  Mouth: Mucous membranes are moist.  Pharynx: Oropharynx is clear.  Eyes:  General: No scleral icterus. Extraocular Movements: Extraocular movements intact.  Conjunctiva/sclera: Conjunctivae normal.  Pupils: Pupils are equal, round, and reactive to light.  Cardiovascular:  Rate and Rhythm: Normal rate and regular rhythm.  Pulses: Normal pulses.  Heart sounds: Normal heart sounds.  Pulmonary:  Effort: Pulmonary effort is normal. No respiratory distress.  Breath sounds: Normal breath sounds.  Abdominal:  General: Bowel sounds are normal.  Palpations:  Abdomen is soft.  Tenderness: There is no abdominal tenderness.  Musculoskeletal:  General: No swelling, tenderness or deformity. Normal range of motion.  Cervical back: Normal range of motion and neck supple.  Skin: General: Skin is warm and dry.  Coloration: Skin is not jaundiced.  Neurological:  General: No focal deficit present.   Mental Status: She is alert and oriented to person, place, and time.  Psychiatric:  Mood and Affect: Mood normal.  Behavior: Behavior normal.    Breast: There is no palpable mass in either breast. There is no palpable axillary, supraclavicular, or cervical lymphadenopathy.  Labs, Imaging and Diagnostic Testing:  Assessment and Plan:   Diagnoses and all orders for this visit:  Ductal carcinoma in situ (DCIS) of right breast - [facility] Case Posting Request; Future    The patient appears to have a large area of ductal carcinoma in situ involving the inner and central right breast including the nipple. Because of the large area involved my recommendation would be for mastectomy. She is in agreement with this. I have discussed with her in detail the risks and benefits of the operation as well as some of the technical aspects and she understands and wishes to proceed. She has met with medical and radiation oncology to discuss adjuvant therapy. We will proceed with surgical planning.

## 2022-09-20 NOTE — Progress Notes (Signed)
Assisted Dr. Rob Fitzgerald with right, pectoralis, ultrasound guided block. Side rails up, monitors on throughout procedure. See vital signs in flow sheet. Tolerated Procedure well. 

## 2022-09-20 NOTE — Op Note (Signed)
09/20/2022  11:59 AM  PATIENT:  Emily Horton  76 y.o. female  PRE-OPERATIVE DIAGNOSIS:  RIGHT BREAST DCIS  POST-OPERATIVE DIAGNOSIS:  RIGHT BREAST DCIS  PROCEDURE:  Procedure(s): RIGHT MASTECTOMY WITH SENTINEL LYMPH NODE BIOPSY (Right)  SURGEON:  Surgeon(s) and Role:    * Jovita Kussmaul, MD - Primary  PHYSICIAN ASSISTANT:   ASSISTANTS: none   ANESTHESIA:   general  EBL:  20 mL   BLOOD ADMINISTERED:none  DRAINS: (1) Blake drain(s) in the prepectoral space    LOCAL MEDICATIONS USED:  NONE  SPECIMEN:  Source of Specimen:  right mastectomy and sentinel node  DISPOSITION OF SPECIMEN:  PATHOLOGY  COUNTS:  YES  TOURNIQUET:  * No tourniquets in log *  DICTATION: .Dragon Dictation  After informed consent was obtained the patient was brought to the operating room and placed in the supine position on the operating table.  After adequate induction of general anesthesia the patient's right chest, breast, and axillary area were prepped with ChloraPrep, allowed to dry, and draped in usual sterile manner.  An appropriate timeout was performed.  At this point, 2 cc of iron oxide were injected into the subareolar plexus of the right breast and the breast was massaged for about 5 minutes.  An elliptical incision was then made around the nipple and areola complex in order to minimize the excess skin.  The incision was carried through the skin and subcutaneous tissue sharply with the PlasmaBlade.  Breast hooks were used to elevate the skin flaps anteriorly towards the ceiling.  Thin skin flaps were then created by dissecting between the breast tissue and the subcutaneous fat and skin.  This dissection was carried circumferentially all the way to the chest wall.  Next the breast was removed from the pectoralis muscle with the pectoralis fascia.  Once this dissection was complete then the entire right breast was removed from the patient.  The breast was marked with a stitch on the lateral skin and  sent to pathology for further evaluation.  The Sentimag was used to identify a signal in the right axilla.  This lymph node was excised sharply with the PlasmaBlade and the surrounding small vessels and lymphatics were controlled with clips.  This was sent as sentinel node #1.  No other hot or palpable nodes were identified in the right axilla.  Several small vessels and intercostal brachial nerves were also identified laterally during the mastectomy dissection and controlled with clips.  Hemostasis was achieved using the PlasmaBlade.  The wound was irrigated with copious amounts of saline.  The skin flaps appeared healthy.  Next a small stab incision was made near the anterior axillary line inferior to the operative bed.  A tonsil clamp was placed through this opening and used to bring a 19 [place] round Blake drain into the operative bed.  The drain was anchored to the skin with a 3-0 nylon stitch.  The drain was curled along the chest wall.  Next the superior and inferior skin flaps were grossly reapproximated with interrupted 3-0 Vicryl stitches.  The skin was then closed with a running 4-0 Monocryl subcuticular stitch.  Dermabond dressings and sterile drain dressings were applied.  The patient tolerated the procedure well.  At the end of the case all needle sponge and instrument counts were correct.  The patient was then awakened and taken to recovery in stable condition.  PLAN OF CARE: Admit for overnight observation  PATIENT DISPOSITION:  PACU - hemodynamically stable.   Delay  start of Pharmacological VTE agent (>24hrs) due to surgical blood loss or risk of bleeding: no

## 2022-09-20 NOTE — Anesthesia Postprocedure Evaluation (Signed)
Anesthesia Post Note  Patient: Emily Horton  Procedure(s) Performed: RIGHT MASTECTOMY WITH SENTINEL LYMPH NODE BIOPSY (Right: Breast)     Patient location during evaluation: PACU Anesthesia Type: General Level of consciousness: awake and alert Pain management: pain level controlled Vital Signs Assessment: post-procedure vital signs reviewed and stable Respiratory status: spontaneous breathing, nonlabored ventilation, respiratory function stable and patient connected to nasal cannula oxygen Cardiovascular status: blood pressure returned to baseline and stable Postop Assessment: no apparent nausea or vomiting Anesthetic complications: no   No notable events documented.  Last Vitals:  Vitals:   09/20/22 1300 09/20/22 1315  BP:  (!) 150/86  Pulse: 98 93  Resp: (!) 22 (!) 9  Temp:    SpO2: 94% 96%    Last Pain:  Vitals:   09/20/22 1315  TempSrc:   PainSc: 0-No pain                 Tiajuana Amass

## 2022-09-20 NOTE — Discharge Instructions (Signed)

## 2022-09-20 NOTE — Anesthesia Procedure Notes (Signed)
Procedure Name: LMA Insertion Date/Time: 09/20/2022 10:34 AM  Performed by: Lesly Pontarelli, Ernesta Amble, CRNAPre-anesthesia Checklist: Patient identified, Emergency Drugs available, Suction available and Patient being monitored Patient Re-evaluated:Patient Re-evaluated prior to induction Oxygen Delivery Method: Circle system utilized Preoxygenation: Pre-oxygenation with 100% oxygen Induction Type: IV induction Ventilation: Mask ventilation without difficulty LMA: LMA inserted LMA Size: 4.0 Number of attempts: 1 Airway Equipment and Method: Bite block Placement Confirmation: positive ETCO2 Tube secured with: Tape Dental Injury: Teeth and Oropharynx as per pre-operative assessment

## 2022-09-20 NOTE — Anesthesia Procedure Notes (Signed)
Anesthesia Regional Block: Pectoralis block   Pre-Anesthetic Checklist: , timeout performed,  Correct Patient, Correct Site, Correct Laterality,  Correct Procedure, Correct Position, site marked,  Risks and benefits discussed,  Surgical consent,  Pre-op evaluation,  At surgeon's request and post-op pain management  Laterality: Right  Prep: chloraprep       Needles:  Injection technique: Single-shot  Needle Type: Echogenic Needle     Needle Length: 9cm  Needle Gauge: 21     Additional Needles:   Procedures:,,,, ultrasound used (permanent image in chart),,    Narrative:  Start time: 09/20/2022 9:45 AM End time: 09/20/2022 9:52 AM Injection made incrementally with aspirations every 5 mL.  Performed by: Personally  Anesthesiologist: Suzette Battiest, MD

## 2022-09-20 NOTE — Anesthesia Preprocedure Evaluation (Signed)
Anesthesia Evaluation  Patient identified by MRN, date of birth, ID band Patient awake    Reviewed: Allergy & Precautions, NPO status , Patient's Chart, lab work & pertinent test results  Airway Mallampati: II  TM Distance: >3 FB Neck ROM: Full    Dental   Pulmonary COPD, Current Smoker and Patient abstained from smoking.,    breath sounds clear to auscultation       Cardiovascular negative cardio ROS   Rhythm:Regular Rate:Normal     Neuro/Psych negative neurological ROS     GI/Hepatic Neg liver ROS, GERD  ,  Endo/Other  negative endocrine ROS  Renal/GU negative Renal ROS     Musculoskeletal   Abdominal   Peds  Hematology   Anesthesia Other Findings   Reproductive/Obstetrics                             Anesthesia Physical Anesthesia Plan  ASA: 3  Anesthesia Plan: General   Post-op Pain Management: Regional block*, Tylenol PO (pre-op)* and Celebrex PO (pre-op)*   Induction: Intravenous  PONV Risk Score and Plan: 2 and Dexamethasone, Ondansetron and Treatment may vary due to age or medical condition  Airway Management Planned: LMA  Additional Equipment: None  Intra-op Plan:   Post-operative Plan: Extubation in OR  Informed Consent: I have reviewed the patients History and Physical, chart, labs and discussed the procedure including the risks, benefits and alternatives for the proposed anesthesia with the patient or authorized representative who has indicated his/her understanding and acceptance.     Dental advisory given  Plan Discussed with: CRNA  Anesthesia Plan Comments:         Anesthesia Quick Evaluation

## 2022-09-20 NOTE — Interval H&P Note (Signed)
History and Physical Interval Note:  09/20/2022 10:21 AM  Emily Horton  has presented today for surgery, with the diagnosis of RIGHT BREAST DCIS.  The various methods of treatment have been discussed with the patient and family. After consideration of risks, benefits and other options for treatment, the patient has consented to  Procedure(s): RIGHT MASTECTOMY WITH SENTINEL LYMPH NODE BIOPSY (Right) as a surgical intervention.  The patient's history has been reviewed, patient examined, no change in status, stable for surgery.  I have reviewed the patient's chart and labs.  Questions were answered to the patient's satisfaction.     Autumn Messing III

## 2022-09-21 ENCOUNTER — Ambulatory Visit: Payer: Medicare Other | Admitting: Hematology and Oncology

## 2022-09-21 ENCOUNTER — Encounter (HOSPITAL_BASED_OUTPATIENT_CLINIC_OR_DEPARTMENT_OTHER): Payer: Self-pay | Admitting: General Surgery

## 2022-09-21 DIAGNOSIS — E785 Hyperlipidemia, unspecified: Secondary | ICD-10-CM | POA: Diagnosis not present

## 2022-09-21 DIAGNOSIS — D0511 Intraductal carcinoma in situ of right breast: Secondary | ICD-10-CM | POA: Diagnosis not present

## 2022-09-21 DIAGNOSIS — Z79899 Other long term (current) drug therapy: Secondary | ICD-10-CM | POA: Diagnosis not present

## 2022-09-21 DIAGNOSIS — K219 Gastro-esophageal reflux disease without esophagitis: Secondary | ICD-10-CM | POA: Diagnosis not present

## 2022-09-21 DIAGNOSIS — F17210 Nicotine dependence, cigarettes, uncomplicated: Secondary | ICD-10-CM | POA: Diagnosis not present

## 2022-09-21 DIAGNOSIS — J449 Chronic obstructive pulmonary disease, unspecified: Secondary | ICD-10-CM | POA: Diagnosis not present

## 2022-09-21 MED ORDER — METHOCARBAMOL 500 MG PO TABS: 500.0000 mg | ORAL_TABLET | Freq: Four times a day (QID) | ORAL | 1 refills | Status: DC | PRN

## 2022-09-21 MED ORDER — OXYCODONE HCL 5 MG PO TABS: 5.0000 mg | ORAL_TABLET | Freq: Four times a day (QID) | ORAL | 0 refills | Status: DC | PRN

## 2022-09-21 MED ORDER — ENOXAPARIN SODIUM 40 MG/0.4ML IJ SOSY
40.0000 mg | PREFILLED_SYRINGE | Freq: Once | INTRAMUSCULAR | Status: AC
  Administered 2022-09-21: 40 mg via SUBCUTANEOUS
  Filled 2022-09-21: qty 0.4

## 2022-09-21 NOTE — Progress Notes (Signed)
1 Day Post-Op   Subjective/Chief Complaint: No complaints other than some soreness   Objective: Vital signs in last 24 hours: Temp:  [97.8 F (36.6 C)-98.9 F (37.2 C)] 98.2 F (36.8 C) (10/17 0600) Pulse Rate:  [70-98] 87 (10/17 0700) Resp:  [8-22] 18 (10/17 0600) BP: (104-175)/(67-97) 129/72 (10/17 0600) SpO2:  [93 %-100 %] 99 % (10/17 0700) Weight:  [91.3 kg] 91.3 kg (10/16 0929)    Intake/Output from previous day: 10/16 0701 - 10/17 0700 In: 1100 [P.O.:300; I.V.:800] Out: 785 [Urine:650; Drains:115; Blood:20] Intake/Output this shift: No intake/output data recorded.  General appearance: alert and cooperative Resp: clear to auscultation bilaterally Chest wall: skin flaps look good Cardio: regular rate and rhythm GI: soft, non-tender; bowel sounds normal; no masses,  no organomegaly  Lab Results:  No results for input(s): "WBC", "HGB", "HCT", "PLT" in the last 72 hours. BMET No results for input(s): "NA", "K", "CL", "CO2", "GLUCOSE", "BUN", "CREATININE", "CALCIUM" in the last 72 hours. PT/INR No results for input(s): "LABPROT", "INR" in the last 72 hours. ABG No results for input(s): "PHART", "HCO3" in the last 72 hours.  Invalid input(s): "PCO2", "PO2"  Studies/Results: No results found.  Anti-infectives: Anti-infectives (From admission, onward)    Start     Dose/Rate Route Frequency Ordered Stop   09/20/22 0915  ceFAZolin (ANCEF) IVPB 2g/100 mL premix        2 g 200 mL/hr over 30 Minutes Intravenous On call to O.R. 09/20/22 0904 09/20/22 1043       Assessment/Plan: s/p Procedure(s): RIGHT MASTECTOMY WITH SENTINEL LYMPH NODE BIOPSY (Right) Advance diet Discharge  LOS: 0 days    Autumn Messing III 09/21/2022

## 2022-09-23 ENCOUNTER — Other Ambulatory Visit: Payer: Self-pay | Admitting: Family Medicine

## 2022-09-27 ENCOUNTER — Encounter: Payer: Self-pay | Admitting: *Deleted

## 2022-09-27 LAB — SURGICAL PATHOLOGY

## 2022-09-28 ENCOUNTER — Ambulatory Visit: Payer: Medicare Other | Admitting: Hematology and Oncology

## 2022-10-02 ENCOUNTER — Emergency Department (HOSPITAL_COMMUNITY): Payer: Medicare Other

## 2022-10-02 ENCOUNTER — Emergency Department (HOSPITAL_COMMUNITY)
Admission: EM | Admit: 2022-10-02 | Discharge: 2022-10-02 | Discharge status: Against Medical Advice | Payer: Medicare Other | Attending: Emergency Medicine | Admitting: Emergency Medicine

## 2022-10-02 ENCOUNTER — Encounter (HOSPITAL_COMMUNITY): Payer: Self-pay | Admitting: Emergency Medicine

## 2022-10-02 ENCOUNTER — Other Ambulatory Visit: Payer: Self-pay

## 2022-10-02 DIAGNOSIS — R231 Pallor: Secondary | ICD-10-CM | POA: Diagnosis not present

## 2022-10-02 DIAGNOSIS — R42 Dizziness and giddiness: Secondary | ICD-10-CM | POA: Diagnosis not present

## 2022-10-02 DIAGNOSIS — S8991XA Unspecified injury of right lower leg, initial encounter: Secondary | ICD-10-CM | POA: Diagnosis not present

## 2022-10-02 DIAGNOSIS — W06XXXA Fall from bed, initial encounter: Secondary | ICD-10-CM | POA: Insufficient documentation

## 2022-10-02 DIAGNOSIS — Z5321 Procedure and treatment not carried out due to patient leaving prior to being seen by health care provider: Secondary | ICD-10-CM | POA: Diagnosis not present

## 2022-10-02 DIAGNOSIS — M79671 Pain in right foot: Secondary | ICD-10-CM | POA: Diagnosis not present

## 2022-10-02 DIAGNOSIS — I10 Essential (primary) hypertension: Secondary | ICD-10-CM | POA: Diagnosis not present

## 2022-10-02 DIAGNOSIS — M25561 Pain in right knee: Secondary | ICD-10-CM | POA: Diagnosis not present

## 2022-10-02 DIAGNOSIS — M79604 Pain in right leg: Secondary | ICD-10-CM | POA: Diagnosis not present

## 2022-10-02 DIAGNOSIS — S80919A Unspecified superficial injury of unspecified knee, initial encounter: Secondary | ICD-10-CM | POA: Diagnosis not present

## 2022-10-02 DIAGNOSIS — M25562 Pain in left knee: Secondary | ICD-10-CM | POA: Diagnosis not present

## 2022-10-02 DIAGNOSIS — R531 Weakness: Secondary | ICD-10-CM | POA: Diagnosis not present

## 2022-10-02 LAB — COMPREHENSIVE METABOLIC PANEL
ALT: 11 U/L (ref 0–44)
AST: 13 U/L — ABNORMAL LOW (ref 15–41)
Albumin: 4 g/dL (ref 3.5–5.0)
Alkaline Phosphatase: 66 U/L (ref 38–126)
Anion gap: 12 (ref 5–15)
BUN: 18 mg/dL (ref 8–23)
CO2: 20 mmol/L — ABNORMAL LOW (ref 22–32)
Calcium: 10 mg/dL (ref 8.9–10.3)
Chloride: 103 mmol/L (ref 98–111)
Creatinine, Ser: 1.06 mg/dL — ABNORMAL HIGH (ref 0.44–1.00)
GFR, Estimated: 55 mL/min — ABNORMAL LOW (ref >60)
Glucose, Bld: 142 mg/dL — ABNORMAL HIGH (ref 70–99)
Potassium: 4.2 mmol/L (ref 3.5–5.1)
Sodium: 135 mmol/L (ref 135–145)
Total Bilirubin: 1 mg/dL (ref 0.3–1.2)
Total Protein: 6.5 g/dL (ref 6.5–8.1)

## 2022-10-02 LAB — I-STAT CHEM 8, ED
BUN: 20 mg/dL (ref 8–23)
Calcium, Ion: 1.25 mmol/L (ref 1.15–1.40)
Chloride: 104 mmol/L (ref 98–111)
Creatinine, Ser: 1 mg/dL (ref 0.44–1.00)
Glucose, Bld: 144 mg/dL — ABNORMAL HIGH (ref 70–99)
HCT: 45 % (ref 36.0–46.0)
Hemoglobin: 15.3 g/dL — ABNORMAL HIGH (ref 12.0–15.0)
Potassium: 4.2 mmol/L (ref 3.5–5.1)
Sodium: 137 mmol/L (ref 135–145)
TCO2: 22 mmol/L (ref 22–32)

## 2022-10-02 LAB — CBC WITH DIFFERENTIAL/PLATELET
Abs Immature Granulocytes: 0.14 10*3/uL — ABNORMAL HIGH (ref 0.00–0.07)
Basophils Absolute: 0.1 10*3/uL (ref 0.0–0.1)
Basophils Relative: 0 %
Eosinophils Absolute: 0 10*3/uL (ref 0.0–0.5)
Eosinophils Relative: 0 %
HCT: 42.8 % (ref 36.0–46.0)
Hemoglobin: 14.5 g/dL (ref 12.0–15.0)
Immature Granulocytes: 1 %
Lymphocytes Relative: 6 %
Lymphs Abs: 1.3 10*3/uL (ref 0.7–4.0)
MCH: 32.1 pg (ref 26.0–34.0)
MCHC: 33.9 g/dL (ref 30.0–36.0)
MCV: 94.7 fL (ref 80.0–100.0)
Monocytes Absolute: 1.3 10*3/uL — ABNORMAL HIGH (ref 0.1–1.0)
Monocytes Relative: 7 %
Neutro Abs: 17.6 10*3/uL — ABNORMAL HIGH (ref 1.7–7.7)
Neutrophils Relative %: 86 %
Platelets: 233 10*3/uL (ref 150–400)
RBC: 4.52 MIL/uL (ref 3.87–5.11)
RDW: 12.9 % (ref 11.5–15.5)
WBC: 20.4 10*3/uL — ABNORMAL HIGH (ref 4.0–10.5)
nRBC: 0 % (ref 0.0–0.2)

## 2022-10-02 NOTE — ED Notes (Signed)
Pt son took pt back home. Pt seen being wheeled out in w/c by son.

## 2022-10-02 NOTE — ED Provider Triage Note (Signed)
Emergency Medicine Provider Triage Evaluation Note  Emily Horton , a 76 y.o. female  was evaluated in triage.  Pt complains of multiple falls, feels light headed with standing. BP 88 systolic on arrival, improved to 114/76 while waiting. Called EMS yesterday but didn't come to the ER, states her right leg doesn't work, chronic problem. Fell yesterday, injured right knee, did not hit head, no LOC, not on blood thinners.  Drain with bloody drainage post mastectomy (1 week ago Monday).  Review of Systems  Positive: As above Negative: As above  Physical Exam  BP 114/76   Pulse (!) 105   Temp 98.6 F (37 C) (Oral)   Resp 18   SpO2 94%  Gen:   Awake, no distress   Resp:  Normal effort  MSK:   Moves extremities without difficulty  Other:    Medical Decision Making  Medically screening exam initiated at 11:26 AM.  Appropriate orders placed.  [name] was informed that the remainder of the evaluation will be completed by another provider, this initial triage assessment does not replace that evaluation, and the importance of remaining in the ED until their evaluation is complete.     Tacy Learn, PA-C 10/02/22 1129

## 2022-10-02 NOTE — ED Triage Notes (Signed)
Patient BIB [facility] from home after having a fall sliding halfway out of the bed but did not completely fall.  However she ell last night EMS evaluated and refused transport. Patient stating she fell because she feels generally weak since Monday had a mastectomy which she had done here to the R side, per EMS patient felt warm to the touch. Patient have L knee pain as well.   VS: HR 112 BP-130/90 RR 19  98% on RA  CBG 148

## 2022-10-02 NOTE — ED Notes (Signed)
Pt IV taken out, before leaving.

## 2022-10-04 ENCOUNTER — Telehealth: Payer: Self-pay | Admitting: Family Medicine

## 2022-10-04 NOTE — Telephone Encounter (Signed)
Pt call and stated she need something call in for a uti and she just can't come n

## 2022-10-05 ENCOUNTER — Ambulatory Visit: Payer: Medicare Other | Admitting: Hematology and Oncology

## 2022-10-05 ENCOUNTER — Encounter: Payer: Self-pay | Admitting: Family Medicine

## 2022-10-05 ENCOUNTER — Encounter: Payer: Self-pay | Admitting: *Deleted

## 2022-10-05 ENCOUNTER — Telehealth (INDEPENDENT_AMBULATORY_CARE_PROVIDER_SITE_OTHER): Payer: Medicare Other | Admitting: Family Medicine

## 2022-10-05 DIAGNOSIS — N390 Urinary tract infection, site not specified: Secondary | ICD-10-CM

## 2022-10-05 DIAGNOSIS — C50811 Malignant neoplasm of overlapping sites of right female breast: Secondary | ICD-10-CM | POA: Insufficient documentation

## 2022-10-05 DIAGNOSIS — F411 Generalized anxiety disorder: Secondary | ICD-10-CM | POA: Diagnosis not present

## 2022-10-05 MED ORDER — CIPROFLOXACIN HCL 500 MG PO TABS: 500.0000 mg | ORAL_TABLET | Freq: Two times a day (BID) | ORAL | 0 refills | Status: DC

## 2022-10-05 MED ORDER — CLONAZEPAM 0.5 MG PO TABS: 0.5000 mg | ORAL_TABLET | Freq: Three times a day (TID) | ORAL | 2 refills | Status: DC | PRN

## 2022-10-05 NOTE — Progress Notes (Signed)
   Subjective:    Patient ID: Emily Horton, female    DOB: [birth year], 76 y.o.   MRN: [id number]  HPI Virtual Visit via Telephone Note  I connected with the patient on 10/05/22 at  2:30 PM EDT by telephone and verified that I am speaking with the correct person using two identifiers.   I discussed the limitations, risks, security and privacy concerns of performing an evaluation and management service by telephone and the availability of in person appointments. I also discussed with the patient that there may be a patient responsible charge related to this service. The patient expressed understanding and agreed to proceed.  Location patient: home Location provider: work or home office Participants present for the call: patient, provider Patient did not have a visit in the prior 7 days to address this/these issue(s).   History of Present Illness: Here for 2 issues. First she thinks she has a UTI. For 2 days she has had urinary burning and urgency. No fever or nausea. Also her anxiety has been poorly controlled lately. She still takes Bupropion and Buspirone daily, but she often feel so anxious that she becomes tearful. Her sleep at night is sporadic.    Observations/Objective: Patient sounds cheerful and well on the phone. I do not appreciate any SOB. Speech and thought processing are grossly intact. Patient reported vitals:  Assessment and Plan: She likely has a UTI, so we will treat this with 7 days of Cipro. For the anxiety, she will stay on the current regimen but we will add Clonazepam 0.5 mg to take as needed. Follow up in 3-4 weeks.  Alysia Penna, MD   Follow Up Instructions:     [phone] 5-10 [phone] 11-20 [id number] 21-30 I did not refer this patient for an OV in the next 24 hours for this/these issue(s).  I discussed the assessment and treatment plan with the patient. The patient was provided an opportunity to ask questions and all were answered. The patient agreed with the plan and  demonstrated an understanding of the instructions.   The patient was advised to call back or seek an in-person evaluation if the symptoms worsen or if the condition fails to improve as anticipated.  I provided 26 minutes of non-face-to-face time during this encounter.   Alysia Penna, MD     Review of Systems     Objective:   Physical Exam        Assessment & Plan:

## 2022-10-11 ENCOUNTER — Ambulatory Visit: Payer: Medicare Other | Attending: General Surgery | Admitting: Physical Therapy

## 2022-10-11 ENCOUNTER — Other Ambulatory Visit: Payer: Self-pay | Admitting: *Deleted

## 2022-10-11 ENCOUNTER — Encounter: Payer: Self-pay | Admitting: Physical Therapy

## 2022-10-11 DIAGNOSIS — R2681 Unsteadiness on feet: Secondary | ICD-10-CM | POA: Insufficient documentation

## 2022-10-11 DIAGNOSIS — Z9181 History of falling: Secondary | ICD-10-CM | POA: Diagnosis not present

## 2022-10-11 DIAGNOSIS — R262 Difficulty in walking, not elsewhere classified: Secondary | ICD-10-CM | POA: Diagnosis not present

## 2022-10-11 DIAGNOSIS — R2689 Other abnormalities of gait and mobility: Secondary | ICD-10-CM | POA: Insufficient documentation

## 2022-10-11 DIAGNOSIS — M6281 Muscle weakness (generalized): Secondary | ICD-10-CM | POA: Insufficient documentation

## 2022-10-11 DIAGNOSIS — Z483 Aftercare following surgery for neoplasm: Secondary | ICD-10-CM | POA: Insufficient documentation

## 2022-10-11 DIAGNOSIS — R293 Abnormal posture: Secondary | ICD-10-CM | POA: Insufficient documentation

## 2022-10-11 DIAGNOSIS — R296 Repeated falls: Secondary | ICD-10-CM | POA: Diagnosis not present

## 2022-10-11 DIAGNOSIS — M25611 Stiffness of right shoulder, not elsewhere classified: Secondary | ICD-10-CM | POA: Diagnosis not present

## 2022-10-11 NOTE — Therapy (Signed)
OUTPATIENT PHYSICAL THERAPY BREAST CANCER POST OP FOLLOW UP   Patient Name: Emily Horton MRN: [id number] DOB:[birth year], 76 y.o., female Today's Date: 10/11/2022   PT End of Session - 10/11/22 1614     Visit Number 2    Number of Visits 3    Date for PT Re-Evaluation 11/08/22    PT Start Time 52    PT Stop Time 1650    PT Time Calculation (min) 38 min    Activity Tolerance Other (comment)   limited due to drain still in place   Behavior During Therapy [facility] for tasks assessed/performed             Past Medical History:  Diagnosis Date   Allergy    SEASONAL   Anxiety    Arthritis    right hip, right shoulder   Blood transfusion without reported diagnosis    "when had hip surgery"   Cancer ([place]) 07/2022   right breast papillary carcinoma in situ   Colon polyps 09/25/2007   Colonoscopy   COPD (chronic obstructive pulmonary disease) ([place])    smoker 1/2ppd   Depression    Diverticulosis of colon (without mention of hemorrhage) 09/25/2007   Colonoscopy   GERD (gastroesophageal reflux disease)    Gynecological examination    sees Dr. Carren Rang    Hyperlipidemia    Osteopenia    Past Surgical History:  Procedure Laterality Date   COLONOSCOPY  12/30/2015   per Dr. Henrene Pastor, benign  polyps, repeat in 5  yrs.    ESOPHAGOGASTRODUODENOSCOPY  06/30/2012   per Dr. Sharlett Iles, reflux but no Barretts seen    HARDWARE REMOVAL Right 02/01/2020   Procedure: HARDWARE REMOVAL;  Surgeon: Mcarthur Rossetti, MD;  Location: [facility];  Service: Orthopedics;  Laterality: Right;   INTRAMEDULLARY (IM) NAIL INTERTROCHANTERIC Right 06/22/2019   INTRAMEDULLARY (IM) NAIL INTERTROCHANTERIC Right 06/22/2019   Procedure: INTRAMEDULLARY (IM) NAIL INTERTROCHANTRIC;  Surgeon: Altamese [place], MD;  Location: [place];  Service: Orthopedics;  Laterality: Right;   MASTECTOMY W/ SENTINEL NODE BIOPSY Right 09/20/2022   Procedure: RIGHT MASTECTOMY WITH SENTINEL LYMPH NODE BIOPSY;  Surgeon: Jovita Kussmaul, MD;   Location: [place];  Service: General;  Laterality: Right;   POLYPECTOMY     REVERSE SHOULDER ARTHROPLASTY Right 01/29/2020   Procedure: RIGHT REVERSE SHOULDER ARTHROPLASTY;  Surgeon: Marchia Bond, MD;  [facility];  Service: Orthopedics;  Laterality: Right;   TOTAL HIP ARTHROPLASTY Right 02/01/2020   Procedure: TOTAL HIP ARTHROPLASTY ANTERIOR APPROACH;  Surgeon: Mcarthur Rossetti, MD;  [facility];  Service: Orthopedics;  Laterality: Right;   Patient Active Problem List   Diagnosis Date Noted   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   Generalized anxiety disorder 01/13/2021   Fall 04/30/2020   Osteoarthritis, multiple sites 04/30/2020   Pneumonia 03/11/2020   Muscle spasm 03/10/2020   Right flank pain 03/10/2020   Right shoulder pain 02/08/2020   Right hip pain 02/08/2020   Blood loss anemia 02/08/2020   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, right    Retained orthopedic hardware    Closed fracture of right proximal humerus 01/29/2020   Closed right hip fracture, initial encounter ([place]) 06/22/2019   Environmental and seasonal allergies 06/12/2018   Dyslipidemia 08/26/2017   IBS (irritable bowel syndrome) 08/26/2017   Angio-edema 11/03/2016   Diverticulosis of colon without hemorrhage 09/30/2015   Depression,  recurrent ([place]) 08/22/2007   GERD 07/04/2007    PCP: Laurey Morale MD  REFERRING PROVIDER: Autumn Messing III, MD  REFERRING DIAG: [phone] (ICD-10-CM) - 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 If I have to reach behind me it hurts. I can do just about anything with my arm but it may not go up as high as I want it to. Pt's daughter reports she fell 3 times last weekend, this past weekend she fell 2x and EMS has had to come every time to get her up.   PERTINENT HISTORY:  Patient was diagnosed on 08/02/22 with right papillary neoplasm with features suggestive of low grade papillar carcinoma in situ in both the right breast biopsies. Prognostic showed strong positivity in ER and PR at both sites. 09/20/22- R mastectomy with SLNB 0/3   PATIENT GOALS   reduce lymphedema risk and learn post op HEP.    PAIN:  Are you having pain? No just discomfort in axilla, still has drain in place     PRECAUTIONS: Active CA Other: R THA, R shoulder replacement, had R THA revision at the same time as R shoulder replacement Feb 2021  PATIENT GOALS:  Reassess how my recovery is going related to arm function, pain, and swelling.  PAIN:  Are you having pain? No  PRECAUTIONS: Recent Surgery, right UE Lymphedema risk, Fall  ACTIVITY LEVEL / LEISURE: currently not exercising   OBJECTIVE:   PATIENT SURVEYS:  QUICK DASH:  Quick Dash - 10/11/22 0001     Open a tight or new jar Moderate difficulty    Do heavy household chores (wash walls, wash floors) Severe difficulty    Carry a shopping bag or briefcase Moderate difficulty    Wash your back Severe difficulty    Use a knife to cut food Moderate difficulty    Recreational activities in which you take some force or impact through your arm, shoulder, or hand (golf, hammering, tennis) Severe difficulty    During the past week, to what extent has your arm, shoulder or hand problem interfered with your normal social activities with family, friends, neighbors, or groups? Quite a  bit    During the past week, to what  extent has your arm, shoulder or hand problem limited your work or other regular daily activities Quite a bit    Arm, shoulder, or hand pain. Moderate    Tingling (pins and needles) in your arm, shoulder, or hand Moderate    Difficulty Sleeping Mild difficulty    DASH Score 59.09 %              OBSERVATIONS: Drain still in place, healing mastectomy scar with glue still intact  POSTURE:  Forward head and rounded shoulders posture   UPPER EXTREMITY AROM/PROM:   A/PROM RIGHT   eval   RIGHT 10/11/22  Shoulder extension 59 56  Shoulder flexion 131 110  Shoulder abduction 98 92  Shoulder internal rotation 40 54  Shoulder external rotation 47 17                          (Blank rows = not tested)   A/PROM LEFT   eval  Shoulder extension 40  Shoulder flexion 157  Shoulder abduction 174  Shoulder internal rotation 45  Shoulder external rotation 70                          (Blank rows = not tested)     CERVICAL AROM: All within normal limits:      Percent limited  Flexion WFL  Extension 25% limited  Right lateral flexion WFL  Left lateral flexion WFL  Right rotation 25% limited  Left rotation 25% limited        UPPER EXTREMITY STRENGTH: 3+/5 in available range     LYMPHEDEMA ASSESSMENTS:    LANDMARK RIGHT   eval RIGHT 10/11/22  10 cm proximal to olecranon process 32.5 33.1  Olecranon process '28 27  10 '$ cm proximal to ulnar styloid process 24.7 23  Just proximal to ulnar styloid process 17.8 16.6  Across hand at thumb web space 19.6 19.5  At base of 2nd digit 6.5 6  (Blank rows = not tested)   LANDMARK LEFT   eval  10 cm proximal to olecranon process 33.8  Olecranon process 27.6  10 cm proximal to ulnar styloid process 24  Just proximal to ulnar styloid process 16.8  Across hand at thumb web space 20  At base of 2nd digit 6.2  (Blank rows = not tested)      Surgery type/Date: R mastectomy and SLNB 09/20/22 Number of lymph nodes removed:  0/3 Current/past treatment (chemo, radiation, hormone therapy): still waiting to find out if she needs chemo or radiation Other symptoms:  Heaviness/tightness Yes Pain No Pitting edema No Infections No Decreased scar mobility Yes Stemmer sign No  PATIENT EDUCATION:  Education details: importance of beginning PT at neuro rehab to decrease falls Person educated: Patient Education method: Explanation Education comprehension: verbalized understanding  HOME EXERCISE PROGRAM: Reviewed previously given post op HEP.   ASSESSMENT:  CLINICAL IMPRESSION: Pt returns to PT post R mastectomy and SLNB (0/3) on 09/20/22. She still has her drain in place so was unable to do any PROM. Remeasured AROM and her flexion and ER are more limited than eval. In the last  two weeks she has had 5 falls that required EMS to get her up. Educated pt that at this point her LE strength and balance is the most important thing to address. Message sent to Dr. Ethlyn Gallery nurse and an order  was placed for pt to go to [facility] at [place] to work on LE strength and balance. Will reassess pt in 3 weeks once drain is removed and give her a home exercise program to improve R shoulder ROM.   Pt will benefit from skilled therapeutic intervention to improve on the following deficits: Decreased knowledge of precautions, impaired UE functional use, pain, decreased ROM, postural dysfunction.   PT treatment/interventions: ADL/Self care home management, Therapeutic exercises, Patient/Family education, Self Care, Joint mobilization, scar mobilization, and Re-evaluation   GOALS: Goals reviewed with patient? Yes  LONG TERM GOALS:  (STG=LTG)  GOALS Name Target Date  Goal status  1 Pt will demonstrate she has regained full shoulder ROM and function post operatively compared to baselines.  Baseline: 11/08/2022 IN PROGRESS     PLAN:  PT FREQUENCY/DURATION: 1 more visit in 3 weeks once drain is removed to give exercises  PLAN  FOR NEXT SESSION: give ROM exercises in HEP, pt will be going to Neuro PT so she will only come here for one more visit for R shoulder HEP   [facility]  [address], [address]  [place] [address]  [phone]  After Breast Cancer Class It is recommended you attend the ABC class to be educated on lymphedema risk reduction. This class is free of charge and lasts for 1 hour. It is a 1-time class. You will need to download the Webex app either on your phone or computer. We will send you a link the night before or the morning of the class. You should be able to click on that link to join the class. This is not a confidential class. You don't have to turn your camera on, but other participants may be able to see your email address.  Scar massage You can begin gentle scar massage to you incision sites. Gently place one hand on the incision and move the skin (without sliding on the skin) in various directions. Do this for a few minutes and then you can gently massage either coconut oil or vitamin E cream into the scars.  Compression garment You should continue wearing your compression bra until you feel like you no longer have swelling.  Home exercise Program Continue doing the exercises you were given until you feel like you can do them without feeling any tightness at the end.   Walking Program Studies show that 30 minutes of walking per day (fast enough to elevate your heart rate) can significantly reduce the risk of a cancer recurrence. If you can't walk due to other medical reasons, we encourage you to find another activity you could do (like a stationary bike or water exercise).  Posture After breast cancer surgery, people frequently sit with rounded shoulders posture because it puts their incisions on slack and feels better. If you sit like this and scar tissue forms in that position, you can become very tight and have pain sitting or standing with good posture. Try  to be aware of your posture and sit and stand up tall to heal properly.  Follow up PT: It is recommended you return every 3 months for the first 3 years following surgery to be assessed on the SOZO machine for an L-Dex score. This helps prevent clinically significant lymphedema in 95% of patients. These follow up screens are 10 minute appointments that you are not billed for.  Allyson Sabal [name], PT 10/11/2022, 4:57 PM

## 2022-10-14 ENCOUNTER — Other Ambulatory Visit: Payer: Self-pay

## 2022-10-14 ENCOUNTER — Inpatient Hospital Stay: Payer: Medicare Other | Attending: Hematology and Oncology | Admitting: Hematology and Oncology

## 2022-10-14 VITALS — BP 125/86 | HR 103 | Temp 97.9°F | Resp 16 | Ht 64.0 in | Wt 197.0 lb

## 2022-10-14 DIAGNOSIS — Z79811 Long term (current) use of aromatase inhibitors: Secondary | ICD-10-CM | POA: Diagnosis not present

## 2022-10-14 DIAGNOSIS — D0581 Other specified type of carcinoma in situ of right breast: Secondary | ICD-10-CM | POA: Diagnosis not present

## 2022-10-14 DIAGNOSIS — Z9011 Acquired absence of right breast and nipple: Secondary | ICD-10-CM | POA: Diagnosis not present

## 2022-10-14 DIAGNOSIS — Z170 Estrogen receptor positive status [ER+]: Secondary | ICD-10-CM | POA: Diagnosis not present

## 2022-10-14 DIAGNOSIS — D0511 Intraductal carcinoma in situ of right breast: Secondary | ICD-10-CM | POA: Insufficient documentation

## 2022-10-14 MED ORDER — ANASTROZOLE 1 MG PO TABS: 1.0000 mg | ORAL_TABLET | Freq: Every day | ORAL | 3 refills | Status: AC

## 2022-10-14 NOTE — Progress Notes (Signed)
[place] NOTE  Patient Care Team: Laurey Morale, MD as PCP - General (Family Medicine) Benay Pike, MD as Consulting Physician (Hematology and Oncology) Mauro Kaufmann, RN as Oncology Nurse Navigator Rockwell [place], RN as Oncology Nurse Navigator Jovita Kussmaul, MD as Consulting Physician (General Surgery)  CHIEF COMPLAINTS/PURPOSE OF CONSULTATION:  Newly diagnosed breast cancer  HISTORY OF PRESENTING ILLNESS:  Emily Horton 76 y.o. female is here because of recent diagnosis of right breast papillary carcinoma in situ  I reviewed her records extensively and collaborated the history with the patient.  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  Tissue sampling of both of these masses recommended.  No abnormal appearing right axillary lymph nodes.   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  Prognostic showed strong positivity in estrogen and progesterone at both sites.     Interval history  Since last visit she had right mastectomy which showed invasive ductal carcinoma grade 1 arising in a low-grade ductal carcinoma/encapsulated papillary carcinoma, resection margins are negative for carcinoma, sentinel lymph nodes negative for carcinoma.  Rest of the pertinent 10 point ROS reviewed and negative  MEDICAL HISTORY:  Past Medical History:  Diagnosis Date   Allergy    SEASONAL   Anxiety    Arthritis    right hip, right shoulder   Blood transfusion without reported diagnosis    "when had  hip surgery"   Cancer ([place]) 07/2022   right breast papillary carcinoma in situ   Colon polyps 09/25/2007   Colonoscopy   COPD (chronic obstructive pulmonary disease) ([place])    smoker 1/2ppd   Depression    Diverticulosis of colon (without mention of hemorrhage) 09/25/2007   Colonoscopy   GERD (gastroesophageal reflux disease)    Gynecological examination    sees Dr. Carren Rang    Hyperlipidemia    Osteopenia     SURGICAL HISTORY: Past Surgical History:  Procedure Laterality Date   COLONOSCOPY  12/30/2015   per Dr. Henrene Pastor, benign  polyps, repeat in 5  yrs.    ESOPHAGOGASTRODUODENOSCOPY  06/30/2012   per Dr. Sharlett Iles, reflux but no Barretts seen    HARDWARE REMOVAL Right 02/01/2020   Procedure: HARDWARE REMOVAL;  Surgeon: Mcarthur Rossetti, MD;  [facility];  Service: Orthopedics;  Laterality: Right;   INTRAMEDULLARY (IM) NAIL INTERTROCHANTERIC Right 06/22/2019   INTRAMEDULLARY (IM) NAIL INTERTROCHANTERIC Right 06/22/2019   Procedure: INTRAMEDULLARY (IM) NAIL INTERTROCHANTRIC;  Surgeon: Altamese [place], MD;  Location: [place];  Service: Orthopedics;  Laterality: Right;   MASTECTOMY W/ SENTINEL NODE BIOPSY Right 09/20/2022   Procedure: RIGHT MASTECTOMY WITH SENTINEL LYMPH NODE BIOPSY;  Surgeon: Jovita Kussmaul, MD;  Location: [place];  Service: General;  Laterality: Right;   POLYPECTOMY     REVERSE SHOULDER ARTHROPLASTY Right 01/29/2020   Procedure: RIGHT REVERSE SHOULDER ARTHROPLASTY;  Surgeon: Marchia Bond, MD;  [facility];  Service: Orthopedics;  Laterality: Right;   TOTAL HIP ARTHROPLASTY Right 02/01/2020   Procedure: TOTAL HIP ARTHROPLASTY ANTERIOR APPROACH;  Surgeon:  Mcarthur Rossetti, MD;  [facility];  Service: Orthopedics;  Laterality: Right;    SOCIAL HISTORY: Social History   Socioeconomic History   Marital status: Widowed    Spouse name: Not on file   Number of children: 2   Years of education: Not on file   Highest  education level: Not on file  Occupational History   Occupation: retired Pharmacist, hospital  Tobacco Use   Smoking status: Every Day    Packs/day: 0.50    Years: 30.00    Total pack years: 15.00    Types: Cigarettes    Passive exposure: Past   Smokeless tobacco: Never   Tobacco comments:    Patient currently cutting down on cigaretts  Vaping Use   Vaping Use: Never used  Substance and Sexual Activity   Alcohol use: Yes    Alcohol/week: 0.0 standard drinks of alcohol    Comment: occasional   Drug use: No   Sexual activity: Not Currently    Birth control/protection: Post-menopausal  Other Topics Concern   Not on file  Social History Narrative   Right handed   Social Determinants of Health   Financial Resource Strain: Low Risk  (05/11/2022)   Overall Financial Resource Strain (CARDIA)    Difficulty of Paying Living Expenses: Not hard at all  Food Insecurity: No Food Insecurity (05/11/2022)   Hunger Vital Sign    Worried About Running Out of Food in the Last Year: Never true    Ran Out of Food in the Last Year: Never true  Transportation Needs: No Transportation Needs (05/11/2022)   PRAPARE - Hydrologist (Medical): No    Lack of Transportation (Non-Medical): No  Physical Activity: Inactive (05/11/2022)   Exercise Vital Sign    Days of Exercise per Week: 0 days    Minutes of Exercise per Session: 0 min  Stress: No Stress Concern Present (05/11/2022)   [place]    Feeling of Stress : Not at all  Social Connections: Moderately Integrated (05/11/2022)   Social Connection and Isolation Panel [NHANES]    Frequency of Communication with Friends and Family: More than three times a week    Frequency of Social Gatherings with Friends and Family: More than three times a week    Attends Religious Services: More than 4 times per year    Active Member of [employer] or Organizations: Yes    Attends Theatre manager Meetings: 1 to 4 times per year    Marital Status: Widowed  Intimate Partner Violence: Not At Risk (05/11/2022)   Humiliation, Afraid, Rape, and Kick questionnaire    Fear of Current or Ex-Partner: No    Emotionally Abused: No    Physically Abused: No    Sexually Abused: No    FAMILY HISTORY: Family History  Problem Relation Age of Onset   Crohn's disease Father    Colon polyps Father    Breast cancer Maternal Grandmother    Stroke Paternal Grandfather    Colon cancer Neg Hx    Esophageal cancer Neg Hx    Rectal cancer Neg Hx    Stomach cancer Neg Hx     ALLERGIES:  is allergic to eggs or egg-derived products and zoloft [sertraline hcl].  MEDICATIONS:  Current Outpatient Medications  Medication Sig Dispense Refill   anastrozole (ARIMIDEX) 1 MG tablet Take 1 tablet (1 mg total) by mouth daily. 90 tablet 3   buPROPion [facility]  XL) 300 MG 24 hr tablet TAKE 1 TABLET(300 MG) BY MOUTH EVERY MORNING 90 tablet 3   busPIRone (BUSPAR) 15 MG tablet TAKE 1 TABLET(15 MG) BY MOUTH TWICE DAILY 60 tablet 1   cetirizine (ZYRTEC) 10 MG tablet TAKE 1 TABLET(10 MG) BY MOUTH DAILY 90 tablet 3   ciprofloxacin (CIPRO) 500 MG tablet Take 1 tablet (500 mg total) by mouth 2 (two) times daily. 14 tablet 0   clonazePAM (KLONOPIN) 0.5 MG tablet Take 1 tablet (0.5 mg total) by mouth 3 (three) times daily as needed for anxiety. 90 tablet 2   denosumab (PROLIA) 60 MG/ML SOSY injection Inject 60 mg into the skin every 6 (six) months.     EPINEPHrine 0.3 mg/0.3 mL IJ SOAJ injection Inject 0.3 mLs (0.3 mg total) into the muscle daily as needed for anaphylaxis. 1 each 0   meloxicam (MOBIC) 15 MG tablet Take 1 tablet (15 mg total) by mouth daily. (Patient taking differently: Take 15 mg by mouth every other day.) 90 tablet 3   Menthol, Topical Analgesic, (BIOFREEZE) 4 % GEL Apply topically. APPLY TO RIGHT SHOULDER Twice A Day - PRN     methocarbamol (ROBAXIN) 500 MG tablet Take 1 tablet (500 mg total)  by mouth every 6 (six) hours as needed for muscle spasms. 30 tablet 1   omeprazole (PRILOSEC) 40 MG capsule Take 1 capsule (40 mg total) by mouth 2 (two) times daily. 180 capsule 3   ondansetron (ZOFRAN) 4 MG tablet Take 1 tablet (4 mg total) by mouth every 8 (eight) hours as needed for nausea or vomiting. 10 tablet 0   oxyCODONE (OXY IR/ROXICODONE) 5 MG immediate release tablet Take 1-2 tablets (5-10 mg total) by mouth every 6 (six) hours as needed for moderate pain. 15 tablet 0   polyethylene glycol (MIRALAX / GLYCOLAX) 17 g packet Take 17 g by mouth daily as needed for moderate constipation or severe constipation. 14 each 0   sennosides-docusate sodium (SENOKOT-S) 8.6-50 MG tablet Take 2 tablets by mouth daily.     triamcinolone cream (KENALOG) 0.1 % Apply 1 application topically 2 (two) times daily. 453.6 g 3   No current facility-administered medications for this visit.    REVIEW OF SYSTEMS:   Constitutional: Denies fevers, chills or abnormal night sweats Eyes: Denies blurriness of vision, double vision or watery eyes Ears, nose, mouth, throat, and face: Denies mucositis or sore throat Respiratory: Denies cough, dyspnea or wheezes Cardiovascular: Denies palpitation, chest discomfort or lower extremity swelling Gastrointestinal:  Denies nausea, heartburn or change in bowel habits Skin: Denies abnormal skin rashes Lymphatics: Denies new lymphadenopathy or easy bruising Neurological:Denies numbness, tingling or new weaknesses Behavioral/Psych: Mood is stable, no new changes  Breast: Denies any palpable lumps or discharge All other systems were reviewed with the patient and are negative.  PHYSICAL EXAMINATION: ECOG PERFORMANCE STATUS: 0 - Asymptomatic  Vitals:   10/14/22 1447  BP: 125/86  Pulse: (!) 103  Resp: 16  Temp: 97.9 F (36.6 C)  SpO2: 95%   Filed Weights   10/14/22 1447  Weight: 197 lb (89.4 kg)    GENERAL:alert, no distress and comfortable, anxious Mastectomy  site healing well.  LABORATORY DATA:  I have reviewed the data as listed Lab Results  Component Value Date   WBC 20.4 (H) 10/02/2022   HGB 15.3 (H) 10/02/2022   HCT 45.0 10/02/2022   MCV 94.7 10/02/2022   PLT 233 10/02/2022   Lab Results  Component Value Date   NA 137  10/02/2022   K 4.2 10/02/2022   CL 104 10/02/2022   CO2 20 (L) 10/02/2022    RADIOGRAPHIC STUDIES: I have personally reviewed the radiological reports and agreed with the findings in the report.  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 Marlou Starks at [facility] who is now status post mastectomy and is here to review final pathology and to initiate antiestrogen therapy.  Mastectomy showed grade 1 encapsulated papillary carcinoma measuring 2.4 cm invasive component out of the 4.3 cm lesion.  This is strongly ER/PR positive.  No evidence of lymph node involvement.  We have once again today discussed about options for antiestrogen therapy including tamoxifen versus aromatase inhibitors.  Given risk of DVT/PE and relatively sedentary habitus, she is willing to start anastrozole.  She is already on medication for osteoporosis.  She understands that risk of bone density loss with aromatase inhibitors.  We also discussed about sex including postmenopausal symptoms such as hot flashes, vaginal dryness, arthralgias etc.  Anastrozole dispensed to the pharmacy of her choice.  She will return to clinic in about 3 to 4 months for follow-up.  Total time spent: 30 minutes including history, physical exam, review of records, counseling and coordination of care All questions were answered. The patient knows to call the clinic with any problems, questions or concerns.     Benay Pike, MD 10/15/22

## 2022-10-15 ENCOUNTER — Encounter: Payer: Self-pay | Admitting: *Deleted

## 2022-10-15 ENCOUNTER — Encounter: Payer: Self-pay | Admitting: Hematology and Oncology

## 2022-10-15 NOTE — Assessment & Plan Note (Signed)
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 Marlou Starks at [facility] surgery who is now status post mastectomy and is here to review final pathology and to initiate antiestrogen therapy.  Mastectomy showed grade 1 encapsulated papillary carcinoma measuring 2.4 cm invasive component out of the 4.3 cm lesion.  This is strongly ER/PR positive.  No evidence of lymph node involvement.  We have once again today discussed about options for antiestrogen therapy including tamoxifen versus aromatase inhibitors.  Given risk of DVT/PE and relatively sedentary habitus, she is willing to start anastrozole.  She is already on medication for osteoporosis.  She understands that risk of bone density loss with aromatase inhibitors.  We also discussed about sex including postmenopausal symptoms such as hot flashes, vaginal dryness, arthralgias etc.  Anastrozole dispensed to the pharmacy of her choice.  She will return to clinic in about 3 to 4 months for follow-up.

## 2022-10-18 ENCOUNTER — Encounter: Payer: Self-pay | Admitting: *Deleted

## 2022-10-18 DIAGNOSIS — D0581 Other specified type of carcinoma in situ of right breast: Secondary | ICD-10-CM

## 2022-10-20 NOTE — Therapy (Signed)
OUTPATIENT PHYSICAL THERAPY NEURO EVALUATION   Patient Name: Emily Horton MRN: [id number] DOB:1946/09/20, 76 y.o., female Today's Date: 10/21/2022   PCP: Laurey Morale, MD  REFERRING PROVIDER: Jovita Kussmaul, MD   PT End of Session - 10/21/22 1653     Visit Number 1    Number of Visits 17    Date for PT Re-Evaluation 12/16/22    Authorization Type Medicare A and B    Authorization - Visit Number 3   3rd visit in current episode of care   Authorization - Number of Visits --   already used 13 visits in 2023   Progress Note Due on Visit 10    PT Start Time 1245    PT Stop Time 1612    PT Time Calculation (min) 37 min    Equipment Utilized During Treatment Gait belt    Activity Tolerance Patient tolerated treatment well    Behavior During Therapy WFL for tasks assessed/performed             Past Medical History:  Diagnosis Date   Allergy    SEASONAL   Anxiety    Arthritis    right hip, right shoulder   Blood transfusion without reported diagnosis    "when had hip surgery"   Cancer ([place]) 07/2022   right breast papillary carcinoma in situ   Colon polyps 09/25/2007   Colonoscopy   COPD (chronic obstructive pulmonary disease) ([place])    smoker 1/2ppd   Depression    Diverticulosis of colon (without mention of hemorrhage) 09/25/2007   Colonoscopy   GERD (gastroesophageal reflux disease)    Gynecological examination    sees Dr. Carren Rang    Hyperlipidemia    Osteopenia    Past Surgical History:  Procedure Laterality Date   COLONOSCOPY  12/30/2015   per Dr. Henrene Pastor, benign  polyps, repeat in 5  yrs.    ESOPHAGOGASTRODUODENOSCOPY  06/30/2012   per Dr. Sharlett Iles, reflux but no Barretts seen    HARDWARE REMOVAL Right 02/01/2020   Procedure: HARDWARE REMOVAL;  Surgeon: Mcarthur Rossetti, MD;  [facility];  Service: Orthopedics;  Laterality: Right;   INTRAMEDULLARY (IM) NAIL INTERTROCHANTERIC Right 06/22/2019   INTRAMEDULLARY (IM) NAIL INTERTROCHANTERIC Right  06/22/2019   Procedure: INTRAMEDULLARY (IM) NAIL INTERTROCHANTRIC;  Surgeon: Altamese Otis, MD;  Location: [place];  Service: Orthopedics;  Laterality: Right;   MASTECTOMY W/ SENTINEL NODE BIOPSY Right 09/20/2022   Procedure: RIGHT MASTECTOMY WITH SENTINEL LYMPH NODE BIOPSY;  Surgeon: Jovita Kussmaul, MD;  Location: [place];  Service: General;  Laterality: Right;   POLYPECTOMY     REVERSE SHOULDER ARTHROPLASTY Right 01/29/2020   Procedure: RIGHT REVERSE SHOULDER ARTHROPLASTY;  Surgeon: Marchia Bond, MD;  [facility];  Service: Orthopedics;  Laterality: Right;   TOTAL HIP ARTHROPLASTY Right 02/01/2020   Procedure: TOTAL HIP ARTHROPLASTY ANTERIOR APPROACH;  Surgeon: Mcarthur Rossetti, MD;  [facility];  Service: Orthopedics;  Laterality: Right;   Patient Active Problem List   Diagnosis Date Noted   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   Generalized anxiety disorder 01/13/2021   Fall 04/30/2020   Osteoarthritis, multiple sites 04/30/2020   Pneumonia 03/11/2020   Muscle spasm 03/10/2020   Right flank pain 03/10/2020   Right shoulder pain 02/08/2020   Right hip pain 02/08/2020   Blood loss anemia 02/08/2020   Closed comminuted intertrochanteric fracture of proximal end of  femur with nonunion, right    Retained orthopedic hardware    Closed fracture of right proximal humerus 01/29/2020   Closed right hip fracture, initial encounter ([place]) 06/22/2019   Environmental and seasonal allergies 06/12/2018   Dyslipidemia 08/26/2017   IBS (irritable bowel syndrome) 08/26/2017   Angio-edema 11/03/2016   Diverticulosis of colon without hemorrhage 09/30/2015   Depression, recurrent ([place]) 08/22/2007   GERD 07/04/2007    ONSET DATE: 2 years ago  REFERRING DIAG: Z91.81 (ICD-10-CM) - At risk for falls  THERAPY DIAG:  Muscle weakness (generalized)  Unsteadiness on feet  Other  abnormalities of gait and mobility  Repeated falls  Rationale for Evaluation and Treatment: Rehabilitation  SUBJECTIVE:                                                                                                                                                                                             SUBJECTIVE STATEMENT: Has been having falls. Now has a home helper come for the past 2-3 weeks to help with shower, cooking, dressing. Had R breast surgery last month and seeing rehab for that right now. Walking with a walker for the past 2 years. Reports short distance walking with the walker now d/t anxiety about falling. Has a w/c at home but not using it much. Using w/c at MD appointments. Reports dizziness when getting out of bed too quickly.   Pt accompanied by: self  PERTINENT HISTORY: Anxiety, R breast CA s/p mastectomy 09/20/22, COPD, depression, GERD, HLD, osteopenia, R femur IM nailing 2020, R reverse TSA 2021, R THA 2021  PAIN:  Are you having pain? Yes: NPRS scale: 0/10 Pain location: R breast Pain description: shooting Aggravating factors: spontaneous Relieving factors: nothing  PRECAUTIONS: Fall and Other: R mastectomy- no BP; Per MD note 10/12/22 "The patient can start stretching the right arm". Currently being seen an [facility] for CA rehab.  WEIGHT BEARING RESTRICTIONS: No  FALLS: Has patient fallen in last 6 months? Yes. Number of falls 2-3x/week  LIVING ENVIRONMENT: Lives with: lives alone Lives in: House/apartment Stairs:  4 steps to enter with 1 handrail; 2 story home- lives on 1st floor Has following equipment at home: Gilford Rile - 2 wheeled, Environmental consultant - 4 wheeled, Wheelchair (manual), [employer], and Grab bars  PLOF: Independent  PATIENT GOALS: improve balance  OBJECTIVE:   DIAGNOSTIC FINDINGS: none recent  COGNITION: Overall cognitive status: Within functional limits for tasks assessed   SENSATION: Reports occasional R foot decreased sensation    POSTURE: rounded shoulders  LOWER EXTREMITY ROM:     Active  Right Eval Left Eval  Hip flexion  Hip extension    Hip abduction    Hip adduction    Hip internal rotation    Hip external rotation    Knee flexion    Knee extension    Ankle dorsiflexion 10 5  Ankle plantarflexion    Ankle inversion    Ankle eversion     (Blank rows = not tested)  LOWER EXTREMITY MMT:    MMT (in sitting) Right Eval Left Eval  Hip flexion 4+ 4  Hip extension    Hip abduction 4 4+  Hip adduction 4+ 4+  Hip internal rotation    Hip external rotation    Knee flexion 4- 4  Knee extension 4- 4-  Ankle dorsiflexion 5 4  Ankle plantarflexion 4 3+  Ankle inversion    Ankle eversion    (Blank rows = not tested)    GAIT: Gait pattern:  B short step length with poor foot clearance; difficulty maneuvering walker and turning safely Assistive device utilized: Walker - 2 wheeled Level of assistance: CGA  FUNCTIONAL TESTS:   [facility] PT Assessment - 10/21/22 0001       Standardized Balance Assessment   Standardized Balance Assessment Five Times Sit to Stand;Timed Up and Go Test    Five times sit to stand comments  22.63 sec holding on armrests of wheelchair the whole time and unable to fully stand on last couple reps d/t fatigue      Timed Up and Go Test   Normal TUG (seconds) --   1 min 7 sec with RW and CGA; difficulty turning and maneuvering walker              TODAY'S TREATMENT:                                                                                                                              DATE: 10/21/22    PATIENT EDUCATION: Education details: prognosis, POC, HEP (in patient instructions) Person educated: Patient Education method: Explanation, Demonstration, Tactile cues, Verbal cues, and Handouts Education comprehension: verbalized understanding and returned demonstration    GOALS: Goals reviewed with patient? Yes  SHORT TERM GOALS: Target date:  11/11/2022  Patient to be independent with initial HEP. Baseline: HEP initiated Goal status: INITIAL    LONG TERM GOALS: Target date: 12/16/2022  Patient to be independent with advanced HEP. Baseline: Not yet initiated  Goal status: INITIAL  Patient to demonstrate B LE strength >/=4+/5.  Baseline: See above Goal status: INITIAL  Patient to demonstrate B ankle dorsiflexion AROM to 10 degrees. Baseline: 10, 5 deg Goal status: INITIAL  Patient to complete TUG in <25 sec with LRAD in order to decrease risk of falls.   Baseline: 1 min 7 sec Goal status: INITIAL  Patient to demonstrate 5xSTS test in <15 sec in order to decrease risk of falls.  Baseline: 22.6 sec with B armrests Goal status: INITIAL  Patient to score at least 40/56 on Berg in  order to decrease risk of falls.  Baseline: NT Goal status: INITIAL  Patient to complete 317f of gait on level surface with LRAD in order to improve access to home/community. Baseline: unable Goal status: INITIAL   ASSESSMENT:  CLINICAL IMPRESSION:  Patient is a 76y/o F presenting to OPPT with c/o repeated falls and limited mobility for the past few years. Of note, patient is s/p R mastectomy on 09/20/22. Today she arrives in a w/c and reports only limited ambulation distance with RW at home. Patient today presenting with rounded shoulders, limited L>R ankle dorsiflexion AROM, decreased knee and ankle strength, gait deviations, increased time required for gait and transfers. Patient's scores on TUG and 5xSTS indicates an increased risk of falls. Patient was educated on gentle strengthening HEP and reported understanding. Would benefit from skilled PT services 2 x/week for 8 weeks to address aforementioned impairments in order to optimize level of function.    OBJECTIVE IMPAIRMENTS: Abnormal gait, decreased activity tolerance, decreased balance, decreased endurance, decreased mobility, difficulty walking, decreased ROM, decreased strength,  decreased safety awareness, dizziness, impaired flexibility, improper body mechanics, postural dysfunction, and pain.   ACTIVITY LIMITATIONS: carrying, lifting, bending, sitting, standing, squatting, sleeping, stairs, transfers, bed mobility, bathing, toileting, dressing, reach over head, hygiene/grooming, locomotion level, and caring for others  PARTICIPATION LIMITATIONS: meal prep, cleaning, laundry, shopping, community activity, and church  PERSONAL FACTORS: Age, Fitness, Past/current experiences, Time since onset of injury/illness/exacerbation, and 3+ comorbidities: Anxiety, R breast CA s/p mastectomy 09/20/22, COPD, depression, GERD, HLD, osteopenia, R femur IM nailing 2020, R reverse TSA 2021, R THA 2021  are also affecting patient's functional outcome.   REHAB POTENTIAL: Good  CLINICAL DECISION MAKING: Evolving/moderate complexity  EVALUATION COMPLEXITY: Moderate  PLAN:  PT FREQUENCY: 2x/week  PT DURATION: 8 weeks  PLANNED INTERVENTIONS: Therapeutic exercises, Therapeutic activity, Neuromuscular re-education, Balance training, Gait training, Patient/Family education, Self Care, Joint mobilization, Joint manipulation, Stair training, Vestibular training, Canalith repositioning, DME instructions, Aquatic Therapy, Dry Needling, Cryotherapy, Moist heat, Taping, Manual therapy, and Re-evaluation  PLAN FOR NEXT SESSION: assess dizziness? Patient mentions dizziness when getting out of bed; reassess HEP, continue working on STS, gait training with RW, LE strengthening     YJanene Harvey PT, DPT 10/21/22 5:07 PM  [facility] [address]Phone # [phone] # [phone]

## 2022-10-21 ENCOUNTER — Encounter: Payer: Self-pay | Admitting: Physical Therapy

## 2022-10-21 ENCOUNTER — Other Ambulatory Visit: Payer: Self-pay

## 2022-10-21 ENCOUNTER — Ambulatory Visit: Payer: Medicare Other | Admitting: Physical Therapy

## 2022-10-21 DIAGNOSIS — R2689 Other abnormalities of gait and mobility: Secondary | ICD-10-CM | POA: Diagnosis not present

## 2022-10-21 DIAGNOSIS — R2681 Unsteadiness on feet: Secondary | ICD-10-CM | POA: Diagnosis not present

## 2022-10-21 DIAGNOSIS — Z483 Aftercare following surgery for neoplasm: Secondary | ICD-10-CM | POA: Diagnosis not present

## 2022-10-21 DIAGNOSIS — R293 Abnormal posture: Secondary | ICD-10-CM | POA: Diagnosis not present

## 2022-10-21 DIAGNOSIS — M6281 Muscle weakness (generalized): Secondary | ICD-10-CM | POA: Diagnosis not present

## 2022-10-21 DIAGNOSIS — M25611 Stiffness of right shoulder, not elsewhere classified: Secondary | ICD-10-CM | POA: Diagnosis not present

## 2022-10-21 DIAGNOSIS — R296 Repeated falls: Secondary | ICD-10-CM

## 2022-10-25 ENCOUNTER — Ambulatory Visit: Payer: Medicare Other

## 2022-10-25 DIAGNOSIS — M6281 Muscle weakness (generalized): Secondary | ICD-10-CM | POA: Diagnosis not present

## 2022-10-25 DIAGNOSIS — R2689 Other abnormalities of gait and mobility: Secondary | ICD-10-CM | POA: Diagnosis not present

## 2022-10-25 DIAGNOSIS — M25611 Stiffness of right shoulder, not elsewhere classified: Secondary | ICD-10-CM | POA: Diagnosis not present

## 2022-10-25 DIAGNOSIS — R2681 Unsteadiness on feet: Secondary | ICD-10-CM

## 2022-10-25 DIAGNOSIS — R293 Abnormal posture: Secondary | ICD-10-CM | POA: Diagnosis not present

## 2022-10-25 DIAGNOSIS — R262 Difficulty in walking, not elsewhere classified: Secondary | ICD-10-CM

## 2022-10-25 DIAGNOSIS — R296 Repeated falls: Secondary | ICD-10-CM

## 2022-10-25 DIAGNOSIS — Z483 Aftercare following surgery for neoplasm: Secondary | ICD-10-CM | POA: Diagnosis not present

## 2022-10-25 NOTE — Therapy (Signed)
OUTPATIENT PHYSICAL THERAPY NEURO TREATMENT   Patient Name: Emily Horton MRN: [id number] DOB:1946-05-05, 76 y.o., female Today's Date: 10/25/2022   PCP: Laurey Morale, MD  REFERRING PROVIDER: Jovita Kussmaul, MD   PT End of Session - 10/25/22 1012     Visit Number 2    Number of Visits 17    Date for PT Re-Evaluation 12/16/22    Authorization Type Medicare A and B    Authorization - Visit Number 4    Authorization - Number of Visits --   already used 13 visits in 2023   Progress Note Due on Visit 10    PT Start Time 0109    PT Stop Time 1100    PT Time Calculation (min) 45 min    Equipment Utilized During Treatment Gait belt    Activity Tolerance Patient tolerated treatment well    Behavior During Therapy WFL for tasks assessed/performed             Past Medical History:  Diagnosis Date   Allergy    SEASONAL   Anxiety    Arthritis    right hip, right shoulder   Blood transfusion without reported diagnosis    "when had hip surgery"   Cancer ([place]) 07/2022   right breast papillary carcinoma in situ   Colon polyps 09/25/2007   Colonoscopy   COPD (chronic obstructive pulmonary disease) ([place])    smoker 1/2ppd   Depression    Diverticulosis of colon (without mention of hemorrhage) 09/25/2007   Colonoscopy   GERD (gastroesophageal reflux disease)    Gynecological examination    sees Dr. Carren Rang    Hyperlipidemia    Osteopenia    Past Surgical History:  Procedure Laterality Date   COLONOSCOPY  12/30/2015   per Dr. Henrene Pastor, benign  polyps, repeat in 5  yrs.    ESOPHAGOGASTRODUODENOSCOPY  06/30/2012   per Dr. Sharlett Iles, reflux but no Barretts seen    HARDWARE REMOVAL Right 02/01/2020   Procedure: HARDWARE REMOVAL;  Surgeon: Mcarthur Rossetti, MD;  [facility];  Service: Orthopedics;  Laterality: Right;   INTRAMEDULLARY (IM) NAIL INTERTROCHANTERIC Right 06/22/2019   INTRAMEDULLARY (IM) NAIL INTERTROCHANTERIC Right 06/22/2019   Procedure: INTRAMEDULLARY  (IM) NAIL INTERTROCHANTRIC;  Surgeon: Altamese [place], MD;  Location: [place];  Service: Orthopedics;  Laterality: Right;   MASTECTOMY W/ SENTINEL NODE BIOPSY Right 09/20/2022   Procedure: RIGHT MASTECTOMY WITH SENTINEL LYMPH NODE BIOPSY;  Surgeon: Jovita Kussmaul, MD;  Location: [place];  Service: General;  Laterality: Right;   POLYPECTOMY     REVERSE SHOULDER ARTHROPLASTY Right 01/29/2020   Procedure: RIGHT REVERSE SHOULDER ARTHROPLASTY;  Surgeon: Marchia Bond, MD;  [facility];  Service: Orthopedics;  Laterality: Right;   TOTAL HIP ARTHROPLASTY Right 02/01/2020   Procedure: TOTAL HIP ARTHROPLASTY ANTERIOR APPROACH;  Surgeon: Mcarthur Rossetti, MD;  [facility];  Service: Orthopedics;  Laterality: Right;   Patient Active Problem List   Diagnosis Date Noted   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   Generalized anxiety disorder 01/13/2021   Fall 04/30/2020   Osteoarthritis, multiple sites 04/30/2020   Pneumonia 03/11/2020   Muscle spasm 03/10/2020   Right flank pain 03/10/2020   Right shoulder pain 02/08/2020   Right hip pain 02/08/2020   Blood loss anemia 02/08/2020   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, right    Retained  orthopedic hardware    Closed fracture of right proximal humerus 01/29/2020   Closed right hip fracture, initial encounter ([place]) 06/22/2019   Environmental and seasonal allergies 06/12/2018   Dyslipidemia 08/26/2017   IBS (irritable bowel syndrome) 08/26/2017   Angio-edema 11/03/2016   Diverticulosis of colon without hemorrhage 09/30/2015   Depression, recurrent ([place]) 08/22/2007   GERD 07/04/2007    ONSET DATE: 2 years ago  REFERRING DIAG: Z91.81 (ICD-10-CM) - At risk for falls  THERAPY DIAG:  Muscle weakness (generalized)  Unsteadiness on feet  Other abnormalities of gait and mobility  Repeated  falls  Difficulty in walking, not elsewhere classified  Rationale for Evaluation and Treatment: Rehabilitation  SUBJECTIVE:                                                                                                                                                                                             SUBJECTIVE STATEMENT: Pt denies any dizziness when in bed and rolling side to side, mainly occurs when arising form supine to sitting EOB.   Pt accompanied by: self  PERTINENT HISTORY: Anxiety, R breast CA s/p mastectomy 09/20/22, COPD, depression, GERD, HLD, osteopenia, R femur IM nailing 2020, R reverse TSA 2021, R THA 2021  PAIN:  Are you having pain? Yes: NPRS scale: 0/10 Pain location: R breast Pain description: shooting Aggravating factors: spontaneous Relieving factors: nothing  PRECAUTIONS: Fall and Other: R mastectomy- no BP; Per MD note 10/12/22 "The patient can start stretching the right arm". Currently being seen an [facility] for CA rehab.  WEIGHT BEARING RESTRICTIONS: No  FALLS: Has patient fallen in last 6 months? Yes. Number of falls 2-3x/week  LIVING ENVIRONMENT: Lives with: lives alone Lives in: House/apartment Stairs:  4 steps to enter with 1 handrail; 2 story home- lives on 1st floor Has following equipment at home: Gilford Rile - 2 wheeled, Environmental consultant - 4 wheeled, Wheelchair (manual), [employer], and Grab bars  PLOF: Independent  PATIENT GOALS: improve balance  OBJECTIVE:   TODAY'S TREATMENT: 10/25/22 Activity Comments  HEP review -1x10 STS elevated surface -2x10 heel raise 1x10 mini-squats--verbal, visual cues for form  Sidestepping at counter   Foot on step 6x10 sec foot elevated in sink to improve single limb support/balance   Seated LE PRE 3x10, 3# -LAQ -hip add iso -hip flexion -clamshells green loop           DIAGNOSTIC FINDINGS: none recent  COGNITION: Overall cognitive status: Within functional limits for tasks  assessed   SENSATION: Reports occasional R foot decreased sensation   POSTURE: rounded shoulders  LOWER EXTREMITY ROM:     Active  Right Eval Left Eval  Hip flexion    Hip extension    Hip abduction    Hip adduction    Hip internal rotation    Hip external rotation    Knee flexion    Knee extension    Ankle dorsiflexion 10 5  Ankle plantarflexion    Ankle inversion    Ankle eversion     (Blank rows = not tested)  LOWER EXTREMITY MMT:    MMT (in sitting) Right Eval Left Eval  Hip flexion 4+ 4  Hip extension    Hip abduction 4 4+  Hip adduction 4+ 4+  Hip internal rotation    Hip external rotation    Knee flexion 4- 4  Knee extension 4- 4-  Ankle dorsiflexion 5 4  Ankle plantarflexion 4 3+  Ankle inversion    Ankle eversion    (Blank rows = not tested)    GAIT: Gait pattern:  B short step length with poor foot clearance; difficulty maneuvering walker and turning safely Assistive device utilized: Walker - 2 wheeled Level of assistance: CGA  FUNCTIONAL TESTS:     HEP:  Access Code: [email or web address] URL: [email or web address] Date: 10/25/2022 Prepared by: Sherlyn Lees  Exercises - Sit to Stand  - 1 x daily - 7 x weekly - 3 sets - 10 reps - Heel Raises with Counter Support  - 1 x daily - 7 x weekly - 3 sets - 10 reps - Standing Gastroc Stretch at Counter  - 1 x daily - 7 x weekly - 3 sets - 30 sec hold - Side Stepping with Counter Support  - 1 x daily - 7 x weekly - 3 sets - 10 reps - Alternating Step Taps with Counter Support  - 1 x daily - 7 x weekly - 3-5 sets - 10 sec hold   PATIENT EDUCATION: Education details: prognosis, POC, HEP (in patient instructions) Person educated: Patient Education method: Explanation, Demonstration, Tactile cues, Verbal cues, and Handouts Education comprehension: verbalized understanding and returned demonstration    GOALS: Goals reviewed with patient? Yes  SHORT TERM GOALS: Target date:  11/11/2022  Patient to be independent with initial HEP. Baseline: HEP initiated Goal status: INITIAL    LONG TERM GOALS: Target date: 12/16/2022  Patient to be independent with advanced HEP. Baseline: Not yet initiated  Goal status: INITIAL  Patient to demonstrate B LE strength >/=4+/5.  Baseline: See above Goal status: INITIAL  Patient to demonstrate B ankle dorsiflexion AROM to 10 degrees. Baseline: 10, 5 deg Goal status: INITIAL  Patient to complete TUG in <25 sec with LRAD in order to decrease risk of falls.   Baseline: 1 min 7 sec Goal status: INITIAL  Patient to demonstrate 5xSTS test in <15 sec in order to decrease risk of falls.  Baseline: 22.6 sec with B armrests Goal status: INITIAL  Patient to score at least 40/56 on Berg in order to decrease risk of falls.  Baseline: NT Goal status: INITIAL  Patient to complete 319f of gait on level surface with LRAD in order to improve access to home/community. Baseline: unable Goal status: INITIAL   ASSESSMENT:  CLINICAL IMPRESSION: Continued with HEP development for LE strength and emphasis on right hip abduction and techniques to facilitate single limb stability.  Cues for right foot clearance during gait due to step-to pattern. Additional visits indicated to progress program to improve static/dynamic balance as well as improve gait issues to reduce risk fo rfalls   OBJECTIVE IMPAIRMENTS: Abnormal gait,  decreased activity tolerance, decreased balance, decreased endurance, decreased mobility, difficulty walking, decreased ROM, decreased strength, decreased safety awareness, dizziness, impaired flexibility, improper body mechanics, postural dysfunction, and pain.   ACTIVITY LIMITATIONS: carrying, lifting, bending, sitting, standing, squatting, sleeping, stairs, transfers, bed mobility, bathing, toileting, dressing, reach over head, hygiene/grooming, locomotion level, and caring for others  PARTICIPATION LIMITATIONS: meal  prep, cleaning, laundry, shopping, community activity, and church  PERSONAL FACTORS: Age, Fitness, Past/current experiences, Time since onset of injury/illness/exacerbation, and 3+ comorbidities: Anxiety, R breast CA s/p mastectomy 09/20/22, COPD, depression, GERD, HLD, osteopenia, R femur IM nailing 2020, R reverse TSA 2021, R THA 2021  are also affecting patient's functional outcome.   REHAB POTENTIAL: Good  CLINICAL DECISION MAKING: Evolving/moderate complexity  EVALUATION COMPLEXITY: Moderate  PLAN:  PT FREQUENCY: 2x/week  PT DURATION: 8 weeks  PLANNED INTERVENTIONS: Therapeutic exercises, Therapeutic activity, Neuromuscular re-education, Balance training, Gait training, Patient/Family education, Self Care, Joint mobilization, Joint manipulation, Stair training, Vestibular training, Canalith repositioning, DME instructions, Aquatic Therapy, Dry Needling, Cryotherapy, Moist heat, Taping, Manual therapy, and Re-evaluation  PLAN FOR NEXT SESSION: a reassess HEP, continue working on STS, gait training with RW, LE strengthening    11:17 AM, 10/25/22 M. Sherlyn Lees, PT, DPT Physical Therapist- [place] Office Number: [phone]   [place] [facility] [address], [place], [place] [address] Phone # [phone] Fax # [phone]

## 2022-11-01 ENCOUNTER — Ambulatory Visit: Payer: Medicare Other | Admitting: Physical Therapy

## 2022-11-01 NOTE — Therapy (Signed)
OUTPATIENT PHYSICAL THERAPY NEURO TREATMENT   Patient Name: Emily Horton MRN: [id number] DOB:Apr 10, 1946, 76 y.o., female Today's Date: 11/02/2022   PCP: Laurey Morale, MD  REFERRING PROVIDER: Jovita Kussmaul, MD   PT End of Session - 11/02/22 1143     Visit Number 3    Number of Visits 17    Date for PT Re-Evaluation 12/16/22    Authorization Type Medicare A and B    Authorization - Visit Number 5    Authorization - Number of Visits --   already used 13 visits in 2023   Progress Note Due on Visit 10    PT Start Time 1054    PT Stop Time 1142    PT Time Calculation (min) 48 min    Equipment Utilized During Treatment Gait belt    Activity Tolerance Patient tolerated treatment well    Behavior During Therapy WFL for tasks assessed/performed              Past Medical History:  Diagnosis Date   Allergy    SEASONAL   Anxiety    Arthritis    right hip, right shoulder   Blood transfusion without reported diagnosis    "when had hip surgery"   Cancer ([place]) 07/2022   right breast papillary carcinoma in situ   Colon polyps 09/25/2007   Colonoscopy   COPD (chronic obstructive pulmonary disease) ([place])    smoker 1/2ppd   Depression    Diverticulosis of colon (without mention of hemorrhage) 09/25/2007   Colonoscopy   GERD (gastroesophageal reflux disease)    Gynecological examination    sees Dr. Carren Rang    Hyperlipidemia    Osteopenia    Past Surgical History:  Procedure Laterality Date   COLONOSCOPY  12/30/2015   per Dr. Henrene Pastor, benign  polyps, repeat in 5  yrs.    ESOPHAGOGASTRODUODENOSCOPY  06/30/2012   per Dr. Sharlett Iles, reflux but no Barretts seen    HARDWARE REMOVAL Right 02/01/2020   Procedure: HARDWARE REMOVAL;  Surgeon: Mcarthur Rossetti, MD;  Location: [facility];  Service: Orthopedics;  Laterality: Right;   INTRAMEDULLARY (IM) NAIL INTERTROCHANTERIC Right 06/22/2019   INTRAMEDULLARY (IM) NAIL INTERTROCHANTERIC Right 06/22/2019   Procedure: INTRAMEDULLARY  (IM) NAIL INTERTROCHANTRIC;  Surgeon: Altamese Bejou, MD;  Location: [facility];  Service: Orthopedics;  Laterality: Right;   MASTECTOMY W/ SENTINEL NODE BIOPSY Right 09/20/2022   Procedure: RIGHT MASTECTOMY WITH SENTINEL LYMPH NODE BIOPSY;  Surgeon: Jovita Kussmaul, MD;  Location: [facility];  Service: General;  Laterality: Right;   POLYPECTOMY     REVERSE SHOULDER ARTHROPLASTY Right 01/29/2020   Procedure: RIGHT REVERSE SHOULDER ARTHROPLASTY;  Surgeon: Marchia Bond, MD;  Location: [facility];  Service: Orthopedics;  Laterality: Right;   TOTAL HIP ARTHROPLASTY Right 02/01/2020   Procedure: TOTAL HIP ARTHROPLASTY ANTERIOR APPROACH;  Surgeon: Mcarthur Rossetti, MD;  Location: [facility];  Service: Orthopedics;  Laterality: Right;   Patient Active Problem List   Diagnosis Date Noted   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   Generalized anxiety disorder 01/13/2021   Fall 04/30/2020   Osteoarthritis, multiple sites 04/30/2020   Pneumonia 03/11/2020   Muscle spasm 03/10/2020   Right flank pain 03/10/2020   Right shoulder pain 02/08/2020   Right hip pain 02/08/2020   Blood loss anemia 02/08/2020   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, right  Retained orthopedic hardware    Closed fracture of right proximal humerus 01/29/2020   Closed right hip fracture, initial encounter ([place]) 06/22/2019   Environmental and seasonal allergies 06/12/2018   Dyslipidemia 08/26/2017   IBS (irritable bowel syndrome) 08/26/2017   Angio-edema 11/03/2016   Diverticulosis of colon without hemorrhage 09/30/2015   Depression, recurrent ([place]) 08/22/2007   GERD 07/04/2007    ONSET DATE: 2 years ago  REFERRING DIAG: Z91.81 (ICD-10-CM) - At risk for falls  THERAPY DIAG:  Muscle weakness (generalized)  Unsteadiness on feet  Other abnormalities of gait and mobility  Repeated  falls  Rationale for Evaluation and Treatment: Rehabilitation  SUBJECTIVE:                                                                                                                                                                                             SUBJECTIVE STATEMENT: Had a fall on Sunday when the R knee gave out. Denies injury. Called the paramedics to help her up. Denies questions on HEP- has her aid assist. Aid reports that the patient has fallen 3 times in the past week and increased fatigue after HEP. Struggling with allergies right now.   Pt accompanied by: self, aid-Angela    PERTINENT HISTORY: Anxiety, R breast CA s/p mastectomy 09/20/22, COPD, depression, GERD, HLD, osteopenia, R femur IM nailing 2020, R reverse TSA 2021, R THA 2021  PAIN:  Are you having pain? Yes: NPRS scale: 4-5/10 Pain location: R breast Pain description: throbbing Aggravating factors: spontaneous Relieving factors: nothing  PRECAUTIONS: Fall and Other: R mastectomy- no BP; Per MD note 10/12/22 "The patient can start stretching the right arm". Currently being seen an [facility] for CA rehab.  WEIGHT BEARING RESTRICTIONS: No  FALLS: Has patient fallen in last 6 months? Yes. Number of falls 2-3x/week  LIVING ENVIRONMENT: Lives with: lives alone Lives in: House/apartment Stairs:  4 steps to enter with 1 handrail; 2 story home- lives on 1st floor Has following equipment at home: Gilford Rile - 2 wheeled, Environmental consultant - 4 wheeled, Wheelchair (manual), [employer], and Grab bars  PLOF: Independent  PATIENT GOALS: improve balance  OBJECTIVE:     TODAY'S TREATMENT: 11/02/22 Activity Comments  Nustep L4 x 4 min Ues/Les    Gait training nustep to mat 5-10 ft R foot dragging; shuffling steps; cueing for safe hand/foot placement with transfers   mini squat with walker support 10x Demo required for proper form  Standing heel raises with walker support 2x10 Limited ROM  March with walker support  2x20 Small amplitude movement on R requiring cues and frequent reminders  Romberg in walker  1 min Hesitancy and fear of falling, especially without UE support   Feet apart EC balance 1 min   Standing fwd/back wt shift with walker support More hesitancy in backwards direction   Gait training with RW 61f Short step length R>L, discontinuous stepping and intermittently stopping d/t hesitation; PT followed with the w/c  for safety            HOME EXERCISE PROGRAM Last updated: 11/02/22 Access Code: [email or web address] URL: [email or web address] Date: 11/02/2022 Prepared by: [facility]  Exercises - Sit to Stand  - 1 x daily - 7 x weekly - 1-2 sets - 5-10 reps - Heel Raises with Counter Support  - 1 x daily - 7 x weekly - 1-2 sets - 10 reps - Standing Gastroc Stretch at Counter  - 1 x daily - 7 x weekly - 2 sets - 30 sec hold - Side Stepping with Counter Support  - 1 x daily - 7 x weekly - 1-2 sets - 10 reps - Alternating Step Taps with Counter Support  - 1 x daily - 7 x weekly - 1-2 sets - 10 sec hold - Seated Long Arc Quad  - 1 x daily - 5 x weekly - 2 sets - 10 reps - Seated March  - 1 x daily - 5 x weekly - 2 sets - 10 reps - Seated Heel Raise  - 1 x daily - 5 x weekly - 2 sets - 10 reps    PATIENT EDUCATION: Education details: review/update and discussion on HEP for improved tolerance; edu on proper hydration  Person educated: Patient and CPsychologist, educationalEducation method: Explanation, Demonstration, Tactile cues, Verbal cues, and Handouts Education comprehension: verbalized understanding and returned demonstration    Below measures were taken at time of initial evaluation unless otherwise specified:   DIAGNOSTIC FINDINGS: none recent  COGNITION: Overall cognitive status: Within functional limits for tasks assessed   SENSATION: Reports occasional R foot decreased sensation   POSTURE: rounded shoulders  LOWER EXTREMITY ROM:      Active  Right Eval Left Eval  Hip flexion    Hip extension    Hip abduction    Hip adduction    Hip internal rotation    Hip external rotation    Knee flexion    Knee extension    Ankle dorsiflexion 10 5  Ankle plantarflexion    Ankle inversion    Ankle eversion     (Blank rows = not tested)  LOWER EXTREMITY MMT:    MMT (in sitting) Right Eval Left Eval  Hip flexion 4+ 4  Hip extension    Hip abduction 4 4+  Hip adduction 4+ 4+  Hip internal rotation    Hip external rotation    Knee flexion 4- 4  Knee extension 4- 4-  Ankle dorsiflexion 5 4  Ankle plantarflexion 4 3+  Ankle inversion    Ankle eversion    (Blank rows = not tested)    GAIT: Gait pattern:  B short step length with poor foot clearance; difficulty maneuvering walker and turning safely Assistive device utilized: Walker - 2 wheeled Level of assistance: CGA  FUNCTIONAL TESTS:     HEP:  Access Code: [email or web address] URL: [email or web address] Date: 10/25/2022 Prepared by: KSherlyn Lees Exercises - Sit to Stand  - 1 x daily - 7 x weekly - 3 sets - 10 reps - Heel Raises with Counter Support  - 1 x daily - 7  x weekly - 3 sets - 10 reps - Standing Gastroc Stretch at Counter  - 1 x daily - 7 x weekly - 3 sets - 30 sec hold - Side Stepping with Counter Support  - 1 x daily - 7 x weekly - 3 sets - 10 reps - Alternating Step Taps with Counter Support  - 1 x daily - 7 x weekly - 3-5 sets - 10 sec hold   PATIENT EDUCATION: Education details: prognosis, POC, HEP (in patient instructions) Person educated: Patient Education method: Explanation, Demonstration, Tactile cues, Verbal cues, and Handouts Education comprehension: verbalized understanding and returned demonstration    GOALS: Goals reviewed with patient? Yes  SHORT TERM GOALS: Target date: 11/11/2022  Patient to be independent with initial HEP. Baseline: HEP initiated Goal status: IN PROGRESS    LONG TERM GOALS: Target date:  12/16/2022  Patient to be independent with advanced HEP. Baseline: Not yet initiated  Goal status: IN PROGRESS  Patient to demonstrate B LE strength >/=4+/5.  Baseline: See above Goal status: IN PROGRESS  Patient to demonstrate B ankle dorsiflexion AROM to 10 degrees. Baseline: 10, 5 deg Goal status: IN PROGRESS  Patient to complete TUG in <25 sec with LRAD in order to decrease risk of falls.   Baseline: 1 min 7 sec Goal status: IN PROGRESS  Patient to demonstrate 5xSTS test in <15 sec in order to decrease risk of falls.  Baseline: 22.6 sec with B armrests Goal status: IN PROGRESS  Patient to score at least 40/56 on Berg in order to decrease risk of falls.  Baseline: NT Goal status: IN PROGRESS  Patient to complete 334f of gait on level surface with LRAD in order to improve access to home/community. Baseline: unable Goal status: INITIAL   ASSESSMENT:  CLINICAL IMPRESSION: Patient arrived to session with report of a fall occurring on Sunday d/t the R knee buckling. Denies injury but reports that she had to have paramedics help her from the floor. Upon speaking to patient's aid, she reports 3 falls in the past week and increased fatigue after performing HEP. Thus, reviewed and adjusted home program in order to allow for better tolerance. Worked on standing strengthening activities with use of RW for support. Patient with difficulty raising R LE with activities today. This was also evident with gait training; patient limited by weakness and hesitancy d/t report of fear of falling.  Upon observation, R ankle mildly edematous- patient reports that this is chronic for several years. Overall patient tolerated session well today and without complaints upon leaving.    OBJECTIVE IMPAIRMENTS: Abnormal gait, decreased activity tolerance, decreased balance, decreased endurance, decreased mobility, difficulty walking, decreased ROM, decreased strength, decreased safety awareness, dizziness,  impaired flexibility, improper body mechanics, postural dysfunction, and pain.   ACTIVITY LIMITATIONS: carrying, lifting, bending, sitting, standing, squatting, sleeping, stairs, transfers, bed mobility, bathing, toileting, dressing, reach over head, hygiene/grooming, locomotion level, and caring for others  PARTICIPATION LIMITATIONS: meal prep, cleaning, laundry, shopping, community activity, and church  PERSONAL FACTORS: Age, Fitness, Past/current experiences, Time since onset of injury/illness/exacerbation, and 3+ comorbidities: Anxiety, R breast CA s/p mastectomy 09/20/22, COPD, depression, GERD, HLD, osteopenia, R femur IM nailing 2020, R reverse TSA 2021, R THA 2021  are also affecting patient's functional outcome.   REHAB POTENTIAL: Good  CLINICAL DECISION MAKING: Evolving/moderate complexity  EVALUATION COMPLEXITY: Moderate  PLAN:  PT FREQUENCY: 2x/week  PT DURATION: 8 weeks  PLANNED INTERVENTIONS: Therapeutic exercises, Therapeutic activity, Neuromuscular re-education, Balance training, Gait training, Patient/Family education,  Self Care, Joint mobilization, Joint manipulation, Stair training, Vestibular training, Canalith repositioning, DME instructions, Aquatic Therapy, Dry Needling, Cryotherapy, Moist heat, Taping, Manual therapy, and Re-evaluation  PLAN FOR NEXT SESSION: reassess HEP, continue working on STS, gait training with RW, LE strengthening    Janene Harvey, PT, DPT 11/02/22 11:47 AM  [facility] [address], [place], [place] [address] Phone # [phone] Fax # [phone]

## 2022-11-02 ENCOUNTER — Ambulatory Visit: Payer: Medicare Other | Admitting: Physical Therapy

## 2022-11-02 ENCOUNTER — Encounter: Payer: Self-pay | Admitting: Physical Therapy

## 2022-11-02 DIAGNOSIS — Z483 Aftercare following surgery for neoplasm: Secondary | ICD-10-CM | POA: Diagnosis not present

## 2022-11-02 DIAGNOSIS — M6281 Muscle weakness (generalized): Secondary | ICD-10-CM

## 2022-11-02 DIAGNOSIS — R296 Repeated falls: Secondary | ICD-10-CM

## 2022-11-02 DIAGNOSIS — M25611 Stiffness of right shoulder, not elsewhere classified: Secondary | ICD-10-CM | POA: Diagnosis not present

## 2022-11-02 DIAGNOSIS — R293 Abnormal posture: Secondary | ICD-10-CM | POA: Diagnosis not present

## 2022-11-02 DIAGNOSIS — R2689 Other abnormalities of gait and mobility: Secondary | ICD-10-CM

## 2022-11-02 DIAGNOSIS — R2681 Unsteadiness on feet: Secondary | ICD-10-CM

## 2022-11-03 NOTE — Therapy (Signed)
OUTPATIENT PHYSICAL THERAPY NEURO TREATMENT   Patient Name: Emily Horton MRN: [id number] DOB:1945-12-24, 76 y.o., female Today's Date: 11/03/2022   PCP: Laurey Morale, MD  REFERRING PROVIDER: Jovita Kussmaul, MD      Past Medical History:  Diagnosis Date   Allergy    SEASONAL   Anxiety    Arthritis    right hip, right shoulder   Blood transfusion without reported diagnosis    "when had hip surgery"   Cancer [facility] 07/2022   right breast papillary carcinoma in situ   Colon polyps 09/25/2007   Colonoscopy   COPD (chronic obstructive pulmonary disease) ([place])    smoker 1/2ppd   Depression    Diverticulosis of colon (without mention of hemorrhage) 09/25/2007   Colonoscopy   GERD (gastroesophageal reflux disease)    Gynecological examination    sees Dr. Carren Rang    Hyperlipidemia    Osteopenia    Past Surgical History:  Procedure Laterality Date   COLONOSCOPY  12/30/2015   per Dr. Henrene Pastor, benign  polyps, repeat in 5  yrs.    ESOPHAGOGASTRODUODENOSCOPY  06/30/2012   per Dr. Sharlett Iles, reflux but no Barretts seen    HARDWARE REMOVAL Right 02/01/2020   Procedure: HARDWARE REMOVAL;  Surgeon: Mcarthur Rossetti, MD;  [facility]: [facility];  Service: Orthopedics;  Laterality: Right;   INTRAMEDULLARY (IM) NAIL INTERTROCHANTERIC Right 06/22/2019   INTRAMEDULLARY (IM) NAIL INTERTROCHANTERIC Right 06/22/2019   Procedure: INTRAMEDULLARY (IM) NAIL INTERTROCHANTRIC;  Surgeon: Altamese Cedar Hill, MD;  Location: [place];  Service: Orthopedics;  Laterality: Right;   MASTECTOMY W/ SENTINEL NODE BIOPSY Right 09/20/2022   Procedure: RIGHT MASTECTOMY WITH SENTINEL LYMPH NODE BIOPSY;  Surgeon: Jovita Kussmaul, MD;  Location: [place];  Service: General;  Laterality: Right;   POLYPECTOMY     REVERSE SHOULDER ARTHROPLASTY Right 01/29/2020   Procedure: RIGHT REVERSE SHOULDER ARTHROPLASTY;  Surgeon: Marchia Bond, MD;  [facility];  Service: Orthopedics;  Laterality: Right;   TOTAL  HIP ARTHROPLASTY Right 02/01/2020   Procedure: TOTAL HIP ARTHROPLASTY ANTERIOR APPROACH;  Surgeon: Mcarthur Rossetti, MD;  [facility];  Service: Orthopedics;  Laterality: Right;   Patient Active Problem List   Diagnosis Date Noted   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   Generalized anxiety disorder 01/13/2021   Fall 04/30/2020   Osteoarthritis, multiple sites 04/30/2020   Pneumonia 03/11/2020   Muscle spasm 03/10/2020   Right flank pain 03/10/2020   Right shoulder pain 02/08/2020   Right hip pain 02/08/2020   Blood loss anemia 02/08/2020   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  Rationale for Evaluation and Treatment: Rehabilitation  SUBJECTIVE:  SUBJECTIVE STATEMENT: Had a fall on Sunday when the R knee gave out. Denies injury. Called the paramedics to help her up. Denies questions on HEP- has her aid assist. Aid reports that the patient has fallen 3 times in the past week and increased fatigue after HEP. Struggling with allergies right now.   Pt accompanied by: self, aid-Angela    PERTINENT HISTORY: Anxiety, R breast CA s/p  mastectomy 09/20/22, COPD, depression, GERD, HLD, osteopenia, R femur IM nailing 2020, R reverse TSA 2021, R THA 2021  PAIN:  Are you having pain? Yes: NPRS scale: 4-5/10 Pain location: R breast Pain description: throbbing Aggravating factors: spontaneous Relieving factors: nothing  PRECAUTIONS: Fall and Other: R mastectomy- no BP; Per MD note 10/12/22 "The patient can start stretching the right arm". Currently being seen an [facility] for CA rehab.  WEIGHT BEARING RESTRICTIONS: No  FALLS: Has patient fallen in last 6 months? Yes. Number of falls 2-3x/week  LIVING ENVIRONMENT: Lives with: lives alone Lives in: House/apartment Stairs:  4 steps to enter with 1 handrail; 2 story home- lives on 1st floor Has following equipment at home: Gilford Rile - 2 wheeled, Environmental consultant - 4 wheeled, Wheelchair (manual), [employer], and Grab bars  PLOF: Independent  PATIENT GOALS: improve balance  OBJECTIVE:     TODAY'S TREATMENT: 11/04/22 Activity Comments                          HOME EXERCISE PROGRAM Last updated: 11/02/22 Access Code: [email or web address] URL: [email or web address] Date: 11/02/2022 Prepared by: [facility]  Exercises - Sit to Stand  - 1 x daily - 7 x weekly - 1-2 sets - 5-10 reps - Heel Raises with Counter Support  - 1 x daily - 7 x weekly - 1-2 sets - 10 reps - Standing Gastroc Stretch at Counter  - 1 x daily - 7 x weekly - 2 sets - 30 sec hold - Side Stepping with Counter Support  - 1 x daily - 7 x weekly - 1-2 sets - 10 reps - Alternating Step Taps with Counter Support  - 1 x daily - 7 x weekly - 1-2 sets - 10 sec hold - Seated Long Arc Quad  - 1 x daily - 5 x weekly - 2 sets - 10 reps - Seated March  - 1 x daily - 5 x weekly - 2 sets - 10 reps - Seated Heel Raise  - 1 x daily - 5 x weekly - 2 sets - 10 reps      Below measures were taken at time of initial evaluation unless otherwise specified:   DIAGNOSTIC  FINDINGS: none recent  COGNITION: Overall cognitive status: Within functional limits for tasks assessed   SENSATION: Reports occasional R foot decreased sensation   POSTURE: rounded shoulders  LOWER EXTREMITY ROM:     Active  Right Eval Left Eval  Hip flexion    Hip extension    Hip abduction    Hip adduction    Hip internal rotation    Hip external rotation    Knee flexion    Knee extension    Ankle dorsiflexion 10 5  Ankle plantarflexion    Ankle inversion    Ankle eversion     (Blank rows = not tested)  LOWER EXTREMITY MMT:    MMT (in sitting) Right Eval Left Eval  Hip flexion 4+ 4  Hip extension    Hip  abduction 4 4+  Hip adduction 4+ 4+  Hip internal rotation    Hip external rotation    Knee flexion 4- 4  Knee extension 4- 4-  Ankle dorsiflexion 5 4  Ankle plantarflexion 4 3+  Ankle inversion    Ankle eversion    (Blank rows = not tested)    GAIT: Gait pattern:  B short step length with poor foot clearance; difficulty maneuvering walker and turning safely Assistive device utilized: Walker - 2 wheeled Level of assistance: CGA  FUNCTIONAL TESTS:     HEP:  Access Code: [email or web address] URL: [email or web address] Date: 10/25/2022 Prepared by: Sherlyn Lees  Exercises - Sit to Stand  - 1 x daily - 7 x weekly - 3 sets - 10 reps - Heel Raises with Counter Support  - 1 x daily - 7 x weekly - 3 sets - 10 reps - Standing Gastroc Stretch at Counter  - 1 x daily - 7 x weekly - 3 sets - 30 sec hold - Side Stepping with Counter Support  - 1 x daily - 7 x weekly - 3 sets - 10 reps - Alternating Step Taps with Counter Support  - 1 x daily - 7 x weekly - 3-5 sets - 10 sec hold   PATIENT EDUCATION: Education details: prognosis, POC, HEP (in patient instructions) Person educated: Patient Education method: Explanation, Demonstration, Tactile cues, Verbal cues, and Handouts Education comprehension: verbalized understanding and returned  demonstration    GOALS: Goals reviewed with patient? Yes  SHORT TERM GOALS: Target date: 11/11/2022  Patient to be independent with initial HEP. Baseline: HEP initiated Goal status: IN PROGRESS    LONG TERM GOALS: Target date: 12/16/2022  Patient to be independent with advanced HEP. Baseline: Not yet initiated  Goal status: IN PROGRESS  Patient to demonstrate B LE strength >/=4+/5.  Baseline: See above Goal status: IN PROGRESS  Patient to demonstrate B ankle dorsiflexion AROM to 10 degrees. Baseline: 10, 5 deg Goal status: IN PROGRESS  Patient to complete TUG in <25 sec with LRAD in order to decrease risk of falls.   Baseline: 1 min 7 sec Goal status: IN PROGRESS  Patient to demonstrate 5xSTS test in <15 sec in order to decrease risk of falls.  Baseline: 22.6 sec with B armrests Goal status: IN PROGRESS  Patient to score at least 40/56 on Berg in order to decrease risk of falls.  Baseline: NT Goal status: IN PROGRESS  Patient to complete 330f of gait on level surface with LRAD in order to improve access to home/community. Baseline: unable Goal status: INITIAL   ASSESSMENT:  CLINICAL IMPRESSION: Patient arrived to session with report of a fall occurring on Sunday d/t the R knee buckling. Denies injury but reports that she had to have paramedics help her from the floor. Upon speaking to patient's aid, she reports 3 falls in the past week and increased fatigue after performing HEP. Thus, reviewed and adjusted home program in order to allow for better tolerance. Worked on standing strengthening activities with use of RW for support. Patient with difficulty raising R LE with activities today. This was also evident with gait training; patient limited by weakness and hesitancy d/t report of fear of falling.  Upon observation, R ankle mildly edematous- patient reports that this is chronic for several years. Overall patient tolerated session well today and without complaints  upon leaving.    OBJECTIVE IMPAIRMENTS: Abnormal gait, decreased activity tolerance, decreased balance, decreased endurance, decreased mobility, difficulty walking, decreased ROM,  decreased strength, decreased safety awareness, dizziness, impaired flexibility, improper body mechanics, postural dysfunction, and pain.   ACTIVITY LIMITATIONS: carrying, lifting, bending, sitting, standing, squatting, sleeping, stairs, transfers, bed mobility, bathing, toileting, dressing, reach over head, hygiene/grooming, locomotion level, and caring for others  PARTICIPATION LIMITATIONS: meal prep, cleaning, laundry, shopping, community activity, and church  PERSONAL FACTORS: Age, Fitness, Past/current experiences, Time since onset of injury/illness/exacerbation, and 3+ comorbidities: Anxiety, R breast CA s/p mastectomy 09/20/22, COPD, depression, GERD, HLD, osteopenia, R femur IM nailing 2020, R reverse TSA 2021, R THA 2021  are also affecting patient's functional outcome.   REHAB POTENTIAL: Good  CLINICAL DECISION MAKING: Evolving/moderate complexity  EVALUATION COMPLEXITY: Moderate  PLAN:  PT FREQUENCY: 2x/week  PT DURATION: 8 weeks  PLANNED INTERVENTIONS: Therapeutic exercises, Therapeutic activity, Neuromuscular re-education, Balance training, Gait training, Patient/Family education, Self Care, Joint mobilization, Joint manipulation, Stair training, Vestibular training, Canalith repositioning, DME instructions, Aquatic Therapy, Dry Needling, Cryotherapy, Moist heat, Taping, Manual therapy, and Re-evaluation  PLAN FOR NEXT SESSION: reassess HEP, continue working on STS, gait training with RW, LE strengthening    Janene Harvey, PT, DPT 11/03/22 8:40 AM  [place] [facility] [address] [place], [place] [address] Phone # [phone] Fax # [phone]

## 2022-11-04 ENCOUNTER — Encounter: Payer: Self-pay | Admitting: Physical Therapy

## 2022-11-04 ENCOUNTER — Ambulatory Visit: Payer: Medicare Other | Admitting: Physical Therapy

## 2022-11-04 DIAGNOSIS — R2681 Unsteadiness on feet: Secondary | ICD-10-CM

## 2022-11-04 DIAGNOSIS — R2689 Other abnormalities of gait and mobility: Secondary | ICD-10-CM

## 2022-11-04 DIAGNOSIS — M6281 Muscle weakness (generalized): Secondary | ICD-10-CM

## 2022-11-04 DIAGNOSIS — Z483 Aftercare following surgery for neoplasm: Secondary | ICD-10-CM | POA: Diagnosis not present

## 2022-11-04 DIAGNOSIS — M25611 Stiffness of right shoulder, not elsewhere classified: Secondary | ICD-10-CM | POA: Diagnosis not present

## 2022-11-04 DIAGNOSIS — R293 Abnormal posture: Secondary | ICD-10-CM | POA: Diagnosis not present

## 2022-11-04 DIAGNOSIS — R296 Repeated falls: Secondary | ICD-10-CM

## 2022-11-04 DIAGNOSIS — R262 Difficulty in walking, not elsewhere classified: Secondary | ICD-10-CM

## 2022-11-09 ENCOUNTER — Ambulatory Visit: Payer: Medicare Other | Attending: General Surgery | Admitting: Physical Therapy

## 2022-11-09 ENCOUNTER — Encounter: Payer: Self-pay | Admitting: Physical Therapy

## 2022-11-09 ENCOUNTER — Ambulatory Visit: Payer: Medicare Other | Admitting: Physical Therapy

## 2022-11-09 DIAGNOSIS — Z483 Aftercare following surgery for neoplasm: Secondary | ICD-10-CM | POA: Diagnosis not present

## 2022-11-09 DIAGNOSIS — R293 Abnormal posture: Secondary | ICD-10-CM | POA: Insufficient documentation

## 2022-11-09 DIAGNOSIS — M25611 Stiffness of right shoulder, not elsewhere classified: Secondary | ICD-10-CM | POA: Diagnosis not present

## 2022-11-09 DIAGNOSIS — D0581 Other specified type of carcinoma in situ of right breast: Secondary | ICD-10-CM | POA: Insufficient documentation

## 2022-11-09 NOTE — Therapy (Signed)
OUTPATIENT PHYSICAL THERAPY TREATMENT   Patient Name: Emily Horton MRN: [id number] DOB:07/01/1946, 76 y.o., female Today's Date: 11/09/2022   PT End of Session - 11/09/22 1408     Visit Number 3    Number of Visits 3    Date for PT Re-Evaluation 11/08/22    PT Start Time 1405    PT Stop Time 1449    PT Time Calculation (min) 44 min    Activity Tolerance Patient tolerated treatment well    Behavior During Therapy WFL for tasks assessed/performed             Past Medical History:  Diagnosis Date   Allergy    SEASONAL   Anxiety    Arthritis    right hip, right shoulder   Blood transfusion without reported diagnosis    "when had hip surgery"   Cancer ([facility]) 07/2022   right breast papillary carcinoma in situ   Colon polyps 09/25/2007   Colonoscopy   COPD (chronic obstructive pulmonary disease) ([facility])    smoker 1/2ppd   Depression    Diverticulosis of colon (without mention of hemorrhage) 09/25/2007   Colonoscopy   GERD (gastroesophageal reflux disease)    Gynecological examination    sees Dr. Mezer    Hyperlipidemia    Osteopenia    Past Surgical History:  Procedure Laterality Date   COLONOSCOPY  12/30/2015   per Dr. Perry, benign  polyps, repeat in 5  yrs.    ESOPHAGOGASTRODUODENOSCOPY  06/30/2012   per Dr. Patterson, reflux but no Barretts seen    HARDWARE REMOVAL Right 02/01/2020   Procedure: HARDWARE REMOVAL;  Surgeon: Blackman, Christopher Y, MD;  [facility];  Service: Orthopedics;  Laterality: Right;   INTRAMEDULLARY (IM) NAIL INTERTROCHANTERIC Right 06/22/2019   INTRAMEDULLARY (IM) NAIL INTERTROCHANTERIC Right 06/22/2019   Procedure: INTRAMEDULLARY (IM) NAIL INTERTROCHANTRIC;  Surgeon: Handy, Michael, MD;  [facility];  Service: Orthopedics;  Laterality: Right;   MASTECTOMY W/ SENTINEL NODE BIOPSY Right 09/20/2022   Procedure: RIGHT MASTECTOMY WITH SENTINEL LYMPH NODE BIOPSY;  Surgeon: Toth, Paul III, MD;  Location: [facility];   Service: General;  Laterality: Right;   POLYPECTOMY     REVERSE SHOULDER ARTHROPLASTY Right 01/29/2020   Procedure: RIGHT REVERSE SHOULDER ARTHROPLASTY;  Surgeon: Landau, Joshua, MD;  [facility];  Service: Orthopedics;  Laterality: Right;   TOTAL HIP ARTHROPLASTY Right 02/01/2020   Procedure: TOTAL HIP ARTHROPLASTY ANTERIOR APPROACH;  Surgeon: Blackman, Christopher Y, MD;  [facility];  Service: Orthopedics;  Laterality: Right;   Patient Active Problem List   Diagnosis Date Noted   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   Generalized anxiety disorder 01/13/2021   Fall 04/30/2020   Osteoarthritis, multiple sites 04/30/2020   Pneumonia 03/11/2020   Muscle spasm 03/10/2020   Right flank pain 03/10/2020   Right shoulder pain 02/08/2020   Right hip pain 02/08/2020   Blood loss anemia 02/08/2020   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, right    Retained orthopedic hardware    Closed fracture of right proximal humerus 01/29/2020   Closed right hip fracture, initial encounter ([facility]) 06/22/2019   Environmental and seasonal allergies 06/12/2018   Dyslipidemia 08/26/2017   IBS (irritable bowel syndrome) 08/26/2017   Angio-edema 11/03/2016   Diverticulosis of colon without hemorrhage 09/30/2015   Depression, recurrent ([facility]) 08/22/2007   GERD 07/04/2007    PCP:   Stephen A Fry MD  REFERRING PROVIDER: Paul Toth III, MD  REFERRING DIAG: D05.81 (ICD-10-CM) - 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 She was going to outpatient neuro rehab but was discharged recently because she was still falling and her therapist felt it would be best if she received home health.   PERTINENT HISTORY:  Patient was diagnosed on 08/02/22 with right papillary neoplasm with features suggestive of low grade papillar carcinoma in situ in both the right breast biopsies. Prognostic showed strong positivity in ER and PR at both sites. 09/20/22- R mastectomy with SLNB 0/3   PATIENT GOALS   reduce lymphedema risk and learn post op HEP.    PAIN:  Are you having pain? No     PRECAUTIONS: Active CA Other: R THA, R shoulder replacement, had R THA revision at the same time as R shoulder replacement Feb 2021  PATIENT GOALS:  Reassess how my recovery is going related to arm function, pain, and swelling.  PAIN:  Are you having pain? No  PRECAUTIONS: Recent Surgery, right UE Lymphedema risk, Fall  ACTIVITY LEVEL / LEISURE: currently not exercising   OBJECTIVE:   PATIENT SURVEYS:  QUICK DASH:     OBSERVATIONS: Drain still in place, healing mastectomy scar with glue still intact; 11/09/22- drain removed, well healing mastectomy scar with glue still in place   POSTURE:  Forward head and rounded shoulders posture   UPPER EXTREMITY AROM/PROM:   A/PROM RIGHT   eval   RIGHT 10/11/22 RIGHT 11/09/22  Shoulder extension 59 56 65  Shoulder flexion 131 110 110  Shoulder abduction 98 92 93  Shoulder internal rotation 40 54   Shoulder external rotation 47 17                           (Blank rows = not tested)   A/PROM LEFT   eval  Shoulder extension 40  Shoulder flexion 157  Shoulder abduction 174  Shoulder internal rotation 45  Shoulder external rotation 70                          (  Blank rows = not tested)     CERVICAL AROM: All within normal limits:       Percent limited  Flexion WFL  Extension 25% limited  Right lateral flexion WFL  Left lateral flexion WFL  Right rotation 25% limited  Left rotation 25% limited        UPPER EXTREMITY STRENGTH: 3+/5 in available range     LYMPHEDEMA ASSESSMENTS:    LANDMARK RIGHT   eval RIGHT 10/11/22  10 cm proximal to olecranon process 32.5 33.1  Olecranon process 28 27  10 cm proximal to ulnar styloid process 24.7 23  Just proximal to ulnar styloid process 17.8 16.6  Across hand at thumb web space 19.6 19.5  At base of 2nd digit 6.5 6  (Blank rows = not tested)   LANDMARK LEFT   eval  10 cm proximal to olecranon process 33.8  Olecranon process 27.6  10 cm proximal to ulnar styloid process 24  Just proximal to ulnar styloid process 16.8  Across hand at thumb web space 20  At base of 2nd digit 6.2  (Blank rows = not tested)      Surgery type/Date: R mastectomy and SLNB 09/20/22 Number of lymph nodes removed: 0/3 Current/past treatment (chemo, radiation, hormone therapy): still waiting to find out if she needs chemo or radiation Other symptoms:  Heaviness/tightness Yes Pain No Pitting edema No Infections No Decreased scar mobility Yes Stemmer sign No TREATMENT: 11/09/22: Instructed pt in post op exercises and supine dowel flexion and abduction. Educated pt about commercially available AFO since pt has been having foot drop with recent falls. See assessment.  PATIENT EDUCATION:  Education details: post op breast exercises, supine dowel flexion and abduction with dowel, need for follow up with neurosurgeon due to recent worsening of R LE weakness and foot drop with continued falls  Person educated: Patient Education method: Explanation Education comprehension: verbalized understanding  HOME EXERCISE PROGRAM: Reviewed previously given post op HEP.   ASSESSMENT:  CLINICAL IMPRESSION: Pt returns to PT after having drains removed. She still has limited R shoulder ROM but is  unable to attend appointments here due to high fall risk and recent worsening of recent R LE weakness with drop foot. She was attending outpatient PT at neurorehab but had to stop due to increased falls. Her therapist recommended home health PT which pt has not been set up with yet. Educated daughter to contact PCP to follow up with this. Pt today had difficulty standing for ROM measurements and had to sit after just a couple of minutes due to weakness. Educated pt that they should follow up with the neurosurgeon since this has been worsening. Pt verbalized understanding. Issued HEP for pt to do at home to improve R shoulder ROM.  Pt also very tearful about possible move to assisted living soon. Pt's daughter is concerned about pt's safety since she has been having repeated falls. Will send an email to the social worker at the cancer center to see if there are any counseling options available to her to help.   PT treatment/interventions: ADL/Self care home management, Therapeutic exercises, Patient/Family education, Self Care, Joint mobilization, scar mobilization, and Re-evaluation   GOALS: Goals reviewed with patient? Yes  LONG TERM GOALS:  (STG=LTG)  GOALS Name Target Date  Goal status  1 Pt will demonstrate she has regained full shoulder ROM and function post operatively compared to baselines.  Baseline: 11/08/22 NOT MET 11/09/22 - pt at very high fall risk in community,   has had recent worsening of R LE weakness and foot drop. Priority being shifted towards this at this point in time.      PLAN:  PT FREQUENCY/DURATION: d/c  PLAN FOR NEXT SESSION: d/c    [facility]  [address], [address]  [place] [address]  [phone]  Blaire Breedlove Blue, PT 11/09/2022, 4:05 PM   PHYSICAL THERAPY DISCHARGE SUMMARY  Visits from Start of Care: 3  Current functional level related to goals / functional outcomes: See above    Remaining deficits: Limited R shoulder  ROM - pt being referred to HHPT   Education / Equipment: HEP   Patient agrees to discharge. Patient goals were not met. Patient is being discharged due to a change in medical status.  Blaire Breedlove Blue, PT 11/09/22 4:05 PM   

## 2022-11-10 ENCOUNTER — Telehealth: Payer: Self-pay | Admitting: Licensed Clinical Social Worker

## 2022-11-10 NOTE — Telephone Encounter (Signed)
[place] Work  Clinical Social Work was referred by  PT  for assessment of psychosocial needs.  Clinical Social Worker  contacted daughter, Leafy Ro,   to offer support and assess for needs.    Per PT & pt's daughter, due to pt's balance issues, they are looking into assisted living facilities which is very hard emotionally on pt. She is interested in counseling, especially a virtual option, to help process. CSW emailed names of local providers to pt's daughter per her request.  Otherwise, family is touring local facilities before they decide on an option for pt.     [place], [place] Worker [employer]

## 2022-11-11 ENCOUNTER — Ambulatory Visit: Payer: Medicare Other

## 2022-11-16 ENCOUNTER — Telehealth: Payer: Self-pay | Admitting: Family Medicine

## 2022-11-16 ENCOUNTER — Ambulatory Visit: Payer: Medicare Other | Admitting: Physical Therapy

## 2022-11-16 NOTE — Telephone Encounter (Signed)
Last VV-10/05/22

## 2022-11-16 NOTE — Telephone Encounter (Signed)
She will need to come into the office for an OV before I could issue this order because insurance requires a face to face encounter before they will cover this (she was last seen here in June)

## 2022-11-16 NOTE — Telephone Encounter (Signed)
Yes, for the time being a virtual visit is sufficient

## 2022-11-16 NOTE — Telephone Encounter (Signed)
Called and spoke with daughter Leafy Ro, informed her of message. Daughter asked if this visit could be a virtual visit , due to it's very difficult to get her mom to and from appointments, at this time because the patient not being able to walk, transportation issues, and daughter would have to arrange time to take off work.      Please advise

## 2022-11-16 NOTE — Telephone Encounter (Signed)
Pt is calling and she would like home physical therapy due to unable to walk for years. Pt said she is unable to go to physical therapy place due to transportation

## 2022-11-17 NOTE — Telephone Encounter (Signed)
Called daughter Leafy Ro, message given.   Will contact office to schedule virtual appointment for patient.

## 2022-11-18 ENCOUNTER — Ambulatory Visit: Payer: Medicare Other

## 2022-11-22 ENCOUNTER — Telehealth (INDEPENDENT_AMBULATORY_CARE_PROVIDER_SITE_OTHER): Payer: Medicare Other | Admitting: Family Medicine

## 2022-11-22 ENCOUNTER — Encounter: Payer: Self-pay | Admitting: Family Medicine

## 2022-11-22 DIAGNOSIS — R296 Repeated falls: Secondary | ICD-10-CM

## 2022-11-22 DIAGNOSIS — F331 Major depressive disorder, recurrent, moderate: Secondary | ICD-10-CM | POA: Diagnosis not present

## 2022-11-22 NOTE — Addendum Note (Signed)
Addended by: Wyvonne Lenz on: 11/22/2022 04:27 PM   Modules accepted: Orders

## 2022-11-22 NOTE — Progress Notes (Signed)
Subjective:    Patient ID: Emily Horton, female    DOB: [birth year], 76 y.o.   MRN: [id number]  HPI Virtual Visit via Video Note  I connected with the patient on 11/22/22 at  3:45 PM EST by a video enabled telemedicine application and verified that I am speaking with the correct person using two identifiers.  Location patient: home Location provider:work or home office Persons participating in the virtual visit: patient, provider  I discussed the limitations of evaluation and management by telemedicine and the availability of in person appointments. The patient expressed understanding and agreed to proceed.   HPI: Here to discuss therapy for her difficulty walking and her frequent falls. She has been going to outpatient PT, but it has gotten to the point that she finds it very difficult to leave her home. The therapist she has been working with ha suggested she start getting at home PT.    ROS: See pertinent positives and negatives per HPI.  Past Medical History:  Diagnosis Date   Allergy    SEASONAL   Anxiety    Arthritis    right hip, right shoulder   Blood transfusion without reported diagnosis    "when had hip surgery"   Cancer [facility]) 07/2022   right breast papillary carcinoma in situ   Colon polyps 09/25/2007   Colonoscopy   COPD (chronic obstructive pulmonary disease) ([place])    smoker 1/2ppd   Depression    Diverticulosis of colon (without mention of hemorrhage) 09/25/2007   Colonoscopy   GERD (gastroesophageal reflux disease)    Gynecological examination    sees Dr. Carren Rang    Hyperlipidemia    Osteopenia     Past Surgical History:  Procedure Laterality Date   COLONOSCOPY  12/30/2015   per Dr. Henrene Pastor, benign  polyps, repeat in 5  yrs.    ESOPHAGOGASTRODUODENOSCOPY  06/30/2012   per Dr. Sharlett Iles, reflux but no Barretts seen    HARDWARE REMOVAL Right 02/01/2020   Procedure: HARDWARE REMOVAL;  Surgeon: Mcarthur Rossetti, MD;  [facility]: [facility];  Service:  Orthopedics;  Laterality: Right;   INTRAMEDULLARY (IM) NAIL INTERTROCHANTERIC Right 06/22/2019   INTRAMEDULLARY (IM) NAIL INTERTROCHANTERIC Right 06/22/2019   Procedure: INTRAMEDULLARY (IM) NAIL INTERTROCHANTRIC;  Surgeon: Altamese , MD;  Location: [place];  Service: Orthopedics;  Laterality: Right;   MASTECTOMY W/ SENTINEL NODE BIOPSY Right 09/20/2022   Procedure: RIGHT MASTECTOMY WITH SENTINEL LYMPH NODE BIOPSY;  Surgeon: Jovita Kussmaul, MD;  Location: [place];  Service: General;  Laterality: Right;   POLYPECTOMY     REVERSE SHOULDER ARTHROPLASTY Right 01/29/2020   Procedure: RIGHT REVERSE SHOULDER ARTHROPLASTY;  Surgeon: Marchia Bond, MD;  [facility];  Service: Orthopedics;  Laterality: Right;   TOTAL HIP ARTHROPLASTY Right 02/01/2020   Procedure: TOTAL HIP ARTHROPLASTY ANTERIOR APPROACH;  Surgeon: Mcarthur Rossetti, MD;  [facility];  Service: Orthopedics;  Laterality: Right;    Family History  Problem Relation Age of Onset   Crohn's disease Father    Colon polyps Father    Breast cancer Maternal Grandmother    Stroke Paternal Grandfather    Colon cancer Neg Hx    Esophageal cancer Neg Hx    Rectal cancer Neg Hx    Stomach cancer Neg Hx      Current Outpatient Medications:    anastrozole (ARIMIDEX) 1 MG tablet, Take 1 tablet (1 mg total) by mouth daily., Disp: 90 tablet, Rfl: 3   buPROPion (WELLBUTRIN XL) 300  MG 24 hr tablet, TAKE 1 TABLET(300 MG) BY MOUTH EVERY MORNING, Disp: 90 tablet, Rfl: 3   busPIRone (BUSPAR) 15 MG tablet, TAKE 1 TABLET(15 MG) BY MOUTH TWICE DAILY, Disp: 60 tablet, Rfl: 1   cetirizine (ZYRTEC) 10 MG tablet, TAKE 1 TABLET(10 MG) BY MOUTH DAILY, Disp: 90 tablet, Rfl: 3   clonazePAM (KLONOPIN) 0.5 MG tablet, Take 1 tablet (0.5 mg total) by mouth 3 (three) times daily as needed for anxiety., Disp: 90 tablet, Rfl: 2   denosumab (PROLIA) 60 MG/ML SOSY injection, Inject 60 mg into the skin every 6 (six) months., Disp: , Rfl:     EPINEPHrine 0.3 mg/0.3 mL IJ SOAJ injection, Inject 0.3 mLs (0.3 mg total) into the muscle daily as needed for anaphylaxis., Disp: 1 each, Rfl: 0   meloxicam (MOBIC) 15 MG tablet, Take 1 tablet (15 mg total) by mouth daily. (Patient taking differently: Take 15 mg by mouth every other day.), Disp: 90 tablet, Rfl: 3   Menthol, Topical Analgesic, (BIOFREEZE) 4 % GEL, Apply topically. APPLY TO RIGHT SHOULDER Twice A Day - PRN, Disp: , Rfl:    methocarbamol (ROBAXIN) 500 MG tablet, Take 1 tablet (500 mg total) by mouth every 6 (six) hours as needed for muscle spasms., Disp: 30 tablet, Rfl: 1   omeprazole (PRILOSEC) 40 MG capsule, Take 1 capsule (40 mg total) by mouth 2 (two) times daily., Disp: 180 capsule, Rfl: 3   ondansetron (ZOFRAN) 4 MG tablet, Take 1 tablet (4 mg total) by mouth every 8 (eight) hours as needed for nausea or vomiting., Disp: 10 tablet, Rfl: 0   oxyCODONE (OXY IR/ROXICODONE) 5 MG immediate release tablet, Take 1-2 tablets (5-10 mg total) by mouth every 6 (six) hours as needed for moderate pain., Disp: 15 tablet, Rfl: 0   polyethylene glycol (MIRALAX / GLYCOLAX) 17 g packet, Take 17 g by mouth daily as needed for moderate constipation or severe constipation., Disp: 14 each, Rfl: 0   sennosides-docusate sodium (SENOKOT-S) 8.6-50 MG tablet, Take 2 tablets by mouth daily., Disp: , Rfl:    triamcinolone cream (KENALOG) 0.1 %, Apply 1 application topically 2 (two) times daily., Disp: 453.6 g, Rfl: 3  EXAM:  VITALS per patient if applicable:  GENERAL: alert, oriented, appears well and in no acute distress  HEENT: atraumatic, conjunttiva clear, no obvious abnormalities on inspection of external nose and ears  NECK: normal movements of the head and neck  LUNGS: on inspection no signs of respiratory distress, breathing rate appears normal, no obvious gross SOB, gasping or wheezing  CV: no obvious cyanosis  MS: moves all visible extremities without noticeable  abnormality  PSYCH/NEURO: pleasant and cooperative, no obvious depression or anxiety, speech and thought processing grossly intact  ASSESSMENT AND PLAN: Frequent falls. We will refer her to Home Health to set up at home PT. Alysia Penna, MD  Discussed the following assessment and plan:  Frequent falls     I discussed the assessment and treatment plan with the patient. The patient was provided an opportunity to ask questions and all were answered. The patient agreed with the plan and demonstrated an understanding of the instructions.   The patient was advised to call back or seek an in-person evaluation if the symptoms worsen or if the condition fails to improve as anticipated.      Review of Systems     Objective:   Physical Exam        Assessment & Plan:

## 2022-11-23 ENCOUNTER — Ambulatory Visit: Payer: Medicare Other

## 2022-11-25 ENCOUNTER — Ambulatory Visit: Payer: Medicare Other

## 2022-11-26 DIAGNOSIS — J449 Chronic obstructive pulmonary disease, unspecified: Secondary | ICD-10-CM | POA: Diagnosis not present

## 2022-11-26 DIAGNOSIS — Z9181 History of falling: Secondary | ICD-10-CM | POA: Diagnosis not present

## 2022-11-26 DIAGNOSIS — Z9011 Acquired absence of right breast and nipple: Secondary | ICD-10-CM | POA: Diagnosis not present

## 2022-11-26 DIAGNOSIS — M1611 Unilateral primary osteoarthritis, right hip: Secondary | ICD-10-CM | POA: Diagnosis not present

## 2022-11-26 DIAGNOSIS — F32A Depression, unspecified: Secondary | ICD-10-CM | POA: Diagnosis not present

## 2022-11-26 DIAGNOSIS — F419 Anxiety disorder, unspecified: Secondary | ICD-10-CM | POA: Diagnosis not present

## 2022-11-26 DIAGNOSIS — R296 Repeated falls: Secondary | ICD-10-CM | POA: Diagnosis not present

## 2022-11-26 DIAGNOSIS — F17210 Nicotine dependence, cigarettes, uncomplicated: Secondary | ICD-10-CM | POA: Diagnosis not present

## 2022-11-30 ENCOUNTER — Ambulatory Visit: Payer: Medicare Other

## 2022-11-30 ENCOUNTER — Telehealth: Payer: Self-pay | Admitting: Family Medicine

## 2022-11-30 NOTE — Telephone Encounter (Signed)
Please okay these orders  ?

## 2022-11-30 NOTE — Telephone Encounter (Signed)
[facility] - [phone]  Ok to leave a detailed message on this line  Pt Verbal Orders - [facility]  PT:  1x a wk for 2 wks 2x a wk for 3 wks 1x a wk for 2 wks  Focusing on: Gait, balance and strengthening

## 2022-12-01 NOTE — Telephone Encounter (Signed)
Ligi called back to FU on VO. Informed that MD approved order.

## 2022-12-01 NOTE — Telephone Encounter (Signed)
Noted  

## 2022-12-02 ENCOUNTER — Ambulatory Visit: Payer: Medicare Other | Admitting: Physical Therapy

## 2022-12-02 DIAGNOSIS — F419 Anxiety disorder, unspecified: Secondary | ICD-10-CM | POA: Diagnosis not present

## 2022-12-02 DIAGNOSIS — R296 Repeated falls: Secondary | ICD-10-CM | POA: Diagnosis not present

## 2022-12-02 DIAGNOSIS — J449 Chronic obstructive pulmonary disease, unspecified: Secondary | ICD-10-CM | POA: Diagnosis not present

## 2022-12-02 DIAGNOSIS — F32A Depression, unspecified: Secondary | ICD-10-CM | POA: Diagnosis not present

## 2022-12-02 DIAGNOSIS — F17210 Nicotine dependence, cigarettes, uncomplicated: Secondary | ICD-10-CM | POA: Diagnosis not present

## 2022-12-02 DIAGNOSIS — M1611 Unilateral primary osteoarthritis, right hip: Secondary | ICD-10-CM | POA: Diagnosis not present

## 2022-12-02 DIAGNOSIS — F331 Major depressive disorder, recurrent, moderate: Secondary | ICD-10-CM | POA: Diagnosis not present

## 2022-12-07 ENCOUNTER — Ambulatory Visit: Payer: Medicare Other | Admitting: Physical Therapy

## 2022-12-09 ENCOUNTER — Ambulatory Visit: Payer: Medicare Other | Admitting: Physical Therapy

## 2022-12-09 DIAGNOSIS — F331 Major depressive disorder, recurrent, moderate: Secondary | ICD-10-CM | POA: Diagnosis not present

## 2022-12-09 DIAGNOSIS — F419 Anxiety disorder, unspecified: Secondary | ICD-10-CM | POA: Diagnosis not present

## 2022-12-09 DIAGNOSIS — F17210 Nicotine dependence, cigarettes, uncomplicated: Secondary | ICD-10-CM | POA: Diagnosis not present

## 2022-12-09 DIAGNOSIS — J449 Chronic obstructive pulmonary disease, unspecified: Secondary | ICD-10-CM | POA: Diagnosis not present

## 2022-12-09 DIAGNOSIS — R296 Repeated falls: Secondary | ICD-10-CM | POA: Diagnosis not present

## 2022-12-09 DIAGNOSIS — F32A Depression, unspecified: Secondary | ICD-10-CM | POA: Diagnosis not present

## 2022-12-09 DIAGNOSIS — M1611 Unilateral primary osteoarthritis, right hip: Secondary | ICD-10-CM | POA: Diagnosis not present

## 2022-12-13 ENCOUNTER — Ambulatory Visit: Payer: Medicare Other | Attending: General Surgery

## 2022-12-13 DIAGNOSIS — J449 Chronic obstructive pulmonary disease, unspecified: Secondary | ICD-10-CM | POA: Diagnosis not present

## 2022-12-13 DIAGNOSIS — R295 Transient paralysis: Secondary | ICD-10-CM

## 2022-12-13 DIAGNOSIS — Z9181 History of falling: Secondary | ICD-10-CM

## 2022-12-13 DIAGNOSIS — M1611 Unilateral primary osteoarthritis, right hip: Secondary | ICD-10-CM | POA: Diagnosis not present

## 2022-12-13 DIAGNOSIS — Z483 Aftercare following surgery for neoplasm: Secondary | ICD-10-CM | POA: Insufficient documentation

## 2022-12-13 DIAGNOSIS — F17210 Nicotine dependence, cigarettes, uncomplicated: Secondary | ICD-10-CM

## 2022-12-13 DIAGNOSIS — F32A Depression, unspecified: Secondary | ICD-10-CM | POA: Diagnosis not present

## 2022-12-13 DIAGNOSIS — R293 Abnormal posture: Secondary | ICD-10-CM | POA: Insufficient documentation

## 2022-12-13 DIAGNOSIS — Z9011 Acquired absence of right breast and nipple: Secondary | ICD-10-CM

## 2022-12-13 DIAGNOSIS — D0581 Other specified type of carcinoma in situ of right breast: Secondary | ICD-10-CM | POA: Insufficient documentation

## 2022-12-13 DIAGNOSIS — F419 Anxiety disorder, unspecified: Secondary | ICD-10-CM | POA: Diagnosis not present

## 2022-12-13 DIAGNOSIS — M25611 Stiffness of right shoulder, not elsewhere classified: Secondary | ICD-10-CM | POA: Insufficient documentation

## 2022-12-14 ENCOUNTER — Ambulatory Visit: Payer: Medicare Other

## 2022-12-14 DIAGNOSIS — F32A Depression, unspecified: Secondary | ICD-10-CM | POA: Diagnosis not present

## 2022-12-14 DIAGNOSIS — J449 Chronic obstructive pulmonary disease, unspecified: Secondary | ICD-10-CM | POA: Diagnosis not present

## 2022-12-14 DIAGNOSIS — R296 Repeated falls: Secondary | ICD-10-CM | POA: Diagnosis not present

## 2022-12-14 DIAGNOSIS — F419 Anxiety disorder, unspecified: Secondary | ICD-10-CM | POA: Diagnosis not present

## 2022-12-14 DIAGNOSIS — F17210 Nicotine dependence, cigarettes, uncomplicated: Secondary | ICD-10-CM | POA: Diagnosis not present

## 2022-12-14 DIAGNOSIS — M1611 Unilateral primary osteoarthritis, right hip: Secondary | ICD-10-CM | POA: Diagnosis not present

## 2022-12-16 ENCOUNTER — Ambulatory Visit: Payer: Medicare Other

## 2022-12-16 DIAGNOSIS — F32A Depression, unspecified: Secondary | ICD-10-CM | POA: Diagnosis not present

## 2022-12-16 DIAGNOSIS — F331 Major depressive disorder, recurrent, moderate: Secondary | ICD-10-CM | POA: Diagnosis not present

## 2022-12-16 DIAGNOSIS — J449 Chronic obstructive pulmonary disease, unspecified: Secondary | ICD-10-CM | POA: Diagnosis not present

## 2022-12-16 DIAGNOSIS — R296 Repeated falls: Secondary | ICD-10-CM | POA: Diagnosis not present

## 2022-12-16 DIAGNOSIS — F17210 Nicotine dependence, cigarettes, uncomplicated: Secondary | ICD-10-CM | POA: Diagnosis not present

## 2022-12-16 DIAGNOSIS — F419 Anxiety disorder, unspecified: Secondary | ICD-10-CM | POA: Diagnosis not present

## 2022-12-16 DIAGNOSIS — M1611 Unilateral primary osteoarthritis, right hip: Secondary | ICD-10-CM | POA: Diagnosis not present

## 2022-12-20 DIAGNOSIS — R296 Repeated falls: Secondary | ICD-10-CM | POA: Diagnosis not present

## 2022-12-20 DIAGNOSIS — J449 Chronic obstructive pulmonary disease, unspecified: Secondary | ICD-10-CM | POA: Diagnosis not present

## 2022-12-20 DIAGNOSIS — M1611 Unilateral primary osteoarthritis, right hip: Secondary | ICD-10-CM | POA: Diagnosis not present

## 2022-12-20 DIAGNOSIS — F17210 Nicotine dependence, cigarettes, uncomplicated: Secondary | ICD-10-CM | POA: Diagnosis not present

## 2022-12-20 DIAGNOSIS — F419 Anxiety disorder, unspecified: Secondary | ICD-10-CM | POA: Diagnosis not present

## 2022-12-20 DIAGNOSIS — F32A Depression, unspecified: Secondary | ICD-10-CM | POA: Diagnosis not present

## 2022-12-22 DIAGNOSIS — J449 Chronic obstructive pulmonary disease, unspecified: Secondary | ICD-10-CM | POA: Diagnosis not present

## 2022-12-22 DIAGNOSIS — F419 Anxiety disorder, unspecified: Secondary | ICD-10-CM | POA: Diagnosis not present

## 2022-12-22 DIAGNOSIS — F32A Depression, unspecified: Secondary | ICD-10-CM | POA: Diagnosis not present

## 2022-12-22 DIAGNOSIS — F17210 Nicotine dependence, cigarettes, uncomplicated: Secondary | ICD-10-CM | POA: Diagnosis not present

## 2022-12-22 DIAGNOSIS — M1611 Unilateral primary osteoarthritis, right hip: Secondary | ICD-10-CM | POA: Diagnosis not present

## 2022-12-22 DIAGNOSIS — R296 Repeated falls: Secondary | ICD-10-CM | POA: Diagnosis not present

## 2022-12-23 DIAGNOSIS — F331 Major depressive disorder, recurrent, moderate: Secondary | ICD-10-CM | POA: Diagnosis not present

## 2022-12-26 DIAGNOSIS — Z9011 Acquired absence of right breast and nipple: Secondary | ICD-10-CM | POA: Diagnosis not present

## 2022-12-26 DIAGNOSIS — R296 Repeated falls: Secondary | ICD-10-CM | POA: Diagnosis not present

## 2022-12-26 DIAGNOSIS — F32A Depression, unspecified: Secondary | ICD-10-CM | POA: Diagnosis not present

## 2022-12-26 DIAGNOSIS — J449 Chronic obstructive pulmonary disease, unspecified: Secondary | ICD-10-CM | POA: Diagnosis not present

## 2022-12-26 DIAGNOSIS — F17210 Nicotine dependence, cigarettes, uncomplicated: Secondary | ICD-10-CM | POA: Diagnosis not present

## 2022-12-26 DIAGNOSIS — F419 Anxiety disorder, unspecified: Secondary | ICD-10-CM | POA: Diagnosis not present

## 2022-12-26 DIAGNOSIS — Z9181 History of falling: Secondary | ICD-10-CM | POA: Diagnosis not present

## 2022-12-26 DIAGNOSIS — M1611 Unilateral primary osteoarthritis, right hip: Secondary | ICD-10-CM | POA: Diagnosis not present

## 2022-12-27 DIAGNOSIS — J449 Chronic obstructive pulmonary disease, unspecified: Secondary | ICD-10-CM | POA: Diagnosis not present

## 2022-12-27 DIAGNOSIS — F32A Depression, unspecified: Secondary | ICD-10-CM | POA: Diagnosis not present

## 2022-12-27 DIAGNOSIS — R296 Repeated falls: Secondary | ICD-10-CM | POA: Diagnosis not present

## 2022-12-27 DIAGNOSIS — F419 Anxiety disorder, unspecified: Secondary | ICD-10-CM | POA: Diagnosis not present

## 2022-12-27 DIAGNOSIS — M1611 Unilateral primary osteoarthritis, right hip: Secondary | ICD-10-CM | POA: Diagnosis not present

## 2022-12-27 DIAGNOSIS — F17210 Nicotine dependence, cigarettes, uncomplicated: Secondary | ICD-10-CM | POA: Diagnosis not present

## 2022-12-29 ENCOUNTER — Telehealth: Payer: Self-pay | Admitting: Family Medicine

## 2022-12-29 NOTE — Telephone Encounter (Signed)
Please okay these orders  ?

## 2022-12-29 NOTE — Telephone Encounter (Signed)
  Emily Horton (PT) [facility] - [phone] [place] to leave a detailed message on this line   PT Verbal Order:   Recertification for continued PT

## 2022-12-30 ENCOUNTER — Other Ambulatory Visit: Payer: Self-pay | Admitting: Family Medicine

## 2022-12-30 DIAGNOSIS — F331 Major depressive disorder, recurrent, moderate: Secondary | ICD-10-CM | POA: Diagnosis not present

## 2022-12-30 NOTE — Telephone Encounter (Addendum)
[employer] verbal ok given.

## 2022-12-30 NOTE — Telephone Encounter (Signed)
Calling to check on progress of this request

## 2023-01-03 DIAGNOSIS — R296 Repeated falls: Secondary | ICD-10-CM | POA: Diagnosis not present

## 2023-01-03 DIAGNOSIS — J449 Chronic obstructive pulmonary disease, unspecified: Secondary | ICD-10-CM | POA: Diagnosis not present

## 2023-01-03 DIAGNOSIS — M1611 Unilateral primary osteoarthritis, right hip: Secondary | ICD-10-CM | POA: Diagnosis not present

## 2023-01-03 DIAGNOSIS — F419 Anxiety disorder, unspecified: Secondary | ICD-10-CM | POA: Diagnosis not present

## 2023-01-03 DIAGNOSIS — F32A Depression, unspecified: Secondary | ICD-10-CM | POA: Diagnosis not present

## 2023-01-03 DIAGNOSIS — F17210 Nicotine dependence, cigarettes, uncomplicated: Secondary | ICD-10-CM | POA: Diagnosis not present

## 2023-01-05 DIAGNOSIS — J449 Chronic obstructive pulmonary disease, unspecified: Secondary | ICD-10-CM | POA: Diagnosis not present

## 2023-01-05 DIAGNOSIS — M1611 Unilateral primary osteoarthritis, right hip: Secondary | ICD-10-CM | POA: Diagnosis not present

## 2023-01-05 DIAGNOSIS — F32A Depression, unspecified: Secondary | ICD-10-CM | POA: Diagnosis not present

## 2023-01-05 DIAGNOSIS — F419 Anxiety disorder, unspecified: Secondary | ICD-10-CM | POA: Diagnosis not present

## 2023-01-05 DIAGNOSIS — R296 Repeated falls: Secondary | ICD-10-CM | POA: Diagnosis not present

## 2023-01-05 DIAGNOSIS — F17210 Nicotine dependence, cigarettes, uncomplicated: Secondary | ICD-10-CM | POA: Diagnosis not present

## 2023-01-06 DIAGNOSIS — F331 Major depressive disorder, recurrent, moderate: Secondary | ICD-10-CM | POA: Diagnosis not present

## 2023-01-10 DIAGNOSIS — M1611 Unilateral primary osteoarthritis, right hip: Secondary | ICD-10-CM | POA: Diagnosis not present

## 2023-01-10 DIAGNOSIS — J449 Chronic obstructive pulmonary disease, unspecified: Secondary | ICD-10-CM | POA: Diagnosis not present

## 2023-01-10 DIAGNOSIS — R296 Repeated falls: Secondary | ICD-10-CM | POA: Diagnosis not present

## 2023-01-10 DIAGNOSIS — F32A Depression, unspecified: Secondary | ICD-10-CM | POA: Diagnosis not present

## 2023-01-10 DIAGNOSIS — F419 Anxiety disorder, unspecified: Secondary | ICD-10-CM | POA: Diagnosis not present

## 2023-01-10 DIAGNOSIS — F17210 Nicotine dependence, cigarettes, uncomplicated: Secondary | ICD-10-CM | POA: Diagnosis not present

## 2023-01-11 DIAGNOSIS — F331 Major depressive disorder, recurrent, moderate: Secondary | ICD-10-CM | POA: Diagnosis not present

## 2023-01-12 DIAGNOSIS — M1611 Unilateral primary osteoarthritis, right hip: Secondary | ICD-10-CM | POA: Diagnosis not present

## 2023-01-12 DIAGNOSIS — F419 Anxiety disorder, unspecified: Secondary | ICD-10-CM | POA: Diagnosis not present

## 2023-01-12 DIAGNOSIS — F32A Depression, unspecified: Secondary | ICD-10-CM | POA: Diagnosis not present

## 2023-01-12 DIAGNOSIS — R296 Repeated falls: Secondary | ICD-10-CM | POA: Diagnosis not present

## 2023-01-12 DIAGNOSIS — J449 Chronic obstructive pulmonary disease, unspecified: Secondary | ICD-10-CM | POA: Diagnosis not present

## 2023-01-12 DIAGNOSIS — F17210 Nicotine dependence, cigarettes, uncomplicated: Secondary | ICD-10-CM | POA: Diagnosis not present

## 2023-01-14 ENCOUNTER — Telehealth: Payer: Self-pay | Admitting: Family Medicine

## 2023-01-14 NOTE — Telephone Encounter (Signed)
Dionka with [facility] is calling to report pt had 2 falls one on 01-07-2023 and another one on 01-11-2023 with no injuries

## 2023-01-14 NOTE — Telephone Encounter (Signed)
Noted  

## 2023-01-20 ENCOUNTER — Telehealth: Payer: Self-pay | Admitting: Family Medicine

## 2023-01-20 DIAGNOSIS — F331 Major depressive disorder, recurrent, moderate: Secondary | ICD-10-CM | POA: Diagnosis not present

## 2023-01-20 DIAGNOSIS — F17210 Nicotine dependence, cigarettes, uncomplicated: Secondary | ICD-10-CM | POA: Diagnosis not present

## 2023-01-20 DIAGNOSIS — J449 Chronic obstructive pulmonary disease, unspecified: Secondary | ICD-10-CM | POA: Diagnosis not present

## 2023-01-20 DIAGNOSIS — R296 Repeated falls: Secondary | ICD-10-CM | POA: Diagnosis not present

## 2023-01-20 DIAGNOSIS — M1611 Unilateral primary osteoarthritis, right hip: Secondary | ICD-10-CM | POA: Diagnosis not present

## 2023-01-20 DIAGNOSIS — F419 Anxiety disorder, unspecified: Secondary | ICD-10-CM | POA: Diagnosis not present

## 2023-01-20 DIAGNOSIS — F32A Depression, unspecified: Secondary | ICD-10-CM | POA: Diagnosis not present

## 2023-01-20 NOTE — Telephone Encounter (Addendum)
Emily Horton (PT) [facility] -  [phone] (Ok to leave a detailed message on this line)   PT Verbal Order:  1 x a week for 4wks - Progressive gait balance training  Home exercise progression Safety fall precautions

## 2023-01-21 NOTE — Telephone Encounter (Signed)
Please okay these orders  ?

## 2023-01-25 DIAGNOSIS — J449 Chronic obstructive pulmonary disease, unspecified: Secondary | ICD-10-CM | POA: Diagnosis not present

## 2023-01-25 DIAGNOSIS — Z9011 Acquired absence of right breast and nipple: Secondary | ICD-10-CM | POA: Diagnosis not present

## 2023-01-25 DIAGNOSIS — F419 Anxiety disorder, unspecified: Secondary | ICD-10-CM | POA: Diagnosis not present

## 2023-01-25 DIAGNOSIS — Z9181 History of falling: Secondary | ICD-10-CM | POA: Diagnosis not present

## 2023-01-25 DIAGNOSIS — F17210 Nicotine dependence, cigarettes, uncomplicated: Secondary | ICD-10-CM | POA: Diagnosis not present

## 2023-01-25 DIAGNOSIS — M1611 Unilateral primary osteoarthritis, right hip: Secondary | ICD-10-CM | POA: Diagnosis not present

## 2023-01-25 DIAGNOSIS — F32A Depression, unspecified: Secondary | ICD-10-CM | POA: Diagnosis not present

## 2023-01-25 DIAGNOSIS — R296 Repeated falls: Secondary | ICD-10-CM | POA: Diagnosis not present

## 2023-01-25 NOTE — Telephone Encounter (Signed)
Verbal orders given to [facility] per confidential vm.

## 2023-01-27 DIAGNOSIS — F331 Major depressive disorder, recurrent, moderate: Secondary | ICD-10-CM | POA: Diagnosis not present

## 2023-02-01 DIAGNOSIS — R296 Repeated falls: Secondary | ICD-10-CM | POA: Diagnosis not present

## 2023-02-01 DIAGNOSIS — J449 Chronic obstructive pulmonary disease, unspecified: Secondary | ICD-10-CM | POA: Diagnosis not present

## 2023-02-01 DIAGNOSIS — F32A Depression, unspecified: Secondary | ICD-10-CM | POA: Diagnosis not present

## 2023-02-01 DIAGNOSIS — F17210 Nicotine dependence, cigarettes, uncomplicated: Secondary | ICD-10-CM | POA: Diagnosis not present

## 2023-02-01 DIAGNOSIS — F419 Anxiety disorder, unspecified: Secondary | ICD-10-CM | POA: Diagnosis not present

## 2023-02-01 DIAGNOSIS — M1611 Unilateral primary osteoarthritis, right hip: Secondary | ICD-10-CM | POA: Diagnosis not present

## 2023-02-08 DIAGNOSIS — R296 Repeated falls: Secondary | ICD-10-CM | POA: Diagnosis not present

## 2023-02-08 DIAGNOSIS — J449 Chronic obstructive pulmonary disease, unspecified: Secondary | ICD-10-CM | POA: Diagnosis not present

## 2023-02-08 DIAGNOSIS — F17210 Nicotine dependence, cigarettes, uncomplicated: Secondary | ICD-10-CM | POA: Diagnosis not present

## 2023-02-08 DIAGNOSIS — F419 Anxiety disorder, unspecified: Secondary | ICD-10-CM | POA: Diagnosis not present

## 2023-02-08 DIAGNOSIS — F32A Depression, unspecified: Secondary | ICD-10-CM | POA: Diagnosis not present

## 2023-02-08 DIAGNOSIS — M1611 Unilateral primary osteoarthritis, right hip: Secondary | ICD-10-CM | POA: Diagnosis not present

## 2023-02-10 DIAGNOSIS — F331 Major depressive disorder, recurrent, moderate: Secondary | ICD-10-CM | POA: Diagnosis not present

## 2023-02-13 ENCOUNTER — Other Ambulatory Visit: Payer: Self-pay | Admitting: Family Medicine

## 2023-02-13 DIAGNOSIS — F411 Generalized anxiety disorder: Secondary | ICD-10-CM

## 2023-02-14 ENCOUNTER — Telehealth: Payer: Self-pay | Admitting: Family Medicine

## 2023-02-14 ENCOUNTER — Inpatient Hospital Stay: Payer: Medicare Other | Admitting: Hematology and Oncology

## 2023-02-14 NOTE — Telephone Encounter (Signed)
Per 3/11 IB reached out to patient to reschedule appointment.

## 2023-02-15 DIAGNOSIS — F32A Depression, unspecified: Secondary | ICD-10-CM | POA: Diagnosis not present

## 2023-02-15 DIAGNOSIS — J449 Chronic obstructive pulmonary disease, unspecified: Secondary | ICD-10-CM | POA: Diagnosis not present

## 2023-02-15 DIAGNOSIS — F17210 Nicotine dependence, cigarettes, uncomplicated: Secondary | ICD-10-CM | POA: Diagnosis not present

## 2023-02-15 DIAGNOSIS — F419 Anxiety disorder, unspecified: Secondary | ICD-10-CM | POA: Diagnosis not present

## 2023-02-15 DIAGNOSIS — M1611 Unilateral primary osteoarthritis, right hip: Secondary | ICD-10-CM | POA: Diagnosis not present

## 2023-02-15 DIAGNOSIS — R296 Repeated falls: Secondary | ICD-10-CM | POA: Diagnosis not present

## 2023-02-17 DIAGNOSIS — F331 Major depressive disorder, recurrent, moderate: Secondary | ICD-10-CM | POA: Diagnosis not present

## 2023-02-24 DIAGNOSIS — F331 Major depressive disorder, recurrent, moderate: Secondary | ICD-10-CM | POA: Diagnosis not present

## 2023-03-03 ENCOUNTER — Inpatient Hospital Stay: Payer: Medicare Other | Attending: Hematology and Oncology | Admitting: Hematology and Oncology

## 2023-03-03 ENCOUNTER — Other Ambulatory Visit: Payer: Self-pay

## 2023-03-03 VITALS — BP 127/69 | HR 102 | Temp 97.5°F | Resp 18 | Ht 64.0 in | Wt 195.5 lb

## 2023-03-03 DIAGNOSIS — F17210 Nicotine dependence, cigarettes, uncomplicated: Secondary | ICD-10-CM | POA: Insufficient documentation

## 2023-03-03 DIAGNOSIS — Z79811 Long term (current) use of aromatase inhibitors: Secondary | ICD-10-CM | POA: Insufficient documentation

## 2023-03-03 DIAGNOSIS — Z803 Family history of malignant neoplasm of breast: Secondary | ICD-10-CM | POA: Diagnosis not present

## 2023-03-03 DIAGNOSIS — Z170 Estrogen receptor positive status [ER+]: Secondary | ICD-10-CM | POA: Diagnosis not present

## 2023-03-03 DIAGNOSIS — D0581 Other specified type of carcinoma in situ of right breast: Secondary | ICD-10-CM | POA: Insufficient documentation

## 2023-03-03 DIAGNOSIS — M8589 Other specified disorders of bone density and structure, multiple sites: Secondary | ICD-10-CM

## 2023-03-03 NOTE — Progress Notes (Signed)
[place] NOTE  Patient Care Team: Laurey Morale, MD as PCP - General (Family Medicine) Benay Pike, MD as Consulting Physician (Hematology and Oncology) Mauro Kaufmann, RN as Oncology Nurse Navigator Rockwell Germany, RN as Oncology Nurse Navigator Jovita Kussmaul, MD as Consulting Physician (General Surgery)  CHIEF COMPLAINTS/PURPOSE OF CONSULTATION:  Newly diagnosed breast cancer  HISTORY OF PRESENTING ILLNESS:  Emily Horton 77 y.o. female is here because of recent diagnosis of right breast papillary carcinoma in situ  I reviewed her records extensively and collaborated the history with the patient.  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  Tissue sampling of both of these masses recommended.  No abnormal appearing right axillary lymph nodes.   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  Prognostic showed strong positivity in estrogen and progesterone at both sites.   09/20/2022 Definitive Surgery   She had right breast mastectomy which showed grade 1 IDC arising in a low grade ductal carcinoma/encapsulated papillary carcinoma. Resection margins are neg for carcinoma. Rt axillary SLN neg for malignancy. ER PR 100% Her 2 2+, neg by FISH.     Emily Horton is here for follow-up with her daughter today.  She has been taking her medication anastrozole as recommended.  1 daughter however complains that mom has been very depressed, has been staying home, crying all the time which has been going even prior  to starting anastrozole.  Since dad died, mom has been depressed.  She has been taking some depression medications although these have not been helpful.  Daughter thinks mom should try going to an assisted living or community home where she can have more social life.  Emily Horton also mentions that although she does not want to do that, this may be the right thing for her.  She denies any adverse effects with anastrozole.  She denies any breast changes although she has noticed some breast tenderness in the left breast all of a sudden.  She did not palpate any masses. Rest of the pertinent 10 point ROS reviewed and negative  MEDICAL HISTORY:  Past Medical History:  Diagnosis Date   Allergy    SEASONAL   Anxiety    Arthritis    right hip, right shoulder   Blood transfusion without reported diagnosis    "when had hip surgery"   Cancer ([place]) 07/2022   right breast papillary carcinoma in situ   Colon polyps 09/25/2007   Colonoscopy   COPD (chronic obstructive pulmonary disease) ([place])    smoker 1/2ppd   Depression    Diverticulosis of colon (without mention of hemorrhage) 09/25/2007   Colonoscopy   GERD (gastroesophageal reflux disease)    Gynecological examination    sees Dr. Carren Rang    Hyperlipidemia    Osteopenia     SURGICAL HISTORY: Past Surgical History:  Procedure Laterality Date   COLONOSCOPY  12/30/2015   per Dr. Henrene Pastor, benign  polyps, repeat in 5  yrs.    ESOPHAGOGASTRODUODENOSCOPY  06/30/2012   per Dr. Sharlett Iles, reflux  but no Barretts seen    HARDWARE REMOVAL Right 02/01/2020   Procedure: HARDWARE REMOVAL;  Surgeon: Mcarthur Rossetti, MD;  [facility];  Service: Orthopedics;  Laterality: Right;   INTRAMEDULLARY (IM) NAIL INTERTROCHANTERIC Right 06/22/2019   INTRAMEDULLARY (IM) NAIL INTERTROCHANTERIC Right 06/22/2019   Procedure: INTRAMEDULLARY (IM) NAIL INTERTROCHANTRIC;  Surgeon: Altamese Bertha, MD;  Location: [place];  Service: Orthopedics;  Laterality: Right;    MASTECTOMY W/ SENTINEL NODE BIOPSY Right 09/20/2022   Procedure: RIGHT MASTECTOMY WITH SENTINEL LYMPH NODE BIOPSY;  Surgeon: Jovita Kussmaul, MD;  Location: [place];  Service: General;  Laterality: Right;   POLYPECTOMY     REVERSE SHOULDER ARTHROPLASTY Right 01/29/2020   Procedure: RIGHT REVERSE SHOULDER ARTHROPLASTY;  Surgeon: Marchia Bond, MD;  [facility];  Service: Orthopedics;  Laterality: Right;   TOTAL HIP ARTHROPLASTY Right 02/01/2020   Procedure: TOTAL HIP ARTHROPLASTY ANTERIOR APPROACH;  Surgeon: Mcarthur Rossetti, MD;  [facility];  Service: Orthopedics;  Laterality: Right;    SOCIAL HISTORY: Social History   Socioeconomic History   Marital status: Widowed    Spouse name: Not on file   Number of children: 2   Years of education: Not on file   Highest education level: Not on file  Occupational History   Occupation: retired Pharmacist, hospital  Tobacco Use   Smoking status: Every Day    Packs/day: 0.50    Years: 30.00    Additional pack years: 0.00    Total pack years: 15.00    Types: Cigarettes    Passive exposure: Past   Smokeless tobacco: Never   Tobacco comments:    Patient currently cutting down on cigaretts  Vaping Use   Vaping Use: Never used  Substance and Sexual Activity   Alcohol use: Yes    Alcohol/week: 0.0 standard drinks of alcohol    Comment: occasional   Drug use: No   Sexual activity: Not Currently    Birth control/protection: Post-menopausal  Other Topics Concern   Not on file  Social History Narrative   Right handed   Social Determinants of Health   Financial Resource Strain: Low Risk  (05/11/2022)   Overall Financial Resource Strain (CARDIA)    Difficulty of Paying Living Expenses: Not hard at all  Food Insecurity: No Food Insecurity (05/11/2022)   Hunger Vital Sign    Worried About Running Out of Food in the Last Year: Never true    Ran Out of Food in the Last Year: Never true  Transportation Needs: No  Transportation Needs (05/11/2022)   PRAPARE - Hydrologist (Medical): No    Lack of Transportation (Non-Medical): No  Physical Activity: Inactive (05/11/2022)   Exercise Vital Sign    Days of Exercise per Week: 0 days    Minutes of Exercise per Session: 0 min  Stress: No Stress Concern Present (05/11/2022)   [place]    Feeling of Stress : Not at all  Social Connections: Moderately Integrated (05/11/2022)   Social Connection and Isolation Panel [NHANES]    Frequency of Communication with Friends and Family: More than three times a week    Frequency of Social Gatherings with Friends and Family: More than three times a week    Attends Religious Services: More than 4 times per year    Active Member of [employer] or Organizations: Yes    Attends Archivist Meetings: 1 to 4 times per year  Marital Status: Widowed  Intimate Partner Violence: Not At Risk (05/11/2022)   Humiliation, Afraid, Rape, and Kick questionnaire    Fear of Current or Ex-Partner: No    Emotionally Abused: No    Physically Abused: No    Sexually Abused: No    FAMILY HISTORY: Family History  Problem Relation Age of Onset   Crohn's disease Father    Colon polyps Father    Breast cancer Maternal Grandmother    Stroke Paternal Grandfather    Colon cancer Neg Hx    Esophageal cancer Neg Hx    Rectal cancer Neg Hx    Stomach cancer Neg Hx     ALLERGIES:  is allergic to egg-derived products and zoloft [sertraline hcl].  MEDICATIONS:  Current Outpatient Medications  Medication Sig Dispense Refill   anastrozole (ARIMIDEX) 1 MG tablet Take 1 tablet (1 mg total) by mouth daily. 90 tablet 3   buPROPion (WELLBUTRIN XL) 300 MG 24 hr tablet TAKE 1 TABLET(300 MG) BY MOUTH EVERY MORNING 90 tablet 3   busPIRone (BUSPAR) 15 MG tablet TAKE 1 TABLET(15 MG) BY MOUTH TWICE DAILY 60 tablet 1   cetirizine (ZYRTEC) 10 MG tablet TAKE 1  TABLET(10 MG) BY MOUTH DAILY 90 tablet 3   clonazePAM (KLONOPIN) 0.5 MG tablet Take 1 tablet (0.5 mg total) by mouth 3 (three) times daily as needed for anxiety. 90 tablet 2   denosumab (PROLIA) 60 MG/ML SOSY injection Inject 60 mg into the skin every 6 (six) months.     EPINEPHrine 0.3 mg/0.3 mL IJ SOAJ injection Inject 0.3 mLs (0.3 mg total) into the muscle daily as needed for anaphylaxis. 1 each 0   meloxicam (MOBIC) 15 MG tablet TAKE 1 TABLET(15 MG) BY MOUTH DAILY 90 tablet 0   Menthol, Topical Analgesic, (BIOFREEZE) 4 % GEL Apply topically. APPLY TO RIGHT SHOULDER Twice A Day - PRN     methocarbamol (ROBAXIN) 500 MG tablet Take 1 tablet (500 mg total) by mouth every 6 (six) hours as needed for muscle spasms. 30 tablet 1   omeprazole (PRILOSEC) 40 MG capsule Take 1 capsule (40 mg total) by mouth 2 (two) times daily. 180 capsule 3   ondansetron (ZOFRAN) 4 MG tablet Take 1 tablet (4 mg total) by mouth every 8 (eight) hours as needed for nausea or vomiting. 10 tablet 0   oxyCODONE (OXY IR/ROXICODONE) 5 MG immediate release tablet Take 1-2 tablets (5-10 mg total) by mouth every 6 (six) hours as needed for moderate pain. 15 tablet 0   polyethylene glycol (MIRALAX / GLYCOLAX) 17 g packet Take 17 g by mouth daily as needed for moderate constipation or severe constipation. 14 each 0   sennosides-docusate sodium (SENOKOT-S) 8.6-50 MG tablet Take 2 tablets by mouth daily.     triamcinolone cream (KENALOG) 0.1 % Apply 1 application topically 2 (two) times daily. 453.6 g 3   No current facility-administered medications for this visit.    REVIEW OF SYSTEMS:   Constitutional: Denies fevers, chills or abnormal night sweats Eyes: Denies blurriness of vision, double vision or watery eyes Ears, nose, mouth, throat, and face: Denies mucositis or sore throat Respiratory: Denies cough, dyspnea or wheezes Cardiovascular: Denies palpitation, chest discomfort or lower extremity swelling Gastrointestinal:  Denies  nausea, heartburn or change in bowel habits Skin: Denies abnormal skin rashes Lymphatics: Denies new lymphadenopathy or easy bruising Neurological:Denies numbness, tingling or new weaknesses Behavioral/Psych: Mood is stable, no new changes  Breast: Denies any palpable lumps or discharge All other systems  were reviewed with the patient and are negative.  PHYSICAL EXAMINATION: ECOG PERFORMANCE STATUS: 0 - Asymptomatic  Vitals:   03/03/23 1451  BP: 127/69  Pulse: (!) 102  Resp: 18  Temp: (!) 97.5 F (36.4 C)  SpO2: 98%    Filed Weights   03/03/23 1451  Weight: 195 lb 8 oz (88.7 kg)   GENERAL:alert, no distress and comfortable, anxious Mastectomy site healing well. In the left breast there is no palpable masses but definite palpable tenderness in the inferior quadrants..  No regional adenopathy noted.  LABORATORY DATA:  I have reviewed the data as listed Lab Results  Component Value Date   WBC 20.4 (H) 10/02/2022   HGB 15.3 (H) 10/02/2022   HCT 45.0 10/02/2022   MCV 94.7 10/02/2022   PLT 233 10/02/2022   Lab Results  Component Value Date   NA 137 10/02/2022   K 4.2 10/02/2022   CL 104 10/02/2022   CO2 20 (L) 10/02/2022    RADIOGRAPHIC STUDIES: I have personally reviewed the radiological reports and agreed with the findings in the report.  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 Marlou Starks at [facility] who is now status post mastectomy and is here to review final pathology and to initiate antiestrogen therapy.  Mastectomy showed grade 1 encapsulated papillary carcinoma measuring 2.4 cm invasive component out of the 4.3 cm lesion.  This is strongly ER/PR positive.  No evidence of lymph node  involvement.  Given strong ER/PR positivity, papillary carcinoma which is encapsulated, we have discussed about proceeding with antiestrogen therapy.  Given her sedentary status, we have elected to start her on anastrozole.  She is here for follow-up on anastrozole.  She has been doing quite well on this.  She continues to have baseline depression, grief from loss of her husband and has not been socializing, still tends to stay home all the time, is not active.  We have discussed that it may be reasonable to consider what her kids are suggesting, she can explore some  assisted living and see if she likes it.  Patient agrees. With regards to the left breast tenderness, will order diagnostic mammogram of the left breast to be done in the next couple weeks.  She mentions that she may have hurt her left breast and hence it is tender.  I encouraged her to cancel the appointment if the tenderness completely resolves however if it continues, may be best to proceed with a mammogram and she agrees. She is also due for bone density, she was previously on Prolia with Dr. Sarajane Jews.  I encouraged her to stay with Dr. Maceo Pro to continue Prolia, last dose was more than 6 months ago.  I have also ordered a repeat bone density. All her questions were answered to the best my knowledge.  She will return to clinic in 6 months or sooner as needed.  Total time spent: 30 minutes including history, physical exam, review of records, counseling and coordination of care All questions were answered. The patient knows to call the clinic with any problems, questions or concerns.    Benay Pike, MD 03/03/23

## 2023-03-03 NOTE — Assessment & Plan Note (Signed)
This is a very pleasant 77 year old postmenopausal female patient with newly diagnosed self palpated right breast papillary carcinoma in situ at 2 locations, there is another abnormal thyroid area which is yet to be biopsied followed by Dr. Marlou Starks at [facility] surgery who is now status post mastectomy and is here to review final pathology and to initiate antiestrogen therapy.  Mastectomy showed grade 1 encapsulated papillary carcinoma measuring 2.4 cm invasive component out of the 4.3 cm lesion.  This is strongly ER/PR positive.  No evidence of lymph node involvement.  Given strong ER/PR positivity, papillary carcinoma which is encapsulated, we have discussed about proceeding with antiestrogen therapy.  Given her sedentary status, we have elected to start her on anastrozole.  She is here for follow-up on anastrozole.  She has been doing quite well on this.  She continues to have baseline depression, grief from loss of her husband and has not been socializing, still tends to stay home all the time, is not active.  We have discussed that it may be reasonable to consider what her kids are suggesting, she can explore some  assisted living and see if she likes it.  Patient agrees. With regards to the left breast tenderness, will order diagnostic mammogram of the left breast to be done in the next couple weeks.  She mentions that she may have hurt her left breast and hence it is tender.  I encouraged her to cancel the appointment if the tenderness completely resolves however if it continues, may be best to proceed with a mammogram and she agrees. She is also due for bone density, she was previously on Prolia with Dr. Sarajane Jews.  I encouraged her to stay with Dr. Maceo Pro to continue Prolia, last dose was more than 6 months ago.  I have also ordered a repeat bone density. All her questions were answered to the best my knowledge.  She will return to clinic in 6 months or sooner as needed.

## 2023-03-04 ENCOUNTER — Telehealth: Payer: Self-pay | Admitting: Hematology and Oncology

## 2023-03-04 NOTE — Telephone Encounter (Signed)
Reached out to patient to schedule per 3/27 LOS. Left voicemail.

## 2023-03-10 DIAGNOSIS — F331 Major depressive disorder, recurrent, moderate: Secondary | ICD-10-CM | POA: Diagnosis not present

## 2023-03-14 ENCOUNTER — Other Ambulatory Visit: Payer: Self-pay | Admitting: Hematology and Oncology

## 2023-03-14 DIAGNOSIS — N644 Mastodynia: Secondary | ICD-10-CM

## 2023-03-14 DIAGNOSIS — M8589 Other specified disorders of bone density and structure, multiple sites: Secondary | ICD-10-CM

## 2023-03-14 DIAGNOSIS — D0581 Other specified type of carcinoma in situ of right breast: Secondary | ICD-10-CM

## 2023-03-29 ENCOUNTER — Emergency Department (HOSPITAL_COMMUNITY): Payer: Medicare Other

## 2023-03-29 ENCOUNTER — Inpatient Hospital Stay (HOSPITAL_COMMUNITY): Payer: Medicare Other

## 2023-03-29 ENCOUNTER — Encounter (HOSPITAL_COMMUNITY): Payer: Self-pay

## 2023-03-29 ENCOUNTER — Other Ambulatory Visit: Payer: Self-pay

## 2023-03-29 ENCOUNTER — Inpatient Hospital Stay (HOSPITAL_COMMUNITY)
Admission: EM | Admit: 2023-03-29 | Discharge: 2023-04-04 | DRG: 543 | Disposition: A | Discharge status: Home / Self Care | Payer: Medicare Other | Attending: Internal Medicine | Admitting: Internal Medicine

## 2023-03-29 DIAGNOSIS — J449 Chronic obstructive pulmonary disease, unspecified: Secondary | ICD-10-CM | POA: Diagnosis present

## 2023-03-29 DIAGNOSIS — Z853 Personal history of malignant neoplasm of breast: Secondary | ICD-10-CM | POA: Diagnosis not present

## 2023-03-29 DIAGNOSIS — S3282XA Multiple fractures of pelvis without disruption of pelvic ring, initial encounter for closed fracture: Secondary | ICD-10-CM | POA: Diagnosis not present

## 2023-03-29 DIAGNOSIS — Z96611 Presence of right artificial shoulder joint: Secondary | ICD-10-CM | POA: Diagnosis not present

## 2023-03-29 DIAGNOSIS — Z602 Problems related to living alone: Secondary | ICD-10-CM | POA: Diagnosis present

## 2023-03-29 DIAGNOSIS — C50811 Malignant neoplasm of overlapping sites of right female breast: Secondary | ICD-10-CM | POA: Diagnosis not present

## 2023-03-29 DIAGNOSIS — M84451G Pathological fracture, right femur, subsequent encounter for fracture with delayed healing: Secondary | ICD-10-CM | POA: Diagnosis present

## 2023-03-29 DIAGNOSIS — S32501A Unspecified fracture of right pubis, initial encounter for closed fracture: Secondary | ICD-10-CM | POA: Diagnosis not present

## 2023-03-29 DIAGNOSIS — W1830XA Fall on same level, unspecified, initial encounter: Secondary | ICD-10-CM | POA: Diagnosis present

## 2023-03-29 DIAGNOSIS — Z7983 Long term (current) use of bisphosphonates: Secondary | ICD-10-CM | POA: Diagnosis not present

## 2023-03-29 DIAGNOSIS — M25551 Pain in right hip: Secondary | ICD-10-CM | POA: Diagnosis not present

## 2023-03-29 DIAGNOSIS — Z87891 Personal history of nicotine dependence: Secondary | ICD-10-CM

## 2023-03-29 DIAGNOSIS — R9431 Abnormal electrocardiogram [ECG] [EKG]: Secondary | ICD-10-CM | POA: Diagnosis not present

## 2023-03-29 DIAGNOSIS — Z7401 Bed confinement status: Secondary | ICD-10-CM | POA: Diagnosis not present

## 2023-03-29 DIAGNOSIS — M8008XA Age-related osteoporosis with current pathological fracture, vertebra(e), initial encounter for fracture: Secondary | ICD-10-CM | POA: Diagnosis present

## 2023-03-29 DIAGNOSIS — F339 Major depressive disorder, recurrent, unspecified: Secondary | ICD-10-CM | POA: Diagnosis present

## 2023-03-29 DIAGNOSIS — R2689 Other abnormalities of gait and mobility: Secondary | ICD-10-CM | POA: Diagnosis not present

## 2023-03-29 DIAGNOSIS — Z823 Family history of stroke: Secondary | ICD-10-CM

## 2023-03-29 DIAGNOSIS — M800AXA Age-related osteoporosis with current pathological fracture, other site, initial encounter for fracture: Principal | ICD-10-CM | POA: Diagnosis present

## 2023-03-29 DIAGNOSIS — Y92009 Unspecified place in unspecified non-institutional (private) residence as the place of occurrence of the external cause: Secondary | ICD-10-CM

## 2023-03-29 DIAGNOSIS — M810 Age-related osteoporosis without current pathological fracture: Secondary | ICD-10-CM | POA: Diagnosis not present

## 2023-03-29 DIAGNOSIS — R296 Repeated falls: Secondary | ICD-10-CM | POA: Diagnosis not present

## 2023-03-29 DIAGNOSIS — Z803 Family history of malignant neoplasm of breast: Secondary | ICD-10-CM | POA: Diagnosis not present

## 2023-03-29 DIAGNOSIS — K589 Irritable bowel syndrome without diarrhea: Secondary | ICD-10-CM | POA: Diagnosis not present

## 2023-03-29 DIAGNOSIS — M6281 Muscle weakness (generalized): Secondary | ICD-10-CM | POA: Diagnosis not present

## 2023-03-29 DIAGNOSIS — K219 Gastro-esophageal reflux disease without esophagitis: Secondary | ICD-10-CM | POA: Diagnosis present

## 2023-03-29 DIAGNOSIS — S72001A Fracture of unspecified part of neck of right femur, initial encounter for closed fracture: Secondary | ICD-10-CM | POA: Diagnosis not present

## 2023-03-29 DIAGNOSIS — D0511 Intraductal carcinoma in situ of right breast: Secondary | ICD-10-CM | POA: Diagnosis present

## 2023-03-29 DIAGNOSIS — S32591A Other specified fracture of right pubis, initial encounter for closed fracture: Secondary | ICD-10-CM | POA: Diagnosis not present

## 2023-03-29 DIAGNOSIS — S32599D Other specified fracture of unspecified pubis, subsequent encounter for fracture with routine healing: Secondary | ICD-10-CM | POA: Diagnosis not present

## 2023-03-29 DIAGNOSIS — S79911A Unspecified injury of right hip, initial encounter: Secondary | ICD-10-CM | POA: Diagnosis not present

## 2023-03-29 DIAGNOSIS — E785 Hyperlipidemia, unspecified: Secondary | ICD-10-CM | POA: Diagnosis present

## 2023-03-29 DIAGNOSIS — S32599A Other specified fracture of unspecified pubis, initial encounter for closed fracture: Secondary | ICD-10-CM | POA: Diagnosis present

## 2023-03-29 DIAGNOSIS — I10 Essential (primary) hypertension: Secondary | ICD-10-CM | POA: Diagnosis not present

## 2023-03-29 DIAGNOSIS — R262 Difficulty in walking, not elsewhere classified: Secondary | ICD-10-CM | POA: Diagnosis present

## 2023-03-29 DIAGNOSIS — F419 Anxiety disorder, unspecified: Secondary | ICD-10-CM | POA: Diagnosis not present

## 2023-03-29 DIAGNOSIS — E559 Vitamin D deficiency, unspecified: Secondary | ICD-10-CM | POA: Diagnosis not present

## 2023-03-29 DIAGNOSIS — S3210XA Unspecified fracture of sacrum, initial encounter for closed fracture: Secondary | ICD-10-CM | POA: Diagnosis not present

## 2023-03-29 DIAGNOSIS — Z96641 Presence of right artificial hip joint: Secondary | ICD-10-CM | POA: Diagnosis present

## 2023-03-29 DIAGNOSIS — W19XXXA Unspecified fall, initial encounter: Secondary | ICD-10-CM

## 2023-03-29 DIAGNOSIS — Z79899 Other long term (current) drug therapy: Secondary | ICD-10-CM | POA: Diagnosis not present

## 2023-03-29 DIAGNOSIS — M159 Polyosteoarthritis, unspecified: Secondary | ICD-10-CM | POA: Diagnosis not present

## 2023-03-29 DIAGNOSIS — Z043 Encounter for examination and observation following other accident: Secondary | ICD-10-CM | POA: Diagnosis not present

## 2023-03-29 DIAGNOSIS — Z791 Long term (current) use of non-steroidal anti-inflammatories (NSAID): Secondary | ICD-10-CM

## 2023-03-29 DIAGNOSIS — S72009A Fracture of unspecified part of neck of unspecified femur, initial encounter for closed fracture: Secondary | ICD-10-CM | POA: Diagnosis not present

## 2023-03-29 DIAGNOSIS — F411 Generalized anxiety disorder: Secondary | ICD-10-CM | POA: Diagnosis not present

## 2023-03-29 DIAGNOSIS — J309 Allergic rhinitis, unspecified: Secondary | ICD-10-CM | POA: Diagnosis not present

## 2023-03-29 DIAGNOSIS — S32511A Fracture of superior rim of right pubis, initial encounter for closed fracture: Secondary | ICD-10-CM | POA: Diagnosis not present

## 2023-03-29 DIAGNOSIS — S32592A Other specified fracture of left pubis, initial encounter for closed fracture: Secondary | ICD-10-CM | POA: Diagnosis not present

## 2023-03-29 LAB — URINALYSIS, ROUTINE W REFLEX MICROSCOPIC
Bacteria, UA: NONE SEEN
Bilirubin Urine: NEGATIVE
Glucose, UA: NEGATIVE mg/dL
Ketones, ur: NEGATIVE mg/dL
Leukocytes,Ua: NEGATIVE
Nitrite: NEGATIVE
Protein, ur: NEGATIVE mg/dL
Specific Gravity, Urine: 1.008 (ref 1.005–1.030)
pH: 7 (ref 5.0–8.0)

## 2023-03-29 LAB — CBC WITH DIFFERENTIAL/PLATELET
Abs Immature Granulocytes: 0.05 10*3/uL (ref 0.00–0.07)
Basophils Absolute: 0 10*3/uL (ref 0.0–0.1)
Basophils Relative: 0 %
Eosinophils Absolute: 0.1 10*3/uL (ref 0.0–0.5)
Eosinophils Relative: 1 %
HCT: 40.3 % (ref 36.0–46.0)
Hemoglobin: 13.5 g/dL (ref 12.0–15.0)
Immature Granulocytes: 1 %
Lymphocytes Relative: 19 %
Lymphs Abs: 1.6 10*3/uL (ref 0.7–4.0)
MCH: 31.2 pg (ref 26.0–34.0)
MCHC: 33.5 g/dL (ref 30.0–36.0)
MCV: 93.1 fL (ref 80.0–100.0)
Monocytes Absolute: 0.7 10*3/uL (ref 0.1–1.0)
Monocytes Relative: 8 %
Neutro Abs: 6.2 10*3/uL (ref 1.7–7.7)
Neutrophils Relative %: 71 %
Platelets: 202 10*3/uL (ref 150–400)
RBC: 4.33 MIL/uL (ref 3.87–5.11)
RDW: 12.6 % (ref 11.5–15.5)
WBC: 8.7 10*3/uL (ref 4.0–10.5)
nRBC: 0 % (ref 0.0–0.2)

## 2023-03-29 LAB — COMPREHENSIVE METABOLIC PANEL
ALT: 10 U/L (ref 0–44)
AST: 12 U/L — ABNORMAL LOW (ref 15–41)
Albumin: 4.1 g/dL (ref 3.5–5.0)
Alkaline Phosphatase: 83 U/L (ref 38–126)
Anion gap: 8 (ref 5–15)
BUN: 25 mg/dL — ABNORMAL HIGH (ref 8–23)
CO2: 24 mmol/L (ref 22–32)
Calcium: 9.6 mg/dL (ref 8.9–10.3)
Chloride: 106 mmol/L (ref 98–111)
Creatinine, Ser: 1.03 mg/dL — ABNORMAL HIGH (ref 0.44–1.00)
GFR, Estimated: 57 mL/min — ABNORMAL LOW (ref >60)
Glucose, Bld: 108 mg/dL — ABNORMAL HIGH (ref 70–99)
Potassium: 3.8 mmol/L (ref 3.5–5.1)
Sodium: 138 mmol/L (ref 135–145)
Total Bilirubin: 1.1 mg/dL (ref 0.3–1.2)
Total Protein: 7 g/dL (ref 6.5–8.1)

## 2023-03-29 LAB — TYPE AND SCREEN
ABO/RH(D): O POS
Antibody Screen: NEGATIVE

## 2023-03-29 MED ORDER — SENNOSIDES-DOCUSATE SODIUM 8.6-50 MG PO TABS
2.0000 | ORAL_TABLET | Freq: Every day | ORAL | Status: DC
  Administered 2023-03-29 – 2023-04-03 (×6): 2 via ORAL
  Filled 2023-03-29 (×6): qty 2

## 2023-03-29 MED ORDER — MELOXICAM 15 MG PO TABS
15.0000 mg | ORAL_TABLET | Freq: Every day | ORAL | Status: DC
  Administered 2023-03-29 – 2023-04-04 (×7): 15 mg via ORAL
  Filled 2023-03-29 (×7): qty 1

## 2023-03-29 MED ORDER — SODIUM CHLORIDE 0.9% FLUSH
3.0000 mL | INTRAVENOUS | Status: DC | PRN
  Administered 2023-04-01: 3 mL via INTRAVENOUS

## 2023-03-29 MED ORDER — ONDANSETRON HCL 4 MG/2ML IJ SOLN
4.0000 mg | Freq: Four times a day (QID) | INTRAMUSCULAR | Status: DC | PRN
  Administered 2023-03-29: 4 mg via INTRAVENOUS

## 2023-03-29 MED ORDER — SODIUM CHLORIDE 0.9% FLUSH
3.0000 mL | Freq: Two times a day (BID) | INTRAVENOUS | Status: DC
  Administered 2023-03-29 – 2023-04-03 (×10): 3 mL via INTRAVENOUS

## 2023-03-29 MED ORDER — MORPHINE SULFATE (PF) 4 MG/ML IV SOLN
4.0000 mg | Freq: Once | INTRAVENOUS | Status: AC
  Administered 2023-03-29: 4 mg via INTRAVENOUS
  Filled 2023-03-29: qty 1

## 2023-03-29 MED ORDER — BUSPIRONE HCL 10 MG PO TABS: 15.0000 mg | ORAL_TABLET | Freq: Two times a day (BID) | ORAL | Status: DC

## 2023-03-29 MED ORDER — SODIUM CHLORIDE 0.9 % IV SOLN: 250.0000 mL | INTRAVENOUS | Status: DC | PRN

## 2023-03-29 MED ORDER — ANASTROZOLE 1 MG PO TABS
1.0000 mg | ORAL_TABLET | Freq: Every day | ORAL | Status: DC
  Administered 2023-03-29 – 2023-04-04 (×7): 1 mg via ORAL
  Filled 2023-03-29 (×8): qty 1

## 2023-03-29 MED ORDER — CLONAZEPAM 0.5 MG PO TABS
0.5000 mg | ORAL_TABLET | Freq: Three times a day (TID) | ORAL | Status: DC | PRN
  Administered 2023-03-29: 0.5 mg via ORAL
  Filled 2023-03-29: qty 1

## 2023-03-29 MED ORDER — LORATADINE 10 MG PO TABS
10.0000 mg | ORAL_TABLET | Freq: Every day | ORAL | Status: DC
  Administered 2023-03-30 – 2023-04-04 (×6): 10 mg via ORAL
  Filled 2023-03-29 (×7): qty 1

## 2023-03-29 MED ORDER — MORPHINE SULFATE (PF) 2 MG/ML IV SOLN
2.0000 mg | INTRAVENOUS | Status: DC | PRN
  Administered 2023-03-29 – 2023-04-02 (×6): 2 mg via INTRAVENOUS
  Filled 2023-03-29 (×6): qty 1

## 2023-03-29 MED ORDER — OXYCODONE HCL 5 MG PO TABS
5.0000 mg | ORAL_TABLET | Freq: Four times a day (QID) | ORAL | Status: DC | PRN
  Administered 2023-03-29: 5 mg via ORAL
  Filled 2023-03-29: qty 1

## 2023-03-29 MED ORDER — POLYETHYLENE GLYCOL 3350 17 G PO PACK
34.0000 g | PACK | Freq: Every day | ORAL | Status: DC
  Administered 2023-03-29 – 2023-04-02 (×5): 34 g via ORAL
  Filled 2023-03-29 (×7): qty 2

## 2023-03-29 MED ORDER — ONDANSETRON HCL 4 MG/2ML IJ SOLN
INTRAMUSCULAR | Status: AC
  Filled 2023-03-29: qty 2

## 2023-03-29 MED ORDER — ENOXAPARIN SODIUM 40 MG/0.4ML IJ SOSY
40.0000 mg | PREFILLED_SYRINGE | INTRAMUSCULAR | Status: DC
  Administered 2023-03-29 – 2023-04-03 (×6): 40 mg via SUBCUTANEOUS
  Filled 2023-03-29 (×6): qty 0.4

## 2023-03-29 MED ORDER — BUPROPION HCL ER (XL) 300 MG PO TB24
300.0000 mg | ORAL_TABLET | Freq: Every day | ORAL | Status: DC
  Administered 2023-03-30 – 2023-04-04 (×6): 300 mg via ORAL
  Filled 2023-03-29 (×6): qty 1

## 2023-03-29 MED ORDER — BUSPIRONE HCL 5 MG PO TABS
7.5000 mg | ORAL_TABLET | Freq: Every day | ORAL | Status: DC
  Administered 2023-03-29 – 2023-04-04 (×7): 7.5 mg via ORAL
  Filled 2023-03-29 (×7): qty 2

## 2023-03-29 NOTE — H&P (Signed)
History and Physical    [name] [name]:[id number] DOB: Sep 29, 1946 DOA: 03/29/2023  PCP: [name], MD  Patient coming from: home   Chief Complaint: fall  HPI: Emily Horton is a 77 y.o. female with medical history significant for breast cancer, osteoporosis, hip fracture, who presents with the above.  Normally ambulates with a walker. Is in her usual state of health, no cough or chest pain or fevers. When ambulating with walker yesterday one of her legs "gave out" and she fell on right hip. No head trauma or LOC. Very painful to walk after that, trouble staying on her feet. No bruising.     Review of Systems: As per HPI otherwise 10 point review of systems negative.    Past Medical History:  Diagnosis Date   Allergy    SEASONAL   Anxiety    Arthritis    right hip, right shoulder   Blood transfusion without reported diagnosis    "when had hip surgery"   Cancer 07/2022   right breast papillary carcinoma in situ   Colon polyps 09/25/2007   Colonoscopy   COPD (chronic obstructive pulmonary disease)    smoker 1/2ppd   Depression    Diverticulosis of colon (without mention of hemorrhage) 09/25/2007   Colonoscopy   GERD (gastroesophageal reflux disease)    Gynecological examination    sees Dr. Chevis Pretty    Hyperlipidemia    Osteopenia     Past Surgical History:  Procedure Laterality Date   COLONOSCOPY  12/30/2015   per Dr. Marina Goodell, benign  polyps, repeat in 5  yrs.    ESOPHAGOGASTRODUODENOSCOPY  06/30/2012   per Dr. Jarold Motto, reflux but no Barretts seen    HARDWARE REMOVAL Right 02/01/2020   Procedure: HARDWARE REMOVAL;  Surgeon: Kathryne Hitch, MD;  Location: [facility];  Service: Orthopedics;  Laterality: Right;   INTRAMEDULLARY (IM) NAIL INTERTROCHANTERIC Right 06/22/2019   INTRAMEDULLARY (IM) NAIL INTERTROCHANTERIC Right 06/22/2019   Procedure: INTRAMEDULLARY (IM) NAIL INTERTROCHANTRIC;  Surgeon: Myrene Galas, MD;  [facility]: [facility];  Service: Orthopedics;   Laterality: Right;   MASTECTOMY W/ SENTINEL NODE BIOPSY Right 09/20/2022   Procedure: RIGHT MASTECTOMY WITH SENTINEL LYMPH NODE BIOPSY;  Surgeon: Griselda Miner, MD;  Location: [facility];  Service: General;  Laterality: Right;   POLYPECTOMY     REVERSE SHOULDER ARTHROPLASTY Right 01/29/2020   Procedure: RIGHT REVERSE SHOULDER ARTHROPLASTY;  Surgeon: Teryl Lucy, MD;  Location: [facility];  Service: Orthopedics;  Laterality: Right;   TOTAL HIP ARTHROPLASTY Right 02/01/2020   Procedure: TOTAL HIP ARTHROPLASTY ANTERIOR APPROACH;  Surgeon: Kathryne Hitch, MD;  Location: [facility];  Service: Orthopedics;  Laterality: Right;     reports that she has been smoking cigarettes. She has a 15.00 pack-year smoking history. She has been exposed to tobacco smoke. She has never used smokeless tobacco. She reports current alcohol use. She reports that she does not use drugs.  Allergies  Allergen Reactions   Egg-Derived Products     Hives as child    Zoloft [Sertraline Hcl]     Headaches     Family History  Problem Relation Age of Onset   Crohn's disease Father    Colon polyps Father    Breast cancer Maternal Grandmother    Stroke Paternal Grandfather    Colon cancer Neg Hx    Esophageal cancer Neg Hx    Rectal cancer Neg Hx    Stomach cancer Neg Hx     Prior to  Admission medications   Medication Sig Start Date End Date Taking? Authorizing Provider  anastrozole (ARIMIDEX) 1 MG tablet Take 1 tablet (1 mg total) by mouth daily. 10/14/22   Rachel Moulds, MD  buPROPion (WELLBUTRIN XL) 300 MG 24 hr tablet TAKE 1 TABLET(300 MG) BY MOUTH EVERY MORNING 09/23/22   [name], MD  busPIRone (BUSPAR) 15 MG tablet TAKE 1 TABLET(15 MG) BY MOUTH TWICE DAILY 02/14/23   [name], MD  cetirizine (ZYRTEC) 10 MG tablet TAKE 1 TABLET(10 MG) BY MOUTH DAILY 09/23/22   [name], MD  clonazePAM (KLONOPIN) 0.5 MG tablet Take 1 tablet (0.5 mg total) by mouth 3 (three) times daily as  needed for anxiety. 10/05/22   [name], MD  denosumab (PROLIA) 60 MG/ML SOSY injection Inject 60 mg into the skin every 6 (six) months.    [provider]  EPINEPHrine 0.3 mg/0.3 mL IJ SOAJ injection Inject 0.3 mLs (0.3 mg total) into the muscle daily as needed for anaphylaxis. 05/27/20   Mahlon Gammon, MD  meloxicam (MOBIC) 15 MG tablet TAKE 1 TABLET(15 MG) BY MOUTH DAILY 12/30/22   [name], MD  Menthol, Topical Analgesic, (BIOFREEZE) 4 % GEL Apply topically. APPLY TO RIGHT SHOULDER Twice A Day - PRN    [provider]  methocarbamol (ROBAXIN) 500 MG tablet Take 1 tablet (500 mg total) by mouth every 6 (six) hours as needed for muscle spasms. 09/21/22   Griselda Miner, MD  omeprazole (PRILOSEC) 40 MG capsule Take 1 capsule (40 mg total) by mouth 2 (two) times daily. 12/11/21   [name], MD  ondansetron (ZOFRAN) 4 MG tablet Take 1 tablet (4 mg total) by mouth every 8 (eight) hours as needed for nausea or vomiting. 01/29/20   Janine Ores K, PA-C  oxyCODONE (OXY IR/ROXICODONE) 5 MG immediate release tablet Take 1-2 tablets (5-10 mg total) by mouth every 6 (six) hours as needed for moderate pain. 09/21/22   Chevis Pretty III, MD  polyethylene glycol (MIRALAX / GLYCOLAX) 17 g packet Take 17 g by mouth daily as needed for moderate constipation or severe constipation. 06/26/19   Glade Lloyd, MD  sennosides-docusate sodium (SENOKOT-S) 8.6-50 MG tablet Take 2 tablets by mouth daily.    [provider]  triamcinolone cream (KENALOG) 0.1 % Apply 1 application topically 2 (two) times daily. 06/19/19   [name], MD    Physical Exam: Vitals:   03/29/23 1024  BP: (!) 144/100  Pulse: 92  Resp: 18  Temp: 98.2 F (36.8 C)  TempSrc: Oral  SpO2: 97%    Constitutional: No acute distress Head: Atraumatic Eyes: Conjunctiva clear ENM: Moist mucous membranes. Normal dentition.  Neck: Supple Respiratory: Clear to auscultation bilaterally, no  wheezing/rales/rhonchi. Normal respiratory effort. No accessory muscle use. . Cardiovascular: Regular rate and rhythm. No murmurs/rubs/gallops. Abdomen: Non-tender, non-distended. No masses. No rebound or guarding. Positive bowel sounds. Musculoskeletal: No joint deformity upper and lower extremities. Normal ROM, no contractures. Normal muscle tone.  Skin: No rashes, lesions, or ulcers.  Extremities: No peripheral edema. Palpable peripheral pulses.  Neurologic: Alert, moving all 4 extremities. Distal sensation intact Psychiatric: Normal insight and judgement.   Labs on Admission: I have personally reviewed following labs and imaging studies  CBC: Recent Labs  Lab 03/29/23 1050  WBC 8.7  NEUTROABS 6.2  HGB 13.5  HCT 40.3  MCV 93.1  PLT 202   Basic Metabolic Panel: Recent Labs  Lab 03/29/23 1050  NA  138  K 3.8  CL 106  CO2 24  GLUCOSE 108*  BUN 25*  CREATININE 1.03*  CALCIUM 9.6   GFR: CrCl cannot be calculated (Unknown ideal weight.). Liver Function Tests: Recent Labs  Lab 03/29/23 1050  AST 12*  ALT 10  ALKPHOS 83  BILITOT 1.1  PROT 7.0  ALBUMIN 4.1   No results for input(s): "LIPASE", "AMYLASE" in the last 168 hours. No results for input(s): "AMMONIA" in the last 168 hours. Coagulation Profile: No results for input(s): "INR", "PROTIME" in the last 168 hours. Cardiac Enzymes: No results for input(s): "CKTOTAL", "CKMB", "CKMBINDEX", "TROPONINI" in the last 168 hours. BNP (last 3 results) No results for input(s): "PROBNP" in the last 8760 hours. HbA1C: No results for input(s): "HGBA1C" in the last 72 hours. CBG: No results for input(s): "GLUCAP" in the last 168 hours. Lipid Profile: No results for input(s): "CHOL", "HDL", "LDLCALC", "TRIG", "CHOLHDL", "LDLDIRECT" in the last 72 hours. Thyroid Function Tests: No results for input(s): "TSH", "T4TOTAL", "FREET4", "T3FREE", "THYROIDAB" in the last 72 hours. Anemia Panel: No results for input(s):  "VITAMINB12", "FOLATE", "FERRITIN", "TIBC", "IRON", "RETICCTPCT" in the last 72 hours. Urine analysis:    Component Value Date/Time   COLORURINE STRAW (A) 03/29/2023 1101   APPEARANCEUR CLEAR 03/29/2023 1101   LABSPEC 1.008 03/29/2023 1101   PHURINE 7.0 03/29/2023 1101   GLUCOSEU NEGATIVE 03/29/2023 1101   GLUCOSEU NEGATIVE 09/03/2015 0942   HGBUR SMALL (A) 03/29/2023 1101   HGBUR trace-lysed 08/28/2010 0803   BILIRUBINUR NEGATIVE 03/29/2023 1101   BILIRUBINUR neg 12/11/2021 1046   KETONESUR NEGATIVE 03/29/2023 1101   PROTEINUR NEGATIVE 03/29/2023 1101   UROBILINOGEN 0.2 12/11/2021 1046   UROBILINOGEN 0.2 09/03/2015 0942   NITRITE NEGATIVE 03/29/2023 1101   LEUKOCYTESUR NEGATIVE 03/29/2023 1101    Radiological Exams on Admission: DG Chest Port 1 View  Result Date: 03/29/2023 CLINICAL DATA:  Fall. EXAM: PORTABLE CHEST 1 VIEW COMPARISON:  06/21/2019 FINDINGS: The lungs are clear without focal pneumonia, edema, pneumothorax or pleural effusion. The cardiopericardial silhouette is within normal limits for size. No acute bony abnormality. Telemetry leads overlie the chest. IMPRESSION: No active disease. Electronically Signed   By: Kennith Center M.D.   On: 03/29/2023 11:58   DG Hip Unilat W or Wo Pelvis 2-3 Views Right  Result Date: 03/29/2023 CLINICAL DATA:  Fall pain EXAM: DG HIP (WITH OR WITHOUT PELVIS) 2-3V RIGHT COMPARISON:  03/31/2022 FINDINGS: Osteopenia. Status post right hip total arthroplasty. Chronic fracture deformity of the underlying proximal right femur without acute displaced fracture of the right hip. Nondisplaced fractures of the superior and inferior right pubic rami. No evident fractures of the left hemipelvis or proximal left femur seen in single frontal view. Nonobstructive pattern of overlying bowel gas. IMPRESSION: 1. Nondisplaced fractures of the superior and inferior right pubic rami. Consider CT to further evaluate. 2. Status post right hip total arthroplasty.  Chronic fracture deformity of the underlying proximal right femur without acute displaced fracture of the right hip. 3. No evident fractures of the left hemipelvis or proximal left femur seen in single frontal view. Electronically Signed   By: Jearld Lesch M.D.   On: 03/29/2023 11:43    EKG: Independently reviewed. nsr  Assessment/Plan Principal Problem:   Pubic ramus fracture Active Problems:   Ductal carcinoma in situ (DCIS) of right breast   Osteoporosis   # Pubic ramus fractures # Chronic fracture deformity of underlying proximal right femur Nondisplaced fractures of the superior and inferior right pubic ramus  on x-ray. Chronic fracture deformity underlying site of prior right hip repair. - EDP Dr. Rodena Medin will consult orthopedics, I asked him to do this given the signs of chronic fracture deformity not seen on most recent ortho imaging from 03/07/22 - pain control with home oxy and mobic, add morphine prn - bowel regimen - pt/ot consults, likely needs SNF  # Breast cancer Recently started on anastrozole. - continue anastrozole for now but will appreciate orthopedic's assessment of whether benefits outweigh risks given acute fracture  # Osteoporosis - on prolia q 6 months outpt  # MDD, GAD - cont home wellbutrin, buspar, clonazepam   DVT prophylaxis: lovenox Code Status: full, confirmed w/ patient  Family Communication: none @ bedside  Consults called: orthopedics    Level of care: Med-Surg Status is: Inpatient Remains inpatient appropriate because: unsafe d/c plan    Silvano Bilis MD [facility] Pager [phone]  If 7PM-7AM, please contact night-coverage [email or web address] Password [email or web address]  03/29/2023, 12:44 PM

## 2023-03-29 NOTE — ED Notes (Signed)
ED TO INPATIENT HANDOFF REPORT  ED Nurse Name and Phone #: Delice Bison, RN  S Name/Age/Gender Emily Horton 77 y.o. female Room/Bed: WA19/WA19  Code Status   Code Status: Full Code  Home/SNF/Other Home Patient oriented to: self, place, time, and situation Is this baseline? Yes   Triage Complete: Triage complete  Chief Complaint Pubic ramus fracture [S32.599A]  Triage Note [facility] reports pt coming from home for a fall last night. Pt states she was walking to her room and her feet gave out and she fell landing on her right hip. Pt c/o right hip pain and pt states she has had right hip surgery before. Denies any LOC.   Allergies Allergies  Allergen Reactions   Egg-Derived Products Hives   Zoloft [Sertraline Hcl] Other (See Comments)    Headaches     Level of Care/Admitting Diagnosis ED Disposition     ED Disposition  Admit   Condition  --   Comment  Hospital Area: [facility] [[id number]]  Level of Care: Med-Surg [16]  May admit patient to [facility] or [facility] if equivalent level of care is available:: Yes  Covid Evaluation: Asymptomatic - no recent exposure (last 10 days) testing not required  Diagnosis: Pubic ramus fracture [409811]  Admitting Physician: Erenest Blank  Attending Physician: Erenest Blank  Certification:: I certify this patient will need inpatient services for at least 2 midnights  Estimated Length of Stay: 2          B Medical/Surgery History Past Medical History:  Diagnosis Date   Allergy    SEASONAL   Anxiety    Arthritis    right hip, right shoulder   Blood transfusion without reported diagnosis    "when had hip surgery"   Cancer 07/2022   right breast papillary carcinoma in situ   Colon polyps 09/25/2007   Colonoscopy   COPD (chronic obstructive pulmonary disease)    smoker 1/2ppd   Depression    Diverticulosis of colon (without mention of hemorrhage) 09/25/2007   Colonoscopy   GERD  (gastroesophageal reflux disease)    Gynecological examination    sees Dr. Chevis Pretty    Hyperlipidemia    Osteopenia    Past Surgical History:  Procedure Laterality Date   COLONOSCOPY  12/30/2015   per Dr. Marina Goodell, benign  polyps, repeat in 5  yrs.    ESOPHAGOGASTRODUODENOSCOPY  06/30/2012   per Dr. Jarold Motto, reflux but no Barretts seen    HARDWARE REMOVAL Right 02/01/2020   Procedure: HARDWARE REMOVAL;  Surgeon: Kathryne Hitch, MD;  Location: [facility];  Service: Orthopedics;  Laterality: Right;   INTRAMEDULLARY (IM) NAIL INTERTROCHANTERIC Right 06/22/2019   INTRAMEDULLARY (IM) NAIL INTERTROCHANTERIC Right 06/22/2019   Procedure: INTRAMEDULLARY (IM) NAIL INTERTROCHANTRIC;  Surgeon: Myrene Galas, MD;  Location: [facility];  Service: Orthopedics;  Laterality: Right;   MASTECTOMY W/ SENTINEL NODE BIOPSY Right 09/20/2022   Procedure: RIGHT MASTECTOMY WITH SENTINEL LYMPH NODE BIOPSY;  Surgeon: Griselda Miner, MD;  Location: [facility];  Service: General;  Laterality: Right;   POLYPECTOMY     REVERSE SHOULDER ARTHROPLASTY Right 01/29/2020   Procedure: RIGHT REVERSE SHOULDER ARTHROPLASTY;  Surgeon: Teryl Lucy, MD;  Location: [facility];  Service: Orthopedics;  Laterality: Right;   TOTAL HIP ARTHROPLASTY Right 02/01/2020   Procedure: TOTAL HIP ARTHROPLASTY ANTERIOR APPROACH;  Surgeon: Kathryne Hitch, MD;  Location: [facility];  Service: Orthopedics;  Laterality: Right;     A IV Location/Drains/Wounds Patient  Lines/Drains/Airways Status     Active Line/Drains/Airways     Name Placement date Placement time Site Days   Peripheral IV 03/29/23 20 G 1" Anterior;Left;Proximal Forearm 03/29/23  1055  Forearm  less than 1   Closed System Drain 1 Right Breast Bulb (JP) 19 Fr. 09/20/22  1139  Breast  190   Incision (Closed) 01/29/20 Shoulder Right 01/29/20  1622  -- 1155   Incision (Closed) 02/01/20 Hip Right 02/01/20  1348  -- 1152   Incision (Closed) 09/20/22 Breast Right  09/20/22  1053  -- 190            Intake/Output Last 24 hours No intake or output data in the 24 hours ending 03/29/23 1516  Labs/Imaging Results for orders placed or performed during the hospital encounter of 03/29/23 (from the past 48 hour(s))  CBC with Differential     Status: None   Collection Time: 03/29/23 10:50 AM  Result Value Ref Range   WBC 8.7 4.0 - 10.5 K/uL   RBC 4.33 3.87 - 5.11 MIL/uL   Hemoglobin 13.5 12.0 - 15.0 g/dL   HCT 16.1 09.6 - 04.5 %   MCV 93.1 80.0 - 100.0 fL   MCH 31.2 26.0 - 34.0 pg   MCHC 33.5 30.0 - 36.0 g/dL   RDW 40.9 81.1 - 91.4 %   Platelets 202 150 - 400 K/uL   nRBC 0.0 0.0 - 0.2 %   Neutrophils Relative % 71 %   Neutro Abs 6.2 1.7 - 7.7 K/uL   Lymphocytes Relative 19 %   Lymphs Abs 1.6 0.7 - 4.0 K/uL   Monocytes Relative 8 %   Monocytes Absolute 0.7 0.1 - 1.0 K/uL   Eosinophils Relative 1 %   Eosinophils Absolute 0.1 0.0 - 0.5 K/uL   Basophils Relative 0 %   Basophils Absolute 0.0 0.0 - 0.1 K/uL   Immature Granulocytes 1 %   Abs Immature Granulocytes 0.05 0.00 - 0.07 K/uL    Comment: Performed at [facility], [address] [place], [place] [address]  Comprehensive metabolic panel     Status: Abnormal   Collection Time: 03/29/23 10:50 AM  Result Value Ref Range   Sodium 138 135 - 145 mmol/L   Potassium 3.8 3.5 - 5.1 mmol/L   Chloride 106 98 - 111 mmol/L   CO2 24 22 - 32 mmol/L   Glucose, Bld 108 (H) 70 - 99 mg/dL    Comment: Glucose reference range applies only to samples taken after fasting for at least 8 hours.   BUN 25 (H) 8 - 23 mg/dL   Creatinine, Ser 6.21 (H) 0.44 - 1.00 mg/dL   Calcium 9.6 8.9 - 30.8 mg/dL   Total Protein 7.0 6.5 - 8.1 g/dL   Albumin 4.1 3.5 - 5.0 g/dL   AST 12 (L) 15 - 41 U/L   ALT 10 0 - 44 U/L   Alkaline Phosphatase 83 38 - 126 U/L   Total Bilirubin 1.1 0.3 - 1.2 mg/dL   GFR, Estimated 57 (L) >60 mL/min    Comment: (NOTE) Calculated using the CKD-EPI Creatinine Equation  (2021)    Anion gap 8 5 - 15    Comment: Performed at [facility], [address] [place], [place] [address]  Urinalysis, Routine w reflex microscopic -Urine, Clean Catch     Status: Abnormal   Collection Time: 03/29/23 11:01 AM  Result Value Ref Range   Color, Urine STRAW (A) YELLOW   APPearance  CLEAR CLEAR   Specific Gravity, Urine 1.008 1.005 - 1.030   pH 7.0 5.0 - 8.0   Glucose, UA NEGATIVE NEGATIVE mg/dL   Hgb urine dipstick SMALL (A) NEGATIVE   Bilirubin Urine NEGATIVE NEGATIVE   Ketones, ur NEGATIVE NEGATIVE mg/dL   Protein, ur NEGATIVE NEGATIVE mg/dL   Nitrite NEGATIVE NEGATIVE   Leukocytes,Ua NEGATIVE NEGATIVE   RBC / HPF 0-5 0 - 5 RBC/hpf   WBC, UA 0-5 0 - 5 WBC/hpf   Bacteria, UA NONE SEEN NONE SEEN   Squamous Epithelial / HPF 0-5 0 - 5 /HPF   Mucus PRESENT     Comment: Performed at [facility], [address], [place], [place] [address]  Type and screen [facility]     Status: None   Collection Time: 03/29/23 11:05 AM  Result Value Ref Range   ABO/RH(D) O POS    Antibody Screen NEG    Sample Expiration      04/01/2023,2359 Performed at [facility], [address], [place], [place] [address]    DG Chest Port 1 View  Result Date: 03/29/2023 CLINICAL DATA:  Fall. EXAM: PORTABLE CHEST 1 VIEW COMPARISON:  06/21/2019 FINDINGS: The lungs are clear without focal pneumonia, edema, pneumothorax or pleural effusion. The cardiopericardial silhouette is within normal limits for size. No acute bony abnormality. Telemetry leads overlie the chest. IMPRESSION: No active disease. Electronically Signed   By: Kennith Center M.D.   On: 03/29/2023 11:58   DG Hip Unilat W or Wo Pelvis 2-3 Views Right  Result Date: 03/29/2023 CLINICAL DATA:  Fall pain EXAM: DG HIP (WITH OR WITHOUT PELVIS) 2-3V RIGHT COMPARISON:  03/31/2022 FINDINGS: Osteopenia. Status post right hip total arthroplasty. Chronic fracture  deformity of the underlying proximal right femur without acute displaced fracture of the right hip. Nondisplaced fractures of the superior and inferior right pubic rami. No evident fractures of the left hemipelvis or proximal left femur seen in single frontal view. Nonobstructive pattern of overlying bowel gas. IMPRESSION: 1. Nondisplaced fractures of the superior and inferior right pubic rami. Consider CT to further evaluate. 2. Status post right hip total arthroplasty. Chronic fracture deformity of the underlying proximal right femur without acute displaced fracture of the right hip. 3. No evident fractures of the left hemipelvis or proximal left femur seen in single frontal view. Electronically Signed   By: Jearld Lesch M.D.   On: 03/29/2023 11:43    Pending Labs Unresulted Labs (From admission, onward)    None       Vitals/Pain Today's Vitals   03/29/23 1022 03/29/23 1024 03/29/23 1400 03/29/23 1432  BP:  (!) 144/100 (!) 141/96   Pulse:  92 88   Resp:  18 18   Temp:  98.2 F (36.8 C)  98.4 F (36.9 C)  TempSrc:  Oral  Oral  SpO2:  97% 99%   PainSc: 5        Isolation Precautions No active isolations  Medications Medications  oxyCODONE (Oxy IR/ROXICODONE) immediate release tablet 5-10 mg (has no administration in time range)  meloxicam (MOBIC) tablet 15 mg (has no administration in time range)  anastrozole (ARIMIDEX) tablet 1 mg (has no administration in time range)  buPROPion (WELLBUTRIN XL) 24 hr tablet 300 mg (has no administration in time range)  busPIRone (BUSPAR) tablet 15 mg (has no administration in time range)  polyethylene glycol (MIRALAX / GLYCOLAX) packet 34 g (has no administration in time range)  sennosides-docusate sodium (SENOKOT-S)  8.6-50 MG tablet 2 tablet (has no administration in time range)  clonazePAM (KLONOPIN) tablet 0.5 mg (has no administration in time range)  loratadine (CLARITIN) tablet 10 mg (has no administration in time range)  morphine (PF) 2  MG/ML injection 2 mg (has no administration in time range)  enoxaparin (LOVENOX) injection 40 mg (has no administration in time range)  sodium chloride flush (NS) 0.9 % injection 3 mL (has no administration in time range)  sodium chloride flush (NS) 0.9 % injection 3 mL (has no administration in time range)  0.9 %  sodium chloride infusion (has no administration in time range)  morphine (PF) 4 MG/ML injection 4 mg (4 mg Intravenous Given 03/29/23 1055)    Mobility walks     Focused Assessments Cardiac Assessment Handoff:    No results found for: "CKTOTAL", "CKMB", "CKMBINDEX", "TROPONINI" Lab Results  Component Value Date   DDIMER 1.12 (H) 07/06/2016   Does the Patient currently have chest pain? No    R Recommendations: See Admitting Provider Note  Report given to:   Additional Notes:

## 2023-03-29 NOTE — ED Triage Notes (Signed)
[facility] reports pt coming from home for a fall last night. Pt states she was walking to her room and her feet gave out and she fell landing on her right hip. Pt c/o right hip pain and pt states she has had right hip surgery before. Denies any LOC.

## 2023-03-29 NOTE — Consult Note (Signed)
Orthopedic Surgery Consult Note  Assessment: Patient is a 77 y.o. female with pelvic ring injury   Plan: -No acute operative intervention -Okay for diet from orthopedic perspective -DVT ppx: per primary -Weight bearing status: as tolerated -PT evaluate and treat -Pain control -Dispo: per primary -Follow up with orthopedics in 2 weeks  ___________________________________________________________________________   Reason for consult: pubic rami fractures  History:  Patient is a 77 y.o. female who ambulates with a walker and had a fall yesterday. States one of her legs gave out which caused her to fall. Felt hip pain after the fall and had difficulty ambulating. Was able to ambulate with pain. Pain is well controlled now. No pain besides in her right hip/groin.     Physical Exam:  General: no acute distress, appears stated age Neurologic: alert, answering questions appropriately, following commands Cardiovascular: regular rate, no cyanosis Respiratory: unlabored breathing on room air, symmetric chest rise Psychiatric: appropriate affect, normal cadence to speech  MSK:   -Bilateral upper extremities  No tenderness to palpation over extremity, no gross deformity Fires deltoid, biceps, triceps, wrist extensors, wrist flexors, finger extensors, finger flexors  AIN/PIN/IO intact  Palpable radial pulse  Sensation intact to light touch in median/ulnar/radial/axillary nerve distributions  Hand warm and well perfused  -Left lower extremity  No tenderness to palpation over extremity, no gross deformity. No breaks in the skin. No pain with log roll.  Fires hip flexors, quadriceps, hamstrings, tibialis anterior, gastrocnemius and soleus, extensor hallucis longus Plantarflexes and dorsiflexes toes Sensation intact to light touch in sural, saphenous, tibial, deep peroneal, and superficial peroneal nerve distributions Foot warm and well perfused  -Right lower extremity  No tenderness  to palpation over extremity, except over the hip. No gross deformity. No breaks in the skin. Pain with log roll.  Fires hip flexors, quadriceps, hamstrings, tibialis anterior, gastrocnemius and soleus, extensor hallucis longus Plantarflexes and dorsiflexes toes Sensation intact to light touch in sural, saphenous, tibial, deep peroneal, and superficial peroneal nerve distributions Foot warm and well perfused  Imaging: CT of the pelvis from 03/29/2023 was independently reviewed and interpreted, showing nondisplaced rami fractures and a nondisplaced sacral ala fracture. No other fracture seen.    Patient name: Emily Horton Patient MRN: [id number] Date: 03/29/23

## 2023-03-29 NOTE — Progress Notes (Signed)
Patient arrived to room 1533 in NAD, VS stable. Patient oriented to room and call bell in reach.

## 2023-03-29 NOTE — ED Provider Notes (Addendum)
[place] EMERGENCY DEPARTMENT AT [facility] Provider Note   CSN: [id number] Arrival date & time: 03/29/23  1011     History  Chief Complaint  Patient presents with   Fall    Emily Horton is a 77 y.o. female.  77 year old female with prior medical history as detailed below presents for evaluation.  Patient reports mechanical fall yesterday in her residence.  She landed hard on her right hip and right buttock.  Patient reports that since the fall she has been unable to ambulate secondary to pain to the right groin and right hip.  She called EMS for assistance late last night but declined transport.  This morning when she was still unable to walk secondary to pain she decided to come to the ED for evaluation.  She denies head injury.  She denies LOC.  She denies neck pain.  She denies chest pain or shortness of breath.  She reports that she uses a walker at baseline.  She reports that she lives at home by herself.  Occasionally her daughter will check on her.  The history is provided by the patient and medical records.       Home Medications Prior to Admission medications   Medication Sig Start Date End Date Taking? Authorizing Provider  anastrozole (ARIMIDEX) 1 MG tablet Take 1 tablet (1 mg total) by mouth daily. 10/14/22   Rachel Moulds, MD  buPROPion (WELLBUTRIN XL) 300 MG 24 hr tablet TAKE 1 TABLET(300 MG) BY MOUTH EVERY MORNING 09/23/22   [name], MD  busPIRone (BUSPAR) 15 MG tablet TAKE 1 TABLET(15 MG) BY MOUTH TWICE DAILY 02/14/23   [name], MD  cetirizine (ZYRTEC) 10 MG tablet TAKE 1 TABLET(10 MG) BY MOUTH DAILY 09/23/22   [name], MD  clonazePAM (KLONOPIN) 0.5 MG tablet Take 1 tablet (0.5 mg total) by mouth 3 (three) times daily as needed for anxiety. 10/05/22   [name], MD  denosumab (PROLIA) 60 MG/ML SOSY injection Inject 60 mg into the skin every 6 (six) months.    [provider]  EPINEPHrine 0.3 mg/0.3 mL IJ  SOAJ injection Inject 0.3 mLs (0.3 mg total) into the muscle daily as needed for anaphylaxis. 05/27/20   Mahlon Gammon, MD  meloxicam (MOBIC) 15 MG tablet TAKE 1 TABLET(15 MG) BY MOUTH DAILY 12/30/22   [name], MD  Menthol, Topical Analgesic, (BIOFREEZE) 4 % GEL Apply topically. APPLY TO RIGHT SHOULDER Twice A Day - PRN    [provider]  methocarbamol (ROBAXIN) 500 MG tablet Take 1 tablet (500 mg total) by mouth every 6 (six) hours as needed for muscle spasms. 09/21/22   Griselda Miner, MD  omeprazole (PRILOSEC) 40 MG capsule Take 1 capsule (40 mg total) by mouth 2 (two) times daily. 12/11/21   [name], MD  ondansetron (ZOFRAN) 4 MG tablet Take 1 tablet (4 mg total) by mouth every 8 (eight) hours as needed for nausea or vomiting. 01/29/20   Janine Ores K, PA-C  oxyCODONE (OXY IR/ROXICODONE) 5 MG immediate release tablet Take 1-2 tablets (5-10 mg total) by mouth every 6 (six) hours as needed for moderate pain. 09/21/22   Chevis Pretty III, MD  polyethylene glycol (MIRALAX / GLYCOLAX) 17 g packet Take 17 g by mouth daily as needed for moderate constipation or severe constipation. 06/26/19   Glade Lloyd, MD  sennosides-docusate sodium (SENOKOT-S) 8.6-50 MG tablet Take 2 tablets by mouth daily.    [provider]  triamcinolone cream (KENALOG) 0.1 % Apply 1 application topically 2 (two) times daily. 06/19/19   [name], MD      Allergies    Egg-derived products and Zoloft [sertraline hcl]    Review of Systems   Review of Systems  All other systems reviewed and are negative.   Physical Exam Updated Vital Signs BP (!) 144/100 (BP Location: Right Arm)   Pulse 92   Temp 98.2 F (36.8 C) (Oral)   Resp 18   SpO2 97%  Physical Exam Vitals and nursing note reviewed.  Constitutional:      General: She is not in acute distress.    Appearance: Normal appearance. She is well-developed.  HENT:     Head: Normocephalic and atraumatic.  Eyes:      Conjunctiva/sclera: Conjunctivae normal.     Pupils: Pupils are equal, round, and reactive to light.  Cardiovascular:     Rate and Rhythm: Normal rate and regular rhythm.     Heart sounds: Normal heart sounds.  Pulmonary:     Effort: Pulmonary effort is normal. No respiratory distress.     Breath sounds: Normal breath sounds.  Abdominal:     General: There is no distension.     Palpations: Abdomen is soft.     Tenderness: There is no abdominal tenderness.  Musculoskeletal:        General: Tenderness present. No deformity. Normal range of motion.     Cervical back: Normal range of motion and neck supple.     Comments: Maximal tenderness localized to the right groin and right posterior hip.  Distal right lower extremity is without shortening or rotation.  Distal right lower extremity is neurovascular intact.  Skin:    General: Skin is warm and dry.  Neurological:     General: No focal deficit present.     Mental Status: She is alert and oriented to person, place, and time.     ED Results / Procedures / Treatments   Labs (all labs ordered are listed, but only abnormal results are displayed) Labs Reviewed  COMPREHENSIVE METABOLIC PANEL - Abnormal; Notable for the following components:      Result Value   Glucose, Bld 108 (*)    BUN 25 (*)    Creatinine, Ser 1.03 (*)    AST 12 (*)    GFR, Estimated 57 (*)    All other components within normal limits  URINALYSIS, ROUTINE W REFLEX MICROSCOPIC - Abnormal; Notable for the following components:   Color, Urine STRAW (*)    Hgb urine dipstick SMALL (*)    All other components within normal limits  CBC WITH DIFFERENTIAL/PLATELET  TYPE AND SCREEN    EKG EKG Interpretation  Date/Time:  Tuesday March 29 2023 11:00:38 EDT Ventricular Rate:  95 PR Interval:  133 QRS Duration: 96 QT Interval:  369 QTC Calculation: 464 R Axis:   -44 Text Interpretation: Sinus rhythm Left axis deviation Low voltage, precordial leads Confirmed by  Kristine Royal [phone]) on 03/29/2023 11:12:49 AM  Radiology DG Chest Port 1 View  Result Date: 03/29/2023 CLINICAL DATA:  Fall. EXAM: PORTABLE CHEST 1 VIEW COMPARISON:  06/21/2019 FINDINGS: The lungs are clear without focal pneumonia, edema, pneumothorax or pleural effusion. The cardiopericardial silhouette is within normal limits for size. No acute bony abnormality. Telemetry leads overlie the chest. IMPRESSION: No active disease. Electronically Signed   By: Kennith Center M.D.   On: 03/29/2023 11:58   DG Hip Unilat W or  Wo Pelvis 2-3 Views Right  Result Date: 03/29/2023 CLINICAL DATA:  Fall pain EXAM: DG HIP (WITH OR WITHOUT PELVIS) 2-3V RIGHT COMPARISON:  03/31/2022 FINDINGS: Osteopenia. Status post right hip total arthroplasty. Chronic fracture deformity of the underlying proximal right femur without acute displaced fracture of the right hip. Nondisplaced fractures of the superior and inferior right pubic rami. No evident fractures of the left hemipelvis or proximal left femur seen in single frontal view. Nonobstructive pattern of overlying bowel gas. IMPRESSION: 1. Nondisplaced fractures of the superior and inferior right pubic rami. Consider CT to further evaluate. 2. Status post right hip total arthroplasty. Chronic fracture deformity of the underlying proximal right femur without acute displaced fracture of the right hip. 3. No evident fractures of the left hemipelvis or proximal left femur seen in single frontal view. Electronically Signed   By: Jearld Lesch M.D.   On: 03/29/2023 11:43    Procedures Procedures    Medications Ordered in ED Medications  morphine (PF) 4 MG/ML injection 4 mg (4 mg Intravenous Given 03/29/23 1055)    ED Course/ Medical Decision Making/ A&P                             Medical Decision Making Amount and/or Complexity of Data Reviewed Labs: ordered. Radiology: ordered.  Risk Prescription drug management. Decision regarding  hospitalization.    Medical Screen Complete  This patient presented to the ED with complaint of fall, right hip pain.  This complaint involves an extensive number of treatment options. The initial differential diagnosis includes, but is not limited to, trauma from fall  This presentation is: Acute, Self-Limited, Previously Undiagnosed, Uncertain Prognosis, Complicated, and Systemic Symptoms  Patient presents after mechanical fall that occurred yesterday.  Patient has been unable to bear weight since the fall occurred.  She uses a walker at baseline.  Patient with significant pain to the anterior right groin and posterior right hip.  Imaging reveals right-sided superior and inferior pubic rami fractures.  Patient with inability to safely ambulate after acute fall and fracture of pubic rami on the right.  Patient would benefit from admission for pain control and physical therapy.  Hospitalist service made aware of case and will evaluate for admission.  Dr. Christell Constant with [facility] aware of case and will consult.  Additional history obtained:  External records from outside sources obtained and reviewed including prior ED visits and prior Inpatient records.    Lab Tests:  I ordered and personally interpreted labs.  The pertinent results include: CBC, CMP, UA, type and screen   Imaging Studies ordered:  I ordered imaging studies including films of right hip and pelvis, chest x-ray I independently visualized and interpreted obtained imaging which showed acute fracture of right-sided superior and inferior pubic rami I agree with the radiologist interpretation.   Cardiac Monitoring:  The patient was maintained on a cardiac monitor.  I personally viewed and interpreted the cardiac monitor which showed an underlying rhythm of: NSR   Medicines ordered:  I ordered medication including morphine for pain Reevaluation of the patient after these medicines showed that the patient:  improved    Problem List / ED Course:  Fall, right superior and inferior pubic rami fracture   Reevaluation:  After the interventions noted above, I reevaluated the patient and found that they have: improved   Disposition:  After consideration of the diagnostic results and the patients response to treatment, I feel that the  patent would benefit from admission.          Final Clinical Impression(s) / ED Diagnoses Final diagnoses:  Fall, initial encounter  Closed fracture of multiple rami of right pubis, initial encounter    Rx / DC Orders ED Discharge Orders     None         Wynetta Fines, MD 03/29/23 1223    Wynetta Fines, MD 03/29/23 1250

## 2023-03-30 DIAGNOSIS — S3210XA Unspecified fracture of sacrum, initial encounter for closed fracture: Secondary | ICD-10-CM

## 2023-03-30 DIAGNOSIS — S32591A Other specified fracture of right pubis, initial encounter for closed fracture: Secondary | ICD-10-CM | POA: Diagnosis not present

## 2023-03-30 MED ORDER — OXYCODONE HCL 5 MG PO TABS: 15.0000 mg | ORAL_TABLET | Freq: Four times a day (QID) | ORAL | Status: DC | PRN

## 2023-03-30 MED ORDER — METHOCARBAMOL 1000 MG/10ML IJ SOLN: 500.0000 mg | Freq: Three times a day (TID) | INTRAVENOUS | Status: DC

## 2023-03-30 MED ORDER — OXYCODONE HCL 5 MG PO TABS
5.0000 mg | ORAL_TABLET | Freq: Four times a day (QID) | ORAL | Status: DC | PRN
  Administered 2023-03-30 – 2023-04-04 (×8): 10 mg via ORAL
  Filled 2023-03-30 (×8): qty 2

## 2023-03-30 NOTE — Progress Notes (Signed)
PROGRESS NOTE    [name]  ZOX:[id number] DOB: Mar 24, 1946 DOA: 03/29/2023 PCP: [name], MD    Brief Narrative:   Emily Horton is a 77 y.o. female with past medical history significant for breast cancer, osteoporosis, history of hip fracture, MDD/GAD who presented to [facility] ED on 4/23 from home via EMS following mechanical fall at home.  Patient reports was walking to her room in which her feet gave out and fell on her right hip.  Patient complaining of right hip pain.  Denies loss of consciousness.  Patient lives alone and utilizes a walker for ambulation at baseline.  Patient reports very painful to walk following a fall.  No other specific complaints.  Denied headache, no dizziness, no chest pain, no palpitations, no shortness of breath, no abdominal pain.  In the ED, temperature 98.2 F, HR 92, RR 18, BP 144/100, SpO2 97% on room air.  WBC 8.7, hemoglobin 13.5, platelets 202.  Sodium 138, potassium 3.8, chloride 106, CO2 24, glucose 108, BUN 25, creatinine 1.03.  AST 12, ALT 10, total bilirubin 1.5.  Urinalysis unrevealing.  Chest x-ray with no active cardiopulmonary disease process.  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  Right femur with subtle fracture involving the inferior pubic rami on the right, screw fragment along the distal femoral metaphysis, noted right hip arthroplasty.  CT pelvis without contrast with nondisplaced superior and inferior pubic rami fracture on right side, small nondisplaced right sacral fractures anteriorly, intact right hip prosthesis.  Was seen by orthopedics, Dr. Christell Constant; with no recommendation of need for operative intervention with recommendation of weightbearing as tolerates and therapy  evaluation.  [facility] was consulted for admission for further evaluation management of right pubic rami/sacral fracture.  Assessment & Plan:   Right pubic rami/sacral fracture Patient presenting to ED follow mechanical fall at home with right sided hip pain.  Imaging notable for nondisplaced superior/inferior pubic rami fracture on the right and small nondisplaced right sacral fracture.  Evaluated by orthopedics, Dr. Christell Constant; no surgical intervention required with recommendation of weightbearing as tolerates, pain control and therapy evaluation with outpatient follow-up in 2 weeks. --WBAT BLE -- Oxycodone 5-10 mg PO q6h PRN moderate pain -- Morphine 2 mg IV every 4 hours as needed severe pain -- PT/OT currently recommending SNF placement, TOC consulted -- Continue therapy efforts while inpatient  Major depressive disorder/general anxiety disorder -- BuSpar 7.5 mg p.o. daily -- Wellbutrin 300 mg p.o. daily -- Clonazepam 0.5 mg p.o. 3 times daily as needed anxiety   DVT prophylaxis: enoxaparin (LOVENOX) injection 40 mg Start: 03/29/23 2200    Code Status: Full Code Family Communication: No family present at bedside this morning  Disposition Plan:  Level of care: Med-Surg Status is: Inpatient Remains inpatient appropriate because: Pending SNF placement    Consultants:  Orthopedics, Dr. Christell Constant  Procedures:  None  Antimicrobials:  None   Subjective: Patient seen examined bedside, resting comfortably.  Lying in bed.  Continues with pain to her pelvis/right hip.  Awaiting PT/OT evaluation today.  Discussed with patient likely will need SNF placement.  No other specific questions or concerns at this time.  Denies headache, no dizziness, no chest pain, no palpitations, no fever/chills/night sweats, no nausea/vomiting/diarrhea, no cough/congestion, no abdominal pain, no  focal weakness, no fatigue, no paresthesias.  No acute events overnight per nursing staff.  Objective: Vitals:   03/29/23  2032 03/30/23 0041 03/30/23 0551 03/30/23 1219  BP: 118/75 118/63 101/72 121/64  Pulse: 82 86 83 91  Resp: 17 17 17 19   Temp: 98.4 F (36.9 C) 98.5 F (36.9 C) 98.2 F (36.8 C) 98.3 F (36.8 C)  TempSrc:  Oral  Oral  SpO2: 97% 94% 92% 95%    Intake/Output Summary (Last 24 hours) at 03/30/2023 1406 Last data filed at 03/29/2023 2134 Gross per 24 hour  Intake 3 ml  Output --  Net 3 ml   There were no vitals filed for this visit.  Examination:  Physical Exam: GEN: NAD, alert and oriented x 3, wd/wn HEENT: NCAT, PERRL, EOMI, sclera clear, MMM PULM: CTAB w/o wheezes/crackles, normal respiratory effort, on room air CV: RRR w/o M/G/R GI: abd soft, NTND, NABS, no R/G/M MSK: no peripheral edema, muscle strength globally intact 5/5 bilateral upper/lower extremities NEURO: CN II-XII intact, no focal deficits, sensation to light touch intact PSYCH: normal mood/affect Integumentary: dry/intact, no rashes or wounds    Data Reviewed: I have personally reviewed following labs and imaging studies  CBC: Recent Labs  Lab 03/29/23 1050  WBC 8.7  NEUTROABS 6.2  HGB 13.5  HCT 40.3  MCV 93.1  PLT 202   Basic Metabolic Panel: Recent Labs  Lab 03/29/23 1050  NA 138  K 3.8  CL 106  CO2 24  GLUCOSE 108*  BUN 25*  CREATININE 1.03*  CALCIUM 9.6   GFR: CrCl cannot be calculated (Unknown ideal weight.). Liver Function Tests: Recent Labs  Lab 03/29/23 1050  AST 12*  ALT 10  ALKPHOS 83  BILITOT 1.1  PROT 7.0  ALBUMIN 4.1   No results for input(s): "LIPASE", "AMYLASE" in the last 168 hours. No results for input(s): "AMMONIA" in the last 168 hours. Coagulation Profile: No results for input(s): "INR", "PROTIME" in the last 168 hours. Cardiac Enzymes: No results for input(s): "CKTOTAL", "CKMB", "CKMBINDEX", "TROPONINI" in the last 168 hours. BNP (last 3 results) No results for input(s): "PROBNP" in the last 8760 hours. HbA1C: No results for input(s): "HGBA1C" in the last  72 hours. CBG: No results for input(s): "GLUCAP" in the last 168 hours. Lipid Profile: No results for input(s): "CHOL", "HDL", "LDLCALC", "TRIG", "CHOLHDL", "LDLDIRECT" in the last 72 hours. Thyroid Function Tests: No results for input(s): "TSH", "T4TOTAL", "FREET4", "T3FREE", "THYROIDAB" in the last 72 hours. Anemia Panel: No results for input(s): "VITAMINB12", "FOLATE", "FERRITIN", "TIBC", "IRON", "RETICCTPCT" in the last 72 hours. Sepsis Labs: No results for input(s): "PROCALCITON", "LATICACIDVEN" in the last 168 hours.  No results found for this or any previous visit (from the past 240 hour(s)).       Radiology Studies: DG FEMUR, MIN 2 VIEWS RIGHT  Result Date: 03/29/2023 CLINICAL DATA:  Pain EXAM: RIGHT FEMUR 2 VIEWS COMPARISON:  Pelvic CT 03/29/2023.  Older x-rays FINDINGS: Right hip arthroplasty identified with screw fixated acetabular cup and Press-Fit components. Underlying osteopenia. No dislocation. There is some deformity of the right inferior pubic ramus consistent with known injury. The superior pubic ramus fracture is not well seen on this x-ray. No hardware failure. There is also a focal screw fragment along the distal femoral metaphysis with some benign periosteal reaction. IMPRESSION: Right hip arthroplasty. Subtle fracture involving the inferior pubic ramus on the right. The superior pubic ramus fracture by CT is not well seen on this x-ray. Screw fragment along  the distal femoral metaphysis. Please correlate with clinical history Electronically Signed   By: Karen Kays M.D.   On: 03/29/2023 18:39   CT PELVIS WO CONTRAST  Result Date: 03/29/2023 CLINICAL DATA:  Larey Seat.  Possible pelvic fractures. EXAM: CT PELVIS WITHOUT CONTRAST TECHNIQUE: Multidetector CT imaging of the pelvis was performed following the standard protocol without intravenous contrast. RADIATION DOSE REDUCTION: This exam was performed according to the departmental dose-optimization program which includes  automated exposure control, adjustment of the mA and/or kV according to patient size and/or use of iterative reconstruction technique. COMPARISON:  09/03/2015 FINDINGS: Both hips are normally located. The right hip prosthesis is intact. No periprosthetic fracture. There are nondisplaced superior and inferior pubic rami fractures on the right side as suggested the radiographs. The pubic symphysis and SI joints are intact. Small nondisplaced right sacral fractures are noted anteriorly. The hip and pelvic musculature are grossly normal by CT. There are surgical changes noted in the subcutaneous fat overlying the right hip. No significant intrapelvic abnormalities are identified. IMPRESSION: 1. Nondisplaced superior and inferior pubic rami fractures on the right side as suggested on the radiographs. 2. Small nondisplaced right sacral fractures anteriorly. 3. Intact right hip prosthesis. Electronically Signed   By: Rudie Meyer M.D.   On: 03/29/2023 16:39   DG Chest Port 1 View  Result Date: 03/29/2023 CLINICAL DATA:  Fall. EXAM: PORTABLE CHEST 1 VIEW COMPARISON:  06/21/2019 FINDINGS: The lungs are clear without focal pneumonia, edema, pneumothorax or pleural effusion. The cardiopericardial silhouette is within normal limits for size. No acute bony abnormality. Telemetry leads overlie the chest. IMPRESSION: No active disease. Electronically Signed   By: Kennith Center M.D.   On: 03/29/2023 11:58   DG Hip Unilat W or Wo Pelvis 2-3 Views Right  Result Date: 03/29/2023 CLINICAL DATA:  Fall pain EXAM: DG HIP (WITH OR WITHOUT PELVIS) 2-3V RIGHT COMPARISON:  03/31/2022 FINDINGS: Osteopenia. Status post right hip total arthroplasty. Chronic fracture deformity of the underlying proximal right femur without acute displaced fracture of the right hip. Nondisplaced fractures of the superior and inferior right pubic rami. No evident fractures of the left hemipelvis or proximal left femur seen in single frontal view.  Nonobstructive pattern of overlying bowel gas. IMPRESSION: 1. Nondisplaced fractures of the superior and inferior right pubic rami. Consider CT to further evaluate. 2. Status post right hip total arthroplasty. Chronic fracture deformity of the underlying proximal right femur without acute displaced fracture of the right hip. 3. No evident fractures of the left hemipelvis or proximal left femur seen in single frontal view. Electronically Signed   By: Jearld Lesch M.D.   On: 03/29/2023 11:43        Scheduled Meds:  anastrozole  1 mg Oral Daily   buPROPion  300 mg Oral Daily   busPIRone  7.5 mg Oral Daily   enoxaparin (LOVENOX) injection  40 mg Subcutaneous Q24H   loratadine  10 mg Oral Daily   meloxicam  15 mg Oral Daily   polyethylene glycol  34 g Oral Daily   senna-docusate  2 tablet Oral Daily   sodium chloride flush  3 mL Intravenous Q12H   Continuous Infusions:  sodium chloride       LOS: 1 day    Time spent: 50 minutes spent on chart review, discussion with nursing staff, consultants, updating family and interview/physical exam; more than 50% of that time was spent in counseling and/or coordination of care.    Alvira Philips [place], DO [facility] Available via Epic secure chat 7am-7pm After these hours, please refer to coverage provider listed on [email or web address] 03/30/2023, 2:06 PM

## 2023-03-30 NOTE — [place] FL2 (Signed)
[place] MEDICAID FL2 LEVEL OF CARE FORM     IDENTIFICATION  Patient Name: Emily Horton Birthdate: 1946/07/07 Sex: female Admission Date (Current Location): 03/29/2023  [facility] and [place] Number:  Producer, television/film/video and Address:  [facility],  [address] [place], [place] [address]      Provider Number: [id number]  Attending Physician Name and Address:  [place], Eric J, DO  Relative Name and Phone Number:  Renne Crigler [phone]    Current Level of Care: Hospital Recommended Level of Care: Skilled Nursing Facility Prior Approval Number:    Date Approved/Denied:   PASRR Number: [id number]  Discharge Plan: SNF    Current Diagnoses: Patient Active Problem List   Diagnosis Date Noted   Pubic ramus fracture 03/29/2023   Osteoporosis 03/29/2023   Fall 03/29/2023   Frequent falls 11/22/2022   Malignant neoplasm of overlapping sites of right breast in female, estrogen receptor positive 10/05/2022   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   Generalized anxiety disorder 01/13/2021   Osteoarthritis, multiple sites 04/30/2020   Pneumonia 03/11/2020   Muscle spasm 03/10/2020   Right flank pain 03/10/2020   Right shoulder pain 02/08/2020   Right hip pain 02/08/2020   Blood loss anemia 02/08/2020   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, right    Retained orthopedic hardware    Closed fracture of right proximal humerus 01/29/2020   Closed right hip fracture, initial encounter 06/22/2019   Environmental and seasonal allergies 06/12/2018   Dyslipidemia 08/26/2017   IBS (irritable bowel syndrome) 08/26/2017   Angio-edema 11/03/2016   Diverticulosis of colon without hemorrhage 09/30/2015   Depression, recurrent 08/22/2007   GERD 07/04/2007    Orientation RESPIRATION BLADDER Height & Weight     Self, Time, Situation, Place  Normal  Continent Weight:   Height:     BEHAVIORAL SYMPTOMS/MOOD NEUROLOGICAL BOWEL NUTRITION STATUS      Continent Diet (See discharge summary)  AMBULATORY STATUS COMMUNICATION OF NEEDS Skin   Limited Assist Verbally Normal                       Personal Care Assistance Level of Assistance  Bathing, Feeding, Dressing Bathing Assistance: Maximum assistance Feeding assistance: Limited assistance Dressing Assistance: Maximum assistance     Functional Limitations Info  Sight, Hearing, Speech Sight Info: Adequate Hearing Info: Adequate Speech Info: Adequate    SPECIAL CARE FACTORS FREQUENCY  PT (By licensed PT), OT (By licensed OT)     PT Frequency: 5x/wk OT Frequency: 5x/wk            Contractures Contractures Info: Not present    Additional Factors Info  Code Status, Allergies Code Status Info: FULL Allergies Info: Egg-derived Products, Zoloft (Sertraline Hcl)           Current Medications (03/30/2023):  This is the current hospital active medication list Current Facility-Administered Medications  Medication Dose Route Frequency Provider Last Rate Last Admin   0.9 %  sodium chloride infusion  250 mL Intravenous PRN Wouk, Wilfred Curtis, MD       anastrozole (ARIMIDEX) tablet 1 mg  1 mg Oral Daily Wouk, Wilfred Curtis, MD   1 mg at 03/29/23 1815   buPROPion (WELLBUTRIN XL) 24 hr tablet 300 mg  300 mg Oral Daily Kathrynn Running, MD   300 mg at 03/30/23 1034   busPIRone (BUSPAR) tablet  7.5 mg  7.5 mg Oral Daily Wouk, Wilfred Curtis, MD   7.5 mg at 03/30/23 1028   clonazePAM (KLONOPIN) tablet 0.5 mg  0.5 mg Oral TID PRN Kathrynn Running, MD   0.5 mg at 03/29/23 2134   enoxaparin (LOVENOX) injection 40 mg  40 mg Subcutaneous Q24H Kathrynn Running, MD   40 mg at 03/29/23 2134   loratadine (CLARITIN) tablet 10 mg  10 mg Oral Daily Kathrynn Running, MD   10 mg at 03/30/23 1034   meloxicam (MOBIC) tablet 15 mg  15 mg Oral Daily Kathrynn Running, MD   15 mg at 03/30/23 1034    morphine (PF) 2 MG/ML injection 2 mg  2 mg Intravenous Q4H PRN Kathrynn Running, MD   2 mg at 03/30/23 1041   ondansetron (ZOFRAN) injection 4 mg  4 mg Intravenous Q6H PRN Kathrynn Running, MD   4 mg at 03/29/23 1644   oxyCODONE (Oxy IR/ROXICODONE) immediate release tablet 5-10 mg  5-10 mg Oral Q6H PRN [place], Eric J, DO       polyethylene glycol (MIRALAX / GLYCOLAX) packet 34 g  34 g Oral Daily Kathrynn Running, MD   34 g at 03/30/23 1033   senna-docusate (Senokot-S) tablet 2 tablet  2 tablet Oral Daily Kathrynn Running, MD   2 tablet at 03/30/23 1033   sodium chloride flush (NS) 0.9 % injection 3 mL  3 mL Intravenous Q12H Wouk, Wilfred Curtis, MD   3 mL at 03/30/23 1041   sodium chloride flush (NS) 0.9 % injection 3 mL  3 mL Intravenous PRN Wouk, Wilfred Curtis, MD         Discharge Medications: Please see discharge summary for a list of discharge medications.  Relevant Imaging Results:  Relevant Lab Results:   Additional Information SSN: [id number]  Otelia Santee, LCSW

## 2023-03-30 NOTE — TOC Initial Note (Addendum)
Transition of Care [facility]) - Initial/Assessment Note    Patient Details  Name: Emily Horton MRN: [id number] Date of Birth: 1946-03-31  Transition of Care [facility]) CM/SW Contact:    Otelia Santee, LCSW Phone Number: 03/30/2023, 11:49 AM  Clinical Narrative:                 Pt from home alone. Typically ambulates with RW. Met with pt and confirmed plan for SNF placement. Pt shares she has been to several SNF's in the past and her preference would be for placement at [facility].  Referrals have been sent for SNF placement. Currently awaiting bed offers. Patient has traditional MCR and will require 3 midnight stay in order for insurance to cover SNF.   Expected Discharge Plan: Skilled Nursing Facility Barriers to Discharge: No Barriers Identified   Patient Goals and CMS Choice Patient states their goals for this hospitalization and ongoing recovery are:: To go to SNF CMS Medicare.gov Compare Post Acute Care list provided to:: Patient Choice offered to / list presented to : Patient [place] ownership interest in [facility].provided to:: Patient    Expected Discharge Plan and Services In-house Referral: Clinical Social Work Discharge Planning Services: NA Post Acute Care Choice: Skilled Nursing Facility Living arrangements for the past 2 months: Single Family Home                 DME Arranged: N/A DME Agency: NA                  Prior Living Arrangements/Services Living arrangements for the past 2 months: Single Family Home Lives with:: Self Patient language and need for interpreter reviewed:: Yes Do you feel safe going back to the place where you live?: Yes      Need for Family Participation in Patient Care: No (Comment) Care giver support system in place?: No (comment) Current home services: DME (RW) Criminal Activity/Legal Involvement Pertinent to Current Situation/Hospitalization: No - Comment as needed  Activities of Daily Living Home Assistive  Devices/Equipment: Environmental consultant (specify type) ADL Screening (condition at time of admission) Patient's cognitive ability adequate to safely complete daily activities?: Yes Is the patient deaf or have difficulty hearing?: No Does the patient have difficulty seeing, even when wearing glasses/contacts?: No Does the patient have difficulty concentrating, remembering, or making decisions?: No Patient able to express need for assistance with ADLs?: Yes Does the patient have difficulty dressing or bathing?: No Independently performs ADLs?: No Communication: Independent Dressing (OT): Independent Grooming: Independent Feeding: Independent Bathing: Independent Toileting: Independent In/Out Bed: Independent Walks in Home: Independent Does the patient have difficulty walking or climbing stairs?: No Weakness of Legs: None Weakness of Arms/Hands: None  Permission Sought/Granted Permission sought to share information with : Facility Medical sales representative, Family Supports Permission granted to share information with : Yes, Verbal Permission Granted  Share Information with NAME: Mindy Malen Gauze and Ciera Beckum  Permission granted to share info w AGENCY: SNF's  Permission granted to share info w Relationship: Daughter and son  Permission granted to share info w Contact Information: [phone] and [phone]  Emotional Assessment Appearance:: Appears stated age Attitude/Demeanor/Rapport: Engaged Affect (typically observed): Accepting Orientation: : Oriented to Self, Oriented to Place, Oriented to  Time, Oriented to Situation Alcohol / Substance Use: Not Applicable Psych Involvement: No (comment)  Admission diagnosis:  Pubic ramus fracture [S32.599A] Fall, initial encounter [W19.XXXA] Closed fracture of multiple rami of right pubis, initial encounter [S32.591A] Patient Active Problem List   Diagnosis Date Noted  Pubic ramus fracture 03/29/2023   Osteoporosis 03/29/2023   Fall 03/29/2023    Frequent falls 11/22/2022   Malignant neoplasm of overlapping sites of right breast in female, estrogen receptor positive 10/05/2022   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   Generalized anxiety disorder 01/13/2021   Osteoarthritis, multiple sites 04/30/2020   Pneumonia 03/11/2020   Muscle spasm 03/10/2020   Right flank pain 03/10/2020   Right shoulder pain 02/08/2020   Right hip pain 02/08/2020   Blood loss anemia 02/08/2020   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, right    Retained orthopedic hardware    Closed fracture of right proximal humerus 01/29/2020   Closed right hip fracture, initial encounter 06/22/2019   Environmental and seasonal allergies 06/12/2018   Dyslipidemia 08/26/2017   IBS (irritable bowel syndrome) 08/26/2017   Angio-edema 11/03/2016   Diverticulosis of colon without hemorrhage 09/30/2015   Depression, recurrent 08/22/2007   GERD 07/04/2007   PCP:  [name], MD Pharmacy:   [facility] [phone] Ginette Otto,  - [address] AT [facility] [address] [facility] [address] [place] [address] Phone: [phone] Fax: [phone]  [facility]- - [place], [place] - [address] [place] [address] Phone: [phone] Fax: [phone]     Social Determinants of Health (SDOH) Social History: SDOH Screenings   Food Insecurity: No Food Insecurity (03/29/2023)  Housing: Low Risk  (03/29/2023)  Transportation Needs: No Transportation Needs (03/29/2023)  Utilities: Not At Risk (03/29/2023)  Alcohol Screen: Low Risk  (05/11/2022)  Depression (PHQ2-9): High Risk (05/21/2022)  Financial Resource Strain: Low Risk  (05/11/2022)  Physical Activity: Inactive (05/11/2022)  Social Connections: Moderately Integrated (05/11/2022)  Stress: No Stress Concern Present  (05/11/2022)  Tobacco Use: High Risk (03/29/2023)   SDOH Interventions:     Readmission Risk Interventions    03/30/2023   11:46 AM  Readmission Risk Prevention Plan  Post Dischage Appt Complete  Medication Screening Complete  Transportation Screening Complete
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
## 2023-03-31 DIAGNOSIS — S32591A Other specified fracture of right pubis, initial encounter for closed fracture: Secondary | ICD-10-CM | POA: Diagnosis not present

## 2023-03-31 NOTE — TOC Progression Note (Signed)
Transition of Care [facility]) - Progression Note    Patient Details  Name: BENTLEY HARALSON MRN: [id number] Date of Birth: 06-23-1946  [facility] of [facility]) CM/SW Contact  Coralyn Helling, [place] Phone Number: 03/31/2023, 12:46 PM  Clinical Narrative:   TOC presented patient with bed offers. Patient to discuss with daughter and provide TOC with choice.   TOC to follow.     Expected Discharge Plan: Skilled Nursing Facility Barriers to Discharge: No Barriers Identified  Expected Discharge Plan and Services In-house Referral: Clinical Social Work Discharge Planning Services: NA Post Acute Care Choice: Skilled Nursing Facility Living arrangements for the past 2 months: Single Family Home                 DME Arranged: N/A DME Agency: NA                   Social Determinants of Health (SDOH) Interventions SDOH Screenings   Food Insecurity: No Food Insecurity (03/29/2023)  Housing: Low Risk  (03/29/2023)  Transportation Needs: No Transportation Needs (03/29/2023)  Utilities: Not At Risk (03/29/2023)  Alcohol Screen: Low Risk  (05/11/2022)  Depression (PHQ2-9): High Risk (05/21/2022)  Financial Resource Strain: Low Risk  (05/11/2022)  Physical Activity: Inactive (05/11/2022)  Social Connections: Moderately Integrated (05/11/2022)  Stress: No Stress Concern Present (05/11/2022)  Tobacco Use: High Risk (03/29/2023)    Readmission Risk Interventions    03/30/2023   11:46 AM  Readmission Risk Prevention Plan  Post Dischage Appt Complete  Medication Screening Complete  Transportation Screening Complete

## 2023-03-31 NOTE — Plan of Care (Signed)

## 2023-03-31 NOTE — Progress Notes (Signed)
PROGRESS NOTE    Emily Horton  RUE:[id number] DOB: 07-28-1946 DOA: 03/29/2023 PCP: [name], MD    Brief Narrative:   Emily Horton is a 77 y.o. female with past medical history significant for breast cancer, osteoporosis, history of hip fracture, MDD/GAD who presented to [facility] ED on 4/23 from home via EMS following mechanical fall at home.  Patient reports was walking to her room in which her feet gave out and fell on her right hip.  Patient complaining of right hip pain.  Denies loss of consciousness.  Patient lives alone and utilizes a walker for ambulation at baseline.  Patient reports very painful to walk following a fall.  No other specific complaints.  Denied headache, no dizziness, no chest pain, no palpitations, no shortness of breath, no abdominal pain.  In the ED, temperature 98.2 F, HR 92, RR 18, BP 144/100, SpO2 97% on room air.  WBC 8.7, hemoglobin 13.5, platelets 202.  Sodium 138, potassium 3.8, chloride 106, CO2 24, glucose 108, BUN 25, creatinine 1.03.  AST 12, ALT 10, total bilirubin 1.5.  Urinalysis unrevealing.  Chest x-ray with no active cardiopulmonary disease process.  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  Right femur with subtle fracture involving the inferior pubic rami on the right, screw fragment along the distal femoral metaphysis, noted right hip arthroplasty.  CT pelvis without contrast with nondisplaced superior and inferior pubic rami fracture on right side, small nondisplaced right sacral fractures anteriorly, intact right hip prosthesis.  Was seen by orthopedics, Dr. Christell Constant; with no recommendation of need for operative intervention with recommendation of weightbearing as tolerates and therapy  evaluation.  TRH was consulted for admission for further evaluation management of right pubic rami/sacral fracture.  Assessment & Plan:   Right pubic rami/sacral fracture Patient presenting to ED follow mechanical fall at home with right sided hip pain.  Imaging notable for nondisplaced superior/inferior pubic rami fracture on the right and small nondisplaced right sacral fracture.  Evaluated by orthopedics, Dr. Christell Constant; no surgical intervention required with recommendation of weightbearing as tolerates, pain control and therapy evaluation with outpatient follow-up in 2 weeks. -- WBAT BLE -- Oxycodone 5-10 mg PO q6h PRN moderate pain -- Morphine 2 mg IV every 4 hours as needed severe pain -- PT/OT currently recommending SNF placement, TOC consulted -- Continue therapy efforts while inpatient  Major depressive disorder/general anxiety disorder -- BuSpar 7.5 mg p.o. daily -- Wellbutrin 300 mg p.o. daily -- Clonazepam 0.5 mg p.o. 3 times daily as needed anxiety   DVT prophylaxis: enoxaparin (LOVENOX) injection 40 mg Start: 03/29/23 2200    Code Status: Full Code Family Communication: No family present at bedside this morning  Disposition Plan:  Level of care: Med-Surg Status is: Inpatient Remains inpatient appropriate because: Pending SNF placement    Consultants:  Orthopedics, Dr. Christell Constant  Procedures:  None  Antimicrobials:  None   Subjective: Patient seen examined bedside, resting comfortably.  Lying in bed.  Eating breakfast.  Pain currently controlled.  Awaiting SNF placement.  No other specific questions or concerns at this time.  Denies headache, no dizziness, no chest pain, no palpitations, no fever/chills/night sweats, no nausea/vomiting/diarrhea, no cough/congestion, no abdominal pain, no focal weakness, no fatigue, no paresthesias.  No acute events  overnight per nursing staff.  Objective: Vitals:   03/30/23 0041 03/30/23 0551 03/30/23 1219 03/30/23 1948  BP: 118/63  101/72 121/64 116/65  Pulse: 86 83 91 89  Resp: Temp: 98.5 F (36.9 C) 98.2 F (36.8 C) 98.3 F (36.8 C) 98.5 F (36.9 C)  TempSrc: Oral  Oral Oral  SpO2: 94% 92% 95% 96%    Intake/Output Summary (Last 24 hours) at 03/31/2023 1141 Last data filed at 03/31/2023 1000 Gross per 24 hour  Intake --  Output 650 ml  Net -650 ml   There were no vitals filed for this visit.  Examination:  Physical Exam: GEN: NAD, alert and oriented x 3, wd/wn HEENT: NCAT, PERRL, EOMI, sclera clear, MMM PULM: CTAB w/o wheezes/crackles, normal respiratory effort, on room air CV: RRR w/o M/G/R GI: abd soft, NTND, NABS, no R/G/M MSK: no peripheral edema, muscle strength globally intact 5/5 bilateral upper/lower extremities NEURO: CN II-XII intact, no focal deficits, sensation to light touch intact PSYCH: normal mood/affect Integumentary: dry/intact, no rashes or wounds    Data Reviewed: I have personally reviewed following labs and imaging studies  CBC: Recent Labs  Lab 03/29/23 1050  WBC 8.7  NEUTROABS 6.2  HGB 13.5  HCT 40.3  MCV 93.1  PLT 202   Basic Metabolic Panel: Recent Labs  Lab 03/29/23 1050  NA 138  K 3.8  CL 106  CO2 24  GLUCOSE 108*  BUN 25*  CREATININE 1.03*  CALCIUM 9.6   GFR: CrCl cannot be calculated (Unknown ideal weight.). Liver Function Tests: Recent Labs  Lab 03/29/23 1050  AST 12*  ALT 10  ALKPHOS 83  BILITOT 1.1  PROT 7.0  ALBUMIN 4.1   No results for input(s): "LIPASE", "AMYLASE" in the last 168 hours. No results for input(s): "AMMONIA" in the last 168 hours. Coagulation Profile: No results for input(s): "INR", "PROTIME" in the last 168 hours. Cardiac Enzymes: No results for input(s): "CKTOTAL", "CKMB", "CKMBINDEX", "TROPONINI" in the last 168 hours. BNP (last 3 results) No results for input(s): "PROBNP" in the last 8760 hours. HbA1C: No results for input(s): "HGBA1C" in the last 72 hours. CBG: No results for input(s): "GLUCAP"  in the last 168 hours. Lipid Profile: No results for input(s): "CHOL", "HDL", "LDLCALC", "TRIG", "CHOLHDL", "LDLDIRECT" in the last 72 hours. Thyroid Function Tests: No results for input(s): "TSH", "T4TOTAL", "FREET4", "T3FREE", "THYROIDAB" in the last 72 hours. Anemia Panel: No results for input(s): "VITAMINB12", "FOLATE", "FERRITIN", "TIBC", "IRON", "RETICCTPCT" in the last 72 hours. Sepsis Labs: No results for input(s): "PROCALCITON", "LATICACIDVEN" in the last 168 hours.  No results found for this or any previous visit (from the past 240 hour(s)).       Radiology Studies: DG FEMUR, MIN 2 VIEWS RIGHT  Result Date: 03/29/2023 CLINICAL DATA:  Pain EXAM: RIGHT FEMUR 2 VIEWS COMPARISON:  Pelvic CT 03/29/2023.  Older x-rays FINDINGS: Right hip arthroplasty identified with screw fixated acetabular cup and Press-Fit components. Underlying osteopenia. No dislocation. There is some deformity of the right inferior pubic ramus consistent with known injury. The superior pubic ramus fracture is not well seen on this x-ray. No hardware failure. There is also a focal screw fragment along the distal femoral metaphysis with some benign periosteal reaction. IMPRESSION: Right hip arthroplasty. Subtle fracture involving the inferior pubic ramus on the right. The superior pubic ramus fracture by CT is not well seen on this x-ray. Screw fragment along the distal femoral metaphysis. Please correlate with clinical history Electronically  Signed   By: Karen Kays M.D.   On: 03/29/2023 18:39   CT PELVIS WO CONTRAST  Result Date: 03/29/2023 CLINICAL DATA:  Larey Seat.  Possible pelvic fractures. EXAM: CT PELVIS WITHOUT CONTRAST TECHNIQUE: Multidetector CT imaging of the pelvis was performed following the standard protocol without intravenous contrast. RADIATION DOSE REDUCTION: This exam was performed according to the departmental dose-optimization program which includes automated exposure control, adjustment of the mA  and/or kV according to patient size and/or use of iterative reconstruction technique. COMPARISON:  09/03/2015 FINDINGS: Both hips are normally located. The right hip prosthesis is intact. No periprosthetic fracture. There are nondisplaced superior and inferior pubic rami fractures on the right side as suggested the radiographs. The pubic symphysis and SI joints are intact. Small nondisplaced right sacral fractures are noted anteriorly. The hip and pelvic musculature are grossly normal by CT. There are surgical changes noted in the subcutaneous fat overlying the right hip. No significant intrapelvic abnormalities are identified. IMPRESSION: 1. Nondisplaced superior and inferior pubic rami fractures on the right side as suggested on the radiographs. 2. Small nondisplaced right sacral fractures anteriorly. 3. Intact right hip prosthesis. Electronically Signed   By: Rudie Meyer M.D.   On: 03/29/2023 16:39        Scheduled Meds:  anastrozole  1 mg Oral Daily   buPROPion  300 mg Oral Daily   busPIRone  7.5 mg Oral Daily   enoxaparin (LOVENOX) injection  40 mg Subcutaneous Q24H   loratadine  10 mg Oral Daily   meloxicam  15 mg Oral Daily   polyethylene glycol  34 g Oral Daily   senna-docusate  2 tablet Oral Daily   sodium chloride flush  3 mL Intravenous Q12H   Continuous Infusions:  sodium chloride       LOS: 2 days    Time spent: 50 minutes spent on chart review, discussion with nursing staff, consultants, updating family and interview/physical exam; more than 50% of that time was spent in counseling and/or coordination of care.    Alvira Philips [place], DO [facility] Available via Epic secure chat 7am-7pm After these hours, please refer to coverage provider listed on [email or web address] 03/31/2023, 11:41 AM

## 2023-03-31 NOTE — Progress Notes (Signed)
Physical Therapy Treatment Patient Details Name: Emily Horton MRN: [id number] DOB: 1946/09/07 Today's Date: 03/31/2023   History of Present Illness Pt is a 77 y.o. female presenting to ED 4/23 after fall at home landing on her right hip. Found to have rami pubic rami fractures and a nondisplaced sacral ala fracture. PMH significant for breast cancer, osteoporosis, hip fracture.    PT Comments    Pt very motivated with therapy, received pain medication at beginning of treatment from RN. Pt tolerates BLE strengthening exercises while supine, performs heel slide to ~20 deg on R side for comfort. Pt with good quad activation bil. Pt able to abduct and adduct RLE but increase in pain so stopped. Pt needing max A supine<>sit, therapist assisting via bedpad, increased time, pt using hands on bedrails to assist as able. Pt reports being nervous about getting nauseas and vomiting due to pain, tolerates static side sitting with weight shifted L at EOB for ~3 min, unable to push into upright sitting with equal bil weight-bearing in hips and declines transfers due to pain.   Recommendations for follow up therapy are one component of a multi-disciplinary discharge planning process, led by the attending physician.  Recommendations may be updated based on patient status, additional functional criteria and insurance authorization.  Follow Up Recommendations  Can patient physically be transported by private vehicle: No    Assistance Recommended at Discharge Frequent or constant Supervision/Assistance  Patient can return home with the following A lot of help with walking and/or transfers;A lot of help with bathing/dressing/bathroom;Assistance with cooking/housework;Assist for transportation;Help with stairs or ramp for entrance   Equipment Recommendations  None recommended by PT    Recommendations for Other Services       Precautions / Restrictions Precautions Precautions: Fall Restrictions Weight Bearing  Restrictions: No Other Position/Activity Restrictions: BLE WBAT     Mobility  Bed Mobility Overal bed mobility: Needs Assistance Bed Mobility: Supine to Sit, Sit to Supine  Supine to sit: Max assist Sit to supine: Max assist  General bed mobility comments: pt inches LLE towards EOB, max A to mobilize RLE and upright trunk to come to sitting EOB with use of bedpad and increased time; max A to return to supine, use of bedassist to scoot up and reposition to comfort, pt using BUE on bedrails to assist as able    Transfers  General transfer comment: pt declines    Ambulation/Gait    Stairs      Wheelchair Mobility    Modified Rankin (Stroke Patients Only)       Balance Overall balance assessment: Needs assistance Sitting-balance support: Feet supported, Single extremity supported Sitting balance-Leahy Scale: Poor Sitting balance - Comments: pt leaning L appears to prefer to decrease R hip weightbearing for comfort     Cognition Arousal/Alertness: Awake/alert Behavior During Therapy: WFL for tasks assessed/performed Overall Cognitive Status: Within Functional Limits for tasks assessed     Exercises General Exercises - Lower Extremity Ankle Circles/Pumps: Supine, AROM, Strengthening, Both, 20 reps Quad Sets: Supine, AROM, Strengthening, Both, 10 reps Gluteal Sets: Supine, AROM, Strengthening, Both, 5 reps Heel Slides: Supine, AROM, Strengthening, Both, 10 reps (R ~20 deg)    General Comments        Pertinent Vitals/Pain Pain Assessment Pain Assessment: Faces Faces Pain Scale: Hurts whole lot Pain Location: R hip and low back Pain Descriptors / Indicators: Guarding, Grimacing, Discomfort Pain Intervention(s): Limited activity within patient's tolerance, Monitored during session, Repositioned, RN gave pain meds during session  Home Living           Prior Function            PT Goals (current goals can now be found in the care plan section) Acute Rehab  PT Goals Patient Stated Goal: to do better and go home PT Goal Formulation: With patient Time For Goal Achievement: 04/13/23 Potential to Achieve Goals: Good Progress towards PT goals: Progressing toward goals    Frequency    Min 1X/week      PT Plan Current plan remains appropriate    Co-evaluation              AM-PAC PT "6 Clicks" Mobility   Outcome Measure  Help needed turning from your back to your side while in a flat bed without using bedrails?: A Lot Help needed moving from lying on your back to sitting on the side of a flat bed without using bedrails?: A Lot Help needed moving to and from a bed to a chair (including a wheelchair)?: Total Help needed standing up from a chair using your arms (e.g., wheelchair or bedside chair)?: Total Help needed to walk in hospital room?: Total Help needed climbing 3-5 steps with a railing? : Total 6 Click Score: 8    End of Session   Activity Tolerance: Patient limited by pain;Patient tolerated treatment well Patient left: in bed;with call bell/phone within reach Nurse Communication: Mobility status PT Visit Diagnosis: Unsteadiness on feet (R26.81);Other abnormalities of gait and mobility (R26.89);Muscle weakness (generalized) (M62.81);History of falling (Z91.81);Difficulty in walking, not elsewhere classified (R26.2);Pain Pain - Right/Left: Right Pain - part of body: Leg     Time: 1660-6301 PT Time Calculation (min) (ACUTE ONLY): 30 min  Charges:  $Therapeutic Exercise: 8-22 mins $Therapeutic Activity: 8-22 mins                      Tori Toris Laverdiere PT, DPT 03/31/23, 3:15 PM

## 2023-03-31 NOTE — Plan of Care (Signed)
  Problem: Education: Goal: Knowledge of General Education information will improve Description: Including pain rating scale, medication(s)/side effects and non-pharmacologic comfort measures 03/31/2023 0528 by Irineo Axon, RN Outcome: Progressing 03/31/2023 0527 by Irineo Axon, RN Outcome: Progressing   Problem: Health Behavior/Discharge Planning: Goal: Ability to manage health-related needs will improve 03/31/2023 0528 by Irineo Axon, RN Outcome: Progressing 03/31/2023 0527 by Irineo Axon, RN Outcome: Progressing   Problem: Activity: Goal: Risk for activity intolerance will decrease 03/31/2023 0528 by Irineo Axon, RN Outcome: Progressing 03/31/2023 0527 by Irineo Axon, RN Outcome: Progressing   Problem: Nutrition: Goal: Adequate nutrition will be maintained 03/31/2023 0528 by Irineo Axon, RN Outcome: Progressing 03/31/2023 0527 by Irineo Axon, RN Outcome: Progressing   Problem: Coping: Goal: Level of anxiety will decrease Outcome: Progressing   Problem: Elimination: Goal: Will not experience complications related to bowel motility 03/31/2023 0528 by Irineo Axon, RN Outcome: Progressing 03/31/2023 0527 by Irineo Axon, RN Outcome: Progressing   Problem: Pain Managment: Goal: General experience of comfort will improve 03/31/2023 0528 by Irineo Axon, RN Outcome: Progressing 03/31/2023 0527 by Irineo Axon, RN Outcome: Progressing   Problem: Safety: Goal: Ability to remain free from injury will improve 03/31/2023 0528 by Irineo Axon, RN Outcome: Progressing 03/31/2023 0527 by Irineo Axon, RN Outcome: Progressing   Problem: Skin Integrity: Goal: Risk for impaired skin integrity will decrease 03/31/2023 0528 by Irineo Axon, RN Outcome: Progressing 03/31/2023 0527 by Irineo Axon, RN Outcome: Progressing

## 2023-03-31 NOTE — Plan of Care (Signed)

## 2023-04-01 DIAGNOSIS — S32591A Other specified fracture of right pubis, initial encounter for closed fracture: Secondary | ICD-10-CM | POA: Diagnosis not present

## 2023-04-01 LAB — VITAMIN D 25 HYDROXY (VIT D DEFICIENCY, FRACTURES): Vit D, 25-Hydroxy: 25.52 ng/mL — ABNORMAL LOW (ref 30–100)

## 2023-04-01 MED ORDER — CHOLECALCIFEROL 10 MCG (400 UNIT) PO TABS
800.0000 [IU] | ORAL_TABLET | Freq: Every day | ORAL | Status: DC
  Administered 2023-04-01 – 2023-04-04 (×4): 800 [IU] via ORAL
  Filled 2023-04-01 (×5): qty 2

## 2023-04-01 NOTE — Plan of Care (Signed)
  Problem: Education: Goal: Knowledge of General Education information will improve Description: Including pain rating scale, medication(s)/side effects and non-pharmacologic comfort measures Outcome: Progressing   Problem: Health Behavior/Discharge Planning: Goal: Ability to manage health-related needs will improve Outcome: Progressing   Problem: Clinical Measurements: Goal: Diagnostic test results will improve Outcome: Progressing   Problem: Activity: Goal: Risk for activity intolerance will decrease Outcome: Progressing   Problem: Coping: Goal: Level of anxiety will decrease Outcome: Progressing   Problem: Elimination: Goal: Will not experience complications related to bowel motility Outcome: Progressing   Problem: Pain Managment: Goal: General experience of comfort will improve Outcome: Progressing   Problem: Safety: Goal: Ability to remain free from injury will improve Outcome: Progressing   Problem: Skin Integrity: Goal: Risk for impaired skin integrity will decrease Outcome: Progressing

## 2023-04-01 NOTE — TOC Progression Note (Signed)
Transition of Care [facility]) - Progression Note    Patient Details  Name: CANDELA KRUL MRN: [id number] Date of Birth: [birth year]  Transition of [facility] CM/SW Contact  Howell Rucks, RN Phone Number: 04/01/2023, 11:29 AM  Clinical Narrative:   NCM spoke with [name] (dtr) on phone regarding facility choice, family has chosen [place], requesting transfer to facility on 04/04/23 for family to be present during transport, MD agreeable to Monday transport. NCM outreach to [place] at facility, confirmed bed availability on Monday. Will continue to follow.     Expected Discharge Plan: Skilled Nursing Facility Barriers to Discharge: Continued Medical Work up  Expected Discharge Plan and Services In-house Referral: Clinical Social Work Discharge Planning Services: NA Post Acute Care Choice: Skilled Nursing Facility Living arrangements for the past 2 months: Single Family Home                 DME Arranged: N/A DME Agency: NA                   Social Determinants of Health (SDOH) Interventions SDOH Screenings   Food Insecurity: No Food Insecurity (03/29/2023)  Housing: Low Risk  (03/29/2023)  Transportation Needs: No Transportation Needs (03/29/2023)  Utilities: Not At Risk (03/29/2023)  Alcohol Screen: Low Risk  (05/11/2022)  Depression (PHQ2-9): High Risk (05/21/2022)  Financial Resource Strain: Low Risk  (05/11/2022)  Physical Activity: Inactive (05/11/2022)  Social Connections: Moderately Integrated (05/11/2022)  Stress: No Stress Concern Present (05/11/2022)  Tobacco Use: High Risk (03/29/2023)    Readmission Risk Interventions    03/30/2023   11:46 AM  Readmission Risk Prevention Plan  Post Dischage Appt Complete  Medication Screening Complete  Transportation Screening Complete

## 2023-04-01 NOTE — Progress Notes (Signed)
PROGRESS NOTE    Emily Horton  ZOX:[id number] DOB: 1946-09-20 DOA: 03/29/2023 PCP: [name], MD    Brief Narrative:   Emily Horton is a 77 y.o. female with past medical history significant for breast cancer, osteoporosis, history of hip fracture, MDD/GAD who presented to [facility] ED on 4/23 from home via EMS following mechanical fall at home.  Patient reports was walking to her room in which her feet gave out and fell on her right hip.  Patient complaining of right hip pain.  Denies loss of consciousness.  Patient lives alone and utilizes a walker for ambulation at baseline.  Patient reports very painful to walk following a fall.  No other specific complaints.  Denied headache, no dizziness, no chest pain, no palpitations, no shortness of breath, no abdominal pain.  In the ED, temperature 98.2 F, HR 92, RR 18, BP 144/100, SpO2 97% on room air.  WBC 8.7, hemoglobin 13.5, platelets 202.  Sodium 138, potassium 3.8, chloride 106, CO2 24, glucose 108, BUN 25, creatinine 1.03.  AST 12, ALT 10, total bilirubin 1.5.  Urinalysis unrevealing.  Chest x-ray with no active cardiopulmonary disease process.  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  Right femur with subtle fracture involving the inferior pubic rami on the right, screw fragment along the distal femoral metaphysis, noted right hip arthroplasty.  CT pelvis without contrast with nondisplaced superior and inferior pubic rami fracture on right side, small nondisplaced right sacral fractures anteriorly, intact right hip prosthesis.  Was seen by orthopedics, Dr. Christell Constant; with no recommendation of need for operative intervention with recommendation of weightbearing as tolerates and therapy  evaluation.  TRH was consulted for admission for further evaluation management of right pubic rami/sacral fracture.  Assessment & Plan:   Right pubic rami/sacral fracture Patient presenting to ED follow mechanical fall at home with right sided hip pain.  Imaging notable for nondisplaced superior/inferior pubic rami fracture on the right and small nondisplaced right sacral fracture.  Evaluated by orthopedics, Dr. Christell Constant; no surgical intervention required with recommendation of weightbearing as tolerates, pain control and therapy evaluation with outpatient follow-up in 2 weeks. -- WBAT BLE -- Oxycodone 5-10 mg PO q6h PRN moderate pain -- Morphine 2 mg IV every 4 hours as needed severe pain -- PT/OT currently recommending SNF placement, TOC consulted; pending discharge to [facility] on Monday -- Continue therapy efforts while inpatient  Major depressive disorder/general anxiety disorder -- BuSpar 7.5 mg p.o. daily -- Wellbutrin 300 mg p.o. daily -- Clonazepam 0.5 mg p.o. 3 times daily as needed anxiety   DVT prophylaxis: enoxaparin (LOVENOX) injection 40 mg Start: 03/29/23 2200    Code Status: Full Code Family Communication: No family present at bedside this morning  Disposition Plan:  Level of care: Med-Surg Status is: Inpatient Remains inpatient appropriate because: Pending discharge to [facility] SN[facility] on Monday    Consultants:  Orthopedics, Dr. Christell Constant  Procedures:  None  Antimicrobials:  None   Subjective: Patient seen examined bedside, resting comfortably.  Lying in bed.  Pain currently controlled; received dose of morphine this morning.  Has chosen [facility] SNF on discharge, pending bed availability likely on Monday.  No other specific questions or concerns at this time.  Denies headache, no dizziness, no chest pain,  no palpitations, no fever/chills/night sweats, no nausea/vomiting/diarrhea, no cough/congestion, no abdominal pain, no focal weakness, no fatigue, no  paresthesias.  No acute events overnight per nursing staff.  Objective: Vitals:   03/31/23 1630 03/31/23 1942 04/01/23 0437 04/01/23 1134  BP:  129/60 120/78 133/84  Pulse:  94 90 88  Resp:  18 16 18   Temp:  98 F (36.7 C) 98 F (36.7 C) 97.8 F (36.6 C)  TempSrc:  Oral Oral Oral  SpO2:  95% 95% 97%  Weight: 88.7 kg     Height: 5\' 4"  (1.626 m)       Intake/Output Summary (Last 24 hours) at 04/01/2023 1238 Last data filed at 04/01/2023 0503 Gross per 24 hour  Intake 240 ml  Output 475 ml  Net -235 ml   Filed Weights   03/31/23 1630  Weight: 88.7 kg    Examination:  Physical Exam: GEN: NAD, alert and oriented x 3, wd/wn HEENT: NCAT, PERRL, EOMI, sclera clear, MMM PULM: CTAB w/o wheezes/crackles, normal respiratory effort, on room air CV: RRR w/o M/G/R GI: abd soft, NTND, NABS, no R/G/M MSK: no peripheral edema, muscle strength globally intact 5/5 bilateral upper/lower extremities NEURO: CN II-XII intact, no focal deficits, sensation to light touch intact PSYCH: normal mood/affect Integumentary: dry/intact, no rashes or wounds    Data Reviewed: I have personally reviewed following labs and imaging studies  CBC: Recent Labs  Lab 03/29/23 1050  WBC 8.7  NEUTROABS 6.2  HGB 13.5  HCT 40.3  MCV 93.1  PLT 202   Basic Metabolic Panel: Recent Labs  Lab 03/29/23 1050  NA 138  K 3.8  CL 106  CO2 24  GLUCOSE 108*  BUN 25*  CREATININE 1.03*  CALCIUM 9.6   GFR: Estimated Creatinine Clearance: 51.7 mL/min (A) (by C-G formula based on SCr of 1.03 mg/dL (H)). Liver Function Tests: Recent Labs  Lab 03/29/23 1050  AST 12*  ALT 10  ALKPHOS 83  BILITOT 1.1  PROT 7.0  ALBUMIN 4.1   No results for input(s): "LIPASE", "AMYLASE" in the last 168 hours. No results for input(s): "AMMONIA" in the last 168 hours. Coagulation Profile: No results for input(s): "INR", "PROTIME" in the last 168 hours. Cardiac Enzymes: No results for input(s): "CKTOTAL", "CKMB",  "CKMBINDEX", "TROPONINI" in the last 168 hours. BNP (last 3 results) No results for input(s): "PROBNP" in the last 8760 hours. HbA1C: No results for input(s): "HGBA1C" in the last 72 hours. CBG: No results for input(s): "GLUCAP" in the last 168 hours. Lipid Profile: No results for input(s): "CHOL", "HDL", "LDLCALC", "TRIG", "CHOLHDL", "LDLDIRECT" in the last 72 hours. Thyroid Function Tests: No results for input(s): "TSH", "T4TOTAL", "FREET4", "T3FREE", "THYROIDAB" in the last 72 hours. Anemia Panel: No results for input(s): "VITAMINB12", "FOLATE", "FERRITIN", "TIBC", "IRON", "RETICCTPCT" in the last 72 hours. Sepsis Labs: No results for input(s): "PROCALCITON", "LATICACIDVEN" in the last 168 hours.  No results found for this or any previous visit (from the past 240 hour(s)).       Radiology Studies: No results found.      Scheduled Meds:  anastrozole  1 mg Oral Daily   buPROPion  300 mg Oral Daily   busPIRone  7.5 mg Oral Daily   enoxaparin (LOVENOX) injection  40 mg Subcutaneous Q24H   loratadine  10 mg Oral Daily   meloxicam  15 mg Oral Daily   polyethylene glycol  34 g Oral Daily   senna-docusate  2 tablet Oral Daily   sodium chloride flush  3 mL Intravenous Q12H   Continuous Infusions:  sodium chloride       LOS: 3 days    Time spent: 50 minutes spent on chart review, discussion with nursing staff, consultants, updating family and interview/physical exam; more than 50% of that time was spent in counseling and/or coordination of care.    Alvira Philips [place], DO [facility] Available via Epic secure chat 7am-7pm After these hours, please refer to coverage provider listed on [email or web address] 04/01/2023, 12:38 PM

## 2023-04-02 DIAGNOSIS — S3210XA Unspecified fracture of sacrum, initial encounter for closed fracture: Secondary | ICD-10-CM | POA: Diagnosis not present

## 2023-04-02 DIAGNOSIS — S32591A Other specified fracture of right pubis, initial encounter for closed fracture: Secondary | ICD-10-CM | POA: Diagnosis not present

## 2023-04-02 NOTE — Progress Notes (Signed)
Occupational Therapy Treatment Patient Details Name: Emily Horton MRN: [id number] DOB: Jan 07, 1946 Today's Date: 04/02/2023   History of present illness Pt is a 77 y.o. female presenting to ED 4/23 after fall at home landing on her right hip. Found to have rami pubic rami fractures and a nondisplaced sacral ala fracture. PMH significant for breast cancer, osteoporosis, hip fracture.   OT comments  Patient was able to engage in sitting EOB for grooming tasks. Patient was able to stand EOB with RW with BUE support with mod A. Patient with increased pain in back and RLE limiting session. Patient's discharge plan remains appropriate at this time. OT will continue to follow acutely.     Recommendations for follow up therapy are one component of a multi-disciplinary discharge planning process, led by the attending physician.  Recommendations may be updated based on patient status, additional functional criteria and insurance authorization.    Assistance Recommended at Discharge Intermittent Supervision/Assistance  Patient can return home with the following  Two people to help with walking and/or transfers;Two people to help with bathing/dressing/bathroom;Assistance with cooking/housework;Help with stairs or ramp for entrance;Assist for transportation   Equipment Recommendations  None recommended by OT       Precautions / Restrictions Precautions Precautions: Fall Restrictions Weight Bearing Restrictions: Yes RLE Weight Bearing: Weight bearing as tolerated LLE Weight Bearing: Weight bearing as tolerated       Mobility Bed Mobility Overal bed mobility: Needs Assistance Bed Mobility: Supine to Sit, Sit to Supine     Supine to sit: Max assist Sit to supine: Max assist, +2 for safety/equipment   General bed mobility comments: patient needed increased A to advance RLE to EOB. patient needed TD to transition back to supine in bed with +2 for safety.             ADL either performed or  assessed with clinical judgement   ADL Overall ADL's : Needs assistance/impaired     Grooming: Sitting;Min guard;Oral care;Wash/dry face Grooming Details (indicate cue type and reason): with cues for leaning shoulders forwards to maintain sitting balance.               Lower Body Dressing Details (indicate cue type and reason): paitent was TD for donning/doffing socks.   Toilet Transfer Details (indicate cue type and reason): patient was mod A for sit to stand at EOB with patient having increased pain with standing. patient unable to advance feet but on second attempt at standing was able to stand for over 1 min with BUE support.                  Cognition Arousal/Alertness: Awake/alert Behavior During Therapy: WFL for tasks assessed/performed Overall Cognitive Status: Within Functional Limits for tasks assessed                         Pertinent Vitals/ Pain       Pain Assessment Pain Assessment: Faces Faces Pain Scale: Hurts whole lot Pain Location: R hip and low back Pain Descriptors / Indicators: Guarding, Grimacing, Discomfort Pain Intervention(s): Limited activity within patient's tolerance, Monitored during session, Repositioned         Frequency  Min 2X/week        Progress Toward Goals  OT Goals(current goals can now be found in the care plan section)  Progress towards OT goals: Progressing toward goals     Plan Discharge plan remains appropriate       AM-PAC  OT "6 Clicks" Daily Activity     Outcome Measure   Help from another person eating meals?: A Little Help from another person taking care of personal grooming?: A Little Help from another person toileting, which includes using toliet, bedpan, or urinal?: Total Help from another person bathing (including washing, rinsing, drying)?: A Lot Help from another person to put on and taking off regular upper body clothing?: A Little Help from another person to put on and taking off regular  lower body clothing?: Total 6 Click Score: 13    End of Session Equipment Utilized During Treatment: Gait belt;Rolling walker (2 wheels)  OT Visit Diagnosis: Unsteadiness on feet (R26.81);Muscle weakness (generalized) (M62.81);Other abnormalities of gait and mobility (R26.89)   Activity Tolerance Patient tolerated treatment well;Patient limited by pain   Patient Left in bed;with call bell/phone within reach;with bed alarm set;with nursing/sitter in room   Nurse Communication          Time: [phone] OT Time Calculation (min): 24 min  Charges: OT General Charges $OT Visit: 1 Visit OT Treatments $Self Care/Home Management : 23-37 mins  Rosalio Loud, MS Acute Rehabilitation Department Office# [phone]   Selinda Flavin 04/02/2023, 10:06 AM

## 2023-04-02 NOTE — Progress Notes (Addendum)
PROGRESS NOTE    Emily Horton  ZOX:[id number] DOB: 01-18-1946 DOA: 03/29/2023 PCP: [name], MD    Brief Narrative:   Emily Horton is a 77 y.o. female with past medical history significant for breast cancer, osteoporosis, history of hip fracture, MDD/GAD who presented to [facility] ED on 4/23 from home via EMS following mechanical fall at home.  Patient reports was walking to her room in which her feet gave out and fell on her right hip.  Patient complaining of right hip pain.  Denies loss of consciousness.  Patient lives alone and utilizes a walker for ambulation at baseline.  Patient reports very painful to walk following a fall.  No other specific complaints.  Denied headache, no dizziness, no chest pain, no palpitations, no shortness of breath, no abdominal pain.  In the ED, temperature 98.2 F, HR 92, RR 18, BP 144/100, SpO2 97% on room air.  WBC 8.7, hemoglobin 13.5, platelets 202.  Sodium 138, potassium 3.8, chloride 106, CO2 24, glucose 108, BUN 25, creatinine 1.03.  AST 12, ALT 10, total bilirubin 1.5.  Urinalysis unrevealing.  Chest x-ray with no active cardiopulmonary disease process.  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  Right femur with subtle fracture involving the inferior pubic rami on the right, screw fragment along the distal femoral metaphysis, noted right hip arthroplasty.  CT pelvis without contrast with nondisplaced superior and inferior pubic rami fracture on right side, small nondisplaced right sacral fractures anteriorly, intact right hip prosthesis.  Was seen by orthopedics, Dr. Christell Constant; with no recommendation of need for operative intervention with recommendation of weightbearing as tolerates and therapy  evaluation.  TRH was consulted for admission for further evaluation management of right pubic rami/sacral fracture.  Assessment & Plan:   Right pubic rami/sacral fracture Patient presenting to ED follow mechanical fall at home with right sided hip pain.  Imaging notable for nondisplaced superior/inferior pubic rami fracture on the right and small nondisplaced right sacral fracture.  Evaluated by orthopedics, Dr. Christell Constant; no surgical intervention required with recommendation of weightbearing as tolerates, pain control and therapy evaluation with outpatient follow-up in 2 weeks. -- WBAT BLE -- Oxycodone 5-10 mg PO q6h PRN moderate pain -- Morphine 2 mg IV every 4 hours as needed severe pain -- PT/OT currently recommending SNF placement, TOC consulted; pending discharge to [facility] SNF on Monday -- Continue therapy efforts while inpatient  Major depressive disorder/general anxiety disorder -- BuSpar 7.5 mg p.o. daily -- Wellbutrin 300 mg p.o. daily -- Clonazepam 0.5 mg p.o. 3 times daily as needed anxiety  Vitamin D deficiency Vitamin D 25-hydroxy level 25. --Cholecalciferol 800 units p.o. daily   DVT prophylaxis: enoxaparin (LOVENOX) injection 40 mg Start: 03/29/23 2200    Code Status: Full Code Family Communication: No family present at bedside this morning  Disposition Plan:  Level of care: Med-Surg Status is: Inpatient Remains inpatient appropriate because: Pending discharge to [facility] SNF on Monday    Consultants:  Orthopedics, Dr. Christell Constant  Procedures:  None  Antimicrobials:  None   Subjective: Patient seen examined bedside, resting comfortably.  Lying in bed.  Pain currently controlled.  No questions or concerns at this time.  Denies headache, no dizziness, no chest pain, no palpitations, no fever/chills/night sweats, no nausea/vomiting/diarrhea,  no cough/congestion, no abdominal pain, no focal weakness, no fatigue, no paresthesias.  No acute events overnight per  nursing staff.  Plan discharge to [facility] SNF on Monday.  Objective: Vitals:   04/01/23 0437 04/01/23 1134 04/01/23 2104 04/02/23 0516  BP: 120/78 133/84 120/65 124/62  Pulse: 90 88 96 87  Resp: 16 18 18 18   Temp: 98 F (36.7 C) 97.8 F (36.6 C) 98.1 F (36.7 C) 98 F (36.7 C)  TempSrc: Oral Oral    SpO2: 95% 97% 97% 98%  Weight:      Height:        Intake/Output Summary (Last 24 hours) at 04/02/2023 1221 Last data filed at 04/02/2023 0600 Gross per 24 hour  Intake 940 ml  Output 500 ml  Net 440 ml   Filed Weights   03/31/23 1630  Weight: 88.7 kg    Examination:  Physical Exam: GEN: NAD, alert and oriented x 3, wd/wn HEENT: NCAT, PERRL, EOMI, sclera clear, MMM PULM: CTAB w/o wheezes/crackles, normal respiratory effort, on room air CV: RRR w/o M/G/R GI: abd soft, NTND, NABS, no R/G/M MSK: no peripheral edema, muscle strength globally intact 5/5 bilateral upper/lower extremities NEURO: CN II-XII intact, no focal deficits, sensation to light touch intact PSYCH: normal mood/affect Integumentary: dry/intact, no rashes or wounds    Data Reviewed: I have personally reviewed following labs and imaging studies  CBC: Recent Labs  Lab 03/29/23 1050  WBC 8.7  NEUTROABS 6.2  HGB 13.5  HCT 40.3  MCV 93.1  PLT 202   Basic Metabolic Panel: Recent Labs  Lab 03/29/23 1050  NA 138  K 3.8  CL 106  CO2 24  GLUCOSE 108*  BUN 25*  CREATININE 1.03*  CALCIUM 9.6   GFR: Estimated Creatinine Clearance: 51.7 mL/min (A) (by C-G formula based on SCr of 1.03 mg/dL (H)). Liver Function Tests: Recent Labs  Lab 03/29/23 1050  AST 12*  ALT 10  ALKPHOS 83  BILITOT 1.1  PROT 7.0  ALBUMIN 4.1   No results for input(s): "LIPASE", "AMYLASE" in the last 168 hours. No results for input(s): "AMMONIA" in the last 168 hours. Coagulation Profile: No results for input(s): "INR", "PROTIME" in the last 168 hours. Cardiac Enzymes: No results for input(s): "CKTOTAL", "CKMB",  "CKMBINDEX", "TROPONINI" in the last 168 hours. BNP (last 3 results) No results for input(s): "PROBNP" in the last 8760 hours. HbA1C: No results for input(s): "HGBA1C" in the last 72 hours. CBG: No results for input(s): "GLUCAP" in the last 168 hours. Lipid Profile: No results for input(s): "CHOL", "HDL", "LDLCALC", "TRIG", "CHOLHDL", "LDLDIRECT" in the last 72 hours. Thyroid Function Tests: No results for input(s): "TSH", "T4TOTAL", "FREET4", "T3FREE", "THYROIDAB" in the last 72 hours. Anemia Panel: No results for input(s): "VITAMINB12", "FOLATE", "FERRITIN", "TIBC", "IRON", "RETICCTPCT" in the last 72 hours. Sepsis Labs: No results for input(s): "PROCALCITON", "LATICACIDVEN" in the last 168 hours.  No results found for this or any previous visit (from the past 240 hour(s)).       Radiology Studies: No results found.      Scheduled Meds:  anastrozole  1 mg Oral Daily   buPROPion  300 mg Oral Daily   busPIRone  7.5 mg Oral Daily   cholecalciferol  800 Units Oral Daily   enoxaparin (LOVENOX) injection  40 mg Subcutaneous Q24H   loratadine  10 mg Oral Daily   meloxicam  15 mg Oral Daily   polyethylene glycol  34 g Oral Daily   senna-docusate  2  tablet Oral Daily   sodium chloride flush  3 mL Intravenous Q12H   Continuous Infusions:  sodium chloride       LOS: 4 days    Time spent: 43 minutes spent on chart review, discussion with nursing staff, consultants, updating family and interview/physical exam; more than 50% of that time was spent in counseling and/or coordination of care.    Alvira Philips [place], DO [facility] Available via Epic secure chat 7am-7pm After these hours, please refer to coverage provider listed on [email or web address] 04/02/2023, 12:21 PM

## 2023-04-02 NOTE — Plan of Care (Signed)
  Problem: Education: Goal: Knowledge of General Education information will improve Description: Including pain rating scale, medication(s)/side effects and non-pharmacologic comfort measures Outcome: Progressing   Problem: Clinical Measurements: Goal: Ability to maintain clinical measurements within normal limits will improve Outcome: Progressing   Problem: Activity: Goal: Risk for activity intolerance will decrease Outcome: Progressing   Problem: Coping: Goal: Level of anxiety will decrease Outcome: Progressing   Problem: Elimination: Goal: Will not experience complications related to bowel motility Outcome: Progressing   Problem: Pain Managment: Goal: General experience of comfort will improve Outcome: Progressing   Problem: Safety: Goal: Ability to remain free from injury will improve Outcome: Progressing   

## 2023-04-02 NOTE — Plan of Care (Signed)

## 2023-04-03 DIAGNOSIS — S32591A Other specified fracture of right pubis, initial encounter for closed fracture: Secondary | ICD-10-CM | POA: Diagnosis not present

## 2023-04-03 DIAGNOSIS — E559 Vitamin D deficiency, unspecified: Secondary | ICD-10-CM

## 2023-04-03 DIAGNOSIS — S32592A Other specified fracture of left pubis, initial encounter for closed fracture: Secondary | ICD-10-CM

## 2023-04-03 DIAGNOSIS — S3210XA Unspecified fracture of sacrum, initial encounter for closed fracture: Secondary | ICD-10-CM | POA: Diagnosis not present

## 2023-04-03 NOTE — Progress Notes (Signed)
PROGRESS NOTE    Emily Horton  ZOX:[id number] DOB: 1946-05-16 DOA: 03/29/2023 PCP: [name], MD    Brief Narrative:   Emily Horton is a 77 y.o. female with past medical history significant for breast cancer, osteoporosis, history of hip fracture, MDD/GAD who presented to [facility] ED on 4/23 from home via EMS following mechanical fall at home.  Patient reports was walking to her room in which her feet gave out and fell on her right hip.  Patient complaining of right hip pain.  Denies loss of consciousness.  Patient lives alone and utilizes a walker for ambulation at baseline.  Patient reports very painful to walk following a fall.  No other specific complaints.  Denied headache, no dizziness, no chest pain, no palpitations, no shortness of breath, no abdominal pain.  In the ED, temperature 98.2 F, HR 92, RR 18, BP 144/100, SpO2 97% on room air.  WBC 8.7, hemoglobin 13.5, platelets 202.  Sodium 138, potassium 3.8, chloride 106, CO2 24, glucose 108, BUN 25, creatinine 1.03.  AST 12, ALT 10, total bilirubin 1.5.  Urinalysis unrevealing.  Chest x-ray with no active cardiopulmonary disease process.  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  Right femur with subtle fracture involving the inferior pubic rami on the right, screw fragment along the distal femoral metaphysis, noted right hip arthroplasty.  CT pelvis without contrast with nondisplaced superior and inferior pubic rami fracture on right side, small nondisplaced right sacral fractures anteriorly, intact right hip prosthesis.  Was seen by orthopedics, Dr. Christell Constant; with no recommendation of need for operative intervention with recommendation of weightbearing as tolerates and therapy  evaluation.  TRH was consulted for admission for further evaluation management of right pubic rami/sacral fracture.  Assessment & Plan:   Right pubic rami/sacral fracture Patient presenting to ED follow mechanical fall at home with right sided hip pain.  Imaging notable for nondisplaced superior/inferior pubic rami fracture on the right and small nondisplaced right sacral fracture.  Evaluated by orthopedics, Dr. Christell Constant; no surgical intervention required with recommendation of weightbearing as tolerates, pain control and therapy evaluation with outpatient follow-up in 2 weeks. -- WBAT BLE -- Oxycodone 5-10 mg PO q6h PRN moderate pain -- Morphine 2 mg IV every 4 hours as needed severe pain -- PT/OT currently recommending SNF placement, TOC consulted; pending discharge to [facility] SNF on Monday -- Continue therapy efforts while inpatient  Major depressive disorder/general anxiety disorder -- BuSpar 7.5 mg p.o. daily -- Wellbutrin 300 mg p.o. daily -- Clonazepam 0.5 mg p.o. 3 times daily as needed anxiety  Vitamin D deficiency Vitamin D 25-hydroxy level 25. --Cholecalciferol 800 units p.o. daily   DVT prophylaxis: enoxaparin (LOVENOX) injection 40 mg Start: 03/29/23 2200    Code Status: Full Code Family Communication: No family present at bedside this morning  Disposition Plan:  Level of care: Med-Surg Status is: Inpatient Remains inpatient appropriate because: Pending discharge to [facility] SNF tomorrow    Consultants:  Orthopedics, Dr. Christell Constant  Procedures:  None  Antimicrobials:  None   Subjective: Patient seen examined bedside, resting comfortably.  Lying in bed.  Eating breakfast.  Pain currently controlled.  No questions or concerns at this time.  Denies headache, no dizziness, no chest pain, no palpitations, no fever/chills/night sweats,  no nausea/vomiting/diarrhea, no cough/congestion, no abdominal pain, no focal weakness, no fatigue, no paresthesias.  No acute events  overnight per nursing staff.  Plan discharge to [facility] SNF tomorrow from.  Objective: Vitals:   04/02/23 0516 04/02/23 1221 04/02/23 2235 04/03/23 0540  BP: 124/62 119/73 (!) 143/80 125/67  Pulse: 87 83 87 80  Resp: 18 18 15 14   Temp: 98 F (36.7 C) 98 F (36.7 C) 98.4 F (36.9 C) 97.9 F (36.6 C)  TempSrc:  Oral    SpO2: 98% 100% 98% 97%  Weight:      Height:        Intake/Output Summary (Last 24 hours) at 04/03/2023 1227 Last data filed at 04/02/2023 2235 Gross per 24 hour  Intake 120 ml  Output 900 ml  Net -780 ml   Filed Weights   03/31/23 1630  Weight: 88.7 kg    Examination:  Physical Exam: GEN: NAD, alert and oriented x 3, wd/wn HEENT: NCAT, PERRL, EOMI, sclera clear, MMM PULM: CTAB w/o wheezes/crackles, normal respiratory effort, on room air CV: RRR w/o M/G/R GI: abd soft, NTND, NABS, no R/G/M MSK: no peripheral edema, muscle strength globally intact 5/5 bilateral upper/lower extremities NEURO: CN II-XII intact, no focal deficits, sensation to light touch intact PSYCH: normal mood/affect Integumentary: dry/intact, no rashes or wounds    Data Reviewed: I have personally reviewed following labs and imaging studies  CBC: Recent Labs  Lab 03/29/23 1050  WBC 8.7  NEUTROABS 6.2  HGB 13.5  HCT 40.3  MCV 93.1  PLT 202   Basic Metabolic Panel: Recent Labs  Lab 03/29/23 1050  NA 138  K 3.8  CL 106  CO2 24  GLUCOSE 108*  BUN 25*  CREATININE 1.03*  CALCIUM 9.6   GFR: Estimated Creatinine Clearance: 51.7 mL/min (A) (by C-G formula based on SCr of 1.03 mg/dL (H)). Liver Function Tests: Recent Labs  Lab 03/29/23 1050  AST 12*  ALT 10  ALKPHOS 83  BILITOT 1.1  PROT 7.0  ALBUMIN 4.1   No results for input(s): "LIPASE", "AMYLASE" in the last 168 hours. No results for input(s): "AMMONIA" in the last 168 hours. Coagulation Profile: No results for input(s): "INR", "PROTIME" in the last 168 hours. Cardiac Enzymes: No results for input(s):  "CKTOTAL", "CKMB", "CKMBINDEX", "TROPONINI" in the last 168 hours. BNP (last 3 results) No results for input(s): "PROBNP" in the last 8760 hours. HbA1C: No results for input(s): "HGBA1C" in the last 72 hours. CBG: No results for input(s): "GLUCAP" in the last 168 hours. Lipid Profile: No results for input(s): "CHOL", "HDL", "LDLCALC", "TRIG", "CHOLHDL", "LDLDIRECT" in the last 72 hours. Thyroid Function Tests: No results for input(s): "TSH", "T4TOTAL", "FREET4", "T3FREE", "THYROIDAB" in the last 72 hours. Anemia Panel: No results for input(s): "VITAMINB12", "FOLATE", "FERRITIN", "TIBC", "IRON", "RETICCTPCT" in the last 72 hours. Sepsis Labs: No results for input(s): "PROCALCITON", "LATICACIDVEN" in the last 168 hours.  No results found for this or any previous visit (from the past 240 hour(s)).       Radiology Studies: No results found.      Scheduled Meds:  anastrozole  1 mg Oral Daily   buPROPion  300 mg Oral Daily   busPIRone  7.5 mg Oral Daily   cholecalciferol  800 Units Oral Daily   enoxaparin (LOVENOX) injection  40 mg Subcutaneous Q24H   loratadine  10 mg Oral Daily   meloxicam  15 mg Oral Daily   polyethylene glycol  34 g Oral Daily  senna-docusate  2 tablet Oral Daily   sodium chloride flush  3 mL Intravenous Q12H   Continuous Infusions:  sodium chloride       LOS: 5 days    Time spent: 43 minutes spent on chart review, discussion with nursing staff, consultants, updating family and interview/physical exam; more than 50% of that time was spent in counseling and/or coordination of care.    Alvira Philips [place], DO [facility] Available via Epic secure chat 7am-7pm After these hours, please refer to coverage provider listed on [email or web address] 04/03/2023, 12:27 PM

## 2023-04-03 NOTE — Plan of Care (Signed)

## 2023-04-04 DIAGNOSIS — K219 Gastro-esophageal reflux disease without esophagitis: Secondary | ICD-10-CM | POA: Diagnosis not present

## 2023-04-04 DIAGNOSIS — S32591A Other specified fracture of right pubis, initial encounter for closed fracture: Secondary | ICD-10-CM | POA: Diagnosis not present

## 2023-04-04 DIAGNOSIS — J309 Allergic rhinitis, unspecified: Secondary | ICD-10-CM | POA: Diagnosis not present

## 2023-04-04 DIAGNOSIS — Z7401 Bed confinement status: Secondary | ICD-10-CM | POA: Diagnosis not present

## 2023-04-04 DIAGNOSIS — M810 Age-related osteoporosis without current pathological fracture: Secondary | ICD-10-CM | POA: Diagnosis not present

## 2023-04-04 DIAGNOSIS — M159 Polyosteoarthritis, unspecified: Secondary | ICD-10-CM | POA: Diagnosis not present

## 2023-04-04 DIAGNOSIS — S32599D Other specified fracture of unspecified pubis, subsequent encounter for fracture with routine healing: Secondary | ICD-10-CM | POA: Diagnosis not present

## 2023-04-04 DIAGNOSIS — F411 Generalized anxiety disorder: Secondary | ICD-10-CM | POA: Diagnosis not present

## 2023-04-04 DIAGNOSIS — E559 Vitamin D deficiency, unspecified: Secondary | ICD-10-CM | POA: Diagnosis not present

## 2023-04-04 DIAGNOSIS — R410 Disorientation, unspecified: Secondary | ICD-10-CM | POA: Diagnosis not present

## 2023-04-04 DIAGNOSIS — F419 Anxiety disorder, unspecified: Secondary | ICD-10-CM | POA: Diagnosis not present

## 2023-04-04 DIAGNOSIS — B372 Candidiasis of skin and nail: Secondary | ICD-10-CM | POA: Diagnosis not present

## 2023-04-04 DIAGNOSIS — S3210XA Unspecified fracture of sacrum, initial encounter for closed fracture: Secondary | ICD-10-CM | POA: Diagnosis not present

## 2023-04-04 DIAGNOSIS — C50811 Malignant neoplasm of overlapping sites of right female breast: Secondary | ICD-10-CM | POA: Diagnosis not present

## 2023-04-04 DIAGNOSIS — K589 Irritable bowel syndrome without diarrhea: Secondary | ICD-10-CM | POA: Diagnosis not present

## 2023-04-04 DIAGNOSIS — S329XXA Fracture of unspecified parts of lumbosacral spine and pelvis, initial encounter for closed fracture: Secondary | ICD-10-CM | POA: Diagnosis not present

## 2023-04-04 DIAGNOSIS — R296 Repeated falls: Secondary | ICD-10-CM | POA: Diagnosis not present

## 2023-04-04 DIAGNOSIS — S32592A Other specified fracture of left pubis, initial encounter for closed fracture: Secondary | ICD-10-CM | POA: Diagnosis not present

## 2023-04-04 DIAGNOSIS — S72009A Fracture of unspecified part of neck of unspecified femur, initial encounter for closed fracture: Secondary | ICD-10-CM | POA: Diagnosis not present

## 2023-04-04 DIAGNOSIS — M25551 Pain in right hip: Secondary | ICD-10-CM | POA: Diagnosis not present

## 2023-04-04 DIAGNOSIS — R2689 Other abnormalities of gait and mobility: Secondary | ICD-10-CM | POA: Diagnosis not present

## 2023-04-04 DIAGNOSIS — H10813 Pingueculitis, bilateral: Secondary | ICD-10-CM | POA: Diagnosis not present

## 2023-04-04 DIAGNOSIS — Z7689 Persons encountering health services in other specified circumstances: Secondary | ICD-10-CM | POA: Diagnosis not present

## 2023-04-04 DIAGNOSIS — E785 Hyperlipidemia, unspecified: Secondary | ICD-10-CM | POA: Diagnosis not present

## 2023-04-04 DIAGNOSIS — F339 Major depressive disorder, recurrent, unspecified: Secondary | ICD-10-CM | POA: Diagnosis not present

## 2023-04-04 DIAGNOSIS — M6281 Muscle weakness (generalized): Secondary | ICD-10-CM | POA: Diagnosis not present

## 2023-04-04 MED ORDER — OXYCODONE HCL 5 MG PO TABS: 5.0000 mg | ORAL_TABLET | Freq: Four times a day (QID) | ORAL | 0 refills | Status: DC | PRN

## 2023-04-04 MED ORDER — CHOLECALCIFEROL 10 MCG (400 UNIT) PO TABS: 800.0000 [IU] | ORAL_TABLET | Freq: Every day | ORAL | 0 refills | Status: DC

## 2023-04-04 MED ORDER — CHOLECALCIFEROL 10 MCG (400 UNIT) PO TABS: 800.0000 [IU] | ORAL_TABLET | Freq: Every day | ORAL | 0 refills | Status: AC

## 2023-04-04 MED ORDER — CLONAZEPAM 0.5 MG PO TABS: 0.5000 mg | ORAL_TABLET | Freq: Three times a day (TID) | ORAL | 0 refills | Status: DC | PRN

## 2023-04-04 NOTE — Care Management Important Message (Signed)
Important Message  Patient Details IM Letter given to the Patient. Name: Emily Horton MRN: [id number] Date of Birth: 1946-04-28   Medicare Important Message Given:  Yes     [name] 04/04/2023, 9:27 AM

## 2023-04-04 NOTE — Discharge Summary (Signed)
Physician Discharge Summary  [name] XBM:[id number] DOB: 1946-07-07 DOA: 03/29/2023  PCP: [name], MD  Admit date: 03/29/2023 Discharge date: 04/04/2023  Admitted From: Home Disposition: [facility]  Recommendations for Outpatient Follow-up:  Follow up with PCP in 1-2 weeks Follow-up with orthopedics 2 weeks for pubic rami/sacral fracture Started on cholecalciferol for vitamin D deficiency  Discharge Condition: Stable CODE STATUS: Full code Diet recommendation: Regular diet  History of present illness:  Emily Horton is a 77 y.o. female with past medical history significant for breast cancer, osteoporosis, history of hip fracture, MDD/GAD who presented to [facility] ED on 4/23 from home via EMS following mechanical fall at home.  Patient reports was walking to her room in which her feet gave out and fell on her right hip.  Patient complaining of right hip pain.  Denies loss of consciousness.  Patient lives alone and utilizes a walker for ambulation at baseline.  Patient reports very painful to walk following a fall.  No other specific complaints.  Denied headache, no dizziness, no chest pain, no palpitations, no shortness of breath, no abdominal pain.   In the ED, temperature 98.2 F, HR 92, RR 18, BP 144/100, SpO2 97% on room air.  WBC 8.7, hemoglobin 13.5, platelets 202.  Sodium 138, potassium 3.8, chloride 106, CO2 24, glucose 108, BUN 25, creatinine 1.03.  AST 12, ALT 10, total bilirubin 1.5.  Urinalysis unrevealing.  Chest x-ray with no active cardiopulmonary disease process.  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  Right femur with subtle fracture involving the inferior pubic rami on the right, screw fragment  along the distal femoral metaphysis, noted right hip arthroplasty.  CT pelvis without contrast with nondisplaced superior and inferior pubic rami fracture on right side, small nondisplaced right sacral fractures anteriorly, intact right hip prosthesis.  Was seen by orthopedics, Dr. Christell Constant; with no recommendation of need for operative intervention with recommendation of weightbearing as tolerates and therapy evaluation.  TRH was consulted for admission for further evaluation management of right pubic rami/sacral fracture.    Hospital course:  Right pubic rami/sacral fracture Patient presenting to ED follow mechanical fall at home with right sided hip pain.  Imaging notable for nondisplaced superior/inferior pubic rami fracture on the right and small nondisplaced right sacral fracture.  Evaluated by orthopedics, Dr. Christell Constant; no surgical intervention required with recommendation of weightbearing as tolerates, pain control and therapy evaluation with outpatient follow-up in 2 weeks.  Continue weightbearing as tolerates bilateral lower extremities.  Continue oxycodone as needed for pain control.  Discharging to SNF for further rehabilitation.   Major depressive disorder/general anxiety disorder BuSpar 7.5 mg p.o. daily, Wellbutrin 300 mg p.o. daily, Clonazepam 0.5 mg p.o. 3 times daily as needed anxiety   Vitamin D deficiency Vitamin D 25-hydroxy level 25. Cholecalciferol 800 units p.o. daily  Discharge Diagnoses:  Principal Problem:   Pubic ramus fracture (HCC) Active Problems:   Ductal carcinoma in situ (DCIS) of right breast   Osteoporosis   Fall    Discharge Instructions  Discharge Instructions     Call MD for:  difficulty breathing, headache or visual disturbances   Complete by: As directed    Call MD for:  extreme fatigue   Complete by: As directed  Call MD for:  persistant dizziness or light-headedness   Complete by: As directed    Call MD for:  persistant nausea and vomiting    Complete by: As directed    Call MD for:  severe uncontrolled pain   Complete by: As directed    Call MD for:  temperature >100.4   Complete by: As directed    Diet - low sodium heart healthy   Complete by: As directed    Increase activity slowly   Complete by: As directed       Allergies as of 04/04/2023       Reactions   Egg-derived Products Hives   Zoloft [sertraline Hcl] Other (See Comments)   Headaches         Medication List     TAKE these medications    anastrozole 1 MG tablet Commonly known as: ARIMIDEX Take 1 tablet (1 mg total) by mouth daily.   Biofreeze 4 % Gel Generic drug: Menthol (Topical Analgesic) Apply 1 Application topically as needed (pain).   buPROPion 300 MG 24 hr tablet Commonly known as: WELLBUTRIN XL TAKE 1 TABLET(300 MG) BY MOUTH EVERY MORNING What changed: See the new instructions.   busPIRone 15 MG tablet Commonly known as: BUSPAR TAKE 1 TABLET(15 MG) BY MOUTH TWICE DAILY What changed: See the new instructions.   cetirizine 10 MG tablet Commonly known as: ZYRTEC TAKE 1 TABLET(10 MG) BY MOUTH DAILY What changed: See the new instructions.   cholecalciferol 10 MCG (400 UNIT) Tabs tablet Commonly known as: VITAMIN D3 Take 2 tablets (800 Units total) by mouth daily.   clonazePAM 0.5 MG tablet Commonly known as: KLONOPIN Take 1 tablet (0.5 mg total) by mouth 3 (three) times daily as needed for anxiety. What changed: when to take this   denosumab 60 MG/ML Sosy injection Commonly known as: PROLIA Inject 60 mg into the skin every 6 (six) months.   EPINEPHrine 0.3 mg/0.3 mL Soaj injection Commonly known as: EPI-PEN Inject 0.3 mLs (0.3 mg total) into the muscle daily as needed for anaphylaxis.   ibuprofen 200 MG tablet Commonly known as: ADVIL Take 200-400 mg by mouth as needed for headache or moderate pain.   meloxicam 15 MG tablet Commonly known as: MOBIC TAKE 1 TABLET(15 MG) BY MOUTH DAILY What changed: See the new  instructions.   methocarbamol 500 MG tablet Commonly known as: ROBAXIN Take 1 tablet (500 mg total) by mouth every 6 (six) hours as needed for muscle spasms.   omeprazole 40 MG capsule Commonly known as: PRILOSEC Take 1 capsule (40 mg total) by mouth 2 (two) times daily. What changed: when to take this   ondansetron 4 MG tablet Commonly known as: Zofran Take 1 tablet (4 mg total) by mouth every 8 (eight) hours as needed for nausea or vomiting. What changed: when to take this   oxyCODONE 5 MG immediate release tablet Commonly known as: Oxy IR/ROXICODONE Take 1-2 tablets (5-10 mg total) by mouth every 6 (six) hours as needed for moderate pain or severe pain. What changed: reasons to take this   polyethylene glycol 17 g packet Commonly known as: MIRALAX / GLYCOLAX Take 17 g by mouth daily as needed for moderate constipation or severe constipation.   sennosides-docusate sodium 8.6-50 MG tablet Commonly known as: SENOKOT-S Take 1-2 tablets by mouth as needed for constipation.   triamcinolone cream 0.1 % Commonly known as: KENALOG Apply 1 application topically 2 (two) times daily. What changed:  when to take this  reasons to take this        Contact information for follow-up providers     [name], MD. Schedule an appointment as soon as possible for a visit in 1 week(s).   Specialty: Family Medicine Contact information: [address] [place] [address] [phone]         London Sheer, MD. Schedule an appointment as soon as possible for a visit in 2 week(s).   Specialty: Orthopedic Surgery Why: F/U pubic rami and sacral fracture Contact information: [address] [place] [address] [phone]              Contact information for after-discharge care     Destination     [facility] .   Service: Skilled Nursing Contact information: [address] Test Update Address [place]  [address] [phone]                    Allergies  Allergen Reactions   Egg-Derived Products Hives   Zoloft [Sertraline Hcl] Other (See Comments)    Headaches     Consultations: Orthopedics, Dr. Christell Constant   Procedures/Studies: DG FEMUR, MIN 2 VIEWS RIGHT  Result Date: 03/29/2023 CLINICAL DATA:  Pain EXAM: RIGHT FEMUR 2 VIEWS COMPARISON:  Pelvic CT 03/29/2023.  Older x-rays FINDINGS: Right hip arthroplasty identified with screw fixated acetabular cup and Press-Fit components. Underlying osteopenia. No dislocation. There is some deformity of the right inferior pubic ramus consistent with known injury. The superior pubic ramus fracture is not well seen on this x-ray. No hardware failure. There is also a focal screw fragment along the distal femoral metaphysis with some benign periosteal reaction. IMPRESSION: Right hip arthroplasty. Subtle fracture involving the inferior pubic ramus on the right. The superior pubic ramus fracture by CT is not well seen on this x-ray. Screw fragment along the distal femoral metaphysis. Please correlate with clinical history Electronically Signed   By: Karen Kays M.D.   On: 03/29/2023 18:39   CT PELVIS WO CONTRAST  Result Date: 03/29/2023 CLINICAL DATA:  Larey Seat.  Possible pelvic fractures. EXAM: CT PELVIS WITHOUT CONTRAST TECHNIQUE: Multidetector CT imaging of the pelvis was performed following the standard protocol without intravenous contrast. RADIATION DOSE REDUCTION: This exam was performed according to the departmental dose-optimization program which includes automated exposure control, adjustment of the mA and/or kV according to patient size and/or use of iterative reconstruction technique. COMPARISON:  09/03/2015 FINDINGS: Both hips are normally located. The right hip prosthesis is intact. No periprosthetic fracture. There are nondisplaced superior and inferior pubic rami fractures on the right side as suggested the radiographs. The pubic symphysis and SI  joints are intact. Small nondisplaced right sacral fractures are noted anteriorly. The hip and pelvic musculature are grossly normal by CT. There are surgical changes noted in the subcutaneous fat overlying the right hip. No significant intrapelvic abnormalities are identified. IMPRESSION: 1. Nondisplaced superior and inferior pubic rami fractures on the right side as suggested on the radiographs. 2. Small nondisplaced right sacral fractures anteriorly. 3. Intact right hip prosthesis. Electronically Signed   By: Rudie Meyer M.D.   On: 03/29/2023 16:39   DG Chest Port 1 View  Result Date: 03/29/2023 CLINICAL DATA:  Fall. EXAM: PORTABLE CHEST 1 VIEW COMPARISON:  06/21/2019 FINDINGS: The lungs are clear without focal pneumonia, edema, pneumothorax or pleural effusion. The cardiopericardial silhouette is within normal limits for size. No acute bony abnormality. Telemetry leads overlie the chest. IMPRESSION: No active disease.  Electronically Signed   By: Kennith Center M.D.   On: 03/29/2023 11:58   DG Hip Unilat W or Wo Pelvis 2-3 Views Right  Result Date: 03/29/2023 CLINICAL DATA:  Fall pain EXAM: DG HIP (WITH OR WITHOUT PELVIS) 2-3V RIGHT COMPARISON:  03/31/2022 FINDINGS: Osteopenia. Status post right hip total arthroplasty. Chronic fracture deformity of the underlying proximal right femur without acute displaced fracture of the right hip. Nondisplaced fractures of the superior and inferior right pubic rami. No evident fractures of the left hemipelvis or proximal left femur seen in single frontal view. Nonobstructive pattern of overlying bowel gas. IMPRESSION: 1. Nondisplaced fractures of the superior and inferior right pubic rami. Consider CT to further evaluate. 2. Status post right hip total arthroplasty. Chronic fracture deformity of the underlying proximal right femur without acute displaced fracture of the right hip. 3. No evident fractures of the left hemipelvis or proximal left femur seen in single  frontal view. Electronically Signed   By: Jearld Lesch M.D.   On: 03/29/2023 11:43     Subjective: Patient seen at bedside, resting calmly.  Lying in bed.  No specific complaints this morning.  Pain currently controlled.  Discharging to SNF for further debilitation today.  No other questions or concerns at this time.  Denies headache, no dizziness, no visual changes, no chest pain, no shortness of breath, no abdominal pain, no fever/chills/night sweats, no nausea/vomiting/diarrhea, no focal weakness, no fatigue, no paresthesias.  No acute events overnight per nursing staff.  Discharge Exam: Vitals:   04/03/23 2101 04/04/23 0428  BP: 117/68 132/73  Pulse: 74 75  Resp: 16 18  Temp: 97.7 F (36.5 C) (!) 97.5 F (36.4 C)  SpO2: 97% 100%   Vitals:   04/03/23 0540 04/03/23 1435 04/03/23 2101 04/04/23 0428  BP: 125/67 109/67 117/68 132/73  Pulse: 80 73 74 75  Resp: 14 15 16 18   Temp: 97.9 F (36.6 C) 97.8 F (36.6 C) 97.7 F (36.5 C) (!) 97.5 F (36.4 C)  TempSrc:  Oral Oral Oral  SpO2: 97% 98% 97% 100%  Weight:      Height:        Physical Exam: GEN: NAD, alert and oriented x 3, wd/wn HEENT: NCAT, PERRL, EOMI, sclera clear, MMM PULM: CTAB w/o wheezes/crackles, normal respiratory effort CV: RRR w/o M/G/R GI: abd soft, NTND, NABS, no R/G/M MSK: no peripheral edema, muscle strength globally intact 5/5 bilateral upper/lower extremities NEURO: CN II-XII intact, no focal deficits, sensation to light touch intact PSYCH: normal mood/affect Integumentary: dry/intact, no rashes or wounds    The results of significant diagnostics from this hospitalization (including imaging, microbiology, ancillary and laboratory) are listed below for reference.     Microbiology: No results found for this or any previous visit (from the past 240 hour(s)).   Labs: BNP (last 3 results) No results for input(s): "BNP" in the last 8760 hours. Basic Metabolic Panel: Recent Labs  Lab 03/29/23 1050   NA 138  K 3.8  CL 106  CO2 24  GLUCOSE 108*  BUN 25*  CREATININE 1.03*  CALCIUM 9.6   Liver Function Tests: Recent Labs  Lab 03/29/23 1050  AST 12*  ALT 10  ALKPHOS 83  BILITOT 1.1  PROT 7.0  ALBUMIN 4.1   No results for input(s): "LIPASE", "AMYLASE" in the last 168 hours. No results for input(s): "AMMONIA" in the last 168 hours. CBC: Recent Labs  Lab 03/29/23 1050  WBC 8.7  NEUTROABS 6.2  HGB 13.5  HCT 40.3  MCV 93.1  PLT 202   Cardiac Enzymes: No results for input(s): "CKTOTAL", "CKMB", "CKMBINDEX", "TROPONINI" in the last 168 hours. BNP: Invalid input(s): "POCBNP" CBG: No results for input(s): "GLUCAP" in the last 168 hours. D-Dimer No results for input(s): "DDIMER" in the last 72 hours. Hgb A1c No results for input(s): "HGBA1C" in the last 72 hours. Lipid Profile No results for input(s): "CHOL", "HDL", "LDLCALC", "TRIG", "CHOLHDL", "LDLDIRECT" in the last 72 hours. Thyroid function studies No results for input(s): "TSH", "T4TOTAL", "T3FREE", "THYROIDAB" in the last 72 hours.  Invalid input(s): "FREET3" Anemia work up No results for input(s): "VITAMINB12", "FOLATE", "FERRITIN", "TIBC", "IRON", "RETICCTPCT" in the last 72 hours. Urinalysis    Component Value Date/Time   COLORURINE STRAW (A) 03/29/2023 1101   APPEARANCEUR CLEAR 03/29/2023 1101   LABSPEC 1.008 03/29/2023 1101   PHURINE 7.0 03/29/2023 1101   GLUCOSEU NEGATIVE 03/29/2023 1101   GLUCOSEU NEGATIVE 09/03/2015 0942   HGBUR SMALL (A) 03/29/2023 1101   HGBUR trace-lysed 08/28/2010 0803   BILIRUBINUR NEGATIVE 03/29/2023 1101   BILIRUBINUR neg 12/11/2021 1046   KETONESUR NEGATIVE 03/29/2023 1101   PROTEINUR NEGATIVE 03/29/2023 1101   UROBILINOGEN 0.2 12/11/2021 1046   UROBILINOGEN 0.2 09/03/2015 0942   NITRITE NEGATIVE 03/29/2023 1101   LEUKOCYTESUR NEGATIVE 03/29/2023 1101   Sepsis Labs Recent Labs  Lab 03/29/23 1050  WBC 8.7   Microbiology No results found for this or any  previous visit (from the past 240 hour(s)).   Time coordinating discharge: Over 30 minutes  SIGNED:   Alvira Philips [place], DO  [facility] 04/04/2023, 8:03 AM

## 2023-04-04 NOTE — TOC Transition Note (Signed)
Transition of Care [facility]) - CM/SW Discharge Note   Patient Details  Name: Emily Horton MRN: [id number] Date of Birth: 1946-09-14  [facility] of [facility]) CM/SW Contact:  Howell Rucks, RN Phone Number: 04/04/2023, 10:28 AM   Clinical Narrative:   DC order and summary in place for transfer to [facility] SNF. Outreach to [employer] via text. Confirmed RM [id number], Nurse report [phone] [id number]. [facility] called for transport. No further TOC needs.    Final next level of care: Skilled Nursing Facility Barriers to Discharge: No Barriers Identified   Patient Goals and CMS Choice CMS Medicare.gov Compare Post Acute Care list provided to:: Patient Choice offered to / list presented to : Patient  Discharge Placement                Patient chooses bed at:  ([facility]) Patient to be transferred to facility by: [facility] Name of family member notified: [name] (dtr) Patient and family notified of of transfer: 04/04/23  Discharge Plan and Services Additional resources added to the After Visit Summary for   In-house Referral: Clinical Social Work Discharge Planning Services: NA Post Acute Care Choice: Skilled Nursing Facility          DME Arranged: N/A DME Agency: NA                  Social Determinants of Health (SDOH) Interventions SDOH Screenings   Food Insecurity: No Food Insecurity (03/29/2023)  Housing: Low Risk  (03/29/2023)  Transportation Needs: No Transportation Needs (03/29/2023)  Utilities: Not At Risk (03/29/2023)  Alcohol Screen: Low Risk  (05/11/2022)  Depression (PHQ2-9): High Risk (05/21/2022)  Financial Resource Strain: Low Risk  (05/11/2022)  Physical Activity: Inactive (05/11/2022)  Social Connections: Moderately Integrated (05/11/2022)  Stress: No Stress Concern Present (05/11/2022)  Tobacco Use: High Risk (03/29/2023)     Readmission Risk Interventions    03/30/2023   11:46 AM  Readmission Risk Prevention Plan  Post Dischage Appt Complete  Medication  Screening Complete  Transportation Screening Complete

## 2023-04-04 NOTE — Plan of Care (Signed)

## 2023-04-07 DIAGNOSIS — F411 Generalized anxiety disorder: Secondary | ICD-10-CM | POA: Diagnosis not present

## 2023-04-07 DIAGNOSIS — C50811 Malignant neoplasm of overlapping sites of right female breast: Secondary | ICD-10-CM | POA: Diagnosis not present

## 2023-04-07 DIAGNOSIS — F339 Major depressive disorder, recurrent, unspecified: Secondary | ICD-10-CM | POA: Diagnosis not present

## 2023-04-07 DIAGNOSIS — E785 Hyperlipidemia, unspecified: Secondary | ICD-10-CM | POA: Diagnosis not present

## 2023-04-14 ENCOUNTER — Other Ambulatory Visit: Payer: Self-pay | Admitting: *Deleted

## 2023-04-14 NOTE — Patient Outreach (Signed)
Emily Horton resides in [place]  skilled nursing facility.  Screening for potential [facility] care coordination services as a benefit of health plan and primary care provider.  Facility site visit to [facility] skilled nursing facility. Met with Emily Horton at bedside. Emily Horton reports she lives alone. States transition plan may be ALF post SNF. Reports her daughter [name] is primary contact.   Left [facility] Care Management brochure and writer's contact information.   Raiford Noble, MSN, RN,BSN [facility] Post Acute Care Coordinator [phone] (Direct dial)

## 2023-04-18 ENCOUNTER — Ambulatory Visit (INDEPENDENT_AMBULATORY_CARE_PROVIDER_SITE_OTHER): Payer: Medicare Other | Admitting: Orthopedic Surgery

## 2023-04-18 ENCOUNTER — Other Ambulatory Visit (INDEPENDENT_AMBULATORY_CARE_PROVIDER_SITE_OTHER): Payer: Medicare Other

## 2023-04-18 DIAGNOSIS — S329XXA Fracture of unspecified parts of lumbosacral spine and pelvis, initial encounter for closed fracture: Secondary | ICD-10-CM | POA: Diagnosis not present

## 2023-04-18 MED ORDER — ALENDRONATE SODIUM 70 MG PO TABS: 70.0000 mg | ORAL_TABLET | ORAL | 0 refills | Status: AC

## 2023-04-18 NOTE — Progress Notes (Signed)
Orthopedic Surgery Progress Note   Assessment: Patient is a 77 y.o. female with right sacral ala and right inferior/superior pubic rami fractures Date of injury: 03/29/2023 (~3 weeks from injury)   Plan: -No acute operative intervention -Weightbearing as tolerated bilaterally -Explained that her fracture gives her the diagnosis of osteoporosis.  She had previously been on Prolia but has not been on in a long time.  Prescribed Fosamax to prevent future fragility fractures -Continue with PT -Pain control -Follow-up in 4 weeks with AP/inlet/outlet pelvis x-rays  ___________________________________________________________________________  Subjective: Patient sustained a pelvic ring injury and was admitted to the hospital.  I had seen her as a consult at the hospital.  She returns today for routine follow-up.  She was discharged to a SNF.  She said her pain has been getting better with time.  She is taking less oxycodone and taking it more frequently.  She is ambulating with a walker.  She has noted her ability to ambulate further with time.  She is still having pain in her groin on the right side.  No pain elsewhere in the lower extremities.  She is not having any pain radiating down either lower extremity.   Physical Exam:  General: no acute distress, appears stated age Neurologic: alert, answering questions appropriately, following commands Respiratory: unlabored breathing on room air, symmetric chest rise Psychiatric: appropriate affect, normal cadence to speech  MSK:   -Right lower extremity  No tenderness to palpation over extremity, no pain with log roll, no gross deformity EHL/TA/GSC intact Plantar flexes and dorsiflexes toes Sensation intact to light touch in sural, saphenous, tibial, deep peroneal, and superficial peroneal nerve distributions Foot warm and well perfused  Imaging: XR of the pelvis from 04/18/2023 was independently reviewed and interpreted, showing nondisplaced  inferior and superior ramus fractures.  Fracture not well-visualized on radiographs.  No interval displacement from her x-rays on 03/29/2023.  Right total hip arthroplasty appears in similar position to prior films.  Patient name: Emily Horton Patient MRN: [id number] Date: 04/18/23

## 2023-04-21 DIAGNOSIS — Z7689 Persons encountering health services in other specified circumstances: Secondary | ICD-10-CM

## 2023-04-21 DIAGNOSIS — B372 Candidiasis of skin and nail: Secondary | ICD-10-CM

## 2023-04-28 DIAGNOSIS — M25551 Pain in right hip: Secondary | ICD-10-CM | POA: Diagnosis not present

## 2023-05-16 ENCOUNTER — Ambulatory Visit (INDEPENDENT_AMBULATORY_CARE_PROVIDER_SITE_OTHER): Payer: Medicare Other | Admitting: Orthopedic Surgery

## 2023-05-16 ENCOUNTER — Other Ambulatory Visit: Payer: Self-pay | Admitting: *Deleted

## 2023-05-16 ENCOUNTER — Ambulatory Visit: Payer: Medicare Other | Admitting: Orthopedic Surgery

## 2023-05-16 ENCOUNTER — Other Ambulatory Visit (INDEPENDENT_AMBULATORY_CARE_PROVIDER_SITE_OTHER): Payer: Medicare Other

## 2023-05-16 DIAGNOSIS — S329XXA Fracture of unspecified parts of lumbosacral spine and pelvis, initial encounter for closed fracture: Secondary | ICD-10-CM

## 2023-05-16 NOTE — Progress Notes (Signed)
Orthopedic Surgery Progress Note     Assessment: Patient is a 77 y.o. female with right sacral ala and right inferior/superior pubic rami fractures Date of injury: 03/29/2023 (~6 weeks from injury)     Plan: -No acute operative intervention -Weightbearing as tolerated bilaterally -Continue with PT, work on ambulation -Pain control -Follow-up in 6 weeks with AP/inlet/outlet pelvis x-rays   ___________________________________________________________________________   Subjective: Patient sustained a pelvic ring injury and was admitted to the hospital.  Patient was discharged to a SNF after the hospital.  She is still at the skilled nursing facility.  She is not having any pain in her hip or groin.  She has ambulated short distance, but is afraid and feels that that is her biggest limiter right now.  She is scared of another fall.  Denies paresthesias and numbness.     Physical Exam:   General: no acute distress, appears stated age Neurologic: alert, answering questions appropriately, following commands Respiratory: unlabored breathing on room air, symmetric chest rise Psychiatric: appropriate affect, normal cadence to speech   MSK:    -Right lower extremity             No tenderness to palpation over extremity, no pain with log roll, no gross deformity EHL/TA/GSC intact Plantar flexes and dorsiflexes toes Sensation intact to light touch in sural, saphenous, tibial, deep peroneal, and superficial peroneal nerve distributions Foot warm and well perfused   Imaging: XR of the pelvis from 05/16/2023 was independently reviewed and interpreted, showing nondisplaced inferior and superior ramus fractures.  No interval displacement from her x-rays on 03/29/2023.  Right total hip arthroplasty appears in similar position to prior films. No new fractures seen.    Patient name: Emily Horton Patient MRN: [id number] Date: 05/16/23

## 2023-05-16 NOTE — Patient Outreach (Signed)
Mrs. Ranes  resides in [place] skilled nursing facility.  Screening for potential [facility] care coordination services as a benefit of health plan and primary care provider.  Spoke with [place], Geophysicist/field seismologist. Anticipated transition plan is for ALF. Care plan meeting scheduled for this week.   Will continue to follow.   Raiford Noble, MSN, RN,BSN [facility] Post Acute Care Coordinator [phone] (Direct dial)

## 2023-06-07 DIAGNOSIS — Z7689 Persons encountering health services in other specified circumstances: Secondary | ICD-10-CM | POA: Diagnosis not present

## 2023-06-07 DIAGNOSIS — S32599D Other specified fracture of unspecified pubis, subsequent encounter for fracture with routine healing: Secondary | ICD-10-CM | POA: Diagnosis not present

## 2023-06-07 DIAGNOSIS — R2689 Other abnormalities of gait and mobility: Secondary | ICD-10-CM | POA: Diagnosis not present

## 2023-06-07 DIAGNOSIS — R296 Repeated falls: Secondary | ICD-10-CM | POA: Diagnosis not present

## 2023-06-07 DIAGNOSIS — R410 Disorientation, unspecified: Secondary | ICD-10-CM | POA: Diagnosis not present

## 2023-06-07 DIAGNOSIS — M6281 Muscle weakness (generalized): Secondary | ICD-10-CM | POA: Diagnosis not present

## 2023-06-07 DIAGNOSIS — M810 Age-related osteoporosis without current pathological fracture: Secondary | ICD-10-CM | POA: Diagnosis not present

## 2023-06-07 DIAGNOSIS — F411 Generalized anxiety disorder: Secondary | ICD-10-CM | POA: Diagnosis not present

## 2023-06-10 DIAGNOSIS — H10813 Pingueculitis, bilateral: Secondary | ICD-10-CM | POA: Diagnosis not present

## 2023-06-10 DIAGNOSIS — M6281 Muscle weakness (generalized): Secondary | ICD-10-CM | POA: Diagnosis not present

## 2023-06-10 DIAGNOSIS — M810 Age-related osteoporosis without current pathological fracture: Secondary | ICD-10-CM | POA: Diagnosis not present

## 2023-06-10 DIAGNOSIS — F411 Generalized anxiety disorder: Secondary | ICD-10-CM | POA: Diagnosis not present

## 2023-06-10 DIAGNOSIS — R410 Disorientation, unspecified: Secondary | ICD-10-CM | POA: Diagnosis not present

## 2023-06-10 DIAGNOSIS — R2689 Other abnormalities of gait and mobility: Secondary | ICD-10-CM | POA: Diagnosis not present

## 2023-06-16 ENCOUNTER — Other Ambulatory Visit: Payer: Self-pay | Admitting: *Deleted

## 2023-06-16 NOTE — Patient Outreach (Addendum)
Post- Acute Care Coordinator follow up. Mrs. Emily Horton resides in [place] skilled nursing facility. Screening for potential [facility] care coordination services as benefit of health plan and Primary Care Provider.   Facility site visit to [employer]. Spoke with Emily Horton at bedside. Emily Horton indicates she is still working with therapy. Not sure of transition plans yet. States both her daughter Emily Horton and her son are working on transition plans. Emily Horton gives Clinical research associate to contact daughter Emily Horton if needed.   Secure message sent to Emily Horton to inquire about status of transition plans.   Will continue to follow.   Addendum for 06/16/23: Spoke with Emily Horton, Geophysicist/field seismologist. Emily Horton' family has appealed discharge several times and won. Unsure of transition plan.   Emily Noble, MSN, RN,BSN [facility] Post Acute Care Coordinator [phone] (Direct dial)

## 2023-06-23 ENCOUNTER — Other Ambulatory Visit: Payer: Medicare Other

## 2023-06-27 ENCOUNTER — Ambulatory Visit (INDEPENDENT_AMBULATORY_CARE_PROVIDER_SITE_OTHER): Payer: Medicare Other | Admitting: Orthopedic Surgery

## 2023-06-27 ENCOUNTER — Other Ambulatory Visit (INDEPENDENT_AMBULATORY_CARE_PROVIDER_SITE_OTHER): Payer: Medicare Other

## 2023-06-27 DIAGNOSIS — S329XXA Fracture of unspecified parts of lumbosacral spine and pelvis, initial encounter for closed fracture: Secondary | ICD-10-CM

## 2023-06-27 NOTE — Progress Notes (Signed)
Orthopedic Surgery Progress Note     Assessment: Patient is a 77 y.o. female with right sacral ala and right inferior/superior pubic rami fractures Date of injury: 03/29/2023 (~3 months from injury)     Plan: -No acute operative intervention -No activity restrictions for her pelvis -Weightbearing as tolerated bilaterally -Continue with PT, work on ambulation -Follow-up on an as needed basis   ___________________________________________________________________________   Subjective: She is not having any pain in her hip. She is still working on walking more but has not had pain when weight bearing. No radiating leg pain. Denies paresthesias and numbness.      Physical Exam:   General: no acute distress, appears stated age Neurologic: alert, answering questions appropriately, following commands Respiratory: unlabored breathing on room air, symmetric chest rise Psychiatric: appropriate affect, normal cadence to speech   MSK:    -Right lower extremity             No tenderness to palpation over extremity, no pain with log roll, no gross deformity EHL/TA/GSC intact Plantar flexes and dorsiflexes toes Sensation intact to light touch in sural, saphenous, tibial, deep peroneal, and superficial peroneal nerve distributions Foot warm and well perfused   Imaging: XR of the pelvis from 06/27/2023 was independently reviewed and interpreted, showing nondisplaced inferior and superior ramus fractures.  No interval displacement from her x-rays on 03/29/2023.  Right total hip arthroplasty appears in similar position to prior films. No new fractures seen.    Patient name: Emily Horton Patient MRN: [id number] Date: 06/27/23

## 2023-07-15 ENCOUNTER — Other Ambulatory Visit: Payer: Self-pay | Admitting: *Deleted

## 2023-07-15 NOTE — Patient Outreach (Signed)
Per [facility] Emily Horton discharged from [facility] skilled nursing facility on 07/13/23. Screening for potential care coordination services as benefit of health plan and  Primary Care Provider.  Telephone call made to Emily Horton to discuss care coordination follow up at [phone]. However, daughter [name] [facility]) answered the call. States Mrs. Franklyn transferred to [facility].   Therefore, no identifiable care coordination needs.   Raiford Noble, MSN, RN,BSN [facility] Post Acute Care Coordinator [phone] (Direct dial)

## 2023-07-25 ENCOUNTER — Telehealth: Payer: Self-pay

## 2023-07-25 ENCOUNTER — Other Ambulatory Visit (HOSPITAL_COMMUNITY): Payer: Self-pay

## 2023-07-25 NOTE — Telephone Encounter (Signed)
Pharmacy Patient Advocate Encounter   Received notification from CoverMyMeds that prior authorization for Methocarbamol 500mg  is required/requested.   Insurance verification completed.   The patient is insured through [facility] Part D .   Per test claim:  CHLORZOXAZONE, CYCLOBENZAPRINE HCL, EC-NAPROXEN OR NAPROXEN DR OR NAPROXEN TBEC OR NAPROXEN TABS, IBU OR IBUPROFEN, TIZANIDINE HCL TABS is preferred by the ins.  If suggested medication is appropriate, Please send in a new RX and discontinue this one. If not, please advise as to why it's not appropriate so that we may request a Prior Authorization.

## 2023-07-26 NOTE — Telephone Encounter (Signed)
Clinical questions submitted

## 2023-07-26 NOTE — Telephone Encounter (Signed)
Noted  

## 2023-07-27 NOTE — Telephone Encounter (Signed)
Pharmacy Patient Advocate Encounter  Received notification from [facility] that Prior Authorization for Methocarbamol 500mg  has been DENIED  because it is not on your plan's Drug List (formulary). Medication authorization requires the following: (1) You need to try two (2) of these covered drugs: (a) Chlorzoxazone 500mg . (b) EC-Naproxen, naproxen EC tablet, naproxen tablet, or naproxen DR. (2) OR your doctor needs to give [place] specific medical reasons why two (2) of the covered drug(s) are not appropriate for you.   PA #/Case ID/Reference #: [id number]

## 2023-08-03 ENCOUNTER — Telehealth: Payer: Self-pay | Admitting: Family Medicine

## 2023-08-03 MED ORDER — TIZANIDINE HCL 4 MG PO TABS: 4.0000 mg | ORAL_TABLET | Freq: Four times a day (QID) | ORAL | 5 refills | Status: DC | PRN

## 2023-08-03 NOTE — Telephone Encounter (Signed)
Due to insurance, we stopped Methocarbamol and changed to Tizanidine

## 2023-08-05 ENCOUNTER — Telehealth: Payer: Self-pay | Admitting: Family Medicine

## 2023-08-05 NOTE — Telephone Encounter (Signed)
PA for medication denied, needs Appeal, med change or discontinue methocarbamol (ROBAXIN) 500 MG tablet

## 2023-08-05 NOTE — Telephone Encounter (Signed)
Change this to Flexeril 10 mg to take TID prn muscle spasms, #90 with 5 rf

## 2023-08-12 ENCOUNTER — Other Ambulatory Visit: Payer: Self-pay

## 2023-08-12 MED ORDER — CYCLOBENZAPRINE HCL 10 MG PO TABS: 10.0000 mg | ORAL_TABLET | Freq: Three times a day (TID) | ORAL | 5 refills | Status: DC | PRN

## 2023-08-12 NOTE — Telephone Encounter (Signed)
Pt Rx sent to pt pharmacy, left detailed message on pt voicemail, advised to call the office with any questions

## 2023-08-17 ENCOUNTER — Telehealth: Payer: Self-pay | Admitting: Family Medicine

## 2023-08-17 NOTE — Telephone Encounter (Signed)
Deidre, PT with [facility] [phone] *Ok to leave a detailed message on this line  Verbal Orders - Physical Therapy  Requesting Initial Assessment PT will call back with frequency, once assessment has been completed

## 2023-08-18 NOTE — Telephone Encounter (Signed)
Spoke with Emily Horton with [facility] health advised that Dr Clent Ridges approved verbal orders requested

## 2023-08-18 NOTE — Telephone Encounter (Signed)
Please okay these orders  ?

## 2023-08-22 DIAGNOSIS — M800AXD Age-related osteoporosis with current pathological fracture, other site, subsequent encounter for fracture with routine healing: Secondary | ICD-10-CM | POA: Diagnosis not present

## 2023-08-22 DIAGNOSIS — Z9181 History of falling: Secondary | ICD-10-CM | POA: Diagnosis not present

## 2023-08-22 DIAGNOSIS — C50811 Malignant neoplasm of overlapping sites of right female breast: Secondary | ICD-10-CM | POA: Diagnosis not present

## 2023-08-22 DIAGNOSIS — E785 Hyperlipidemia, unspecified: Secondary | ICD-10-CM | POA: Diagnosis not present

## 2023-08-23 ENCOUNTER — Telehealth: Payer: Self-pay | Admitting: Family Medicine

## 2023-08-23 NOTE — Telephone Encounter (Signed)
Doug with [facility] called with verbal orders for pt. He is requesting a call back: [phone]. He states he needs to leave a frequency of 2 weeks 4 and 1 week 5.

## 2023-08-23 NOTE — Telephone Encounter (Signed)
Please okay these orders  ?

## 2023-08-25 NOTE — Telephone Encounter (Signed)
Spoke with Gala Romney aware that Dr Clent Ridges approved VO requested

## 2023-08-26 DIAGNOSIS — Z9181 History of falling: Secondary | ICD-10-CM | POA: Diagnosis not present

## 2023-08-26 DIAGNOSIS — C50811 Malignant neoplasm of overlapping sites of right female breast: Secondary | ICD-10-CM | POA: Diagnosis not present

## 2023-08-26 DIAGNOSIS — E785 Hyperlipidemia, unspecified: Secondary | ICD-10-CM | POA: Diagnosis not present

## 2023-08-26 DIAGNOSIS — M800AXD Age-related osteoporosis with current pathological fracture, other site, subsequent encounter for fracture with routine healing: Secondary | ICD-10-CM | POA: Diagnosis not present

## 2023-08-29 DIAGNOSIS — E785 Hyperlipidemia, unspecified: Secondary | ICD-10-CM | POA: Diagnosis not present

## 2023-08-29 DIAGNOSIS — C50811 Malignant neoplasm of overlapping sites of right female breast: Secondary | ICD-10-CM | POA: Diagnosis not present

## 2023-08-29 DIAGNOSIS — M800AXD Age-related osteoporosis with current pathological fracture, other site, subsequent encounter for fracture with routine healing: Secondary | ICD-10-CM | POA: Diagnosis not present

## 2023-08-29 DIAGNOSIS — Z9181 History of falling: Secondary | ICD-10-CM | POA: Diagnosis not present

## 2023-08-31 DIAGNOSIS — M800AXD Age-related osteoporosis with current pathological fracture, other site, subsequent encounter for fracture with routine healing: Secondary | ICD-10-CM | POA: Diagnosis not present

## 2023-08-31 DIAGNOSIS — E785 Hyperlipidemia, unspecified: Secondary | ICD-10-CM | POA: Diagnosis not present

## 2023-08-31 DIAGNOSIS — C50811 Malignant neoplasm of overlapping sites of right female breast: Secondary | ICD-10-CM | POA: Diagnosis not present

## 2023-08-31 DIAGNOSIS — Z9181 History of falling: Secondary | ICD-10-CM | POA: Diagnosis not present

## 2023-09-01 MED ORDER — CHLORZOXAZONE 500 MG PO TABS: 500.0000 mg | ORAL_TABLET | Freq: Four times a day (QID) | ORAL | 5 refills | Status: DC | PRN

## 2023-09-01 NOTE — Telephone Encounter (Signed)
Spoke with pt daughter advised of the change in medication. Verbalized understanding

## 2023-09-01 NOTE — Telephone Encounter (Signed)
Per insurance demands, I switched from Tizanidine to Chlorzoxazone.

## 2023-09-05 ENCOUNTER — Inpatient Hospital Stay: Payer: Medicare Other | Admitting: Hematology and Oncology

## 2023-09-06 ENCOUNTER — Telehealth (INDEPENDENT_AMBULATORY_CARE_PROVIDER_SITE_OTHER): Payer: Medicare Other | Admitting: Family Medicine

## 2023-09-06 ENCOUNTER — Encounter: Payer: Self-pay | Admitting: Family Medicine

## 2023-09-06 DIAGNOSIS — J40 Bronchitis, not specified as acute or chronic: Secondary | ICD-10-CM | POA: Diagnosis not present

## 2023-09-06 DIAGNOSIS — S32591A Other specified fracture of right pubis, initial encounter for closed fracture: Secondary | ICD-10-CM | POA: Diagnosis not present

## 2023-09-06 MED ORDER — AMOXICILLIN-POT CLAVULANATE 875-125 MG PO TABS: 1.0000 | ORAL_TABLET | Freq: Two times a day (BID) | ORAL | 0 refills | Status: DC

## 2023-09-06 NOTE — Progress Notes (Signed)
Subjective:    Patient ID: Emily Horton, female    DOB: 07-Apr-1946, 77 y.o.   MRN: [id number]  HPI Virtual Visit via Video Note  I connected with the patient on 09/06/23 at  1:45 PM EDT by a video enabled telemedicine application and verified that I am speaking with the correct person using two identifiers.  Location patient: home Location provider:work or home office Persons participating in the virtual visit: patient, provider  I discussed the limitations of evaluation and management by telemedicine and the availability of in person appointments. The patient expressed understanding and agreed to proceed.   HPI: Here for 2 issues. First she has been coughing up green sputum for the past month. Her chest is congested, but she denies any fever or SOB. We had also requested that her PT be extended after her pelvic fracture, and regulations required her to see [place].    ROS: See pertinent positives and negatives per HPI.  Past Medical History:  Diagnosis Date   Allergy    SEASONAL   Anxiety    Arthritis    right hip, right shoulder   Blood transfusion without reported diagnosis    "when had hip surgery"   Cancer [facility]) 07/2022   right breast papillary carcinoma in situ   Colon polyps 09/25/2007   Colonoscopy   COPD (chronic obstructive pulmonary disease) (HCC)    smoker 1/2ppd   Depression    Diverticulosis of colon (without mention of hemorrhage) 09/25/2007   Colonoscopy   GERD (gastroesophageal reflux disease)    Gynecological examination    sees Dr. Chevis Pretty    Hyperlipidemia    Osteopenia     Past Surgical History:  Procedure Laterality Date   COLONOSCOPY  12/30/2015   per Dr. Marina Goodell, benign  polyps, repeat in 5  yrs.    ESOPHAGOGASTRODUODENOSCOPY  06/30/2012   per Dr. Jarold Motto, reflux but no Barretts seen    HARDWARE REMOVAL Right 02/01/2020   Procedure: HARDWARE REMOVAL;  Surgeon: Kathryne Hitch, MD;  Location: [facility];  Service: Orthopedics;  Laterality:  Right;   INTRAMEDULLARY (IM) NAIL INTERTROCHANTERIC Right 06/22/2019   INTRAMEDULLARY (IM) NAIL INTERTROCHANTERIC Right 06/22/2019   Procedure: INTRAMEDULLARY (IM) NAIL INTERTROCHANTRIC;  Surgeon: Myrene Galas, MD;  Location: [facility] OR;  Service: Orthopedics;  Laterality: Right;   MASTECTOMY W/ SENTINEL NODE BIOPSY Right 09/20/2022   Procedure: RIGHT MASTECTOMY WITH SENTINEL LYMPH NODE BIOPSY;  Surgeon: Griselda Miner, MD;  Location: [facility];  Service: General;  Laterality: Right;   POLYPECTOMY     REVERSE SHOULDER ARTHROPLASTY Right 01/29/2020   Procedure: RIGHT REVERSE SHOULDER ARTHROPLASTY;  Surgeon: Teryl Lucy, MD;  Location: [facility];  Service: Orthopedics;  Laterality: Right;   TOTAL HIP ARTHROPLASTY Right 02/01/2020   Procedure: TOTAL HIP ARTHROPLASTY ANTERIOR APPROACH;  Surgeon: Kathryne Hitch, MD;  Location: [facility];  Service: Orthopedics;  Laterality: Right;    Family History  Problem Relation Age of Onset   Crohn's disease Father    Colon polyps Father    Breast cancer Maternal Grandmother    Stroke Paternal Grandfather    Colon cancer Neg Hx    Esophageal cancer Neg Hx    Rectal cancer Neg Hx    Stomach cancer Neg Hx      Current Outpatient Medications:    alendronate (FOSAMAX) 70 MG tablet, Take 1 tablet (70 mg total) by mouth once a week. Take with a full glass of water on an empty stomach., Disp: 50  tablet, Rfl: 0   anastrozole (ARIMIDEX) 1 MG tablet, Take 1 tablet (1 mg total) by mouth daily., Disp: 90 tablet, Rfl: 3   buPROPion (WELLBUTRIN XL) 300 MG 24 hr tablet, TAKE 1 TABLET(300 MG) BY MOUTH EVERY MORNING (Patient taking differently: Take 300 mg by mouth daily.), Disp: 90 tablet, Rfl: 3   busPIRone (BUSPAR) 15 MG tablet, TAKE 1 TABLET(15 MG) BY MOUTH TWICE DAILY (Patient taking differently: Take 7.5 mg by mouth daily.), Disp: 60 tablet, Rfl: 1   cetirizine (ZYRTEC) 10 MG tablet, TAKE 1 TABLET(10 MG) BY MOUTH DAILY (Patient taking  differently: Take 10 mg by mouth daily as needed for allergies or rhinitis.), Disp: 90 tablet, Rfl: 3   chlorzoxazone (PARAFON) 500 MG tablet, Take 1 tablet (500 mg total) by mouth 4 (four) times daily as needed for muscle spasms., Disp: 60 tablet, Rfl: 5   cholecalciferol (VITAMIN D3) 10 MCG (400 UNIT) TABS tablet, Take 2 tablets (800 Units total) by mouth daily., Disp: 30 tablet, Rfl: 0   clonazePAM (KLONOPIN) 0.5 MG tablet, Take 1 tablet (0.5 mg total) by mouth 3 (three) times daily as needed for anxiety., Disp: 90 tablet, Rfl: 0   EPINEPHrine 0.3 mg/0.3 mL IJ SOAJ injection, Inject 0.3 mLs (0.3 mg total) into the muscle daily as needed for anaphylaxis., Disp: 1 each, Rfl: 0   ibuprofen (ADVIL) 200 MG tablet, Take 200-400 mg by mouth as needed for headache or moderate pain., Disp: , Rfl:    meloxicam (MOBIC) 15 MG tablet, TAKE 1 TABLET(15 MG) BY MOUTH DAILY (Patient taking differently: Take 15 mg by mouth as needed for pain.), Disp: 90 tablet, Rfl: 0   Menthol, Topical Analgesic, (BIOFREEZE) 4 % GEL, Apply 1 Application topically as needed (pain)., Disp: , Rfl:    omeprazole (PRILOSEC) 40 MG capsule, Take 1 capsule (40 mg total) by mouth 2 (two) times daily. (Patient taking differently: Take 40 mg by mouth daily.), Disp: 180 capsule, Rfl: 3   ondansetron (ZOFRAN) 4 MG tablet, Take 1 tablet (4 mg total) by mouth every 8 (eight) hours as needed for nausea or vomiting. (Patient taking differently: Take 4 mg by mouth as needed for nausea or vomiting.), Disp: 10 tablet, Rfl: 0   oxyCODONE (OXY IR/ROXICODONE) 5 MG immediate release tablet, Take 1-2 tablets (5-10 mg total) by mouth every 6 (six) hours as needed for moderate pain or severe pain., Disp: 20 tablet, Rfl: 0   polyethylene glycol (MIRALAX / GLYCOLAX) 17 g packet, Take 17 g by mouth daily as needed for moderate constipation or severe constipation., Disp: 14 each, Rfl: 0   sennosides-docusate sodium (SENOKOT-S) 8.6-50 MG tablet, Take 1-2 tablets by  mouth as needed for constipation., Disp: , Rfl:    triamcinolone cream (KENALOG) 0.1 %, Apply 1 application topically 2 (two) times daily. (Patient taking differently: Apply 1 application  topically as needed (itching).), Disp: 453.6 g, Rfl: 3  EXAM:  VITALS per patient if applicable:  GENERAL: alert, oriented, appears well and in no acute distress  HEENT: atraumatic, conjunttiva clear, no obvious abnormalities on inspection of external nose and ears  NECK: normal movements of the head and neck  LUNGS: on inspection no signs of respiratory distress, breathing rate appears normal, no obvious gross SOB, gasping or wheezing  CV: no obvious cyanosis  MS: moves all visible extremities without noticeable abnormality  PSYCH/NEURO: pleasant and cooperative, no obvious depression or anxiety, speech and thought processing grossly intact  ASSESSMENT AND PLAN: She has bronchitis, and we  will treat with 10 days of Augmentin. We will also continue her PT orders for a few more weeks. Gershon Crane, MD  Discussed the following assessment and plan:  No diagnosis found.     I discussed the assessment and treatment plan with the patient. The patient was provided an opportunity to ask questions and all were answered. The patient agreed with the plan and demonstrated an understanding of the instructions.   The patient was advised to call back or seek an in-person evaluation if the symptoms worsen or if the condition fails to improve as anticipated.      Review of Systems     Objective:   Physical Exam        Assessment & Plan:

## 2023-09-07 DIAGNOSIS — C50811 Malignant neoplasm of overlapping sites of right female breast: Secondary | ICD-10-CM | POA: Diagnosis not present

## 2023-09-07 DIAGNOSIS — M800AXD Age-related osteoporosis with current pathological fracture, other site, subsequent encounter for fracture with routine healing: Secondary | ICD-10-CM | POA: Diagnosis not present

## 2023-09-07 DIAGNOSIS — E785 Hyperlipidemia, unspecified: Secondary | ICD-10-CM | POA: Diagnosis not present

## 2023-09-07 DIAGNOSIS — Z9181 History of falling: Secondary | ICD-10-CM | POA: Diagnosis not present

## 2023-09-08 DIAGNOSIS — C50811 Malignant neoplasm of overlapping sites of right female breast: Secondary | ICD-10-CM | POA: Diagnosis not present

## 2023-09-08 DIAGNOSIS — M800AXD Age-related osteoporosis with current pathological fracture, other site, subsequent encounter for fracture with routine healing: Secondary | ICD-10-CM | POA: Diagnosis not present

## 2023-09-08 DIAGNOSIS — E785 Hyperlipidemia, unspecified: Secondary | ICD-10-CM | POA: Diagnosis not present

## 2023-09-08 DIAGNOSIS — Z9181 History of falling: Secondary | ICD-10-CM | POA: Diagnosis not present

## 2023-09-10 ENCOUNTER — Telehealth: Payer: Self-pay | Admitting: Hematology and Oncology

## 2023-09-12 DIAGNOSIS — E785 Hyperlipidemia, unspecified: Secondary | ICD-10-CM | POA: Diagnosis not present

## 2023-09-12 DIAGNOSIS — Z9181 History of falling: Secondary | ICD-10-CM | POA: Diagnosis not present

## 2023-09-12 DIAGNOSIS — C50811 Malignant neoplasm of overlapping sites of right female breast: Secondary | ICD-10-CM | POA: Diagnosis not present

## 2023-09-12 DIAGNOSIS — M800AXD Age-related osteoporosis with current pathological fracture, other site, subsequent encounter for fracture with routine healing: Secondary | ICD-10-CM | POA: Diagnosis not present

## 2023-09-14 DIAGNOSIS — E785 Hyperlipidemia, unspecified: Secondary | ICD-10-CM | POA: Diagnosis not present

## 2023-09-14 DIAGNOSIS — M800AXD Age-related osteoporosis with current pathological fracture, other site, subsequent encounter for fracture with routine healing: Secondary | ICD-10-CM | POA: Diagnosis not present

## 2023-09-14 DIAGNOSIS — Z9181 History of falling: Secondary | ICD-10-CM | POA: Diagnosis not present

## 2023-09-14 DIAGNOSIS — C50811 Malignant neoplasm of overlapping sites of right female breast: Secondary | ICD-10-CM | POA: Diagnosis not present

## 2023-09-14 MED ORDER — OXYCODONE HCL 5 MG PO TABS: 5.0000 mg | ORAL_TABLET | Freq: Four times a day (QID) | ORAL | 0 refills | Status: AC | PRN

## 2023-09-14 NOTE — Telephone Encounter (Signed)
Done

## 2023-09-16 ENCOUNTER — Inpatient Hospital Stay: Admission: RE | Admit: 2023-09-16 | Payer: Medicare Other | Source: Ambulatory Visit

## 2023-09-21 DIAGNOSIS — C50811 Malignant neoplasm of overlapping sites of right female breast: Secondary | ICD-10-CM | POA: Diagnosis not present

## 2023-09-21 DIAGNOSIS — M800AXD Age-related osteoporosis with current pathological fracture, other site, subsequent encounter for fracture with routine healing: Secondary | ICD-10-CM | POA: Diagnosis not present

## 2023-09-21 DIAGNOSIS — E785 Hyperlipidemia, unspecified: Secondary | ICD-10-CM | POA: Diagnosis not present

## 2023-09-21 DIAGNOSIS — Z9181 History of falling: Secondary | ICD-10-CM | POA: Diagnosis not present

## 2023-09-26 ENCOUNTER — Inpatient Hospital Stay: Payer: Medicare Other | Attending: Hematology and Oncology | Admitting: Hematology and Oncology

## 2023-09-26 DIAGNOSIS — E785 Hyperlipidemia, unspecified: Secondary | ICD-10-CM | POA: Diagnosis not present

## 2023-09-26 DIAGNOSIS — Z9181 History of falling: Secondary | ICD-10-CM | POA: Diagnosis not present

## 2023-09-26 DIAGNOSIS — M800AXD Age-related osteoporosis with current pathological fracture, other site, subsequent encounter for fracture with routine healing: Secondary | ICD-10-CM | POA: Diagnosis not present

## 2023-09-26 DIAGNOSIS — C50811 Malignant neoplasm of overlapping sites of right female breast: Secondary | ICD-10-CM | POA: Diagnosis not present

## 2023-10-03 DIAGNOSIS — M800AXD Age-related osteoporosis with current pathological fracture, other site, subsequent encounter for fracture with routine healing: Secondary | ICD-10-CM | POA: Diagnosis not present

## 2023-10-03 DIAGNOSIS — C50811 Malignant neoplasm of overlapping sites of right female breast: Secondary | ICD-10-CM | POA: Diagnosis not present

## 2023-10-03 DIAGNOSIS — E785 Hyperlipidemia, unspecified: Secondary | ICD-10-CM | POA: Diagnosis not present

## 2023-10-03 DIAGNOSIS — Z9181 History of falling: Secondary | ICD-10-CM | POA: Diagnosis not present

## 2023-10-10 DIAGNOSIS — Z9181 History of falling: Secondary | ICD-10-CM | POA: Diagnosis not present

## 2023-10-10 DIAGNOSIS — M800AXD Age-related osteoporosis with current pathological fracture, other site, subsequent encounter for fracture with routine healing: Secondary | ICD-10-CM | POA: Diagnosis not present

## 2023-10-10 DIAGNOSIS — C50811 Malignant neoplasm of overlapping sites of right female breast: Secondary | ICD-10-CM | POA: Diagnosis not present

## 2023-10-10 DIAGNOSIS — E785 Hyperlipidemia, unspecified: Secondary | ICD-10-CM | POA: Diagnosis not present

## 2023-10-17 DIAGNOSIS — E785 Hyperlipidemia, unspecified: Secondary | ICD-10-CM | POA: Diagnosis not present

## 2023-10-17 DIAGNOSIS — Z9181 History of falling: Secondary | ICD-10-CM | POA: Diagnosis not present

## 2023-10-17 DIAGNOSIS — M800AXD Age-related osteoporosis with current pathological fracture, other site, subsequent encounter for fracture with routine healing: Secondary | ICD-10-CM | POA: Diagnosis not present

## 2023-10-17 DIAGNOSIS — C50811 Malignant neoplasm of overlapping sites of right female breast: Secondary | ICD-10-CM | POA: Diagnosis not present

## 2023-10-19 ENCOUNTER — Telehealth: Payer: Self-pay | Admitting: Family Medicine

## 2023-10-19 NOTE — Telephone Encounter (Signed)
Ok for orders? 

## 2023-10-19 NOTE — Telephone Encounter (Signed)
Emily Horton, PT with [facility] [phone] *Ok to leave a detailed message on this line  Verbal Order - Physical Therapy: 1w2 2w1 1w1 2w2

## 2023-10-20 NOTE — Telephone Encounter (Signed)
Please okay these orders  ?

## 2023-10-21 DIAGNOSIS — Z791 Long term (current) use of non-steroidal anti-inflammatories (NSAID): Secondary | ICD-10-CM | POA: Diagnosis not present

## 2023-10-21 DIAGNOSIS — E785 Hyperlipidemia, unspecified: Secondary | ICD-10-CM | POA: Diagnosis not present

## 2023-10-21 DIAGNOSIS — M800AXD Age-related osteoporosis with current pathological fracture, other site, subsequent encounter for fracture with routine healing: Secondary | ICD-10-CM | POA: Diagnosis not present

## 2023-10-21 DIAGNOSIS — C50811 Malignant neoplasm of overlapping sites of right female breast: Secondary | ICD-10-CM | POA: Diagnosis not present

## 2023-10-21 DIAGNOSIS — Z9181 History of falling: Secondary | ICD-10-CM | POA: Diagnosis not present

## 2023-10-21 NOTE — Telephone Encounter (Signed)
Spoke with Gala Romney PT with [facility], aware that VO orders requested have been approved by Dr Clent Ridges

## 2023-10-24 DIAGNOSIS — Z791 Long term (current) use of non-steroidal anti-inflammatories (NSAID): Secondary | ICD-10-CM | POA: Diagnosis not present

## 2023-10-24 DIAGNOSIS — M800AXD Age-related osteoporosis with current pathological fracture, other site, subsequent encounter for fracture with routine healing: Secondary | ICD-10-CM | POA: Diagnosis not present

## 2023-10-24 DIAGNOSIS — Z9181 History of falling: Secondary | ICD-10-CM | POA: Diagnosis not present

## 2023-10-24 DIAGNOSIS — E785 Hyperlipidemia, unspecified: Secondary | ICD-10-CM | POA: Diagnosis not present

## 2023-10-24 DIAGNOSIS — C50811 Malignant neoplasm of overlapping sites of right female breast: Secondary | ICD-10-CM | POA: Diagnosis not present

## 2023-10-26 DIAGNOSIS — M800AXD Age-related osteoporosis with current pathological fracture, other site, subsequent encounter for fracture with routine healing: Secondary | ICD-10-CM | POA: Diagnosis not present

## 2023-10-26 DIAGNOSIS — Z791 Long term (current) use of non-steroidal anti-inflammatories (NSAID): Secondary | ICD-10-CM | POA: Diagnosis not present

## 2023-10-26 DIAGNOSIS — E785 Hyperlipidemia, unspecified: Secondary | ICD-10-CM | POA: Diagnosis not present

## 2023-10-26 DIAGNOSIS — Z9181 History of falling: Secondary | ICD-10-CM | POA: Diagnosis not present

## 2023-10-26 DIAGNOSIS — C50811 Malignant neoplasm of overlapping sites of right female breast: Secondary | ICD-10-CM | POA: Diagnosis not present

## 2023-10-31 DIAGNOSIS — C50811 Malignant neoplasm of overlapping sites of right female breast: Secondary | ICD-10-CM | POA: Diagnosis not present

## 2023-10-31 DIAGNOSIS — E785 Hyperlipidemia, unspecified: Secondary | ICD-10-CM | POA: Diagnosis not present

## 2023-10-31 DIAGNOSIS — Z9181 History of falling: Secondary | ICD-10-CM | POA: Diagnosis not present

## 2023-10-31 DIAGNOSIS — Z791 Long term (current) use of non-steroidal anti-inflammatories (NSAID): Secondary | ICD-10-CM | POA: Diagnosis not present

## 2023-10-31 DIAGNOSIS — M800AXD Age-related osteoporosis with current pathological fracture, other site, subsequent encounter for fracture with routine healing: Secondary | ICD-10-CM | POA: Diagnosis not present

## 2023-11-08 DIAGNOSIS — E785 Hyperlipidemia, unspecified: Secondary | ICD-10-CM | POA: Diagnosis not present

## 2023-11-08 DIAGNOSIS — C50811 Malignant neoplasm of overlapping sites of right female breast: Secondary | ICD-10-CM | POA: Diagnosis not present

## 2023-11-08 DIAGNOSIS — M800AXD Age-related osteoporosis with current pathological fracture, other site, subsequent encounter for fracture with routine healing: Secondary | ICD-10-CM | POA: Diagnosis not present

## 2023-11-08 DIAGNOSIS — Z791 Long term (current) use of non-steroidal anti-inflammatories (NSAID): Secondary | ICD-10-CM | POA: Diagnosis not present

## 2023-11-08 DIAGNOSIS — Z9181 History of falling: Secondary | ICD-10-CM | POA: Diagnosis not present

## 2023-11-14 DIAGNOSIS — Z9181 History of falling: Secondary | ICD-10-CM | POA: Diagnosis not present

## 2023-11-14 DIAGNOSIS — C50811 Malignant neoplasm of overlapping sites of right female breast: Secondary | ICD-10-CM | POA: Diagnosis not present

## 2023-11-14 DIAGNOSIS — E785 Hyperlipidemia, unspecified: Secondary | ICD-10-CM | POA: Diagnosis not present

## 2023-11-14 DIAGNOSIS — M800AXD Age-related osteoporosis with current pathological fracture, other site, subsequent encounter for fracture with routine healing: Secondary | ICD-10-CM | POA: Diagnosis not present

## 2023-11-14 DIAGNOSIS — Z791 Long term (current) use of non-steroidal anti-inflammatories (NSAID): Secondary | ICD-10-CM | POA: Diagnosis not present

## 2023-11-16 DIAGNOSIS — Z9181 History of falling: Secondary | ICD-10-CM | POA: Diagnosis not present

## 2023-11-16 DIAGNOSIS — Z791 Long term (current) use of non-steroidal anti-inflammatories (NSAID): Secondary | ICD-10-CM | POA: Diagnosis not present

## 2023-11-16 DIAGNOSIS — E785 Hyperlipidemia, unspecified: Secondary | ICD-10-CM | POA: Diagnosis not present

## 2023-11-16 DIAGNOSIS — C50811 Malignant neoplasm of overlapping sites of right female breast: Secondary | ICD-10-CM | POA: Diagnosis not present

## 2023-11-16 DIAGNOSIS — M800AXD Age-related osteoporosis with current pathological fracture, other site, subsequent encounter for fracture with routine healing: Secondary | ICD-10-CM | POA: Diagnosis not present

## 2023-11-20 DIAGNOSIS — Z791 Long term (current) use of non-steroidal anti-inflammatories (NSAID): Secondary | ICD-10-CM | POA: Diagnosis not present

## 2023-11-20 DIAGNOSIS — Z9181 History of falling: Secondary | ICD-10-CM | POA: Diagnosis not present

## 2023-11-20 DIAGNOSIS — C50811 Malignant neoplasm of overlapping sites of right female breast: Secondary | ICD-10-CM | POA: Diagnosis not present

## 2023-11-20 DIAGNOSIS — E785 Hyperlipidemia, unspecified: Secondary | ICD-10-CM | POA: Diagnosis not present

## 2023-11-20 DIAGNOSIS — M800AXD Age-related osteoporosis with current pathological fracture, other site, subsequent encounter for fracture with routine healing: Secondary | ICD-10-CM | POA: Diagnosis not present

## 2023-11-22 DIAGNOSIS — M800AXD Age-related osteoporosis with current pathological fracture, other site, subsequent encounter for fracture with routine healing: Secondary | ICD-10-CM | POA: Diagnosis not present

## 2023-11-22 DIAGNOSIS — C50811 Malignant neoplasm of overlapping sites of right female breast: Secondary | ICD-10-CM | POA: Diagnosis not present

## 2023-11-22 DIAGNOSIS — E785 Hyperlipidemia, unspecified: Secondary | ICD-10-CM | POA: Diagnosis not present

## 2023-11-22 DIAGNOSIS — Z9181 History of falling: Secondary | ICD-10-CM | POA: Diagnosis not present

## 2023-11-22 DIAGNOSIS — Z791 Long term (current) use of non-steroidal anti-inflammatories (NSAID): Secondary | ICD-10-CM | POA: Diagnosis not present

## 2023-11-28 ENCOUNTER — Telehealth: Payer: Self-pay

## 2023-11-28 NOTE — Telephone Encounter (Signed)
FYI VO orders given to PT therapist at [facility]

## 2023-11-28 NOTE — Telephone Encounter (Signed)
Copied from CRM [id number]. Topic: Clinical - Home Health Verbal Orders >> Nov 28, 2023  1:03 PM Louie Boston wrote: Caller/Agency: [facility] Callback Number: [phone] Service Requested: Physical Therapy Frequency: 1x Week for 2 Weeks Any new concerns about the patient? No

## 2023-12-02 DIAGNOSIS — C50811 Malignant neoplasm of overlapping sites of right female breast: Secondary | ICD-10-CM | POA: Diagnosis not present

## 2023-12-02 DIAGNOSIS — M800AXD Age-related osteoporosis with current pathological fracture, other site, subsequent encounter for fracture with routine healing: Secondary | ICD-10-CM | POA: Diagnosis not present

## 2023-12-02 DIAGNOSIS — Z9181 History of falling: Secondary | ICD-10-CM | POA: Diagnosis not present

## 2023-12-02 DIAGNOSIS — Z791 Long term (current) use of non-steroidal anti-inflammatories (NSAID): Secondary | ICD-10-CM | POA: Diagnosis not present

## 2023-12-02 DIAGNOSIS — E785 Hyperlipidemia, unspecified: Secondary | ICD-10-CM | POA: Diagnosis not present

## 2023-12-06 DIAGNOSIS — E785 Hyperlipidemia, unspecified: Secondary | ICD-10-CM | POA: Diagnosis not present

## 2023-12-06 DIAGNOSIS — Z9181 History of falling: Secondary | ICD-10-CM | POA: Diagnosis not present

## 2023-12-06 DIAGNOSIS — Z791 Long term (current) use of non-steroidal anti-inflammatories (NSAID): Secondary | ICD-10-CM | POA: Diagnosis not present

## 2023-12-06 DIAGNOSIS — C50811 Malignant neoplasm of overlapping sites of right female breast: Secondary | ICD-10-CM | POA: Diagnosis not present

## 2023-12-06 DIAGNOSIS — M800AXD Age-related osteoporosis with current pathological fracture, other site, subsequent encounter for fracture with routine healing: Secondary | ICD-10-CM | POA: Diagnosis not present

## 2023-12-15 ENCOUNTER — Encounter: Payer: Self-pay | Admitting: Family Medicine

## 2023-12-15 ENCOUNTER — Ambulatory Visit (INDEPENDENT_AMBULATORY_CARE_PROVIDER_SITE_OTHER): Payer: Medicare Other | Admitting: Family Medicine

## 2023-12-15 DIAGNOSIS — Z0000 Encounter for general adult medical examination without abnormal findings: Secondary | ICD-10-CM | POA: Diagnosis not present

## 2023-12-15 NOTE — Progress Notes (Signed)
 PATIENT CHECK-IN and HEALTH RISK ASSESSMENT QUESTIONNAIRE:  -completed by phone/video for upcoming Medicare Preventive Visit  Pre-Visit Check-in: 1)Vitals (height, wt, BP, etc) - record in vitals section for visit on day of visit Request home vitals (wt, BP, etc.) and enter into vitals, THEN update Vital Signs SmartPhrase below at the top of the HPI. See below.  2)Review and Update Medications, Allergies PMH, Surgeries, Social history in Epic 3)Hospitalizations in the last year with date/reason? No  4)Review and Update Care Team (patient's specialists) in Epic 5) Complete PHQ9 in Epic  6) Complete Fall Screening in Epic 7)Review all Health Maintenance Due and order under PCP if not done.  Medicare Wellness Patient Questionnaire:  Answer theses question about your habits: How often do you have a drink containing alcohol?No How many drinks containing alcohol do you have on a typical day when you are drinking?N/A How often do you have six or more drinks on one occasion?No Have you ever smoked?Yes Quit date if applicable? 5-6 years  How many packs a day do/did you smoke?  Less than 1  Do you use smokeless tobacco? No Do you use an illicit drugs? No On average, how many days per week do you engage in moderate to strenuous exercise (like a brisk walk)? Sometimes goes to the chair exercise classes On average, how many minutes do you engage in exercise at this level?N/A Are you sexually active? No Number of partners?N/A Typical breakfast: Bagel  Typical lunch: Varies  Typical dinner: Varies  Typical snacks: peanut butter  Beverages: Soft drinks  Staying at [facility]  Answer theses question about your everyday activities: Can you perform most household chores?N/A Are you deaf or have significant trouble hearing? No Do you feel that you have a problem with memory? Yes Do you feel safe at home? Yes  Last dentist visit? 1 year ago  8. Do you have any difficulty performing your everyday  activities? No Are you having any difficulty walking, taking medications on your own, and or difficulty managing daily home needs?No Do you have difficulty walking or climbing stairs? Yes Do you have difficulty dressing or bathing? Yes Do you have difficulty doing errands alone such as visiting a doctor's office or shopping? Yes Do you currently have any difficulty preparing food and eating? No Do you currently have any difficulty using the toilet?Occasional Do you have any difficulty managing your finances?No Do you have any difficulties with housekeeping of managing your housekeeping?No   Do you have Advanced Directives in place (Living Will, Healthcare Power or Attorney)?  Yes    Last eye Exam and location? 2 years ago    Do you currently use prescribed or non-prescribed narcotic or opioid pain medications?Yes   Do you have a history or close family history of breast, ovarian, tubal or peritoneal cancer or a family member with BRCA (breast cancer susceptibility 1 and 2) gene mutations? Yes- Breast cancer   Completed by Mykal Good via phone with patient prior to visit.     ----------------------------------------------------------------------------------------------------------------------------------------------------------------------------------------------------------------------  Because this visit was a virtual/telehealth visit, some criteria may be missing or patient reported. Any vitals not documented were not able to be obtained and vitals that have been documented are patient reported.    MEDICARE ANNUAL PREVENTIVE VISIT WITH PROVIDER: (Welcome to [facility], initial annual wellness or annual wellness exam)  Virtual Visit via Phone Note  I connected with Emily Horton on 12/15/23 by phone and verified that I am speaking with the correct person using  two identifiers.  Location patient: home Location provider:work or home office Persons participating in the virtual visit:  patient, provider  Concerns and/or follow up today: reports is doing ok. But she had to take an antibiotic recently and then developed volvo-vaginal pruritus and some burning of the skin when urinate. Thinks this is a yeast infection. Wondering what she can use. No frequency, urgency, fevers, pelvic pain, NV, blood in the urine. No longer able to ambulate - wheelchair bound.    See HM section in Epic for other details of completed HM.    ROS: negative for report of fevers, unintentional weight loss, vision changes, vision loss, hearing loss or change, chest pain, sob, hemoptysis, melena, hematochezia, hematuria, falls, bleeding or bruising, thoughts of suicide or self harm, memory loss  Patient-completed extensive health risk assessment - reviewed and discussed with the patient: See Health Risk Assessment completed with patient prior to the visit either above or in recent phone note. This was reviewed in detailed with the patient today and appropriate recommendations, orders and referrals were placed as needed per Summary below and patient instructions.   Review of Medical History: -PMH, PSH, Family History and current specialty and care providers reviewed and updated and listed below   Patient Care Team: Johnny Garnette LABOR, MD as PCP - General (Family Medicine) Loretha Ash, MD as Consulting Physician (Hematology and Oncology) Glean Stephane BROCKS, RN as Oncology Nurse Navigator Tyree Nanetta SAILOR, RN as Oncology Nurse Navigator Curvin Deward MOULD, MD as Consulting Physician (General Surgery)   Past Medical History:  Diagnosis Date   Allergy    SEASONAL   Anxiety    Arthritis    right hip, right shoulder   Blood transfusion without reported diagnosis    when had hip surgery   Cancer [facility]) 07/2022   right breast papillary carcinoma in situ   Colon polyps 09/25/2007   Colonoscopy   COPD (chronic obstructive pulmonary disease) (HCC)    smoker 1/2ppd   Depression    Diverticulosis of colon  (without mention of hemorrhage) 09/25/2007   Colonoscopy   GERD (gastroesophageal reflux disease)    Gynecological examination    sees Dr. Darina    Hyperlipidemia    Osteopenia     Past Surgical History:  Procedure Laterality Date   COLONOSCOPY  12/30/2015   per Dr. Abran, benign  polyps, repeat in 5  yrs.    ESOPHAGOGASTRODUODENOSCOPY  06/30/2012   per Dr. Jakie, reflux but no Barretts seen    HARDWARE REMOVAL Right 02/01/2020   Procedure: HARDWARE REMOVAL;  Surgeon: Vernetta Lonni GRADE, MD;  Location: [facility];  Service: Orthopedics;  Laterality: Right;   INTRAMEDULLARY (IM) NAIL INTERTROCHANTERIC Right 06/22/2019   INTRAMEDULLARY (IM) NAIL INTERTROCHANTERIC Right 06/22/2019   Procedure: INTRAMEDULLARY (IM) NAIL INTERTROCHANTRIC;  Surgeon: Celena Sharper, MD;  Location: [facility];  Service: Orthopedics;  Laterality: Right;   MASTECTOMY W/ SENTINEL NODE BIOPSY Right 09/20/2022   Procedure: RIGHT MASTECTOMY WITH SENTINEL LYMPH NODE BIOPSY;  Surgeon: Curvin Deward MOULD, MD;  Location: [facility];  Service: General;  Laterality: Right;   POLYPECTOMY     REVERSE SHOULDER ARTHROPLASTY Right 01/29/2020   Procedure: RIGHT REVERSE SHOULDER ARTHROPLASTY;  Surgeon: Josefina Chew, MD;  Location: [facility];  Service: Orthopedics;  Laterality: Right;   TOTAL HIP ARTHROPLASTY Right 02/01/2020   Procedure: TOTAL HIP ARTHROPLASTY ANTERIOR APPROACH;  Surgeon: Vernetta Lonni GRADE, MD;  Location: [facility];  Service: Orthopedics;  Laterality: Right;    Social History  Socioeconomic History   Marital status: Widowed    Spouse name: Not on file   Number of children: 2   Years of education: Not on file   Highest education level: Not on file  Occupational History   Occupation: retired runner, broadcasting/film/video  Tobacco Use   Smoking status: Every Day    Current packs/day: 0.25    Average packs/day: 0.3 packs/day for 30.0 years (7.5 ttl pk-yrs)    Types: Cigarettes    Passive exposure: Past    Smokeless tobacco: Never   Tobacco comments:    Patient currently cutting down on cigaretts  Vaping Use   Vaping status: Every Day  Substance and Sexual Activity   Alcohol use: Yes    Alcohol/week: 0.0 standard drinks of alcohol    Comment: occasional   Drug use: No   Sexual activity: Not Currently    Birth control/protection: Post-menopausal  Other Topics Concern   Not on file  Social History Narrative   Right handed   Social Drivers of Health   Financial Resource Strain: Low Risk  (12/15/2023)   Overall Financial Resource Strain (CARDIA)    Difficulty of Paying Living Expenses: Not very hard  Food Insecurity: No Food Insecurity (12/15/2023)   Hunger Vital Sign    Worried About Running Out of Food in the Last Year: Never true    Ran Out of Food in the Last Year: Never true  Transportation Needs: No Transportation Needs (12/15/2023)   PRAPARE - Administrator, Civil Service (Medical): No    Lack of Transportation (Non-Medical): No  Physical Activity: Inactive (12/15/2023)   Exercise Vital Sign    Days of Exercise per Week: 0 days    Minutes of Exercise per Session: 0 min  Stress: Stress Concern Present (12/15/2023)   [employer] of Occupational Health - Occupational Stress Questionnaire    Feeling of Stress : To some extent  Social Connections: Unknown (12/15/2023)   Social Connection and Isolation Panel [NHANES]    Frequency of Communication with Friends and Family: Once a week    Frequency of Social Gatherings with Friends and Family: Patient unable to answer    Attends Religious Services: Never    Database Administrator or Organizations: Yes    Attends Banker Meetings: 1 to 4 times per year    Marital Status: Widowed  Intimate Partner Violence: Not At Risk (12/15/2023)   Humiliation, Afraid, Rape, and Kick questionnaire    Fear of Current or Ex-Partner: No    Emotionally Abused: No    Physically Abused: No    Sexually Abused: No    Family  History  Problem Relation Age of Onset   Crohn's disease Father    Colon polyps Father    Breast cancer Maternal Grandmother    Stroke Paternal Grandfather    Colon cancer Neg Hx    Esophageal cancer Neg Hx    Rectal cancer Neg Hx    Stomach cancer Neg Hx     Current Outpatient Medications on File Prior to Visit  Medication Sig Dispense Refill   alendronate  (FOSAMAX ) 70 MG tablet Take 1 tablet (70 mg total) by mouth once a week. Take with a full glass of water  on an empty stomach. 50 tablet 0   amoxicillin -clavulanate (AUGMENTIN ) 875-125 MG tablet Take 1 tablet by mouth 2 (two) times daily. 20 tablet 0   anastrozole  (ARIMIDEX ) 1 MG tablet Take 1 tablet (1 mg total) by mouth daily. 90 tablet 3  buPROPion  (WELLBUTRIN  XL) 300 MG 24 hr tablet TAKE 1 TABLET(300 MG) BY MOUTH EVERY MORNING (Patient taking differently: Take 300 mg by mouth daily.) 90 tablet 3   busPIRone  (BUSPAR ) 15 MG tablet TAKE 1 TABLET(15 MG) BY MOUTH TWICE DAILY (Patient taking differently: Take 7.5 mg by mouth daily.) 60 tablet 1   cetirizine  (ZYRTEC ) 10 MG tablet TAKE 1 TABLET(10 MG) BY MOUTH DAILY (Patient taking differently: Take 10 mg by mouth daily as needed for allergies or rhinitis.) 90 tablet 3   chlorzoxazone  (PARAFON ) 500 MG tablet Take 1 tablet (500 mg total) by mouth 4 (four) times daily as needed for muscle spasms. 60 tablet 5   cholecalciferol  (VITAMIN D3) 10 MCG (400 UNIT) TABS tablet Take 2 tablets (800 Units total) by mouth daily. 30 tablet 0   clonazePAM  (KLONOPIN ) 0.5 MG tablet Take 1 tablet (0.5 mg total) by mouth 3 (three) times daily as needed for anxiety. 90 tablet 0   EPINEPHrine  0.3 mg/0.3 mL IJ SOAJ injection Inject 0.3 mLs (0.3 mg total) into the muscle daily as needed for anaphylaxis. 1 each 0   ibuprofen (ADVIL) 200 MG tablet Take 200-400 mg by mouth as needed for headache or moderate pain.     meloxicam  (MOBIC ) 15 MG tablet TAKE 1 TABLET(15 MG) BY MOUTH DAILY (Patient taking differently: Take 15  mg by mouth as needed for pain.) 90 tablet 0   Menthol , Topical Analgesic, (BIOFREEZE) 4 % GEL Apply 1 Application topically as needed (pain).     omeprazole  (PRILOSEC) 40 MG capsule Take 1 capsule (40 mg total) by mouth 2 (two) times daily. (Patient taking differently: Take 40 mg by mouth daily.) 180 capsule 3   ondansetron  (ZOFRAN ) 4 MG tablet Take 1 tablet (4 mg total) by mouth every 8 (eight) hours as needed for nausea or vomiting. (Patient taking differently: Take 4 mg by mouth as needed for nausea or vomiting.) 10 tablet 0   oxyCODONE  (OXY IR/ROXICODONE ) 5 MG immediate release tablet Take 1 tablet (5 mg total) by mouth every 6 (six) hours as needed for moderate pain or severe pain. 120 tablet 0   polyethylene glycol (MIRALAX  / GLYCOLAX ) 17 g packet Take 17 g by mouth daily as needed for moderate constipation or severe constipation. 14 each 0   sennosides-docusate sodium  (SENOKOT-S) 8.6-50 MG tablet Take 1-2 tablets by mouth as needed for constipation.     triamcinolone  cream (KENALOG ) 0.1 % Apply 1 application topically 2 (two) times daily. (Patient taking differently: Apply 1 application  topically as needed (itching).) 453.6 g 3   No current facility-administered medications on file prior to visit.    Allergies  Allergen Reactions   Egg-Derived Products Hives   Zoloft [Sertraline Hcl] Other (See Comments)    Headaches        Physical Exam Vitals requested from patient and listed below if patient had equipment and was able to obtain at home for this virtual visit: There were no vitals filed for this visit. Estimated body mass index is 33.57 kg/m as calculated from the following:   Height as of 03/31/23: 5' 4 (1.626 m).   Weight as of 03/31/23: 195 lb 8.8 oz (88.7 kg).  EKG (optional): deferred due to virtual visit  GENERAL: alert, oriented, no acute distress detected, full vision exam deferred due to pandemic and/or virtual encounter  PSYCH/NEURO: pleasant and cooperative, no  obvious depression or anxiety, speech and thought processing grossly intact, Cognitive function grossly intact  [employer] Visit from  12/15/2023 in [facility]  PHQ-9 Total Score 8           12/15/2023    8:49 AM 05/21/2022    3:19 PM 05/11/2022   12:35 PM 12/11/2021    1:12 PM 05/06/2021   11:23 AM  Depression screen PHQ 2/9  Decreased Interest 0 2 1 3  0  Down, Depressed, Hopeless 2 2 1 2  0  PHQ - 2 Score 2 4 2 5  0  Altered sleeping 0 3 1 3    Tired, decreased energy 1 2 1 2    Change in appetite 1 2 0 3   Feeling bad or failure about yourself  3 0 1 3   Trouble concentrating 0 0 1 2   Moving slowly or fidgety/restless 0 0 0 2   Suicidal thoughts 1 0 1 3   PHQ-9 Score 8 11 7 23    Difficult doing work/chores Somewhat difficult Not difficult at all Not difficult at all         05/11/2022   12:51 PM 05/21/2022    3:19 PM 09/20/2022    9:27 AM 10/02/2022   11:20 AM 12/15/2023    8:52 AM  Fall Risk  Falls in the past year? 1 0   1  Was there an injury with Fall? 0 0   0  Was there an injury with Fall? - Comments No injury or medical attention needed      Fall Risk Category Calculator 2 0   2  Fall Risk Category (Retired) Moderate Low     (RETIRED) Patient Fall Risk Level Moderate fall risk Moderate fall risk High fall risk High fall risk   Patient at Risk for Falls Due to Orthopedic patient No Fall Risks   No Fall Risks  Fall risk Follow up  Falls evaluation completed   Falls evaluation completed     SUMMARY AND PLAN:  Encounter for Medicare annual wellness exam   Discussed applicable health maintenance/preventive health measures and advised and referred or ordered per patient preferences: -she said she is bad about doing recommended care and is not sure if she will get the mammogram or the colon cancer screening. Reports knows due and onc ordered mammo and got letter from GI. Discussed risks of not doing so/benefits at length. She agrees to  call onc/GI if decides to do.  -she refused all vaccines/other measures Health Maintenance  Topic Date Due   Colonoscopy  12/29/2020   MAMMOGRAM  08/18/2023   DTaP/Tdap/Td (2 - Td or Tdap) 12/14/2024 (Originally 09/12/2021)   Pneumonia Vaccine 46+ Years old (3 of 3 - PPSV23 or PCV20) 12/14/2024 (Originally 09/19/2017)   INFLUENZA VACCINE  12/14/2024 (Originally 07/07/2023)   COVID-19 Vaccine (2 - Moderna risk series) 12/14/2024 (Originally 03/28/2020)   Zoster Vaccines- Shingrix (1 of 2) 12/14/2024 (Originally 07/13/1965)   Medicare Annual Wellness (AWV)  12/14/2024   DEXA SCAN  Completed   Hepatitis C Screening  Completed   HPV VACCINES  Aged [employer] and counseling on the following was provided based on the above review of health and a plan/checklist for the patient, along with additional information discussed, was provided for the patient in the patient instructions :  -Provided counseling and plan for increased risk of falling if applicable per above screening. She currently is wheelchair bound and has access to exercise classes at ALF.  -Advised and counseled on a healthy lifestyle - including the importance of a healthy  diet, regular physical activity, social connections and stress management. -Reviewed patient's current diet. Advised and counseled on a whole foods based healthy diet. A summary of a healthy diet was provided in the Patient Instructions.  -reviewed patient's current physical activity level and discussed exercise guidelines for adults. Encouraged her to participate in exercise classes and discussed chair exercises she could do on days classes are not offered.  -Advise yearly dental visits at minimum and regular eye exams -for the skin issues - query vulvovaginitis. Advised inperson eval but she prefers to try empiric tx for yeast infection first with otc clotrimazole after discussion potential etiologies and treatments. Advised if not improving, worsening or other  symptoms that she see follow up with Dr. Johnny, urgent care, or facility physician for inperson evaluation.    Follow up: see patient instructions     Patient Instructions  I really enjoyed getting to talk with you today! I am available on Tuesdays and Thursdays for virtual visits if you have any questions or concerns, or if I can be of any further assistance.    You can try Clotrimazole Cream over the counter for the possible yeast infection. Apply twice daily for 5-7 days. However if symptoms worsen or are not improving please seek medication care with Dr. Johnny or urgent care or the doctor at your facility.   CHECKLIST FROM ANNUAL WELLNESS VISIT:  -Follow up (please call to schedule if not scheduled after visit):   -yearly for annual wellness visit with primary care office  Here is a list of your preventive care/health maintenance measures and the plan for each if any are due:  PLAN For any measures below that may be due:  -Call the Gastroenterology clinic about the colon cancer screening [phone] -call your oncology center to set up your mammogram -noted that you declined all of the vaccines Health Maintenance  Topic Date Due   Colonoscopy  12/29/2020   MAMMOGRAM  08/18/2023   DTaP/Tdap/Td (2 - Td or Tdap) 12/14/2024 (Originally 09/12/2021)   Pneumonia Vaccine 18+ Years old (3 of 3 - PPSV23 or PCV20) 12/14/2024 (Originally 09/19/2017)   INFLUENZA VACCINE  12/14/2024 (Originally 07/07/2023)   COVID-19 Vaccine (2 - Moderna risk series) 12/14/2024 (Originally 03/28/2020)   Zoster Vaccines- Shingrix (1 of 2) 12/14/2024 (Originally 07/13/1965)   Medicare Annual Wellness (AWV)  12/14/2024   DEXA SCAN  Completed   Hepatitis C Screening  Completed   HPV VACCINES  Aged Out    -See a dentist at least yearly  -Get your eyes checked and then per your eye specialist's recommendations  -Other issues addressed today:   -I have included below further information regarding a healthy whole  foods based diet, physical activity guidelines for adults, stress management and opportunities for social connections. I hope you find this information useful.   -----------------------------------------------------------------------------------------------------------------------------------------------------------------------------------------------------------------------------------------------------------  NUTRITION: -eat real food: lots of colorful vegetables (half the plate) and fruits -5-7 servings of vegetables and fruits per day (fresh or steamed is best), exp. 2 servings of vegetables with lunch and dinner and 2 servings of fruit per day. Berries and greens such as kale and collards are great choices.  -consume on a regular basis: whole grains (make sure first ingredient on label contains the word whole), fresh fruits, fish, nuts, seeds, healthy oils (such as olive oil, avocado oil, grape seed oil) -may eat small amounts of dairy and lean meat on occasion, but avoid processed meats such as ham, bacon, lunch meat, etc. -drink water  -try to avoid  fast food and pre-packaged foods, processed meat -most experts advise limiting sodium to < 2300mg  per day, should limit further is any chronic conditions such as high blood pressure, heart disease, diabetes, etc. The American Heart Association advised that < 1500mg  is is ideal -try to avoid foods that contain any ingredients with names you do not recognize  -try to avoid sugar/sweets (except for the natural sugar that occurs in fresh fruit) -try to avoid sweet drinks -try to avoid white rice, white bread, pasta (unless whole grain), white or yellow potatoes  EXERCISE GUIDELINES FOR ADULTS: -if you wish to increase your physical activity, do so gradually and with the approval of your doctor -STOP and seek medical care immediately if you have any chest pain, chest discomfort or trouble breathing when starting or increasing exercise  -move and  stretch your body, legs, feet and arms when sitting for long periods -Physical activity guidelines for optimal health in adults: -least 150 minutes per week of aerobic exercise (can talk, but not sing) once approved by your doctor, 20-30 minutes of sustained activity or two 10 minute episodes of sustained activity every day.  -resistance training at least 2 days per week if approved by your doctor -balance exercises 3+ days per week:   Stand somewhere where you have something sturdy to hold onto if you lose balance.    1) lift up on toes, start with 5x per day and work up to 20x   2) stand and lift on leg straight out to the side so that foot is a few inches of the floor, start with 5x each side and work up to 20x each side   3) stand on one foot, start with 5 seconds each side and work up to 20 seconds on each side  If you need ideas or help with getting more active:   -try to include resistance (weight lifting/strength building) and balance exercises twice per week: or the following link for ideas: http://castillo-powell.com/  buyducts.dk  STRESS MANAGEMENT: -can try meditating, or just sitting quietly with deep breathing while intentionally relaxing all parts of your body for 5 minutes daily -if you need further help with stress, anxiety or depression please follow up with your primary doctor or contact the wonderful folks at [facility]: [phone]    -YouTube has lots of exercise videos for different ages and abilities as well   -Virtual Online Classes (a variety of topics): see seniorplanet.org or call [phone]            Emily JONELLE Cramp, DO

## 2023-12-15 NOTE — Progress Notes (Signed)
 Patient unable to obtain vital signs due to telehealth visit

## 2023-12-15 NOTE — Patient Instructions (Addendum)
 I really enjoyed getting to talk with you today! I am available on Tuesdays and Thursdays for virtual visits if you have any questions or concerns, or if I can be of any further assistance.    You can try Clotrimazole Cream over the counter for the possible yeast infection. Apply twice daily for 5-7 days. However if symptoms worsen or are not improving please seek medication care with Dr. Johnny or urgent care or the doctor at your facility.   CHECKLIST FROM ANNUAL WELLNESS VISIT:  -Follow up (please call to schedule if not scheduled after visit):   -yearly for annual wellness visit with primary care office  Here is a list of your preventive care/health maintenance measures and the plan for each if any are due:  PLAN For any measures below that may be due:  -Call the Gastroenterology clinic about the colon cancer screening [phone] -call your oncology center to set up your mammogram -noted that you declined all of the vaccines Health Maintenance  Topic Date Due   Colonoscopy  12/29/2020   MAMMOGRAM  08/18/2023   DTaP/Tdap/Td (2 - Td or Tdap) 12/14/2024 (Originally 09/12/2021)   Pneumonia Vaccine 106+ Years old (3 of 3 - PPSV23 or PCV20) 12/14/2024 (Originally 09/19/2017)   INFLUENZA VACCINE  12/14/2024 (Originally 07/07/2023)   COVID-19 Vaccine (2 - Moderna risk series) 12/14/2024 (Originally 03/28/2020)   Zoster Vaccines- Shingrix (1 of 2) 12/14/2024 (Originally 07/13/1965)   Medicare Annual Wellness (AWV)  12/14/2024   DEXA SCAN  Completed   Hepatitis C Screening  Completed   HPV VACCINES  Aged Out    -See a dentist at least yearly  -Get your eyes checked and then per your eye specialist's recommendations  -Other issues addressed today:   -I have included below further information regarding a healthy whole foods based diet, physical activity guidelines for adults, stress management and opportunities for social connections. I hope you find this information useful.    -----------------------------------------------------------------------------------------------------------------------------------------------------------------------------------------------------------------------------------------------------------  NUTRITION: -eat real food: lots of colorful vegetables (half the plate) and fruits -5-7 servings of vegetables and fruits per day (fresh or steamed is best), exp. 2 servings of vegetables with lunch and dinner and 2 servings of fruit per day. Berries and greens such as kale and collards are great choices.  -consume on a regular basis: whole grains (make sure first ingredient on label contains the word whole), fresh fruits, fish, nuts, seeds, healthy oils (such as olive oil, avocado oil, grape seed oil) -may eat small amounts of dairy and lean meat on occasion, but avoid processed meats such as ham, bacon, lunch meat, etc. -drink water  -try to avoid fast food and pre-packaged foods, processed meat -most experts advise limiting sodium to < 2300mg  per day, should limit further is any chronic conditions such as high blood pressure, heart disease, diabetes, etc. The American Heart Association advised that < 1500mg  is is ideal -try to avoid foods that contain any ingredients with names you do not recognize  -try to avoid sugar/sweets (except for the natural sugar that occurs in fresh fruit) -try to avoid sweet drinks -try to avoid white rice, white bread, pasta (unless whole grain), white or yellow potatoes  EXERCISE GUIDELINES FOR ADULTS: -if you wish to increase your physical activity, do so gradually and with the approval of your doctor -STOP and seek medical care immediately if you have any chest pain, chest discomfort or trouble breathing when starting or increasing exercise  -move and stretch your body, legs, feet and arms when  sitting for long periods -Physical activity guidelines for optimal health in adults: -least 150 minutes per week of  aerobic exercise (can talk, but not sing) once approved by your doctor, 20-30 minutes of sustained activity or two 10 minute episodes of sustained activity every day.  -resistance training at least 2 days per week if approved by your doctor -balance exercises 3+ days per week:   Stand somewhere where you have something sturdy to hold onto if you lose balance.    1) lift up on toes, start with 5x per day and work up to 20x   2) stand and lift on leg straight out to the side so that foot is a few inches of the floor, start with 5x each side and work up to 20x each side   3) stand on one foot, start with 5 seconds each side and work up to 20 seconds on each side  If you need ideas or help with getting more active:   -try to include resistance (weight lifting/strength building) and balance exercises twice per week: or the following link for ideas: http://castillo-powell.com/  buyducts.dk  STRESS MANAGEMENT: -can try meditating, or just sitting quietly with deep breathing while intentionally relaxing all parts of your body for 5 minutes daily -if you need further help with stress, anxiety or depression please follow up with your primary doctor or contact the wonderful folks at [facility]: [phone]    -YouTube has lots of exercise videos for different ages and abilities as well   -Virtual Online Classes (a variety of topics): see seniorplanet.org or call [phone]

## 2023-12-16 ENCOUNTER — Telehealth: Payer: Self-pay

## 2023-12-16 NOTE — Telephone Encounter (Signed)
 Copied from CRM [id number]. Topic: Clinical - Home Health Verbal Orders >> Isadore 10, 2025  8:29 AM Franky GRADE wrote: Caller/Agency: [facility]  Callback Number: [phone] Fax:[phone] Service Requested: Physical Therapy Frequency: 1 time a week for 2 weeks, verbal orders were received; however, pending signed orders that were faxed to the office by [facility] home health care.  Any new concerns about the patient? No

## 2023-12-16 NOTE — Telephone Encounter (Signed)
 Spoke with Emily Horton agency, advised that pt form has been faxed

## 2023-12-16 NOTE — Telephone Encounter (Signed)
 Ok for VO

## 2023-12-16 NOTE — Telephone Encounter (Signed)
 Please okay these orders  ?

## 2024-02-13 ENCOUNTER — Ambulatory Visit: Admitting: Family Medicine

## 2024-02-14 ENCOUNTER — Ambulatory Visit (INDEPENDENT_AMBULATORY_CARE_PROVIDER_SITE_OTHER): Admitting: Family Medicine

## 2024-02-14 ENCOUNTER — Encounter: Payer: Self-pay | Admitting: Family Medicine

## 2024-02-14 VITALS — BP 130/80 | HR 94 | Resp 16 | Ht 64.0 in

## 2024-02-14 DIAGNOSIS — R600 Localized edema: Secondary | ICD-10-CM | POA: Diagnosis not present

## 2024-02-14 DIAGNOSIS — K5900 Constipation, unspecified: Secondary | ICD-10-CM | POA: Diagnosis not present

## 2024-02-14 DIAGNOSIS — R300 Dysuria: Secondary | ICD-10-CM | POA: Diagnosis not present

## 2024-02-14 DIAGNOSIS — R413 Other amnesia: Secondary | ICD-10-CM | POA: Diagnosis not present

## 2024-02-14 DIAGNOSIS — R310 Gross hematuria: Secondary | ICD-10-CM

## 2024-02-14 LAB — COMPREHENSIVE METABOLIC PANEL
ALT: 8 U/L (ref 0–35)
AST: 10 U/L (ref 0–37)
Albumin: 4.3 g/dL (ref 3.5–5.2)
Alkaline Phosphatase: 68 U/L (ref 39–117)
BUN: 15 mg/dL (ref 6–23)
CO2: 27 meq/L (ref 19–32)
Calcium: 9.4 mg/dL (ref 8.4–10.5)
Chloride: 104 meq/L (ref 96–112)
Creatinine, Ser: 0.8 mg/dL (ref 0.40–1.20)
GFR: 71.99 mL/min (ref >60.00)
Glucose, Bld: 103 mg/dL — ABNORMAL HIGH (ref 70–99)
Potassium: 3.6 meq/L (ref 3.5–5.1)
Sodium: 139 meq/L (ref 135–145)
Total Bilirubin: 0.8 mg/dL (ref 0.2–1.2)
Total Protein: 6.9 g/dL (ref 6.0–8.3)

## 2024-02-14 LAB — CBC WITH DIFFERENTIAL/PLATELET
Basophils Absolute: 0 10*3/uL (ref 0.0–0.1)
Basophils Relative: 0.5 % (ref 0.0–3.0)
Eosinophils Absolute: 0.2 10*3/uL (ref 0.0–0.7)
Eosinophils Relative: 2.1 % (ref 0.0–5.0)
HCT: 40.1 % (ref 36.0–46.0)
Hemoglobin: 13.5 g/dL (ref 12.0–15.0)
Lymphocytes Relative: 15.3 % (ref 12.0–46.0)
Lymphs Abs: 1.4 10*3/uL (ref 0.7–4.0)
MCHC: 33.7 g/dL (ref 30.0–36.0)
MCV: 90.9 fl (ref 78.0–100.0)
Monocytes Absolute: 0.8 10*3/uL (ref 0.1–1.0)
Monocytes Relative: 9.1 % (ref 3.0–12.0)
Neutro Abs: 6.8 10*3/uL (ref 1.4–7.7)
Neutrophils Relative %: 73 % (ref 43.0–77.0)
Platelets: 255 10*3/uL (ref 150.0–400.0)
RBC: 4.41 Mil/uL (ref 3.87–5.11)
RDW: 13.5 % (ref 11.5–15.5)
WBC: 9.4 10*3/uL (ref 4.0–10.5)

## 2024-02-14 LAB — TSH: TSH: 2.15 u[IU]/mL (ref 0.35–5.50)

## 2024-02-14 LAB — VITAMIN B12: Vitamin B-12: 189 pg/mL — ABNORMAL LOW (ref 211–911)

## 2024-02-14 MED ORDER — SULFAMETHOXAZOLE-TRIMETHOPRIM 800-160 MG PO TABS: 1.0000 | ORAL_TABLET | Freq: Two times a day (BID) | ORAL | 0 refills | Status: AC

## 2024-02-14 NOTE — Progress Notes (Signed)
 ACUTE VISIT Chief Complaint  Patient presents with   Urinary Tract Infection   HPI: [name] is a 78 y.o. female with a PMHx significant for IBS, GERD, OA, right breast cancer, anemia, and depression, among some, who is here today with her daughter complaining of UTI symptoms.   She complains of increased urinary frequency, dysuria/burning, and cramps on both sides of her lower abdomen. She has had these symptoms for about a month.  Her daughter also mentions that on 02/11/24 she told her she has seen blood in urine about 2 weeks ago, pt tells me she has had it on and off for a month.  She feels the urge to urinate but has not been able to collect sample. She does not remember last time she urinated today.  She also endorses some chills at night and decreased appetite for a few weeks..  She says she has not had a bowel movement for 2 weeks, but is not sure. She also has occasional nausea. She is able to pass gas.  Pertinent negatives include fever, CP, chest pain, SOB, palpitations, or vomiting.  She has not tried OTC medications.  Negative for vaginal bleeding or discharge but she tells me that for months she has had perineal pruritus, which she attributes to wearing diapers.  -Her daughter also mentions she has been having memory issues that have worsened significantly over the last 2 weeks. Daughter reports frequent confusion and says she fell out of her wheelchair last week.   -She also c/o LE edema, which she has "always" had. Her daughter states that she got her compression stocking but she is not wearing them. Also she is not ambulatory. Component     Latest Ref Rng 03/29/2023  Sodium     135 - 145 mEq/L 138   Potassium     3.5 - 5.1 mEq/L 3.8   Chloride     96 - 112 mEq/L 106   CO2     19 - 32 mEq/L 24   Glucose     70 - 99 mg/dL 161 (H)   BUN     6 - 23 mg/dL 25 (H)   Creatinine     0.40 - 1.20 mg/dL 0.96 (H)   Calcium     8.4 - 10.5 mg/dL 9.6   Total  Bilirubin     0.2 - 1.2 mg/dL 1.1   Alkaline Phosphatase     39 - 117 U/L 83   AST     0 - 37 U/L 12 (L)   ALT     0 - 35 U/L 10   Total Protein     6.0 - 8.3 g/dL 7.0   Albumin     3.5 - 5.2 g/dL 4.1   Anion gap     5 - 15  8   GFR, Estimated     >60 mL/min 57 (L)     She last saw Dr. Clent Ridges on video on 09/06/2023. Last saw him in the office in 05/2022.  Review of Systems  Constitutional:  Positive for appetite change and fatigue. Negative for unexpected weight change.  Respiratory:  Negative for cough and wheezing.   Gastrointestinal:  Negative for abdominal distention and blood in stool.  Genitourinary:  Positive for urgency. Negative for flank pain and genital sores.  Skin:  Negative for rash.  Neurological:  Negative for syncope, facial asymmetry and headaches.  Psychiatric/Behavioral:  Negative for hallucinations.   See other pertinent positives and  negatives in HPI.  Current Outpatient Medications on File Prior to Visit  Medication Sig Dispense Refill   alendronate (FOSAMAX) 70 MG tablet Take 1 tablet (70 mg total) by mouth once a week. Take with a full glass of water on an empty stomach. 50 tablet 0   anastrozole (ARIMIDEX) 1 MG tablet Take 1 tablet (1 mg total) by mouth daily. 90 tablet 3   buPROPion (WELLBUTRIN XL) 300 MG 24 hr tablet TAKE 1 TABLET(300 MG) BY MOUTH EVERY MORNING (Patient taking differently: Take 300 mg by mouth daily.) 90 tablet 3   busPIRone (BUSPAR) 15 MG tablet TAKE 1 TABLET(15 MG) BY MOUTH TWICE DAILY (Patient taking differently: Take 7.5 mg by mouth daily.) 60 tablet 1   cetirizine (ZYRTEC) 10 MG tablet TAKE 1 TABLET(10 MG) BY MOUTH DAILY (Patient taking differently: Take 10 mg by mouth daily as needed for allergies or rhinitis.) 90 tablet 3   chlorzoxazone (PARAFON) 500 MG tablet Take 1 tablet (500 mg total) by mouth 4 (four) times daily as needed for muscle spasms. 60 tablet 5   cholecalciferol (VITAMIN D3) 10 MCG (400 UNIT) TABS tablet Take 2 tablets  (800 Units total) by mouth daily. 30 tablet 0   clonazePAM (KLONOPIN) 0.5 MG tablet Take 1 tablet (0.5 mg total) by mouth 3 (three) times daily as needed for anxiety. 90 tablet 0   EPINEPHrine 0.3 mg/0.3 mL IJ SOAJ injection Inject 0.3 mLs (0.3 mg total) into the muscle daily as needed for anaphylaxis. 1 each 0   ibuprofen (ADVIL) 200 MG tablet Take 200-400 mg by mouth as needed for headache or moderate pain.     meloxicam (MOBIC) 15 MG tablet TAKE 1 TABLET(15 MG) BY MOUTH DAILY (Patient taking differently: Take 15 mg by mouth as needed for pain.) 90 tablet 0   Menthol, Topical Analgesic, (BIOFREEZE) 4 % GEL Apply 1 Application topically as needed (pain).     omeprazole (PRILOSEC) 40 MG capsule Take 1 capsule (40 mg total) by mouth 2 (two) times daily. (Patient taking differently: Take 40 mg by mouth daily.) 180 capsule 3   ondansetron (ZOFRAN) 4 MG tablet Take 1 tablet (4 mg total) by mouth every 8 (eight) hours as needed for nausea or vomiting. (Patient taking differently: Take 4 mg by mouth as needed for nausea or vomiting.) 10 tablet 0   oxyCODONE (OXY IR/ROXICODONE) 5 MG immediate release tablet Take 1 tablet (5 mg total) by mouth every 6 (six) hours as needed for moderate pain or severe pain. 120 tablet 0   polyethylene glycol (MIRALAX / GLYCOLAX) 17 g packet Take 17 g by mouth daily as needed for moderate constipation or severe constipation. 14 each 0   sennosides-docusate sodium (SENOKOT-S) 8.6-50 MG tablet Take 1-2 tablets by mouth as needed for constipation.     triamcinolone cream (KENALOG) 0.1 % Apply 1 application topically 2 (two) times daily. (Patient taking differently: Apply 1 application  topically as needed (itching).) 453.6 g 3   No current facility-administered medications on file prior to visit.    Past Medical History:  Diagnosis Date   Allergy    SEASONAL   Anxiety    Arthritis    right hip, right shoulder   Blood transfusion without reported diagnosis    "when had hip  surgery"   Cancer [facility]) 07/2022   right breast papillary carcinoma in situ   Colon polyps 09/25/2007   Colonoscopy   COPD (chronic obstructive pulmonary disease) (HCC)    smoker 1/2ppd  Depression    Diverticulosis of colon (without mention of hemorrhage) 09/25/2007   Colonoscopy   GERD (gastroesophageal reflux disease)    Gynecological examination    sees Dr. Chevis Pretty    Hyperlipidemia    Osteopenia    Allergies  Allergen Reactions   Egg-Derived Products Hives   Zoloft [Sertraline Hcl] Other (See Comments)    Headaches     Social History   Socioeconomic History   Marital status: Widowed    Spouse name: Not on file   Number of children: 2   Years of education: Not on file   Highest education level: Not on file  Occupational History   Occupation: retired Runner, broadcasting/film/video  Tobacco Use   Smoking status: Every Day    Current packs/day: 0.25    Average packs/day: 0.3 packs/day for 30.0 years (7.5 ttl pk-yrs)    Types: Cigarettes    Passive exposure: Past   Smokeless tobacco: Never   Tobacco comments:    Patient currently cutting down on cigaretts  Vaping Use   Vaping status: Every Day  Substance and Sexual Activity   Alcohol use: Yes    Alcohol/week: 0.0 standard drinks of alcohol    Comment: occasional   Drug use: No   Sexual activity: Not Currently    Birth control/protection: Post-menopausal  Other Topics Concern   Not on file  Social History Narrative   Right handed   Social Drivers of Health   Financial Resource Strain: Low Risk  (12/15/2023)   Overall Financial Resource Strain (CARDIA)    Difficulty of Paying Living Expenses: Not very hard  Food Insecurity: No Food Insecurity (12/15/2023)   Hunger Vital Sign    Worried About Running Out of Food in the Last Year: Never true    Ran Out of Food in the Last Year: Never true  Transportation Needs: No Transportation Needs (12/15/2023)   PRAPARE - Administrator, Civil Service (Medical): No    Lack of  Transportation (Non-Medical): No  Physical Activity: Inactive (12/15/2023)   Exercise Vital Sign    Days of Exercise per Week: 0 days    Minutes of Exercise per Session: 0 min  Stress: Stress Concern Present (12/15/2023)   [employer] of Occupational Health - Occupational Stress Questionnaire    Feeling of Stress : To some extent  Social Connections: Unknown (12/15/2023)   Social Connection and Isolation Panel [NHANES]    Frequency of Communication with Friends and Family: Once a week    Frequency of Social Gatherings with Friends and Family: Patient unable to answer    Attends Religious Services: Never    Database administrator or Organizations: Yes    Attends Banker Meetings: 1 to 4 times per year    Marital Status: Widowed   Today's Vitals   02/14/24 1306  BP: 130/80  Pulse: 94  Resp: 16  SpO2: 97%  Height: 5\' 4"  (1.626 m)   Body mass index is 33.57 kg/m.  Physical Exam Vitals and nursing note reviewed.  Constitutional:      General: She is not in acute distress.    Appearance: She is well-developed.  HENT:     Head: Normocephalic and atraumatic.     Mouth/Throat:     Mouth: Mucous membranes are moist.  Eyes:     Conjunctiva/sclera: Conjunctivae normal.  Cardiovascular:     Rate and Rhythm: Normal rate and regular rhythm.     Heart sounds: No murmur heard.  Comments: DP pulses palpable. Pulmonary:     Effort: Pulmonary effort is normal. No respiratory distress.     Breath sounds: Normal breath sounds.  Abdominal:     Palpations: Abdomen is soft. There is no mass.     Tenderness: There is no abdominal tenderness.  Musculoskeletal:     Right lower leg: 2+ Pitting Edema present.     Left lower leg: 2+ Pitting Edema present.  Skin:    General: Skin is warm.     Findings: No erythema or rash.  Neurological:     Mental Status: She is alert.     Comments: When asked the date, she says we are in March of 2014.  Oriented in place and person. In a  wheel chair.  Psychiatric:        Mood and Affect: Mood and affect normal.    ASSESSMENT AND PLAN:  Ms. Cyr was seen today for UTI symptoms.   Lab Results  Component Value Date   NA 139 02/14/2024   CL 104 02/14/2024   K 3.6 02/14/2024   CO2 27 02/14/2024   BUN 15 02/14/2024   CREATININE 0.80 02/14/2024   GFR 71.99 02/14/2024   CALCIUM 9.4 02/14/2024   ALBUMIN 4.3 02/14/2024   GLUCOSE 103 (H) 02/14/2024   Lab Results  Component Value Date   ALT 8 02/14/2024   AST 10 02/14/2024   ALKPHOS 68 02/14/2024   BILITOT 0.8 02/14/2024   Lab Results  Component Value Date   WBC 9.4 02/14/2024   HGB 13.5 02/14/2024   HCT 40.1 02/14/2024   MCV 90.9 02/14/2024   PLT 255.0 02/14/2024   Lab Results  Component Value Date   TSH 2.15 02/14/2024   Lab Results  Component Value Date   VITAMINB12 189 (L) 02/14/2024   Constipation, unspecified constipation type States that she has not had a bowel movement for 2 weeks, abdominal examination does not suggest acute abdomen. Hx of IBS. Colonoscopy in 2017, her daughter states that she has not been able to have another one because cannot do the prep. Adequate hydration and fiber intake. Miralax daily as needed recommended, can add Bisacodyl 5 mg daily as needed if still having problem. Instructed about warning signs.  -     Comprehensive metabolic panel; Future -     TSH; Future  Dysuria She was not able to collect urine sample. Instructed to go to the ER if she has not been able to urinate in 24 hours, she may need a urine cath. Empiric treatment with Bactrim DS recommended bid x 5 d.  -     Urinalysis, Routine w reflex microscopic; Future -     Urine Culture; Future  Memory difficulties According to daughter this has been going on for some time but worse for the past 2 weeks. Today she is not oriented in time. An acute infectious process as well as some of her chronic medical conditions can be contributing factors. Further  recommendations according to lab results.  -     Comprehensive metabolic panel; Future -     CBC with Differential/Platelet; Future -     TSH; Future -     Vitamin B12; Future  Gross hematuria It is not clear for how long, she tells me that for a month but her daughter just learned about this on 02/11/24. We discussed possible causes. She has not been able to collect urine today. Water given, hopefully she can collect sample before leaving, otherwise she  can try to bring it tomorrow.  Bilateral lower extremity edema Chronic. LE elevation above heart level a few times during the day. Compression stockings.  Other orders -     Sulfamethoxazole-Trimethoprim; Take 1 tablet by mouth 2 (two) times daily for 5 days.  Dispense: 10 tablet; Refill: 0   -Reporting genital pruritus for a while, attributed to diaper. Monistat OTC and Desitin recommended.  I spent a total of 41 minutes in both face to face and non face to face activities for this visit on the date of this encounter. During this time history was obtained and documented, examination was performed, prior labs reviewed, and assessment/plan discussed.  Urinary symptoms for a month, worsening memory for a couple weeks. Hemodynamically stable, I do not think she needs to go to the ER at this time. Further recommendations will be given according to lab results. Clearly instructed about warning signs.  Return in about 2 weeks (around 02/28/2024) for memmory difficulties and f/u with PCP.  I, Rolla Etienne Wierda, acting as a scribe for Lashan Macias [place], MD., have documented all relevant documentation on the behalf of Johany Hansman [place], MD, as directed by  Garren Greenman [place], MD while in the presence of Jessey Stehlin [place], MD.   I, Gennaro Lizotte [place], MD, have reviewed all documentation for this visit. The documentation on 02/14/24 for the exam, diagnosis, procedures, and orders are all accurate and complete.  Eilene Voigt G. [place], MD  [facility]. [place]  office.

## 2024-02-14 NOTE — Patient Instructions (Addendum)
 A few things to remember from today's visit:  Dysuria - Plan: Urinalysis, Routine w reflex microscopic, Culture, Urine  Constipation, unspecified constipation type - Plan: Comprehensive metabolic panel, TSH  Memory difficulties - Plan: Comprehensive metabolic panel, CBC with Differential/Platelet, TSH, Vitamin B12  Gross hematuria  Bilateral lower extremity edema  Miralax daily as needed. Add Bisacodyl 5 mg daily as needed if Miralax doe snot help.  Lower extremities elevation above heart level for swelling.  If fever, severe abdominal pain, fever, or vomiting you need to seek immediate medical attention.  If you need refills for medications you take chronically, please call your pharmacy. Do not use My Chart to request refills or for acute issues that need immediate attention. If you send a my chart message, it may take a few days to be addressed, specially if I am not in the office.  Please be sure medication list is accurate. If a new problem present, please set up appointment sooner than planned today.

## 2024-03-12 ENCOUNTER — Ambulatory Visit (INDEPENDENT_AMBULATORY_CARE_PROVIDER_SITE_OTHER): Admitting: Family Medicine

## 2024-03-12 ENCOUNTER — Encounter: Payer: Self-pay | Admitting: Family Medicine

## 2024-03-12 VITALS — BP 130/82 | HR 83 | Temp 98.1°F | Wt 195.0 lb

## 2024-03-12 DIAGNOSIS — F339 Major depressive disorder, recurrent, unspecified: Secondary | ICD-10-CM | POA: Diagnosis not present

## 2024-03-12 DIAGNOSIS — F411 Generalized anxiety disorder: Secondary | ICD-10-CM

## 2024-03-12 DIAGNOSIS — H6123 Impacted cerumen, bilateral: Secondary | ICD-10-CM | POA: Diagnosis not present

## 2024-03-12 DIAGNOSIS — R300 Dysuria: Secondary | ICD-10-CM | POA: Diagnosis not present

## 2024-03-12 DIAGNOSIS — N3281 Overactive bladder: Secondary | ICD-10-CM | POA: Insufficient documentation

## 2024-03-12 DIAGNOSIS — R600 Localized edema: Secondary | ICD-10-CM | POA: Diagnosis not present

## 2024-03-12 LAB — URINALYSIS, ROUTINE W REFLEX MICROSCOPIC
Bilirubin Urine: NEGATIVE
Hgb urine dipstick: NEGATIVE
Ketones, ur: NEGATIVE
Nitrite: NEGATIVE
Specific Gravity, Urine: 1.01 (ref 1.000–1.030)
Total Protein, Urine: NEGATIVE
Urine Glucose: NEGATIVE
Urobilinogen, UA: 0.2 (ref 0.0–1.0)
pH: 6.5 (ref 5.0–8.0)

## 2024-03-12 LAB — POC URINALSYSI DIPSTICK (AUTOMATED)
Bilirubin, UA: NEGATIVE
Blood, UA: NEGATIVE
Glucose, UA: NEGATIVE
Ketones, UA: NEGATIVE
Leukocytes, UA: NEGATIVE
Nitrite, UA: NEGATIVE
Protein, UA: NEGATIVE
Spec Grav, UA: 1.015 (ref 1.010–1.025)
Urobilinogen, UA: 0.2 U/dL
pH, UA: 6.5 (ref 5.0–8.0)

## 2024-03-12 MED ORDER — OXYBUTYNIN CHLORIDE ER 10 MG PO TB24: 10.0000 mg | ORAL_TABLET | Freq: Every day | ORAL | 3 refills | Status: AC

## 2024-03-12 MED ORDER — FUROSEMIDE 20 MG PO TABS: 20.0000 mg | ORAL_TABLET | Freq: Every morning | ORAL | 3 refills | Status: AC

## 2024-03-12 NOTE — Addendum Note (Signed)
 Addended by: Carola Rhine on: 03/12/2024 01:23 PM   Modules accepted: Orders

## 2024-03-12 NOTE — Progress Notes (Signed)
 Subjective:    Patient ID: Emily Horton, female    DOB: 08-24-1946, 78 y.o.   MRN: [id number]  HPI Here with her daughter for several issues. She saw Dr. [place] on 02-14-24 complaining of urinary urgency and burning. She could not provide a urine sample, so she was empirically treated with 5 days of Bactrim DS. Today she says her urine symptoms have not changed at all. She also describes urgency to urinate with frequent incontinence. She says she will call the nursing staff to help her to the bathroom, but she wets herself before they can get to her room. She wears protective underpants. She also describes feeling sad frequently, and her daughter says she cries a lot. Her daughter also says Guiselle appears to be quite sedated in the afternoons. According to the medication list her daughter shows me, she is only getting 1/2 tablet of Buspar (7.5 mg) daily with the Wellbutriin, and this is given to her in the mornings. She also has Methocarbamol on her list, and she is not sure if she is taking this or not. She again complains about the swelling in her ankles. No SOB. She refuses to wear compression stockings. Finally she asks [place] to check her ears because she is having trouble hearing out of them.    Review of Systems  Constitutional: Negative.   Respiratory: Negative.    Cardiovascular:  Positive for leg swelling. Negative for chest pain and palpitations.  Gastrointestinal: Negative.   Genitourinary:  Positive for dysuria, frequency and urgency. Negative for flank pain and hematuria.  Psychiatric/Behavioral:  Positive for confusion and dysphoric mood. Negative for agitation, behavioral problems, hallucinations, self-injury and suicidal ideas. The patient is nervous/anxious.        Objective:   Physical Exam Constitutional:      Appearance: Normal appearance.     Comments: In a wheelchair   HENT:     Right Ear: There is impacted cerumen.     Left Ear: There is impacted cerumen.  Cardiovascular:      Rate and Rhythm: Normal rate and regular rhythm.     Pulses: Normal pulses.     Heart sounds: Normal heart sounds.  Pulmonary:     Effort: Pulmonary effort is normal.     Breath sounds: Normal breath sounds.  Abdominal:     Tenderness: There is no right CVA tenderness or left CVA tenderness.  Musculoskeletal:     Comments: 2+ edema in both ankles and lower legs   Neurological:     Mental Status: She is alert and oriented to person, place, and time.  Psychiatric:        Behavior: Behavior normal.        Thought Content: Thought content normal.     Comments: Affect is somewhat depressed           Assessment & Plan:  For her depression and anxiety, we will make sure the nurses give her a whole (15 mg) tablet of Buspar daily, and to give this at bedtime. We will stop the Methocarbamol since it can be sedating. She does not seem to have a UTI, but we will culture her sample to be certain. She does have OAB, so she will try Oxybutynin XL 10 mg at bedtime. For the leg swelling, she will try Lasix 20 mg every morning. She also has cerumen impactions in both ears, so we irrigated both ears clear with water after obtaining informed consent. She tolerated this well, and afterwards she  could hear much better. Gershon Crane, MD

## 2024-03-13 LAB — URINE CULTURE
MICRO NUMBER:: 16296953
Result:: NO GROWTH
SPECIMEN QUALITY:: ADEQUATE

## 2024-03-14 ENCOUNTER — Encounter: Payer: Self-pay | Admitting: *Deleted

## 2024-03-17 DIAGNOSIS — F339 Major depressive disorder, recurrent, unspecified: Secondary | ICD-10-CM | POA: Diagnosis not present

## 2024-03-17 DIAGNOSIS — R413 Other amnesia: Secondary | ICD-10-CM | POA: Diagnosis not present

## 2024-03-17 DIAGNOSIS — E785 Hyperlipidemia, unspecified: Secondary | ICD-10-CM | POA: Diagnosis not present

## 2024-03-17 DIAGNOSIS — M810 Age-related osteoporosis without current pathological fracture: Secondary | ICD-10-CM | POA: Diagnosis not present

## 2024-03-17 DIAGNOSIS — H6123 Impacted cerumen, bilateral: Secondary | ICD-10-CM | POA: Diagnosis not present

## 2024-03-17 DIAGNOSIS — N3281 Overactive bladder: Secondary | ICD-10-CM | POA: Diagnosis not present

## 2024-03-17 DIAGNOSIS — F411 Generalized anxiety disorder: Secondary | ICD-10-CM | POA: Diagnosis not present

## 2024-03-17 DIAGNOSIS — R600 Localized edema: Secondary | ICD-10-CM | POA: Diagnosis not present

## 2024-03-17 DIAGNOSIS — R300 Dysuria: Secondary | ICD-10-CM | POA: Diagnosis not present

## 2024-03-17 DIAGNOSIS — R3915 Urgency of urination: Secondary | ICD-10-CM | POA: Diagnosis not present

## 2024-03-17 DIAGNOSIS — I10 Essential (primary) hypertension: Secondary | ICD-10-CM | POA: Diagnosis not present

## 2024-03-17 DIAGNOSIS — Z9181 History of falling: Secondary | ICD-10-CM | POA: Diagnosis not present

## 2024-03-20 ENCOUNTER — Telehealth: Payer: Self-pay | Admitting: *Deleted

## 2024-03-20 NOTE — Telephone Encounter (Signed)
 Ok for VO

## 2024-03-20 NOTE — Telephone Encounter (Signed)
 Copied from CRM [id number]. Topic: Clinical - Home Health Verbal Orders >> Mar 20, 2024 11:37 AM Freya Jesus wrote: Caller/Agency: Larinda Plover [facility] Callback Number: [phone] Service Requested: Physical Therapy Frequency: 1 time a week for 8 weeks. Any new concerns about the patient? No

## 2024-03-21 DIAGNOSIS — R3915 Urgency of urination: Secondary | ICD-10-CM | POA: Diagnosis not present

## 2024-03-21 DIAGNOSIS — F339 Major depressive disorder, recurrent, unspecified: Secondary | ICD-10-CM | POA: Diagnosis not present

## 2024-03-21 DIAGNOSIS — I10 Essential (primary) hypertension: Secondary | ICD-10-CM | POA: Diagnosis not present

## 2024-03-21 DIAGNOSIS — N3281 Overactive bladder: Secondary | ICD-10-CM | POA: Diagnosis not present

## 2024-03-21 DIAGNOSIS — R600 Localized edema: Secondary | ICD-10-CM | POA: Diagnosis not present

## 2024-03-21 DIAGNOSIS — F411 Generalized anxiety disorder: Secondary | ICD-10-CM | POA: Diagnosis not present

## 2024-03-21 NOTE — Telephone Encounter (Signed)
 Chris for pt for patient  is needing a call back regarding orders for patient he would like a call back

## 2024-03-22 ENCOUNTER — Telehealth: Payer: Self-pay | Admitting: Family Medicine

## 2024-03-22 NOTE — Telephone Encounter (Signed)
 Copied from CRM [id number]. Topic: Clinical - Home Health Verbal Orders >> Mar 22, 2024  1:11 PM Elita Guitar wrote: Caller/Agency: Yevonne Heman [facility] Callback Number: [phone] Service Requested: Speech Therapy Frequency: 1week 7 Any new concerns about the patient? No

## 2024-03-26 ENCOUNTER — Telehealth: Payer: Self-pay

## 2024-03-26 NOTE — Telephone Encounter (Signed)
 Copied from CRM [id number]. Topic: Clinical - Home Health Verbal Orders >> Mar 20, 2024 11:37 AM Freya Jesus wrote: Caller/Agency: Larinda Plover [facility] Callback Number: [phone] Service Requested: Physical Therapy Frequency: 1 time a week for 8 weeks. Any new concerns about the patient? No

## 2024-03-26 NOTE — Telephone Encounter (Signed)
 Copied from CRM [id number]. Topic: Clinical - Home Health Verbal Orders >> Mar 26, 2024 11:00 AM Bambi Bonine D wrote: Caller/Agency: [facility], [place] Number: [phone] Service Requested: Physical Therapy Frequency: once a week for 6 weeks Any new concerns about the patient? No

## 2024-03-26 NOTE — Telephone Encounter (Signed)
 Copied from CRM [id number]. Topic: Clinical - Home Health Verbal Orders >> Mar 22, 2024  1:11 PM Elita Guitar wrote: Caller/Agency: Yevonne Heman [facility] Callback Number: [phone] Service Requested: Speech Therapy Frequency: 1week 7 Any new concerns about the patient? No >> Mar 26, 2024  3:47 PM Alysia Jumbo S wrote: Venida Gills with [facility] calling to check the status of home health order for patient. CALLBACK # [phone].

## 2024-03-27 DIAGNOSIS — I10 Essential (primary) hypertension: Secondary | ICD-10-CM | POA: Diagnosis not present

## 2024-03-27 DIAGNOSIS — F339 Major depressive disorder, recurrent, unspecified: Secondary | ICD-10-CM | POA: Diagnosis not present

## 2024-03-27 DIAGNOSIS — N3281 Overactive bladder: Secondary | ICD-10-CM | POA: Diagnosis not present

## 2024-03-27 DIAGNOSIS — F411 Generalized anxiety disorder: Secondary | ICD-10-CM | POA: Diagnosis not present

## 2024-03-27 DIAGNOSIS — R600 Localized edema: Secondary | ICD-10-CM | POA: Diagnosis not present

## 2024-03-27 DIAGNOSIS — R3915 Urgency of urination: Secondary | ICD-10-CM | POA: Diagnosis not present

## 2024-03-27 NOTE — Telephone Encounter (Signed)
 Please okay these orders  ?

## 2024-03-27 NOTE — Telephone Encounter (Signed)
 Please advise if ok for VO

## 2024-03-27 NOTE — Telephone Encounter (Signed)
 Spoke with Buddie Carina advised that Dr Alyne Babinski has approved VO requested

## 2024-03-27 NOTE — Telephone Encounter (Signed)
Message has been sent to PCP for approval.

## 2024-03-27 NOTE — Telephone Encounter (Signed)
Ok to give VO? 

## 2024-03-27 NOTE — Telephone Encounter (Signed)
 Duplicate encounter

## 2024-03-28 DIAGNOSIS — R600 Localized edema: Secondary | ICD-10-CM | POA: Diagnosis not present

## 2024-03-28 DIAGNOSIS — F411 Generalized anxiety disorder: Secondary | ICD-10-CM | POA: Diagnosis not present

## 2024-03-28 DIAGNOSIS — F339 Major depressive disorder, recurrent, unspecified: Secondary | ICD-10-CM | POA: Diagnosis not present

## 2024-03-28 DIAGNOSIS — I10 Essential (primary) hypertension: Secondary | ICD-10-CM | POA: Diagnosis not present

## 2024-03-28 DIAGNOSIS — R3915 Urgency of urination: Secondary | ICD-10-CM | POA: Diagnosis not present

## 2024-03-28 DIAGNOSIS — N3281 Overactive bladder: Secondary | ICD-10-CM | POA: Diagnosis not present

## 2024-03-29 DIAGNOSIS — N3281 Overactive bladder: Secondary | ICD-10-CM | POA: Diagnosis not present

## 2024-03-29 DIAGNOSIS — I10 Essential (primary) hypertension: Secondary | ICD-10-CM | POA: Diagnosis not present

## 2024-03-29 DIAGNOSIS — F411 Generalized anxiety disorder: Secondary | ICD-10-CM | POA: Diagnosis not present

## 2024-03-29 DIAGNOSIS — R600 Localized edema: Secondary | ICD-10-CM | POA: Diagnosis not present

## 2024-03-29 DIAGNOSIS — R3915 Urgency of urination: Secondary | ICD-10-CM | POA: Diagnosis not present

## 2024-03-29 DIAGNOSIS — F339 Major depressive disorder, recurrent, unspecified: Secondary | ICD-10-CM | POA: Diagnosis not present

## 2024-04-02 ENCOUNTER — Telehealth: Payer: Self-pay

## 2024-04-02 DIAGNOSIS — R3915 Urgency of urination: Secondary | ICD-10-CM | POA: Diagnosis not present

## 2024-04-02 DIAGNOSIS — F339 Major depressive disorder, recurrent, unspecified: Secondary | ICD-10-CM | POA: Diagnosis not present

## 2024-04-02 DIAGNOSIS — N3281 Overactive bladder: Secondary | ICD-10-CM | POA: Diagnosis not present

## 2024-04-02 DIAGNOSIS — F411 Generalized anxiety disorder: Secondary | ICD-10-CM | POA: Diagnosis not present

## 2024-04-02 DIAGNOSIS — I10 Essential (primary) hypertension: Secondary | ICD-10-CM | POA: Diagnosis not present

## 2024-04-02 DIAGNOSIS — R600 Localized edema: Secondary | ICD-10-CM | POA: Diagnosis not present

## 2024-04-02 NOTE — Telephone Encounter (Signed)
 Pt Office visit notes faxed as requested and confirmation received

## 2024-04-02 NOTE — Telephone Encounter (Signed)
 Copied from CRM [id number]. Topic: General - Other >> Apr 02, 2024 10:38 AM Dorthula Gavel H wrote: Reason for CRM: Buddie Carina from [facility] is wanting the signed visit note from 4/7 faxed over to her. Fax # is [phone]

## 2024-04-04 DIAGNOSIS — N3281 Overactive bladder: Secondary | ICD-10-CM | POA: Diagnosis not present

## 2024-04-04 DIAGNOSIS — F411 Generalized anxiety disorder: Secondary | ICD-10-CM | POA: Diagnosis not present

## 2024-04-04 DIAGNOSIS — R3915 Urgency of urination: Secondary | ICD-10-CM | POA: Diagnosis not present

## 2024-04-04 DIAGNOSIS — I10 Essential (primary) hypertension: Secondary | ICD-10-CM | POA: Diagnosis not present

## 2024-04-04 DIAGNOSIS — F339 Major depressive disorder, recurrent, unspecified: Secondary | ICD-10-CM | POA: Diagnosis not present

## 2024-04-04 DIAGNOSIS — R600 Localized edema: Secondary | ICD-10-CM | POA: Diagnosis not present

## 2024-04-05 DIAGNOSIS — R600 Localized edema: Secondary | ICD-10-CM | POA: Diagnosis not present

## 2024-04-05 DIAGNOSIS — F339 Major depressive disorder, recurrent, unspecified: Secondary | ICD-10-CM | POA: Diagnosis not present

## 2024-04-05 DIAGNOSIS — N3281 Overactive bladder: Secondary | ICD-10-CM | POA: Diagnosis not present

## 2024-04-05 DIAGNOSIS — I10 Essential (primary) hypertension: Secondary | ICD-10-CM | POA: Diagnosis not present

## 2024-04-05 DIAGNOSIS — F411 Generalized anxiety disorder: Secondary | ICD-10-CM | POA: Diagnosis not present

## 2024-04-05 DIAGNOSIS — R3915 Urgency of urination: Secondary | ICD-10-CM | POA: Diagnosis not present

## 2024-04-09 ENCOUNTER — Telehealth: Payer: Self-pay

## 2024-04-09 DIAGNOSIS — R3915 Urgency of urination: Secondary | ICD-10-CM | POA: Diagnosis not present

## 2024-04-09 DIAGNOSIS — R600 Localized edema: Secondary | ICD-10-CM | POA: Diagnosis not present

## 2024-04-09 DIAGNOSIS — F339 Major depressive disorder, recurrent, unspecified: Secondary | ICD-10-CM | POA: Diagnosis not present

## 2024-04-09 DIAGNOSIS — N3281 Overactive bladder: Secondary | ICD-10-CM | POA: Diagnosis not present

## 2024-04-09 DIAGNOSIS — F411 Generalized anxiety disorder: Secondary | ICD-10-CM | POA: Diagnosis not present

## 2024-04-09 DIAGNOSIS — I10 Essential (primary) hypertension: Secondary | ICD-10-CM | POA: Diagnosis not present

## 2024-04-09 NOTE — Telephone Encounter (Signed)
 Copied from CRM [id number]. Topic: General - Other >> Apr 09, 2024  3:10 PM Aisha D wrote: Reason for CRM: Rex with [facility] is calling in regards to paperwork that was faxed on 5/2. Rex stated that it is for occupational therapy and physical therapy. Rex is needed the documents signed and faxed back as soon as possible, and stated he will also re fax the documents needed.

## 2024-04-09 NOTE — Telephone Encounter (Signed)
 Pt forms have been signed and faxed back to [facility]

## 2024-04-11 DIAGNOSIS — F339 Major depressive disorder, recurrent, unspecified: Secondary | ICD-10-CM | POA: Diagnosis not present

## 2024-04-11 DIAGNOSIS — N3281 Overactive bladder: Secondary | ICD-10-CM | POA: Diagnosis not present

## 2024-04-11 DIAGNOSIS — R3915 Urgency of urination: Secondary | ICD-10-CM | POA: Diagnosis not present

## 2024-04-11 DIAGNOSIS — F411 Generalized anxiety disorder: Secondary | ICD-10-CM | POA: Diagnosis not present

## 2024-04-11 DIAGNOSIS — R600 Localized edema: Secondary | ICD-10-CM | POA: Diagnosis not present

## 2024-04-11 DIAGNOSIS — I10 Essential (primary) hypertension: Secondary | ICD-10-CM | POA: Diagnosis not present

## 2024-04-12 DIAGNOSIS — R3915 Urgency of urination: Secondary | ICD-10-CM | POA: Diagnosis not present

## 2024-04-12 DIAGNOSIS — F339 Major depressive disorder, recurrent, unspecified: Secondary | ICD-10-CM | POA: Diagnosis not present

## 2024-04-12 DIAGNOSIS — R600 Localized edema: Secondary | ICD-10-CM | POA: Diagnosis not present

## 2024-04-12 DIAGNOSIS — N3281 Overactive bladder: Secondary | ICD-10-CM | POA: Diagnosis not present

## 2024-04-12 DIAGNOSIS — I10 Essential (primary) hypertension: Secondary | ICD-10-CM | POA: Diagnosis not present

## 2024-04-12 DIAGNOSIS — F411 Generalized anxiety disorder: Secondary | ICD-10-CM | POA: Diagnosis not present

## 2024-04-16 DIAGNOSIS — E785 Hyperlipidemia, unspecified: Secondary | ICD-10-CM | POA: Diagnosis not present

## 2024-04-16 DIAGNOSIS — N3281 Overactive bladder: Secondary | ICD-10-CM | POA: Diagnosis not present

## 2024-04-16 DIAGNOSIS — M810 Age-related osteoporosis without current pathological fracture: Secondary | ICD-10-CM | POA: Diagnosis not present

## 2024-04-16 DIAGNOSIS — H6123 Impacted cerumen, bilateral: Secondary | ICD-10-CM | POA: Diagnosis not present

## 2024-04-16 DIAGNOSIS — I10 Essential (primary) hypertension: Secondary | ICD-10-CM | POA: Diagnosis not present

## 2024-04-16 DIAGNOSIS — R600 Localized edema: Secondary | ICD-10-CM | POA: Diagnosis not present

## 2024-04-16 DIAGNOSIS — F411 Generalized anxiety disorder: Secondary | ICD-10-CM | POA: Diagnosis not present

## 2024-04-16 DIAGNOSIS — F339 Major depressive disorder, recurrent, unspecified: Secondary | ICD-10-CM | POA: Diagnosis not present

## 2024-04-16 DIAGNOSIS — R300 Dysuria: Secondary | ICD-10-CM | POA: Diagnosis not present

## 2024-04-16 DIAGNOSIS — R3915 Urgency of urination: Secondary | ICD-10-CM | POA: Diagnosis not present

## 2024-04-16 DIAGNOSIS — R413 Other amnesia: Secondary | ICD-10-CM | POA: Diagnosis not present

## 2024-04-16 DIAGNOSIS — Z9181 History of falling: Secondary | ICD-10-CM | POA: Diagnosis not present

## 2024-04-18 DIAGNOSIS — R3915 Urgency of urination: Secondary | ICD-10-CM | POA: Diagnosis not present

## 2024-04-18 DIAGNOSIS — N3281 Overactive bladder: Secondary | ICD-10-CM | POA: Diagnosis not present

## 2024-04-18 DIAGNOSIS — R600 Localized edema: Secondary | ICD-10-CM | POA: Diagnosis not present

## 2024-04-18 DIAGNOSIS — F339 Major depressive disorder, recurrent, unspecified: Secondary | ICD-10-CM | POA: Diagnosis not present

## 2024-04-18 DIAGNOSIS — I10 Essential (primary) hypertension: Secondary | ICD-10-CM | POA: Diagnosis not present

## 2024-04-18 DIAGNOSIS — F411 Generalized anxiety disorder: Secondary | ICD-10-CM | POA: Diagnosis not present

## 2024-04-26 ENCOUNTER — Telehealth: Payer: Self-pay | Admitting: *Deleted

## 2024-04-26 ENCOUNTER — Encounter: Payer: Self-pay | Admitting: Family Medicine

## 2024-04-26 NOTE — Telephone Encounter (Signed)
 Copied from CRM [id number]. Topic: Clinical - Medication Question >> Apr 26, 2024 11:41 AM Dorthula Gavel H wrote: Reason for CRM: Nurse Emily Horton from [facility] at [facility] states pt has pink eye and they are requesting a eye drop antibiotic to be sent in without her having to come into the office due to her being wheelchair bound making transport harder. Call back for [facility] [phone]

## 2024-04-27 ENCOUNTER — Other Ambulatory Visit: Payer: Self-pay

## 2024-04-27 MED ORDER — TOBRAMYCIN-DEXAMETHASONE 0.3-0.1 % OP OINT: TOPICAL_OINTMENT | OPHTHALMIC | 2 refills | Status: DC

## 2024-04-27 MED ORDER — TOBRAMYCIN-DEXAMETHASONE 0.3-0.1 % OP SUSP: 2.0000 [drp] | OPHTHALMIC | 1 refills | Status: AC

## 2024-04-27 NOTE — Telephone Encounter (Signed)
 Rx sent to pt pharmacy per Dr Alyne Babinski approval

## 2024-04-27 NOTE — Telephone Encounter (Signed)
 Left detailed message for Melissa advised that Rx for eye drops was sent to pt pharmacy

## 2024-04-27 NOTE — Telephone Encounter (Signed)
 Okay. Call in Tobradex ophthalmic drops, to apply 2 drops to affected eyes every 6 hours as needed, 5 ml with 2 rf

## 2024-04-27 NOTE — Telephone Encounter (Signed)
 Spoke with pt daughter who is on pt DPR, advised that pt new Rx for eyedrops was sent to [facility], state that [place] will not accept Rx from outside pharmacy, Spoke to the nurse at [facility] advised that Rx was faxed and confirmation received

## 2024-04-27 NOTE — Telephone Encounter (Signed)
 Done

## 2024-05-03 DIAGNOSIS — R3915 Urgency of urination: Secondary | ICD-10-CM | POA: Diagnosis not present

## 2024-05-03 DIAGNOSIS — F339 Major depressive disorder, recurrent, unspecified: Secondary | ICD-10-CM | POA: Diagnosis not present

## 2024-05-03 DIAGNOSIS — N3281 Overactive bladder: Secondary | ICD-10-CM | POA: Diagnosis not present

## 2024-05-03 DIAGNOSIS — F411 Generalized anxiety disorder: Secondary | ICD-10-CM | POA: Diagnosis not present

## 2024-05-03 DIAGNOSIS — R600 Localized edema: Secondary | ICD-10-CM | POA: Diagnosis not present

## 2024-05-03 DIAGNOSIS — I10 Essential (primary) hypertension: Secondary | ICD-10-CM | POA: Diagnosis not present

## 2024-05-10 ENCOUNTER — Telehealth: Payer: Self-pay | Admitting: *Deleted

## 2024-05-10 NOTE — Telephone Encounter (Signed)
 Copied from CRM [id number]. Topic: General - Other >> May 10, 2024  2:38 PM Dorisann Garre T wrote: Reason for CRM: Emily H[facility] from [facility] is calling in requesting home health orders to be signed and faxed back over

## 2024-05-11 NOTE — Telephone Encounter (Signed)
 Pt orders have been placed on Dr Alyne Babinski red folder for signing, will fax soon as completed

## 2024-05-14 NOTE — Telephone Encounter (Signed)
 Pt H H forms were faxed to [facility] on Friday 05/11/24

## 2024-05-16 ENCOUNTER — Telehealth: Payer: Self-pay

## 2024-05-16 NOTE — Telephone Encounter (Signed)
 Copied from CRM [id number]. Topic: General - Other >> May 16, 2024  3:59 PM Juleen Oakland F wrote: Reason for CRM: Ardelia Beau from [facility] called to follow up on home health orders being faxed, I let her know that home health orders were faxed on Friday 05/11/24. She asked if it could be please fax again at [phone]. Her call back number is  [phone]

## 2024-05-16 NOTE — Telephone Encounter (Signed)
 Returned Ruch call advised to call the office back regarding this encounter

## 2024-05-16 NOTE — Telephone Encounter (Signed)
 Copied from CRM [id number]. Topic: Clinical - Home Health Verbal Orders >> May 16, 2024  4:19 PM Baldomero Bone wrote: Caller/Agency: Ardelia Beau with [facility] Callback Number: [phone] Ardelia Beau with [facility] wants to know if Emily Horton will be signing her home health orders.

## 2024-05-16 NOTE — Telephone Encounter (Signed)
 Left detailed message for Emily Horton with [facility] regarding Pt HH notes advised to call the office back

## 2024-05-17 NOTE — Telephone Encounter (Signed)
 Pt orders faxed, left a message for Ardelia Beau to call the office back with any questions

## 2024-06-05 ENCOUNTER — Telehealth: Payer: Self-pay

## 2024-06-05 NOTE — Telephone Encounter (Signed)
 Copied from CRM [id number]. Topic: Clinical - Home Health Verbal Orders >> Jun 05, 2024  9:26 AM Silvana PARAS wrote: Caller/Agency: Geni Cella [facility] Callback Number: [phone]   Geni calling due to Betty [place] provider needing Physician's order and will be sent via fax at [phone].

## 2024-06-07 NOTE — Telephone Encounter (Signed)
 Home Health notes faxed as requested

## 2024-10-12 ENCOUNTER — Telehealth: Payer: Self-pay | Admitting: *Deleted

## 2024-10-12 NOTE — Telephone Encounter (Signed)
 Copied from CRM #[id number]. Topic: General - Other >> Oct 12, 2024 11:31 AM Revonda D wrote: Reason for CRM: Detrada with [facility] stated that they receive a home health referral for the pt and needs her last office visit notes. Fax [phone].

## 2024-10-15 ENCOUNTER — Telehealth: Payer: Self-pay | Admitting: *Deleted

## 2024-10-15 NOTE — Telephone Encounter (Signed)
 Please okay these orders  ?

## 2024-10-15 NOTE — Telephone Encounter (Signed)
 Copied from CRM [id number]. Topic: Clinical - Home Health Verbal Orders >> Oct 15, 2024  1:35 PM Alexandria E wrote: Caller/Agency: Maranda, PT with [facility] Number: [phone] (secure line) Service Requested: Physical Therapy Frequency: 1x/week for 2 weeks, 2x/week for 3 weeks, then 1x/week for 2 weeks Any new concerns about the patient? No

## 2024-10-16 ENCOUNTER — Telehealth: Payer: Self-pay

## 2024-10-16 NOTE — Telephone Encounter (Signed)
 Verbal orders given to Maranda, PT with [facility] for PT.

## 2024-10-16 NOTE — Telephone Encounter (Signed)
 Copied from CRM [id number]. Topic: Clinical - Home Health Verbal Orders >> Oct 16, 2024  3:33 PM Rea BROCKS wrote: Caller/Agency: [facility] Callback Number: [phone] Service Requested: Occupational Therapy  Frequency: 1x week for 8 starting today 11/11 Any new concerns about the patient?

## 2024-10-17 NOTE — Telephone Encounter (Signed)
 Left a message for Harlene with [facility], advised that VO orders for OT have been approved

## 2024-10-17 NOTE — Telephone Encounter (Signed)
 Please okay these orders  ?

## 2024-10-17 NOTE — Telephone Encounter (Signed)
 Pt LOV noted have been faxed to [facility] successfully

## 2024-10-24 ENCOUNTER — Telehealth: Payer: Self-pay | Admitting: Family Medicine

## 2024-10-24 NOTE — Telephone Encounter (Signed)
 LVM for pt to return call to the office--pt has Medicare A/B so she can only have an in-office/face-to-face appt.  Medicare won't cover a telephone or video visit.  Copied from CRM #[id number]. Topic: General - Other >> Oct 24, 2024  8:28 AM Emily Horton wrote: Reason for CRM: Patient needs a face time call to approve physical and occupation therapy with [facility] please call [phone] to further discuss with patient

## 2024-10-30 ENCOUNTER — Ambulatory Visit: Admitting: Family Medicine

## 2024-10-30 NOTE — Telephone Encounter (Signed)
 Left detailed message for Harlene with [facility], advised of the approval for pt VO as requested.

## 2024-10-31 ENCOUNTER — Telehealth: Payer: Self-pay | Admitting: Family Medicine

## 2024-10-31 NOTE — Telephone Encounter (Unsigned)
 Copied from CRM #[id number]. Topic: General - Other >> Oct 31, 2024 10:44 AM Franky GRADE wrote: Reason for CRM: Silvano from [facility] is calling to verify if patient completed the virtual appointment she had scheduled yesterday and if a copy of the notes could be faxed to her. Fax [phone].

## 2024-10-31 NOTE — Telephone Encounter (Signed)
 Copied from CRM #[id number]. Topic: General - Other >> Oct 31, 2024 12:55 PM Deaijah H wrote: Reason for CRM: Patient would like to let Dr. Johnny know she appreciates all his doing for getting her PT and setting it up.

## 2024-11-06 ENCOUNTER — Telehealth: Admitting: Family Medicine

## 2024-11-06 ENCOUNTER — Telehealth: Payer: Self-pay | Admitting: *Deleted

## 2024-11-06 ENCOUNTER — Encounter: Payer: Self-pay | Admitting: Family Medicine

## 2024-11-06 DIAGNOSIS — R296 Repeated falls: Secondary | ICD-10-CM

## 2024-11-06 DIAGNOSIS — M150 Primary generalized (osteo)arthritis: Secondary | ICD-10-CM | POA: Diagnosis not present

## 2024-11-06 NOTE — Telephone Encounter (Signed)
 Pt Virtual visit was completed by Dr Johnny successfully

## 2024-11-06 NOTE — Telephone Encounter (Signed)
 Pt virtual visit noted have been successfully faxed to [facility] with [facility]

## 2024-11-06 NOTE — Progress Notes (Signed)
 Subjective:    Patient ID: Emily Horton, female    DOB: [birth year], 78 y.o.   MRN: [id number]  HPI Virtual Visit via Video Note  I connected with the patient on 11/06/24 at  2:15 PM EST by a video enabled telemedicine application and verified that I am speaking with the correct person using two identifiers.  Location patient: home Location provider:work or home office Persons participating in the virtual visit: patient, provider  I discussed the limitations of evaluation and management by telemedicine and the availability of in person appointments. The patient expressed understanding and agreed to proceed.   HPI: Here requesting PT and OT. 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 She also has balanced issues and has fallen several times when she tries to walk. So far no significant injuries from the falls. She is currently living at [facility].    ROS: See pertinent positives and negatives per HPI.  Past Medical History:  Diagnosis Date   Allergy    SEASONAL   Anxiety    Arthritis    right hip, right shoulder   Blood transfusion without reported diagnosis    when had hip surgery   Cancer (HCC) 07/2022   right breast papillary carcinoma in situ   Colon polyps 09/25/2007   Colonoscopy   COPD (chronic obstructive pulmonary disease) (HCC)    smoker 1/2ppd   Depression    Diverticulosis of colon (without mention of hemorrhage) 09/25/2007   Colonoscopy   GERD (gastroesophageal reflux disease)    Gynecological examination    sees Dr. Darina    Hyperlipidemia    Osteopenia     Past Surgical History:  Procedure Laterality Date   COLONOSCOPY  12/30/2015   per Dr. Abran, benign  polyps, repeat in 5  yrs.    ESOPHAGOGASTRODUODENOSCOPY  06/30/2012   per Dr. Jakie, reflux but no Barretts seen    HARDWARE REMOVAL Right 02/01/2020   Procedure: HARDWARE REMOVAL;  Surgeon: Vernetta Lonni GRADE, MD;  Location:  [facility];  Service: Orthopedics;  Laterality: Right;   INTRAMEDULLARY (IM) NAIL INTERTROCHANTERIC Right 06/22/2019   INTRAMEDULLARY (IM) NAIL INTERTROCHANTERIC Right 06/22/2019   Procedure: INTRAMEDULLARY (IM) NAIL INTERTROCHANTRIC;  Surgeon: Celena Sharper, MD;  Location: [facility];  Service: Orthopedics;  Laterality: Right;   MASTECTOMY W/ SENTINEL NODE BIOPSY Right 09/20/2022   Procedure: RIGHT MASTECTOMY WITH SENTINEL LYMPH NODE BIOPSY;  Surgeon: Curvin Deward MOULD, MD;  Location: [facility];  Service: General;  Laterality: Right;   POLYPECTOMY     REVERSE SHOULDER ARTHROPLASTY Right 01/29/2020   Procedure: RIGHT REVERSE SHOULDER ARTHROPLASTY;  Surgeon: Josefina Chew, MD;  Location: [facility];  Service: Orthopedics;  Laterality: Right;   TOTAL HIP ARTHROPLASTY Right 02/01/2020   Procedure: TOTAL HIP ARTHROPLASTY ANTERIOR APPROACH;  Surgeon: Vernetta Lonni GRADE, MD;  Location: [facility];  Service: Orthopedics;  Laterality: Right;    Family History  Problem Relation Age of Onset   Crohn's disease Father    Colon polyps Father    Breast cancer Maternal Grandmother    Stroke Paternal Grandfather    Colon cancer Neg Hx    Esophageal cancer Neg Hx    Rectal cancer Neg Hx    Stomach cancer Neg Hx      Current Outpatient Medications:    alendronate  (FOSAMAX ) 70 MG tablet, Take 1 tablet (70 mg total) by mouth once a week. Take with a full glass of water  on  an empty stomach., Disp: 50 tablet, Rfl: 0   anastrozole  (ARIMIDEX ) 1 MG tablet, Take 1 tablet (1 mg total) by mouth daily., Disp: 90 tablet, Rfl: 3   buPROPion  (WELLBUTRIN  XL) 300 MG 24 hr tablet, TAKE 1 TABLET(300 MG) BY MOUTH EVERY MORNING (Patient taking differently: Take 300 mg by mouth daily.), Disp: 90 tablet, Rfl: 3   busPIRone  (BUSPAR ) 15 MG tablet, TAKE 1 TABLET(15 MG) BY MOUTH TWICE DAILY (Patient taking differently: Take 7.5 mg by mouth daily.), Disp: 60 tablet, Rfl: 1   cetirizine  (ZYRTEC ) 10 MG tablet, TAKE 1 TABLET(10  MG) BY MOUTH DAILY (Patient taking differently: Take 10 mg by mouth daily as needed for allergies or rhinitis.), Disp: 90 tablet, Rfl: 3   cholecalciferol  (VITAMIN D3) 10 MCG (400 UNIT) TABS tablet, Take 2 tablets (800 Units total) by mouth daily., Disp: 30 tablet, Rfl: 0   EPINEPHrine  0.3 mg/0.3 mL IJ SOAJ injection, Inject 0.3 mLs (0.3 mg total) into the muscle daily as needed for anaphylaxis., Disp: 1 each, Rfl: 0   furosemide  (LASIX ) 20 MG tablet, Take 1 tablet (20 mg total) by mouth every morning., Disp: 90 tablet, Rfl: 3   ibuprofen (ADVIL) 200 MG tablet, Take 200-400 mg by mouth as needed for headache or moderate pain., Disp: , Rfl:    meloxicam  (MOBIC ) 15 MG tablet, TAKE 1 TABLET(15 MG) BY MOUTH DAILY (Patient taking differently: Take 15 mg by mouth as needed for pain.), Disp: 90 tablet, Rfl: 0   omeprazole  (PRILOSEC) 40 MG capsule, Take 1 capsule (40 mg total) by mouth 2 (two) times daily. (Patient taking differently: Take 40 mg by mouth daily.), Disp: 180 capsule, Rfl: 3   ondansetron  (ZOFRAN ) 4 MG tablet, Take 1 tablet (4 mg total) by mouth every 8 (eight) hours as needed for nausea or vomiting. (Patient taking differently: Take 4 mg by mouth as needed for nausea or vomiting.), Disp: 10 tablet, Rfl: 0   oxybutynin  (DITROPAN -XL) 10 MG 24 hr tablet, Take 1 tablet (10 mg total) by mouth at bedtime., Disp: 90 tablet, Rfl: 3   oxyCODONE  (OXY IR/ROXICODONE ) 5 MG immediate release tablet, Take 1 tablet (5 mg total) by mouth every 6 (six) hours as needed for moderate pain or severe pain., Disp: 120 tablet, Rfl: 0   sennosides-docusate sodium  (SENOKOT-S) 8.6-50 MG tablet, Take 1-2 tablets by mouth as needed for constipation., Disp: , Rfl:    tobramycin -dexamethasone  (TOBRADEX ) ophthalmic solution, Place 2 drops into both eyes every 4 (four) hours while awake., Disp: 5 mL, Rfl: 1  EXAM:  VITALS per patient if applicable:  GENERAL: alert, oriented, appears well and in no acute distress  HEENT:  atraumatic, conjunttiva clear, no obvious abnormalities on inspection of external nose and ears  NECK: normal movements of the head and neck  LUNGS: on inspection no signs of respiratory distress, breathing rate appears normal, no obvious gross SOB, gasping or wheezing  CV: no obvious cyanosis  MS: moves all visible extremities without noticeable abnormality  PSYCH/NEURO: pleasant and cooperative, no obvious depression or anxiety, speech and thought processing grossly intact  ASSESSMENT AND PLAN: Due to OA and frequent falls. We will refer her to PT and OT per [facility]. Garnette Olmsted, MD  Discussed the following assessment and plan:  No diagnosis found.     I discussed the assessment and treatment plan with the patient. The patient was provided an opportunity to ask questions and all were answered. The patient agreed with the plan and demonstrated an  understanding of the instructions.   The patient was advised to call back or seek an in-person evaluation if the symptoms worsen or if the condition fails to improve as anticipated.      Review of Systems     Objective:   Physical Exam        Assessment & Plan:

## 2024-11-06 NOTE — Telephone Encounter (Signed)
 Copied from CRM #[id number]. Topic: General - Other >> Nov 06, 2024  2:17 PM Mercedes MATSU wrote: Reason for CRM: Patient states she missed Dr Mable call and is requesting he call her back. She said she is so sorry for missing it but she really wants to speak with him directly. Can be reached at [phone].

## 2024-11-06 NOTE — Telephone Encounter (Signed)
 Attempted to call Emily Horton with [facility] Several times with no success. Left a message but no return call.

## 2024-11-07 ENCOUNTER — Telehealth: Payer: Self-pay | Admitting: Family Medicine

## 2024-11-07 NOTE — Telephone Encounter (Signed)
 Copied from CRM [id number]. Topic: General - Other >> Nov 07, 2024  1:24 PM Eva FALCON wrote: Reason for CRM: Pearson from [facility] is calling in to see if we received a fax on 11/24 for order #[id number]. States Dr. Johnny needs to sign and fax this back to [facility] so they can close this patients chart. Pearson also states he will refax the order. Any further questions or concerns please reach Ramon at [phone].

## 2024-11-08 NOTE — Telephone Encounter (Signed)
 Third attempt to reach Emily Horton with [facility] detailed message regarding VO approval

## 2024-11-20 NOTE — Telephone Encounter (Signed)
 Attempted to call Pearson back, no success speaking with an agent, will try call back later

## 2024-11-21 ENCOUNTER — Telehealth: Payer: Self-pay | Admitting: *Deleted

## 2024-11-21 NOTE — Telephone Encounter (Signed)
 Pt order #[id number] was received competed and faxed back to [facility]

## 2024-11-21 NOTE — Telephone Encounter (Signed)
 Left detailed message for Emily Horton advised that VO requested have been approved by Dr Johnny.

## 2024-11-21 NOTE — Telephone Encounter (Signed)
 Copied from CRM #[id number]. Topic: Clinical - Home Health Verbal Orders >> Nov 21, 2024  1:44 PM Robinson DEL wrote: Caller/Agency: [facility] Number: [phone] Service Requested: Physical Therapy Frequency: 1 week 3 Any new concerns about the patient? No

## 2024-11-26 NOTE — Telephone Encounter (Signed)
 Spoke with Maranda with [facility] advised that Dr Johnny approved VO requested
